# Patient Record
Sex: Male | Born: 1960
Health system: Southern US, Community
[De-identification: ages and names within clinical notes are randomized; demographics above are authoritative.]

## PROBLEM LIST (undated history)

## (undated) DIAGNOSIS — M1990 Unspecified osteoarthritis, unspecified site: Secondary | ICD-10-CM

## (undated) DIAGNOSIS — K219 Gastro-esophageal reflux disease without esophagitis: Secondary | ICD-10-CM

## (undated) DIAGNOSIS — E119 Type 2 diabetes mellitus without complications: Secondary | ICD-10-CM

## (undated) DIAGNOSIS — R7303 Prediabetes: Secondary | ICD-10-CM

## (undated) DIAGNOSIS — Z8701 Personal history of pneumonia (recurrent): Secondary | ICD-10-CM

## (undated) DIAGNOSIS — I10 Essential (primary) hypertension: Secondary | ICD-10-CM

## (undated) DIAGNOSIS — J189 Pneumonia, unspecified organism: Secondary | ICD-10-CM

## (undated) DIAGNOSIS — B1920 Unspecified viral hepatitis C without hepatic coma: Secondary | ICD-10-CM

## (undated) DIAGNOSIS — E039 Hypothyroidism, unspecified: Secondary | ICD-10-CM

## (undated) DIAGNOSIS — K449 Diaphragmatic hernia without obstruction or gangrene: Secondary | ICD-10-CM

## (undated) DIAGNOSIS — F1121 Opioid dependence, in remission: Secondary | ICD-10-CM

## (undated) DIAGNOSIS — I499 Cardiac arrhythmia, unspecified: Secondary | ICD-10-CM

## (undated) DIAGNOSIS — R011 Cardiac murmur, unspecified: Secondary | ICD-10-CM

## (undated) DIAGNOSIS — K7460 Unspecified cirrhosis of liver: Secondary | ICD-10-CM

## (undated) DIAGNOSIS — G629 Polyneuropathy, unspecified: Secondary | ICD-10-CM

## (undated) DIAGNOSIS — R0609 Other forms of dyspnea: Secondary | ICD-10-CM

## (undated) DIAGNOSIS — F1911 Other psychoactive substance abuse, in remission: Secondary | ICD-10-CM

## (undated) DIAGNOSIS — K7682 Hepatic encephalopathy: Secondary | ICD-10-CM

## (undated) DIAGNOSIS — F329 Major depressive disorder, single episode, unspecified: Secondary | ICD-10-CM

## (undated) DIAGNOSIS — K279 Peptic ulcer, site unspecified, unspecified as acute or chronic, without hemorrhage or perforation: Secondary | ICD-10-CM

## (undated) DIAGNOSIS — G709 Myoneural disorder, unspecified: Secondary | ICD-10-CM

## (undated) DIAGNOSIS — G894 Chronic pain syndrome: Secondary | ICD-10-CM

## (undated) DIAGNOSIS — F32A Depression, unspecified: Secondary | ICD-10-CM

## (undated) DIAGNOSIS — G4730 Sleep apnea, unspecified: Secondary | ICD-10-CM

## (undated) DIAGNOSIS — N281 Cyst of kidney, acquired: Secondary | ICD-10-CM

## (undated) DIAGNOSIS — Z9889 Other specified postprocedural states: Secondary | ICD-10-CM

## (undated) DIAGNOSIS — R161 Splenomegaly, not elsewhere classified: Secondary | ICD-10-CM

## (undated) DIAGNOSIS — R0600 Dyspnea, unspecified: Secondary | ICD-10-CM

## (undated) DIAGNOSIS — F4310 Post-traumatic stress disorder, unspecified: Secondary | ICD-10-CM

## (undated) DIAGNOSIS — Z789 Other specified health status: Secondary | ICD-10-CM

## (undated) DIAGNOSIS — F419 Anxiety disorder, unspecified: Secondary | ICD-10-CM

## (undated) DIAGNOSIS — K769 Liver disease, unspecified: Secondary | ICD-10-CM

## (undated) HISTORY — DX: Liver disease, unspecified: K76.9

## (undated) HISTORY — DX: Unspecified viral hepatitis C without hepatic coma: B19.20

## (undated) HISTORY — PX: OTHER SURGICAL HISTORY: SHX169

## (undated) HISTORY — DX: Personal history of pneumonia (recurrent): Z87.01

## (undated) HISTORY — DX: Other specified postprocedural states: Z98.890

## (undated) HISTORY — DX: Type 2 diabetes mellitus without complications: E11.9

## (undated) HISTORY — DX: Cardiac murmur, unspecified: R01.1

---

## 2002-03-10 ENCOUNTER — Emergency Department (HOSPITAL_COMMUNITY): Admission: EM | Admit: 2002-03-10 | Discharge: 2002-03-10 | Payer: Self-pay | Admitting: *Deleted

## 2002-03-18 ENCOUNTER — Ambulatory Visit (HOSPITAL_COMMUNITY): Admission: RE | Admit: 2002-03-18 | Discharge: 2002-03-18 | Payer: Self-pay | Admitting: Internal Medicine

## 2002-03-18 ENCOUNTER — Encounter: Payer: Self-pay | Admitting: Internal Medicine

## 2004-01-07 ENCOUNTER — Emergency Department (HOSPITAL_COMMUNITY): Admission: EM | Admit: 2004-01-07 | Discharge: 2004-01-07 | Payer: Self-pay | Admitting: Internal Medicine

## 2005-04-01 DIAGNOSIS — K219 Gastro-esophageal reflux disease without esophagitis: Secondary | ICD-10-CM | POA: Insufficient documentation

## 2005-04-04 ENCOUNTER — Ambulatory Visit: Payer: Self-pay | Admitting: Unknown Physician Specialty

## 2006-01-12 ENCOUNTER — Emergency Department: Payer: Self-pay | Admitting: General Practice

## 2006-02-21 ENCOUNTER — Ambulatory Visit: Payer: Self-pay | Admitting: Family Medicine

## 2006-05-16 ENCOUNTER — Other Ambulatory Visit: Payer: Self-pay

## 2006-05-16 ENCOUNTER — Inpatient Hospital Stay: Payer: Self-pay | Admitting: Internal Medicine

## 2006-05-29 ENCOUNTER — Ambulatory Visit: Payer: Self-pay | Admitting: Family Medicine

## 2006-06-02 ENCOUNTER — Ambulatory Visit: Payer: Self-pay | Admitting: Gastroenterology

## 2006-06-27 ENCOUNTER — Ambulatory Visit: Payer: Self-pay | Admitting: Cardiology

## 2007-06-10 DIAGNOSIS — G478 Other sleep disorders: Secondary | ICD-10-CM | POA: Insufficient documentation

## 2007-06-12 ENCOUNTER — Ambulatory Visit: Payer: Self-pay | Admitting: Family Medicine

## 2007-10-09 ENCOUNTER — Ambulatory Visit: Payer: Self-pay | Admitting: Family Medicine

## 2007-11-01 ENCOUNTER — Other Ambulatory Visit: Payer: Self-pay

## 2007-11-01 ENCOUNTER — Inpatient Hospital Stay: Payer: Self-pay | Admitting: Internal Medicine

## 2007-11-11 DIAGNOSIS — Z8701 Personal history of pneumonia (recurrent): Secondary | ICD-10-CM

## 2007-11-11 HISTORY — DX: Personal history of pneumonia (recurrent): Z87.01

## 2008-01-18 ENCOUNTER — Emergency Department: Payer: Self-pay | Admitting: Emergency Medicine

## 2008-01-22 ENCOUNTER — Ambulatory Visit: Payer: Self-pay | Admitting: Family Medicine

## 2008-02-24 ENCOUNTER — Ambulatory Visit: Payer: Self-pay

## 2008-07-10 ENCOUNTER — Other Ambulatory Visit: Payer: Self-pay

## 2008-07-10 ENCOUNTER — Observation Stay: Payer: Self-pay | Admitting: Internal Medicine

## 2008-08-11 ENCOUNTER — Encounter: Admission: RE | Admit: 2008-08-11 | Discharge: 2008-08-11 | Payer: Self-pay | Admitting: Neurology

## 2008-08-28 ENCOUNTER — Emergency Department: Payer: Self-pay | Admitting: Emergency Medicine

## 2008-08-28 ENCOUNTER — Other Ambulatory Visit: Payer: Self-pay

## 2008-12-25 ENCOUNTER — Emergency Department: Payer: Self-pay | Admitting: Emergency Medicine

## 2009-01-01 ENCOUNTER — Emergency Department: Payer: Self-pay | Admitting: Emergency Medicine

## 2009-02-16 ENCOUNTER — Ambulatory Visit: Payer: Self-pay | Admitting: Family Medicine

## 2009-02-22 ENCOUNTER — Ambulatory Visit: Payer: Self-pay | Admitting: Unknown Physician Specialty

## 2009-02-22 ENCOUNTER — Ambulatory Visit: Payer: Self-pay

## 2009-03-21 DIAGNOSIS — R197 Diarrhea, unspecified: Secondary | ICD-10-CM | POA: Insufficient documentation

## 2009-05-30 ENCOUNTER — Emergency Department: Payer: Self-pay | Admitting: Emergency Medicine

## 2010-06-14 ENCOUNTER — Ambulatory Visit: Payer: Self-pay | Admitting: Family Medicine

## 2010-07-12 ENCOUNTER — Emergency Department: Payer: Self-pay | Admitting: Emergency Medicine

## 2011-05-01 ENCOUNTER — Ambulatory Visit: Payer: Self-pay | Admitting: Family Medicine

## 2011-06-27 ENCOUNTER — Emergency Department: Payer: Self-pay

## 2011-06-29 ENCOUNTER — Emergency Department: Payer: Self-pay

## 2011-11-07 ENCOUNTER — Ambulatory Visit: Payer: Self-pay | Admitting: Family Medicine

## 2011-12-04 ENCOUNTER — Emergency Department: Payer: Self-pay | Admitting: Gastroenterology

## 2011-12-04 DIAGNOSIS — T17208A Unspecified foreign body in pharynx causing other injury, initial encounter: Secondary | ICD-10-CM | POA: Diagnosis not present

## 2011-12-04 DIAGNOSIS — R112 Nausea with vomiting, unspecified: Secondary | ICD-10-CM | POA: Diagnosis not present

## 2011-12-04 DIAGNOSIS — T18108A Unspecified foreign body in esophagus causing other injury, initial encounter: Secondary | ICD-10-CM | POA: Diagnosis not present

## 2011-12-04 DIAGNOSIS — R6889 Other general symptoms and signs: Secondary | ICD-10-CM | POA: Diagnosis not present

## 2011-12-04 LAB — COMPREHENSIVE METABOLIC PANEL
Albumin: 4.1 g/dL (ref 3.4–5.0)
Alkaline Phosphatase: 127 U/L (ref 50–136)
Anion Gap: 11 (ref 7–16)
BUN: 12 mg/dL (ref 7–18)
Bilirubin,Total: 0.6 mg/dL (ref 0.2–1.0)
Calcium, Total: 9 mg/dL (ref 8.5–10.1)
Chloride: 101 mmol/L (ref 98–107)
Co2: 26 mmol/L (ref 21–32)
Creatinine: 0.94 mg/dL (ref 0.60–1.30)
EGFR (African American): >60
EGFR (Non-African Amer.): >60
Glucose: 154 mg/dL — ABNORMAL HIGH (ref 65–99)
Osmolality: 279 (ref 275–301)
Potassium: 4.1 mmol/L (ref 3.5–5.1)
SGOT(AST): 107 U/L — ABNORMAL HIGH (ref 15–37)
SGPT (ALT): 115 U/L — ABNORMAL HIGH
Sodium: 138 mmol/L (ref 136–145)
Total Protein: 7.8 g/dL (ref 6.4–8.2)

## 2011-12-04 LAB — CBC
HCT: 42.3 % (ref 40.0–52.0)
HGB: 14.5 g/dL (ref 13.0–18.0)
MCH: 31.8 pg (ref 26.0–34.0)
MCHC: 34.4 g/dL (ref 32.0–36.0)
MCV: 92 fL (ref 80–100)
Platelet: 157 10*3/uL (ref 150–440)
RBC: 4.58 10*6/uL (ref 4.40–5.90)
RDW: 12.7 % (ref 11.5–14.5)
WBC: 6.1 10*3/uL (ref 3.8–10.6)

## 2011-12-06 DIAGNOSIS — E876 Hypokalemia: Secondary | ICD-10-CM | POA: Diagnosis not present

## 2011-12-06 DIAGNOSIS — R1310 Dysphagia, unspecified: Secondary | ICD-10-CM | POA: Diagnosis not present

## 2011-12-06 DIAGNOSIS — Z23 Encounter for immunization: Secondary | ICD-10-CM | POA: Diagnosis not present

## 2011-12-09 DIAGNOSIS — E876 Hypokalemia: Secondary | ICD-10-CM | POA: Diagnosis not present

## 2012-01-13 DIAGNOSIS — R1013 Epigastric pain: Secondary | ICD-10-CM | POA: Diagnosis not present

## 2012-01-13 DIAGNOSIS — R1011 Right upper quadrant pain: Secondary | ICD-10-CM | POA: Diagnosis not present

## 2012-01-13 DIAGNOSIS — R1310 Dysphagia, unspecified: Secondary | ICD-10-CM | POA: Diagnosis not present

## 2012-01-13 DIAGNOSIS — R1012 Left upper quadrant pain: Secondary | ICD-10-CM | POA: Diagnosis not present

## 2012-01-14 DIAGNOSIS — R1011 Right upper quadrant pain: Secondary | ICD-10-CM | POA: Diagnosis not present

## 2012-01-14 DIAGNOSIS — R634 Abnormal weight loss: Secondary | ICD-10-CM | POA: Diagnosis not present

## 2012-01-14 DIAGNOSIS — B182 Chronic viral hepatitis C: Secondary | ICD-10-CM | POA: Diagnosis not present

## 2012-01-14 DIAGNOSIS — R1310 Dysphagia, unspecified: Secondary | ICD-10-CM | POA: Diagnosis not present

## 2012-01-16 ENCOUNTER — Ambulatory Visit: Payer: Self-pay | Admitting: Gastroenterology

## 2012-01-16 DIAGNOSIS — R1011 Right upper quadrant pain: Secondary | ICD-10-CM | POA: Diagnosis not present

## 2012-01-16 DIAGNOSIS — R1310 Dysphagia, unspecified: Secondary | ICD-10-CM | POA: Diagnosis not present

## 2012-01-16 DIAGNOSIS — R1012 Left upper quadrant pain: Secondary | ICD-10-CM | POA: Diagnosis not present

## 2012-01-16 DIAGNOSIS — R109 Unspecified abdominal pain: Secondary | ICD-10-CM | POA: Diagnosis not present

## 2012-01-16 DIAGNOSIS — R1013 Epigastric pain: Secondary | ICD-10-CM | POA: Diagnosis not present

## 2012-01-16 DIAGNOSIS — R634 Abnormal weight loss: Secondary | ICD-10-CM | POA: Diagnosis not present

## 2012-01-16 DIAGNOSIS — B182 Chronic viral hepatitis C: Secondary | ICD-10-CM | POA: Diagnosis not present

## 2012-02-13 ENCOUNTER — Ambulatory Visit: Payer: Self-pay | Admitting: Gastroenterology

## 2012-02-13 DIAGNOSIS — Z8619 Personal history of other infectious and parasitic diseases: Secondary | ICD-10-CM | POA: Diagnosis not present

## 2012-02-13 DIAGNOSIS — G9009 Other idiopathic peripheral autonomic neuropathy: Secondary | ICD-10-CM | POA: Diagnosis not present

## 2012-02-13 DIAGNOSIS — R1310 Dysphagia, unspecified: Secondary | ICD-10-CM | POA: Diagnosis not present

## 2012-02-13 DIAGNOSIS — Z801 Family history of malignant neoplasm of trachea, bronchus and lung: Secondary | ICD-10-CM | POA: Diagnosis not present

## 2012-02-13 DIAGNOSIS — Z79899 Other long term (current) drug therapy: Secondary | ICD-10-CM | POA: Diagnosis not present

## 2012-02-13 DIAGNOSIS — R079 Chest pain, unspecified: Secondary | ICD-10-CM | POA: Diagnosis not present

## 2012-02-13 DIAGNOSIS — R7309 Other abnormal glucose: Secondary | ICD-10-CM | POA: Diagnosis not present

## 2012-02-13 DIAGNOSIS — R1011 Right upper quadrant pain: Secondary | ICD-10-CM | POA: Diagnosis not present

## 2012-02-13 DIAGNOSIS — R1084 Generalized abdominal pain: Secondary | ICD-10-CM | POA: Diagnosis not present

## 2012-02-13 DIAGNOSIS — I10 Essential (primary) hypertension: Secondary | ICD-10-CM | POA: Diagnosis not present

## 2012-02-13 DIAGNOSIS — R1012 Left upper quadrant pain: Secondary | ICD-10-CM | POA: Diagnosis not present

## 2012-02-13 DIAGNOSIS — K219 Gastro-esophageal reflux disease without esophagitis: Secondary | ICD-10-CM | POA: Diagnosis not present

## 2012-02-13 DIAGNOSIS — K222 Esophageal obstruction: Secondary | ICD-10-CM | POA: Diagnosis not present

## 2012-02-18 ENCOUNTER — Ambulatory Visit: Payer: Self-pay | Admitting: Gastroenterology

## 2012-02-18 DIAGNOSIS — E119 Type 2 diabetes mellitus without complications: Secondary | ICD-10-CM | POA: Diagnosis not present

## 2012-02-18 DIAGNOSIS — R1310 Dysphagia, unspecified: Secondary | ICD-10-CM | POA: Diagnosis not present

## 2012-02-18 DIAGNOSIS — M5000 Cervical disc disorder with myelopathy, unspecified cervical region: Secondary | ICD-10-CM | POA: Diagnosis not present

## 2012-02-18 DIAGNOSIS — J309 Allergic rhinitis, unspecified: Secondary | ICD-10-CM | POA: Diagnosis not present

## 2012-02-18 DIAGNOSIS — B182 Chronic viral hepatitis C: Secondary | ICD-10-CM | POA: Diagnosis not present

## 2012-02-18 DIAGNOSIS — Z801 Family history of malignant neoplasm of trachea, bronchus and lung: Secondary | ICD-10-CM | POA: Diagnosis not present

## 2012-02-18 DIAGNOSIS — G9009 Other idiopathic peripheral autonomic neuropathy: Secondary | ICD-10-CM | POA: Diagnosis not present

## 2012-02-18 DIAGNOSIS — B1920 Unspecified viral hepatitis C without hepatic coma: Secondary | ICD-10-CM | POA: Diagnosis not present

## 2012-02-18 DIAGNOSIS — M5106 Intervertebral disc disorders with myelopathy, lumbar region: Secondary | ICD-10-CM | POA: Diagnosis not present

## 2012-02-18 DIAGNOSIS — K222 Esophageal obstruction: Secondary | ICD-10-CM | POA: Diagnosis not present

## 2012-02-18 DIAGNOSIS — R634 Abnormal weight loss: Secondary | ICD-10-CM | POA: Diagnosis not present

## 2012-02-18 DIAGNOSIS — G4700 Insomnia, unspecified: Secondary | ICD-10-CM | POA: Diagnosis not present

## 2012-02-18 DIAGNOSIS — G894 Chronic pain syndrome: Secondary | ICD-10-CM | POA: Diagnosis not present

## 2012-02-18 DIAGNOSIS — I10 Essential (primary) hypertension: Secondary | ICD-10-CM | POA: Diagnosis not present

## 2012-02-18 LAB — PROTIME-INR
INR: 1
Prothrombin Time: 13.4 secs (ref 11.5–14.7)

## 2012-02-18 LAB — PLATELET COUNT: Platelet: 152 10*3/uL (ref 150–440)

## 2012-02-18 LAB — APTT: Activated PTT: 29.6 secs (ref 23.6–35.9)

## 2012-02-25 LAB — PATHOLOGY REPORT

## 2012-03-03 DIAGNOSIS — F319 Bipolar disorder, unspecified: Secondary | ICD-10-CM | POA: Diagnosis not present

## 2012-03-10 DIAGNOSIS — M549 Dorsalgia, unspecified: Secondary | ICD-10-CM | POA: Diagnosis not present

## 2012-03-10 DIAGNOSIS — Z23 Encounter for immunization: Secondary | ICD-10-CM | POA: Diagnosis not present

## 2012-03-10 DIAGNOSIS — K5289 Other specified noninfective gastroenteritis and colitis: Secondary | ICD-10-CM | POA: Diagnosis not present

## 2012-05-22 ENCOUNTER — Ambulatory Visit: Payer: Self-pay | Admitting: Family Medicine

## 2012-05-22 DIAGNOSIS — Z23 Encounter for immunization: Secondary | ICD-10-CM | POA: Diagnosis not present

## 2012-05-22 DIAGNOSIS — L821 Other seborrheic keratosis: Secondary | ICD-10-CM | POA: Diagnosis not present

## 2012-05-22 DIAGNOSIS — R634 Abnormal weight loss: Secondary | ICD-10-CM | POA: Diagnosis not present

## 2012-05-22 LAB — COMPREHENSIVE METABOLIC PANEL
Albumin: 4.1 g/dL (ref 3.4–5.0)
Alkaline Phosphatase: 134 U/L (ref 50–136)
Anion Gap: 9 (ref 7–16)
BUN: 11 mg/dL (ref 7–18)
Calcium, Total: 9.1 mg/dL (ref 8.5–10.1)
Chloride: 104 mmol/L (ref 98–107)
Co2: 24 mmol/L (ref 21–32)
Creatinine: 1.05 mg/dL (ref 0.60–1.30)
Potassium: 3.8 mmol/L (ref 3.5–5.1)
SGOT(AST): 62 U/L — ABNORMAL HIGH (ref 15–37)
SGPT (ALT): 63 U/L
Sodium: 137 mmol/L (ref 136–145)

## 2012-05-22 LAB — CBC WITH DIFFERENTIAL/PLATELET
Eosinophil %: 1.8 %
HCT: 42 % (ref 40.0–52.0)
HGB: 14.6 g/dL (ref 13.0–18.0)
Lymphocyte %: 36.7 %
Monocyte #: 0.4 x10 3/mm (ref 0.2–1.0)
Monocyte %: 6 %
Neutrophil %: 54.6 %
Platelet: 159 10*3/uL (ref 150–440)
RBC: 4.72 10*6/uL (ref 4.40–5.90)
RDW: 13 % (ref 11.5–14.5)

## 2012-05-22 LAB — TSH: Thyroid Stimulating Horm: 2.12 u[IU]/mL

## 2012-06-11 ENCOUNTER — Ambulatory Visit: Payer: Self-pay | Admitting: Family Medicine

## 2012-06-11 DIAGNOSIS — M25569 Pain in unspecified knee: Secondary | ICD-10-CM | POA: Diagnosis not present

## 2012-06-11 DIAGNOSIS — G894 Chronic pain syndrome: Secondary | ICD-10-CM | POA: Diagnosis not present

## 2012-06-11 DIAGNOSIS — K4090 Unilateral inguinal hernia, without obstruction or gangrene, not specified as recurrent: Secondary | ICD-10-CM | POA: Diagnosis not present

## 2012-06-11 DIAGNOSIS — Z23 Encounter for immunization: Secondary | ICD-10-CM | POA: Diagnosis not present

## 2012-07-09 DIAGNOSIS — M2240 Chondromalacia patellae, unspecified knee: Secondary | ICD-10-CM | POA: Diagnosis not present

## 2012-07-22 DIAGNOSIS — K4090 Unilateral inguinal hernia, without obstruction or gangrene, not specified as recurrent: Secondary | ICD-10-CM | POA: Diagnosis not present

## 2012-07-30 ENCOUNTER — Ambulatory Visit: Payer: Self-pay | Admitting: General Surgery

## 2012-07-30 DIAGNOSIS — Z01812 Encounter for preprocedural laboratory examination: Secondary | ICD-10-CM | POA: Diagnosis not present

## 2012-07-30 DIAGNOSIS — K4090 Unilateral inguinal hernia, without obstruction or gangrene, not specified as recurrent: Secondary | ICD-10-CM | POA: Diagnosis not present

## 2012-07-30 LAB — COMPREHENSIVE METABOLIC PANEL
Albumin: 3.5 g/dL (ref 3.4–5.0)
Alkaline Phosphatase: 150 U/L — ABNORMAL HIGH (ref 50–136)
Anion Gap: 4 — ABNORMAL LOW (ref 7–16)
BUN: 8 mg/dL (ref 7–18)
Bilirubin,Total: 0.4 mg/dL (ref 0.2–1.0)
Calcium, Total: 8.8 mg/dL (ref 8.5–10.1)
Chloride: 103 mmol/L (ref 98–107)
Co2: 30 mmol/L (ref 21–32)
Creatinine: 0.8 mg/dL (ref 0.60–1.30)
EGFR (African American): >60
EGFR (Non-African Amer.): >60
Glucose: 117 mg/dL — ABNORMAL HIGH (ref 65–99)
Osmolality: 273 (ref 275–301)
Potassium: 4.1 mmol/L (ref 3.5–5.1)
SGOT(AST): 102 U/L — ABNORMAL HIGH (ref 15–37)
SGPT (ALT): 91 U/L — ABNORMAL HIGH (ref 12–78)
Sodium: 137 mmol/L (ref 136–145)
Total Protein: 7.5 g/dL (ref 6.4–8.2)

## 2012-07-30 LAB — CBC WITH DIFFERENTIAL/PLATELET
Basophil #: 0 10*3/uL (ref 0.0–0.1)
Basophil %: 1.1 %
Eosinophil #: 0.2 10*3/uL (ref 0.0–0.7)
Eosinophil %: 5.1 %
HCT: 39.3 % — ABNORMAL LOW (ref 40.0–52.0)
HGB: 13.9 g/dL (ref 13.0–18.0)
Lymphocyte #: 1.6 10*3/uL (ref 1.0–3.6)
Lymphocyte %: 39.3 %
MCH: 31.9 pg (ref 26.0–34.0)
MCHC: 35.5 g/dL (ref 32.0–36.0)
MCV: 90 fL (ref 80–100)
Monocyte #: 0.4 x10 3/mm (ref 0.2–1.0)
Monocyte %: 9.9 %
Neutrophil #: 1.8 10*3/uL (ref 1.4–6.5)
Neutrophil %: 44.6 %
Platelet: 125 10*3/uL — ABNORMAL LOW (ref 150–440)
RBC: 4.36 10*6/uL — ABNORMAL LOW (ref 4.40–5.90)
RDW: 13 % (ref 11.5–14.5)
WBC: 4.1 10*3/uL (ref 3.8–10.6)

## 2012-08-02 HISTORY — PX: HERNIA REPAIR: SHX51

## 2012-08-06 ENCOUNTER — Ambulatory Visit: Payer: Self-pay | Admitting: General Surgery

## 2012-08-06 DIAGNOSIS — G43909 Migraine, unspecified, not intractable, without status migrainosus: Secondary | ICD-10-CM | POA: Diagnosis not present

## 2012-08-06 DIAGNOSIS — R160 Hepatomegaly, not elsewhere classified: Secondary | ICD-10-CM | POA: Diagnosis not present

## 2012-08-06 DIAGNOSIS — R000 Tachycardia, unspecified: Secondary | ICD-10-CM | POA: Diagnosis not present

## 2012-08-06 DIAGNOSIS — M129 Arthropathy, unspecified: Secondary | ICD-10-CM | POA: Diagnosis not present

## 2012-08-06 DIAGNOSIS — Z79899 Other long term (current) drug therapy: Secondary | ICD-10-CM | POA: Diagnosis not present

## 2012-08-06 DIAGNOSIS — K4090 Unilateral inguinal hernia, without obstruction or gangrene, not specified as recurrent: Secondary | ICD-10-CM | POA: Diagnosis not present

## 2012-08-06 DIAGNOSIS — K219 Gastro-esophageal reflux disease without esophagitis: Secondary | ICD-10-CM | POA: Diagnosis not present

## 2012-08-06 DIAGNOSIS — E119 Type 2 diabetes mellitus without complications: Secondary | ICD-10-CM | POA: Diagnosis not present

## 2012-08-06 DIAGNOSIS — IMO0002 Reserved for concepts with insufficient information to code with codable children: Secondary | ICD-10-CM | POA: Diagnosis not present

## 2012-08-06 DIAGNOSIS — I10 Essential (primary) hypertension: Secondary | ICD-10-CM | POA: Diagnosis not present

## 2012-08-06 DIAGNOSIS — G609 Hereditary and idiopathic neuropathy, unspecified: Secondary | ICD-10-CM | POA: Diagnosis not present

## 2012-08-06 DIAGNOSIS — Z8619 Personal history of other infectious and parasitic diseases: Secondary | ICD-10-CM | POA: Diagnosis not present

## 2012-08-06 DIAGNOSIS — D176 Benign lipomatous neoplasm of spermatic cord: Secondary | ICD-10-CM | POA: Diagnosis not present

## 2012-08-12 DIAGNOSIS — Z23 Encounter for immunization: Secondary | ICD-10-CM | POA: Diagnosis not present

## 2012-08-12 DIAGNOSIS — M25569 Pain in unspecified knee: Secondary | ICD-10-CM | POA: Diagnosis not present

## 2012-08-12 DIAGNOSIS — K4090 Unilateral inguinal hernia, without obstruction or gangrene, not specified as recurrent: Secondary | ICD-10-CM | POA: Diagnosis not present

## 2012-08-12 DIAGNOSIS — G894 Chronic pain syndrome: Secondary | ICD-10-CM | POA: Diagnosis not present

## 2012-09-02 DIAGNOSIS — K4090 Unilateral inguinal hernia, without obstruction or gangrene, not specified as recurrent: Secondary | ICD-10-CM | POA: Diagnosis not present

## 2012-09-02 DIAGNOSIS — R109 Unspecified abdominal pain: Secondary | ICD-10-CM | POA: Diagnosis not present

## 2012-09-02 DIAGNOSIS — M25569 Pain in unspecified knee: Secondary | ICD-10-CM | POA: Diagnosis not present

## 2012-09-02 DIAGNOSIS — Z23 Encounter for immunization: Secondary | ICD-10-CM | POA: Diagnosis not present

## 2012-10-13 DIAGNOSIS — Z23 Encounter for immunization: Secondary | ICD-10-CM | POA: Diagnosis not present

## 2012-10-13 DIAGNOSIS — M519 Unspecified thoracic, thoracolumbar and lumbosacral intervertebral disc disorder: Secondary | ICD-10-CM | POA: Diagnosis not present

## 2012-10-13 DIAGNOSIS — R109 Unspecified abdominal pain: Secondary | ICD-10-CM | POA: Diagnosis not present

## 2012-10-13 DIAGNOSIS — M25569 Pain in unspecified knee: Secondary | ICD-10-CM | POA: Diagnosis not present

## 2013-01-26 DIAGNOSIS — K4090 Unilateral inguinal hernia, without obstruction or gangrene, not specified as recurrent: Secondary | ICD-10-CM | POA: Diagnosis not present

## 2013-01-26 DIAGNOSIS — R945 Abnormal results of liver function studies: Secondary | ICD-10-CM | POA: Diagnosis not present

## 2013-01-26 DIAGNOSIS — E119 Type 2 diabetes mellitus without complications: Secondary | ICD-10-CM | POA: Diagnosis not present

## 2013-01-26 DIAGNOSIS — R7309 Other abnormal glucose: Secondary | ICD-10-CM | POA: Diagnosis not present

## 2013-01-26 DIAGNOSIS — I10 Essential (primary) hypertension: Secondary | ICD-10-CM | POA: Diagnosis not present

## 2013-02-10 DIAGNOSIS — K4091 Unilateral inguinal hernia, without obstruction or gangrene, recurrent: Secondary | ICD-10-CM | POA: Diagnosis not present

## 2013-04-19 DIAGNOSIS — R5383 Other fatigue: Secondary | ICD-10-CM | POA: Diagnosis not present

## 2013-04-19 DIAGNOSIS — R5381 Other malaise: Secondary | ICD-10-CM | POA: Diagnosis not present

## 2013-04-19 DIAGNOSIS — R945 Abnormal results of liver function studies: Secondary | ICD-10-CM | POA: Diagnosis not present

## 2013-04-19 DIAGNOSIS — T148 Other injury of unspecified body region: Secondary | ICD-10-CM | POA: Diagnosis not present

## 2013-04-19 DIAGNOSIS — K4090 Unilateral inguinal hernia, without obstruction or gangrene, not specified as recurrent: Secondary | ICD-10-CM | POA: Diagnosis not present

## 2013-04-19 DIAGNOSIS — W57XXXA Bitten or stung by nonvenomous insect and other nonvenomous arthropods, initial encounter: Secondary | ICD-10-CM | POA: Diagnosis not present

## 2013-05-10 ENCOUNTER — Ambulatory Visit: Payer: Self-pay | Admitting: Family Medicine

## 2013-05-10 DIAGNOSIS — M959 Acquired deformity of musculoskeletal system, unspecified: Secondary | ICD-10-CM | POA: Diagnosis not present

## 2013-05-10 DIAGNOSIS — R0602 Shortness of breath: Secondary | ICD-10-CM | POA: Diagnosis not present

## 2013-05-10 DIAGNOSIS — T148 Other injury of unspecified body region: Secondary | ICD-10-CM | POA: Diagnosis not present

## 2013-05-10 DIAGNOSIS — R61 Generalized hyperhidrosis: Secondary | ICD-10-CM | POA: Diagnosis not present

## 2013-05-10 DIAGNOSIS — R069 Unspecified abnormalities of breathing: Secondary | ICD-10-CM | POA: Diagnosis not present

## 2013-08-06 ENCOUNTER — Emergency Department: Payer: Self-pay | Admitting: Emergency Medicine

## 2013-08-09 ENCOUNTER — Observation Stay: Payer: Self-pay | Admitting: Internal Medicine

## 2013-08-09 DIAGNOSIS — R634 Abnormal weight loss: Secondary | ICD-10-CM | POA: Diagnosis not present

## 2013-08-09 DIAGNOSIS — K7290 Hepatic failure, unspecified without coma: Secondary | ICD-10-CM | POA: Diagnosis not present

## 2013-08-09 DIAGNOSIS — Z79899 Other long term (current) drug therapy: Secondary | ICD-10-CM | POA: Diagnosis not present

## 2013-08-09 DIAGNOSIS — E876 Hypokalemia: Secondary | ICD-10-CM | POA: Diagnosis not present

## 2013-08-09 DIAGNOSIS — R4182 Altered mental status, unspecified: Secondary | ICD-10-CM | POA: Diagnosis not present

## 2013-08-09 DIAGNOSIS — IMO0002 Reserved for concepts with insufficient information to code with codable children: Secondary | ICD-10-CM | POA: Diagnosis not present

## 2013-08-09 DIAGNOSIS — R45851 Suicidal ideations: Secondary | ICD-10-CM | POA: Diagnosis not present

## 2013-08-09 DIAGNOSIS — R404 Transient alteration of awareness: Secondary | ICD-10-CM | POA: Diagnosis not present

## 2013-08-09 DIAGNOSIS — F29 Unspecified psychosis not due to a substance or known physiological condition: Secondary | ICD-10-CM | POA: Diagnosis not present

## 2013-08-09 DIAGNOSIS — F331 Major depressive disorder, recurrent, moderate: Secondary | ICD-10-CM | POA: Diagnosis not present

## 2013-08-09 DIAGNOSIS — E119 Type 2 diabetes mellitus without complications: Secondary | ICD-10-CM | POA: Diagnosis not present

## 2013-08-09 DIAGNOSIS — K7460 Unspecified cirrhosis of liver: Secondary | ICD-10-CM | POA: Diagnosis not present

## 2013-08-09 DIAGNOSIS — G8929 Other chronic pain: Secondary | ICD-10-CM | POA: Diagnosis not present

## 2013-08-09 DIAGNOSIS — I10 Essential (primary) hypertension: Secondary | ICD-10-CM | POA: Diagnosis not present

## 2013-08-09 DIAGNOSIS — Z9119 Patient's noncompliance with other medical treatment and regimen: Secondary | ICD-10-CM | POA: Diagnosis not present

## 2013-08-09 DIAGNOSIS — B182 Chronic viral hepatitis C: Secondary | ICD-10-CM | POA: Diagnosis not present

## 2013-08-09 LAB — URINALYSIS, COMPLETE
Bilirubin,UR: NEGATIVE
Glucose,UR: NEGATIVE mg/dL (ref 0–75)
Hyaline Cast: 5
Ketone: NEGATIVE
Leukocyte Esterase: NEGATIVE
Nitrite: NEGATIVE
Ph: 5 (ref 4.5–8.0)
Specific Gravity: 1.024 (ref 1.003–1.030)
WBC UR: 2 /HPF (ref 0–5)

## 2013-08-09 LAB — COMPREHENSIVE METABOLIC PANEL
Albumin: 3.7 g/dL (ref 3.4–5.0)
Alkaline Phosphatase: 155 U/L — ABNORMAL HIGH (ref 50–136)
BUN: 17 mg/dL (ref 7–18)
Bilirubin,Total: 0.7 mg/dL (ref 0.2–1.0)
Calcium, Total: 8.7 mg/dL (ref 8.5–10.1)
EGFR (Non-African Amer.): >60
Glucose: 117 mg/dL — ABNORMAL HIGH (ref 65–99)
Osmolality: 273 (ref 275–301)
SGOT(AST): 85 U/L — ABNORMAL HIGH (ref 15–37)
SGPT (ALT): 77 U/L (ref 12–78)
Total Protein: 8.3 g/dL — ABNORMAL HIGH (ref 6.4–8.2)

## 2013-08-09 LAB — CBC
HCT: 44.6 % (ref 40.0–52.0)
MCH: 30.9 pg (ref 26.0–34.0)
MCHC: 34.9 g/dL (ref 32.0–36.0)
Platelet: 191 10*3/uL (ref 150–440)
RBC: 5.03 10*6/uL (ref 4.40–5.90)
WBC: 8 10*3/uL (ref 3.8–10.6)

## 2013-08-09 LAB — TROPONIN I: Troponin-I: <0.02 ng/mL

## 2013-08-10 DIAGNOSIS — G8929 Other chronic pain: Secondary | ICD-10-CM | POA: Diagnosis not present

## 2013-08-10 DIAGNOSIS — E876 Hypokalemia: Secondary | ICD-10-CM | POA: Diagnosis not present

## 2013-08-10 DIAGNOSIS — F331 Major depressive disorder, recurrent, moderate: Secondary | ICD-10-CM | POA: Diagnosis not present

## 2013-08-10 DIAGNOSIS — K7290 Hepatic failure, unspecified without coma: Secondary | ICD-10-CM | POA: Diagnosis not present

## 2013-08-10 DIAGNOSIS — I10 Essential (primary) hypertension: Secondary | ICD-10-CM | POA: Diagnosis not present

## 2013-08-10 LAB — CBC WITH DIFFERENTIAL/PLATELET
HGB: 14.4 g/dL (ref 13.0–18.0)
Lymphocyte %: 50.3 %
MCH: 31 pg (ref 26.0–34.0)
MCHC: 34.7 g/dL (ref 32.0–36.0)
Monocyte #: 0.5 x10 3/mm (ref 0.2–1.0)
Monocyte %: 7.9 %
Neutrophil #: 2 10*3/uL (ref 1.4–6.5)
Neutrophil %: 33.6 %
RBC: 4.65 10*6/uL (ref 4.40–5.90)
RDW: 13.2 % (ref 11.5–14.5)
WBC: 6 10*3/uL (ref 3.8–10.6)

## 2013-08-10 LAB — BASIC METABOLIC PANEL
Anion Gap: 8 (ref 7–16)
BUN: 12 mg/dL (ref 7–18)
Calcium, Total: 8.4 mg/dL — ABNORMAL LOW (ref 8.5–10.1)
Co2: 23 mmol/L (ref 21–32)
Osmolality: 274 (ref 275–301)
Potassium: 3.8 mmol/L (ref 3.5–5.1)

## 2013-08-20 DIAGNOSIS — K7290 Hepatic failure, unspecified without coma: Secondary | ICD-10-CM | POA: Diagnosis not present

## 2013-08-20 DIAGNOSIS — R069 Unspecified abnormalities of breathing: Secondary | ICD-10-CM | POA: Diagnosis not present

## 2013-09-30 ENCOUNTER — Ambulatory Visit: Payer: Self-pay | Admitting: Family Medicine

## 2013-09-30 DIAGNOSIS — R069 Unspecified abnormalities of breathing: Secondary | ICD-10-CM | POA: Diagnosis not present

## 2013-09-30 DIAGNOSIS — R109 Unspecified abdominal pain: Secondary | ICD-10-CM | POA: Diagnosis not present

## 2013-10-19 DIAGNOSIS — R109 Unspecified abdominal pain: Secondary | ICD-10-CM | POA: Diagnosis not present

## 2013-10-19 DIAGNOSIS — R069 Unspecified abnormalities of breathing: Secondary | ICD-10-CM | POA: Diagnosis not present

## 2013-10-26 DIAGNOSIS — F319 Bipolar disorder, unspecified: Secondary | ICD-10-CM | POA: Diagnosis not present

## 2013-11-11 ENCOUNTER — Ambulatory Visit: Payer: Self-pay | Admitting: Family Medicine

## 2013-11-11 DIAGNOSIS — Q619 Cystic kidney disease, unspecified: Secondary | ICD-10-CM | POA: Diagnosis not present

## 2013-11-11 DIAGNOSIS — R109 Unspecified abdominal pain: Secondary | ICD-10-CM | POA: Diagnosis not present

## 2013-11-11 DIAGNOSIS — N281 Cyst of kidney, acquired: Secondary | ICD-10-CM | POA: Diagnosis not present

## 2013-11-11 DIAGNOSIS — R161 Splenomegaly, not elsewhere classified: Secondary | ICD-10-CM | POA: Diagnosis not present

## 2013-11-23 DIAGNOSIS — F319 Bipolar disorder, unspecified: Secondary | ICD-10-CM | POA: Diagnosis not present

## 2013-12-17 DIAGNOSIS — J019 Acute sinusitis, unspecified: Secondary | ICD-10-CM | POA: Diagnosis not present

## 2013-12-17 DIAGNOSIS — M2550 Pain in unspecified joint: Secondary | ICD-10-CM | POA: Diagnosis not present

## 2013-12-17 DIAGNOSIS — Z23 Encounter for immunization: Secondary | ICD-10-CM | POA: Diagnosis not present

## 2013-12-17 DIAGNOSIS — R069 Unspecified abnormalities of breathing: Secondary | ICD-10-CM | POA: Diagnosis not present

## 2014-01-28 ENCOUNTER — Inpatient Hospital Stay: Payer: Self-pay | Admitting: Student

## 2014-01-28 DIAGNOSIS — R609 Edema, unspecified: Secondary | ICD-10-CM | POA: Diagnosis not present

## 2014-01-28 DIAGNOSIS — R262 Difficulty in walking, not elsewhere classified: Secondary | ICD-10-CM | POA: Diagnosis not present

## 2014-01-28 DIAGNOSIS — R4182 Altered mental status, unspecified: Secondary | ICD-10-CM | POA: Diagnosis not present

## 2014-01-28 DIAGNOSIS — F329 Major depressive disorder, single episode, unspecified: Secondary | ICD-10-CM | POA: Diagnosis not present

## 2014-01-28 DIAGNOSIS — K7460 Unspecified cirrhosis of liver: Secondary | ICD-10-CM | POA: Diagnosis not present

## 2014-01-28 DIAGNOSIS — R059 Cough, unspecified: Secondary | ICD-10-CM | POA: Diagnosis not present

## 2014-01-28 DIAGNOSIS — R0602 Shortness of breath: Secondary | ICD-10-CM | POA: Diagnosis not present

## 2014-01-28 DIAGNOSIS — R188 Other ascites: Secondary | ICD-10-CM | POA: Diagnosis present

## 2014-01-28 DIAGNOSIS — R634 Abnormal weight loss: Secondary | ICD-10-CM | POA: Diagnosis present

## 2014-01-28 DIAGNOSIS — F200 Paranoid schizophrenia: Secondary | ICD-10-CM | POA: Diagnosis present

## 2014-01-28 DIAGNOSIS — R509 Fever, unspecified: Secondary | ICD-10-CM | POA: Diagnosis not present

## 2014-01-28 DIAGNOSIS — J8409 Other alveolar and parieto-alveolar conditions: Secondary | ICD-10-CM | POA: Diagnosis not present

## 2014-01-28 DIAGNOSIS — IMO0002 Reserved for concepts with insufficient information to code with codable children: Secondary | ICD-10-CM | POA: Diagnosis present

## 2014-01-28 DIAGNOSIS — I10 Essential (primary) hypertension: Secondary | ICD-10-CM | POA: Diagnosis not present

## 2014-01-28 DIAGNOSIS — J189 Pneumonia, unspecified organism: Secondary | ICD-10-CM | POA: Diagnosis not present

## 2014-01-28 DIAGNOSIS — F3289 Other specified depressive episodes: Secondary | ICD-10-CM | POA: Diagnosis not present

## 2014-01-28 DIAGNOSIS — F4310 Post-traumatic stress disorder, unspecified: Secondary | ICD-10-CM | POA: Diagnosis not present

## 2014-01-28 DIAGNOSIS — I509 Heart failure, unspecified: Secondary | ICD-10-CM | POA: Diagnosis not present

## 2014-01-28 DIAGNOSIS — B182 Chronic viral hepatitis C: Secondary | ICD-10-CM | POA: Diagnosis not present

## 2014-01-28 DIAGNOSIS — Z6829 Body mass index (BMI) 29.0-29.9, adult: Secondary | ICD-10-CM | POA: Diagnosis not present

## 2014-01-28 DIAGNOSIS — R05 Cough: Secondary | ICD-10-CM | POA: Diagnosis not present

## 2014-01-28 DIAGNOSIS — G8929 Other chronic pain: Secondary | ICD-10-CM | POA: Diagnosis present

## 2014-01-28 DIAGNOSIS — R079 Chest pain, unspecified: Secondary | ICD-10-CM | POA: Diagnosis not present

## 2014-01-28 DIAGNOSIS — R638 Other symptoms and signs concerning food and fluid intake: Secondary | ICD-10-CM | POA: Diagnosis not present

## 2014-01-28 DIAGNOSIS — E119 Type 2 diabetes mellitus without complications: Secondary | ICD-10-CM | POA: Diagnosis present

## 2014-01-28 DIAGNOSIS — F333 Major depressive disorder, recurrent, severe with psychotic symptoms: Secondary | ICD-10-CM | POA: Diagnosis not present

## 2014-01-28 LAB — HEPATIC FUNCTION PANEL A (ARMC)
AST: 58 U/L — AB (ref 15–37)
Albumin: 3.5 g/dL (ref 3.4–5.0)
Alkaline Phosphatase: 169 U/L — ABNORMAL HIGH
BILIRUBIN DIRECT: 0.4 mg/dL — AB (ref 0.00–0.20)
Bilirubin,Total: 1 mg/dL (ref 0.2–1.0)
SGPT (ALT): 69 U/L (ref 12–78)
TOTAL PROTEIN: 8.1 g/dL (ref 6.4–8.2)

## 2014-01-28 LAB — CBC
HCT: 37.6 % — AB (ref 40.0–52.0)
HGB: 13.2 g/dL (ref 13.0–18.0)
MCH: 31.5 pg (ref 26.0–34.0)
MCHC: 35.1 g/dL (ref 32.0–36.0)
MCV: 90 fL (ref 80–100)
PLATELETS: 108 10*3/uL — AB (ref 150–440)
RBC: 4.19 10*6/uL — AB (ref 4.40–5.90)
RDW: 13.6 % (ref 11.5–14.5)
WBC: 9.2 10*3/uL (ref 3.8–10.6)

## 2014-01-28 LAB — TROPONIN I

## 2014-01-28 LAB — BASIC METABOLIC PANEL
ANION GAP: 6 — AB (ref 7–16)
BUN: 15 mg/dL (ref 7–18)
CALCIUM: 8.7 mg/dL (ref 8.5–10.1)
Chloride: 102 mmol/L (ref 98–107)
Co2: 26 mmol/L (ref 21–32)
Creatinine: 0.88 mg/dL (ref 0.60–1.30)
EGFR (African American): >60
EGFR (Non-African Amer.): >60
Glucose: 159 mg/dL — ABNORMAL HIGH (ref 65–99)
Osmolality: 272 (ref 275–301)
POTASSIUM: 4 mmol/L (ref 3.5–5.1)
Sodium: 134 mmol/L — ABNORMAL LOW (ref 136–145)

## 2014-01-28 LAB — RAPID INFLUENZA A&B ANTIGENS (ARMC ONLY)

## 2014-01-28 LAB — AMMONIA: Ammonia, Plasma: 39 mcmol/L — ABNORMAL HIGH (ref 11–32)

## 2014-01-28 LAB — PRO B NATRIURETIC PEPTIDE: B-Type Natriuretic Peptide: 57 pg/mL (ref 0–125)

## 2014-01-29 LAB — CBC WITH DIFFERENTIAL/PLATELET
BASOS PCT: 0.5 %
Basophil #: 0 10*3/uL (ref 0.0–0.1)
EOS PCT: 3.9 %
Eosinophil #: 0.3 10*3/uL (ref 0.0–0.7)
HCT: 35.7 % — AB (ref 40.0–52.0)
HGB: 12.1 g/dL — AB (ref 13.0–18.0)
LYMPHS ABS: 2.4 10*3/uL (ref 1.0–3.6)
LYMPHS PCT: 36.8 %
MCH: 30.1 pg (ref 26.0–34.0)
MCHC: 33.8 g/dL (ref 32.0–36.0)
MCV: 89 fL (ref 80–100)
MONO ABS: 0.5 x10 3/mm (ref 0.2–1.0)
Monocyte %: 8 %
NEUTROS ABS: 3.4 10*3/uL (ref 1.4–6.5)
Neutrophil %: 50.8 %
Platelet: 138 10*3/uL — ABNORMAL LOW (ref 150–440)
RBC: 4 10*6/uL — ABNORMAL LOW (ref 4.40–5.90)
RDW: 13.1 % (ref 11.5–14.5)
WBC: 6.6 10*3/uL (ref 3.8–10.6)

## 2014-01-29 LAB — BASIC METABOLIC PANEL
ANION GAP: 6 — AB (ref 7–16)
BUN: 9 mg/dL (ref 7–18)
CALCIUM: 8.5 mg/dL (ref 8.5–10.1)
Chloride: 103 mmol/L (ref 98–107)
Co2: 28 mmol/L (ref 21–32)
Creatinine: 0.86 mg/dL (ref 0.60–1.30)
EGFR (African American): >60
EGFR (Non-African Amer.): >60
Glucose: 97 mg/dL (ref 65–99)
Osmolality: 272 (ref 275–301)
Potassium: 3.5 mmol/L (ref 3.5–5.1)
Sodium: 137 mmol/L (ref 136–145)

## 2014-02-02 ENCOUNTER — Inpatient Hospital Stay: Payer: Self-pay | Admitting: Internal Medicine

## 2014-02-02 DIAGNOSIS — K7290 Hepatic failure, unspecified without coma: Secondary | ICD-10-CM | POA: Diagnosis not present

## 2014-02-02 DIAGNOSIS — R404 Transient alteration of awareness: Secondary | ICD-10-CM | POA: Diagnosis not present

## 2014-02-02 DIAGNOSIS — J9819 Other pulmonary collapse: Secondary | ICD-10-CM | POA: Diagnosis not present

## 2014-02-02 DIAGNOSIS — R4789 Other speech disturbances: Secondary | ICD-10-CM | POA: Diagnosis not present

## 2014-02-02 DIAGNOSIS — R269 Unspecified abnormalities of gait and mobility: Secondary | ICD-10-CM | POA: Diagnosis not present

## 2014-02-02 DIAGNOSIS — I10 Essential (primary) hypertension: Secondary | ICD-10-CM | POA: Diagnosis not present

## 2014-02-02 DIAGNOSIS — IMO0002 Reserved for concepts with insufficient information to code with codable children: Secondary | ICD-10-CM | POA: Diagnosis not present

## 2014-02-02 DIAGNOSIS — E876 Hypokalemia: Secondary | ICD-10-CM | POA: Diagnosis not present

## 2014-02-02 DIAGNOSIS — K7682 Hepatic encephalopathy: Secondary | ICD-10-CM | POA: Diagnosis not present

## 2014-02-02 DIAGNOSIS — K7460 Unspecified cirrhosis of liver: Secondary | ICD-10-CM | POA: Diagnosis not present

## 2014-02-02 DIAGNOSIS — E860 Dehydration: Secondary | ICD-10-CM | POA: Diagnosis not present

## 2014-02-02 LAB — COMPREHENSIVE METABOLIC PANEL
ALBUMIN: 3.5 g/dL (ref 3.4–5.0)
Alkaline Phosphatase: 216 U/L — ABNORMAL HIGH
Anion Gap: 3 — ABNORMAL LOW (ref 7–16)
BUN: 12 mg/dL (ref 7–18)
Bilirubin,Total: 0.4 mg/dL (ref 0.2–1.0)
CO2: 33 mmol/L — AB (ref 21–32)
CREATININE: 0.93 mg/dL (ref 0.60–1.30)
Calcium, Total: 8.5 mg/dL (ref 8.5–10.1)
Chloride: 98 mmol/L (ref 98–107)
EGFR (African American): >60
EGFR (Non-African Amer.): >60
GLUCOSE: 106 mg/dL — AB (ref 65–99)
OSMOLALITY: 268 (ref 275–301)
Potassium: 3.4 mmol/L — ABNORMAL LOW (ref 3.5–5.1)
SGOT(AST): 88 U/L — ABNORMAL HIGH (ref 15–37)
SGPT (ALT): 74 U/L (ref 12–78)
SODIUM: 134 mmol/L — AB (ref 136–145)
Total Protein: 7.7 g/dL (ref 6.4–8.2)

## 2014-02-02 LAB — CULTURE, BLOOD (SINGLE)

## 2014-02-02 LAB — CBC WITH DIFFERENTIAL/PLATELET
Basophil #: 0.1 10*3/uL (ref 0.0–0.1)
Basophil %: 1.1 %
EOS PCT: 6.4 %
Eosinophil #: 0.3 10*3/uL (ref 0.0–0.7)
HCT: 37.9 % — ABNORMAL LOW (ref 40.0–52.0)
HGB: 12.8 g/dL — ABNORMAL LOW (ref 13.0–18.0)
LYMPHS ABS: 2 10*3/uL (ref 1.0–3.6)
Lymphocyte %: 39.2 %
MCH: 30.2 pg (ref 26.0–34.0)
MCHC: 33.7 g/dL (ref 32.0–36.0)
MCV: 90 fL (ref 80–100)
MONO ABS: 0.6 x10 3/mm (ref 0.2–1.0)
Monocyte %: 11.8 %
NEUTROS PCT: 41.5 %
Neutrophil #: 2.1 10*3/uL (ref 1.4–6.5)
PLATELETS: 158 10*3/uL (ref 150–440)
RBC: 4.23 10*6/uL — ABNORMAL LOW (ref 4.40–5.90)
RDW: 13.3 % (ref 11.5–14.5)
WBC: 5 10*3/uL (ref 3.8–10.6)

## 2014-02-02 LAB — URINALYSIS, COMPLETE
BILIRUBIN, UR: NEGATIVE
BLOOD: NEGATIVE
Bacteria: NONE SEEN
Glucose,UR: NEGATIVE mg/dL (ref 0–75)
KETONE: NEGATIVE
Leukocyte Esterase: NEGATIVE
Nitrite: NEGATIVE
PROTEIN: NEGATIVE
Ph: 5 (ref 4.5–8.0)
RBC,UR: <1 /HPF (ref 0–5)
SPECIFIC GRAVITY: 1.012 (ref 1.003–1.030)
Squamous Epithelial: NONE SEEN
WBC UR: 1 /HPF (ref 0–5)

## 2014-02-02 LAB — AMMONIA: Ammonia, Plasma: 36 mcmol/L — ABNORMAL HIGH (ref 11–32)

## 2014-02-03 DIAGNOSIS — K7290 Hepatic failure, unspecified without coma: Secondary | ICD-10-CM | POA: Diagnosis not present

## 2014-02-03 DIAGNOSIS — K7682 Hepatic encephalopathy: Secondary | ICD-10-CM | POA: Diagnosis not present

## 2014-02-03 DIAGNOSIS — K7460 Unspecified cirrhosis of liver: Secondary | ICD-10-CM | POA: Diagnosis not present

## 2014-02-03 DIAGNOSIS — E860 Dehydration: Secondary | ICD-10-CM | POA: Diagnosis not present

## 2014-02-03 DIAGNOSIS — E876 Hypokalemia: Secondary | ICD-10-CM | POA: Diagnosis not present

## 2014-02-03 LAB — BASIC METABOLIC PANEL
Anion Gap: 3 — ABNORMAL LOW (ref 7–16)
BUN: 8 mg/dL (ref 7–18)
CALCIUM: 8.6 mg/dL (ref 8.5–10.1)
CHLORIDE: 103 mmol/L (ref 98–107)
Co2: 31 mmol/L (ref 21–32)
Creatinine: 0.91 mg/dL (ref 0.60–1.30)
EGFR (African American): >60
Glucose: 109 mg/dL — ABNORMAL HIGH (ref 65–99)
Osmolality: 273 (ref 275–301)
Potassium: 3.8 mmol/L (ref 3.5–5.1)
Sodium: 137 mmol/L (ref 136–145)

## 2014-02-03 LAB — CBC WITH DIFFERENTIAL/PLATELET
Basophil #: 0 10*3/uL (ref 0.0–0.1)
Basophil %: 0.4 %
Eosinophil #: 0.4 10*3/uL (ref 0.0–0.7)
Eosinophil %: 9.4 %
HCT: 37.8 % — ABNORMAL LOW (ref 40.0–52.0)
HGB: 13.3 g/dL (ref 13.0–18.0)
Lymphocyte #: 2.2 10*3/uL (ref 1.0–3.6)
Lymphocyte %: 49.2 %
MCH: 31.6 pg (ref 26.0–34.0)
MCHC: 35.3 g/dL (ref 32.0–36.0)
MCV: 89 fL (ref 80–100)
MONOS PCT: 9.4 %
Monocyte #: 0.4 x10 3/mm (ref 0.2–1.0)
NEUTROS ABS: 1.4 10*3/uL (ref 1.4–6.5)
NEUTROS PCT: 31.6 %
PLATELETS: 138 10*3/uL — AB (ref 150–440)
RBC: 4.23 10*6/uL — AB (ref 4.40–5.90)
RDW: 13.4 % (ref 11.5–14.5)
WBC: 4.5 10*3/uL (ref 3.8–10.6)

## 2014-02-03 LAB — AMMONIA: AMMONIA, PLASMA: 34 umol/L — AB (ref 11–32)

## 2014-02-03 LAB — MAGNESIUM: Magnesium: 2.2 mg/dL

## 2014-02-08 DIAGNOSIS — M25559 Pain in unspecified hip: Secondary | ICD-10-CM | POA: Diagnosis not present

## 2014-02-08 DIAGNOSIS — M6530 Trigger finger, unspecified finger: Secondary | ICD-10-CM | POA: Diagnosis not present

## 2014-02-08 DIAGNOSIS — M545 Low back pain, unspecified: Secondary | ICD-10-CM | POA: Diagnosis not present

## 2014-02-08 DIAGNOSIS — M25549 Pain in joints of unspecified hand: Secondary | ICD-10-CM | POA: Diagnosis not present

## 2014-02-09 DIAGNOSIS — K7682 Hepatic encephalopathy: Secondary | ICD-10-CM | POA: Diagnosis not present

## 2014-02-09 DIAGNOSIS — J189 Pneumonia, unspecified organism: Secondary | ICD-10-CM | POA: Diagnosis not present

## 2014-02-09 DIAGNOSIS — K7290 Hepatic failure, unspecified without coma: Secondary | ICD-10-CM | POA: Diagnosis not present

## 2014-02-09 DIAGNOSIS — Z23 Encounter for immunization: Secondary | ICD-10-CM | POA: Diagnosis not present

## 2014-02-15 DIAGNOSIS — M519 Unspecified thoracic, thoracolumbar and lumbosacral intervertebral disc disorder: Secondary | ICD-10-CM | POA: Diagnosis not present

## 2014-02-15 DIAGNOSIS — J069 Acute upper respiratory infection, unspecified: Secondary | ICD-10-CM | POA: Diagnosis not present

## 2014-02-15 DIAGNOSIS — R609 Edema, unspecified: Secondary | ICD-10-CM | POA: Diagnosis not present

## 2014-02-21 DIAGNOSIS — K7290 Hepatic failure, unspecified without coma: Secondary | ICD-10-CM | POA: Diagnosis not present

## 2014-02-21 DIAGNOSIS — K7460 Unspecified cirrhosis of liver: Secondary | ICD-10-CM | POA: Diagnosis not present

## 2014-02-21 DIAGNOSIS — K7682 Hepatic encephalopathy: Secondary | ICD-10-CM | POA: Diagnosis not present

## 2014-03-28 DIAGNOSIS — R1310 Dysphagia, unspecified: Secondary | ICD-10-CM | POA: Diagnosis not present

## 2014-03-28 DIAGNOSIS — B182 Chronic viral hepatitis C: Secondary | ICD-10-CM | POA: Diagnosis not present

## 2014-03-28 DIAGNOSIS — M519 Unspecified thoracic, thoracolumbar and lumbosacral intervertebral disc disorder: Secondary | ICD-10-CM | POA: Diagnosis not present

## 2014-04-26 ENCOUNTER — Ambulatory Visit: Payer: Self-pay | Admitting: Family Medicine

## 2014-04-26 DIAGNOSIS — G4700 Insomnia, unspecified: Secondary | ICD-10-CM | POA: Diagnosis not present

## 2014-04-26 DIAGNOSIS — K7682 Hepatic encephalopathy: Secondary | ICD-10-CM | POA: Diagnosis not present

## 2014-04-26 DIAGNOSIS — R609 Edema, unspecified: Secondary | ICD-10-CM | POA: Diagnosis not present

## 2014-04-26 DIAGNOSIS — R0609 Other forms of dyspnea: Secondary | ICD-10-CM | POA: Diagnosis not present

## 2014-04-26 DIAGNOSIS — R0602 Shortness of breath: Secondary | ICD-10-CM | POA: Diagnosis not present

## 2014-04-26 DIAGNOSIS — K7290 Hepatic failure, unspecified without coma: Secondary | ICD-10-CM | POA: Diagnosis not present

## 2014-04-26 DIAGNOSIS — R0989 Other specified symptoms and signs involving the circulatory and respiratory systems: Secondary | ICD-10-CM | POA: Diagnosis not present

## 2014-04-26 LAB — CBC WITH DIFFERENTIAL/PLATELET
BASOS ABS: 0.1 10*3/uL (ref 0.0–0.1)
Basophil %: 1.2 %
EOS PCT: 1.8 %
Eosinophil #: 0.1 10*3/uL (ref 0.0–0.7)
HCT: 46.1 % (ref 40.0–52.0)
HGB: 15.5 g/dL (ref 13.0–18.0)
Lymphocyte #: 2.1 10*3/uL (ref 1.0–3.6)
Lymphocyte %: 36.3 %
MCH: 30.2 pg (ref 26.0–34.0)
MCHC: 33.5 g/dL (ref 32.0–36.0)
MCV: 90 fL (ref 80–100)
Monocyte #: 0.4 x10 3/mm (ref 0.2–1.0)
Monocyte %: 6.9 %
NEUTROS PCT: 53.8 %
Neutrophil #: 3.1 10*3/uL (ref 1.4–6.5)
PLATELETS: 129 10*3/uL — AB (ref 150–440)
RBC: 5.11 10*6/uL (ref 4.40–5.90)
RDW: 13.8 % (ref 11.5–14.5)
WBC: 5.7 10*3/uL (ref 3.8–10.6)

## 2014-04-26 LAB — COMPREHENSIVE METABOLIC PANEL
ALT: 133 U/L — AB (ref 12–78)
Albumin: 3.7 g/dL (ref 3.4–5.0)
Alkaline Phosphatase: 245 U/L — ABNORMAL HIGH
Anion Gap: 9 (ref 7–16)
BUN: 9 mg/dL (ref 7–18)
Bilirubin,Total: 0.8 mg/dL (ref 0.2–1.0)
CALCIUM: 9.2 mg/dL (ref 8.5–10.1)
CO2: 25 mmol/L (ref 21–32)
Chloride: 103 mmol/L (ref 98–107)
Creatinine: 0.72 mg/dL (ref 0.60–1.30)
EGFR (African American): >60
EGFR (Non-African Amer.): >60
GLUCOSE: 114 mg/dL — AB (ref 65–99)
OSMOLALITY: 273 (ref 275–301)
Potassium: 4 mmol/L (ref 3.5–5.1)
SGOT(AST): 186 U/L — ABNORMAL HIGH (ref 15–37)
Sodium: 137 mmol/L (ref 136–145)
Total Protein: 8.4 g/dL — ABNORMAL HIGH (ref 6.4–8.2)

## 2014-04-26 LAB — AMMONIA: Ammonia, Plasma: 31 mcmol/L (ref 11–32)

## 2014-05-02 DIAGNOSIS — K7682 Hepatic encephalopathy: Secondary | ICD-10-CM | POA: Insufficient documentation

## 2014-05-02 DIAGNOSIS — K7290 Hepatic failure, unspecified without coma: Secondary | ICD-10-CM | POA: Insufficient documentation

## 2014-05-02 DIAGNOSIS — F29 Unspecified psychosis not due to a substance or known physiological condition: Secondary | ICD-10-CM | POA: Diagnosis not present

## 2014-05-02 DIAGNOSIS — R1310 Dysphagia, unspecified: Secondary | ICD-10-CM | POA: Diagnosis not present

## 2014-05-06 DIAGNOSIS — R0989 Other specified symptoms and signs involving the circulatory and respiratory systems: Secondary | ICD-10-CM | POA: Diagnosis not present

## 2014-05-06 DIAGNOSIS — R0609 Other forms of dyspnea: Secondary | ICD-10-CM | POA: Diagnosis not present

## 2014-05-06 DIAGNOSIS — K7682 Hepatic encephalopathy: Secondary | ICD-10-CM | POA: Diagnosis not present

## 2014-05-06 DIAGNOSIS — R1013 Epigastric pain: Secondary | ICD-10-CM | POA: Diagnosis not present

## 2014-05-06 DIAGNOSIS — G4700 Insomnia, unspecified: Secondary | ICD-10-CM | POA: Diagnosis not present

## 2014-05-06 DIAGNOSIS — K7290 Hepatic failure, unspecified without coma: Secondary | ICD-10-CM | POA: Diagnosis not present

## 2014-05-06 DIAGNOSIS — R635 Abnormal weight gain: Secondary | ICD-10-CM | POA: Diagnosis not present

## 2014-05-12 ENCOUNTER — Ambulatory Visit: Payer: Self-pay | Admitting: Gastroenterology

## 2014-05-12 DIAGNOSIS — E669 Obesity, unspecified: Secondary | ICD-10-CM | POA: Diagnosis not present

## 2014-05-12 DIAGNOSIS — Z886 Allergy status to analgesic agent status: Secondary | ICD-10-CM | POA: Diagnosis not present

## 2014-05-12 DIAGNOSIS — Z885 Allergy status to narcotic agent status: Secondary | ICD-10-CM | POA: Diagnosis not present

## 2014-05-12 DIAGNOSIS — B1920 Unspecified viral hepatitis C without hepatic coma: Secondary | ICD-10-CM | POA: Diagnosis not present

## 2014-05-12 DIAGNOSIS — Z79899 Other long term (current) drug therapy: Secondary | ICD-10-CM | POA: Diagnosis not present

## 2014-05-12 DIAGNOSIS — K222 Esophageal obstruction: Secondary | ICD-10-CM | POA: Diagnosis not present

## 2014-05-12 DIAGNOSIS — Z6830 Body mass index (BMI) 30.0-30.9, adult: Secondary | ICD-10-CM | POA: Diagnosis not present

## 2014-05-12 DIAGNOSIS — R1310 Dysphagia, unspecified: Secondary | ICD-10-CM | POA: Diagnosis not present

## 2014-05-12 DIAGNOSIS — K7460 Unspecified cirrhosis of liver: Secondary | ICD-10-CM | POA: Diagnosis not present

## 2014-06-07 DIAGNOSIS — R7989 Other specified abnormal findings of blood chemistry: Secondary | ICD-10-CM | POA: Diagnosis not present

## 2014-06-14 DIAGNOSIS — R51 Headache: Secondary | ICD-10-CM | POA: Diagnosis not present

## 2014-06-14 DIAGNOSIS — L989 Disorder of the skin and subcutaneous tissue, unspecified: Secondary | ICD-10-CM | POA: Diagnosis not present

## 2014-06-14 DIAGNOSIS — K7682 Hepatic encephalopathy: Secondary | ICD-10-CM | POA: Diagnosis not present

## 2014-06-14 DIAGNOSIS — K7290 Hepatic failure, unspecified without coma: Secondary | ICD-10-CM | POA: Diagnosis not present

## 2014-06-14 DIAGNOSIS — G4700 Insomnia, unspecified: Secondary | ICD-10-CM | POA: Diagnosis not present

## 2014-07-08 DIAGNOSIS — R52 Pain, unspecified: Secondary | ICD-10-CM | POA: Diagnosis not present

## 2014-07-08 DIAGNOSIS — W11XXXA Fall on and from ladder, initial encounter: Secondary | ICD-10-CM | POA: Diagnosis not present

## 2014-07-09 ENCOUNTER — Inpatient Hospital Stay: Payer: Self-pay | Admitting: Internal Medicine

## 2014-07-09 DIAGNOSIS — R5383 Other fatigue: Secondary | ICD-10-CM | POA: Diagnosis not present

## 2014-07-09 DIAGNOSIS — F3289 Other specified depressive episodes: Secondary | ICD-10-CM | POA: Diagnosis present

## 2014-07-09 DIAGNOSIS — B1920 Unspecified viral hepatitis C without hepatic coma: Secondary | ICD-10-CM | POA: Diagnosis not present

## 2014-07-09 DIAGNOSIS — Z9181 History of falling: Secondary | ICD-10-CM | POA: Diagnosis not present

## 2014-07-09 DIAGNOSIS — K5289 Other specified noninfective gastroenteritis and colitis: Secondary | ICD-10-CM | POA: Diagnosis not present

## 2014-07-09 DIAGNOSIS — R51 Headache: Secondary | ICD-10-CM | POA: Diagnosis not present

## 2014-07-09 DIAGNOSIS — S0990XA Unspecified injury of head, initial encounter: Secondary | ICD-10-CM | POA: Diagnosis not present

## 2014-07-09 DIAGNOSIS — I951 Orthostatic hypotension: Secondary | ICD-10-CM | POA: Diagnosis not present

## 2014-07-09 DIAGNOSIS — Z87891 Personal history of nicotine dependence: Secondary | ICD-10-CM | POA: Diagnosis not present

## 2014-07-09 DIAGNOSIS — R197 Diarrhea, unspecified: Secondary | ICD-10-CM | POA: Diagnosis not present

## 2014-07-09 DIAGNOSIS — K7460 Unspecified cirrhosis of liver: Secondary | ICD-10-CM | POA: Diagnosis not present

## 2014-07-09 DIAGNOSIS — G894 Chronic pain syndrome: Secondary | ICD-10-CM | POA: Diagnosis not present

## 2014-07-09 DIAGNOSIS — R5381 Other malaise: Secondary | ICD-10-CM | POA: Diagnosis not present

## 2014-07-09 DIAGNOSIS — R112 Nausea with vomiting, unspecified: Secondary | ICD-10-CM | POA: Diagnosis not present

## 2014-07-09 DIAGNOSIS — F329 Major depressive disorder, single episode, unspecified: Secondary | ICD-10-CM | POA: Diagnosis present

## 2014-07-09 DIAGNOSIS — I10 Essential (primary) hypertension: Secondary | ICD-10-CM | POA: Diagnosis not present

## 2014-07-09 LAB — COMPREHENSIVE METABOLIC PANEL
AST: 102 U/L — AB (ref 15–37)
Albumin: 3.6 g/dL (ref 3.4–5.0)
Alkaline Phosphatase: 149 U/L — ABNORMAL HIGH
Anion Gap: 12 (ref 7–16)
BILIRUBIN TOTAL: 1.1 mg/dL — AB (ref 0.2–1.0)
BUN: 7 mg/dL (ref 7–18)
CO2: 24 mmol/L (ref 21–32)
Calcium, Total: 9 mg/dL (ref 8.5–10.1)
Chloride: 106 mmol/L (ref 98–107)
Creatinine: 0.73 mg/dL (ref 0.60–1.30)
EGFR (African American): >60
EGFR (Non-African Amer.): >60
Glucose: 138 mg/dL — ABNORMAL HIGH (ref 65–99)
Osmolality: 283 (ref 275–301)
POTASSIUM: 2.8 mmol/L — AB (ref 3.5–5.1)
SGPT (ALT): 90 U/L — ABNORMAL HIGH
Sodium: 142 mmol/L (ref 136–145)
Total Protein: 7.9 g/dL (ref 6.4–8.2)

## 2014-07-09 LAB — DIFFERENTIAL
Basophil #: 0 10*3/uL (ref 0.0–0.1)
Basophil %: 0.4 %
EOS PCT: 0.5 %
Eosinophil #: 0 10*3/uL (ref 0.0–0.7)
LYMPHS ABS: 1.7 10*3/uL (ref 1.0–3.6)
Lymphocyte %: 36.7 %
MONO ABS: 0.3 x10 3/mm (ref 0.2–1.0)
Monocyte %: 7.1 %
NEUTROS PCT: 55.3 %
Neutrophil #: 2.6 10*3/uL (ref 1.4–6.5)

## 2014-07-09 LAB — URINALYSIS, COMPLETE
BLOOD: NEGATIVE
Bacteria: NONE SEEN
Bilirubin,UR: NEGATIVE
Glucose,UR: NEGATIVE mg/dL (ref 0–75)
KETONE: NEGATIVE
Leukocyte Esterase: NEGATIVE
Nitrite: NEGATIVE
PH: 6 (ref 4.5–8.0)
PROTEIN: NEGATIVE
SQUAMOUS EPITHELIAL: NONE SEEN
Specific Gravity: 1.003 (ref 1.003–1.030)
WBC UR: NONE SEEN /HPF (ref 0–5)

## 2014-07-09 LAB — CBC
HCT: 40 % (ref 40.0–52.0)
HGB: 13.8 g/dL (ref 13.0–18.0)
MCH: 31.5 pg (ref 26.0–34.0)
MCHC: 34.6 g/dL (ref 32.0–36.0)
MCV: 91 fL (ref 80–100)
PLATELETS: 116 10*3/uL — AB (ref 150–440)
RBC: 4.38 10*6/uL — ABNORMAL LOW (ref 4.40–5.90)
RDW: 13.5 % (ref 11.5–14.5)
WBC: 4.7 10*3/uL (ref 3.8–10.6)

## 2014-07-09 LAB — AMMONIA
AMMONIA, PLASMA: 20 umol/L (ref 11–32)
AMMONIA, PLASMA: 29 umol/L (ref 11–32)

## 2014-07-09 LAB — DRUG SCREEN, URINE
Amphetamines, Ur Screen: NEGATIVE (ref ?–1000)
Barbiturates, Ur Screen: NEGATIVE (ref ?–200)
Benzodiazepine, Ur Scrn: NEGATIVE (ref ?–200)
Cannabinoid 50 Ng, Ur ~~LOC~~: POSITIVE (ref ?–50)
Cocaine Metabolite,Ur ~~LOC~~: NEGATIVE (ref ?–300)
MDMA (Ecstasy)Ur Screen: NEGATIVE (ref ?–500)
Methadone, Ur Screen: NEGATIVE (ref ?–300)
OPIATE, UR SCREEN: NEGATIVE (ref ?–300)
PHENCYCLIDINE (PCP) UR S: NEGATIVE (ref ?–25)
TRICYCLIC, UR SCREEN: NEGATIVE (ref ?–1000)

## 2014-07-09 LAB — TSH: THYROID STIMULATING HORM: 0.617 u[IU]/mL

## 2014-07-09 LAB — TROPONIN I

## 2014-07-09 LAB — CLOSTRIDIUM DIFFICILE(ARMC)

## 2014-07-10 LAB — BASIC METABOLIC PANEL
Anion Gap: 6 — ABNORMAL LOW (ref 7–16)
BUN: 6 mg/dL — AB (ref 7–18)
Calcium, Total: 7.5 mg/dL — ABNORMAL LOW (ref 8.5–10.1)
Chloride: 110 mmol/L — ABNORMAL HIGH (ref 98–107)
Co2: 26 mmol/L (ref 21–32)
Creatinine: 0.79 mg/dL (ref 0.60–1.30)
EGFR (African American): >60
EGFR (Non-African Amer.): >60
Glucose: 105 mg/dL — ABNORMAL HIGH (ref 65–99)
Osmolality: 281 (ref 275–301)
POTASSIUM: 3.7 mmol/L (ref 3.5–5.1)
SODIUM: 142 mmol/L (ref 136–145)

## 2014-07-11 ENCOUNTER — Other Ambulatory Visit: Payer: Self-pay | Admitting: Family Medicine

## 2014-07-11 DIAGNOSIS — R112 Nausea with vomiting, unspecified: Secondary | ICD-10-CM | POA: Diagnosis not present

## 2014-07-11 LAB — CBC WITH DIFFERENTIAL/PLATELET
Basophil #: 0.1 10*3/uL (ref 0.0–0.1)
Basophil %: 0.9 %
Eosinophil #: 0.1 10*3/uL (ref 0.0–0.7)
Eosinophil %: 0.8 %
HCT: 46.6 % (ref 40.0–52.0)
HGB: 15.7 g/dL (ref 13.0–18.0)
LYMPHS ABS: 2 10*3/uL (ref 1.0–3.6)
Lymphocyte %: 26.6 %
MCH: 30.7 pg (ref 26.0–34.0)
MCHC: 33.7 g/dL (ref 32.0–36.0)
MCV: 91 fL (ref 80–100)
Monocyte #: 0.5 x10 3/mm (ref 0.2–1.0)
Monocyte %: 7 %
NEUTROS PCT: 64.7 %
Neutrophil #: 4.8 10*3/uL (ref 1.4–6.5)
Platelet: 168 10*3/uL (ref 150–440)
RBC: 5.11 10*6/uL (ref 4.40–5.90)
RDW: 13.5 % (ref 11.5–14.5)
WBC: 7.4 10*3/uL (ref 3.8–10.6)

## 2014-07-11 LAB — COMPREHENSIVE METABOLIC PANEL
ALK PHOS: 150 U/L — AB
AST: 126 U/L — AB (ref 15–37)
Albumin: 4 g/dL (ref 3.4–5.0)
Anion Gap: 10 (ref 7–16)
BUN: 9 mg/dL (ref 7–18)
Bilirubin,Total: 1.1 mg/dL — ABNORMAL HIGH (ref 0.2–1.0)
CALCIUM: 9.4 mg/dL (ref 8.5–10.1)
CHLORIDE: 106 mmol/L (ref 98–107)
Co2: 24 mmol/L (ref 21–32)
Creatinine: 0.94 mg/dL (ref 0.60–1.30)
EGFR (Non-African Amer.): >60
Glucose: 117 mg/dL — ABNORMAL HIGH (ref 65–99)
Osmolality: 279 (ref 275–301)
Potassium: 3.8 mmol/L (ref 3.5–5.1)
SGPT (ALT): 107 U/L — ABNORMAL HIGH
Sodium: 140 mmol/L (ref 136–145)
TOTAL PROTEIN: 9 g/dL — AB (ref 6.4–8.2)

## 2014-07-13 DIAGNOSIS — K7460 Unspecified cirrhosis of liver: Secondary | ICD-10-CM | POA: Diagnosis not present

## 2014-07-13 DIAGNOSIS — B1920 Unspecified viral hepatitis C without hepatic coma: Secondary | ICD-10-CM | POA: Diagnosis not present

## 2014-07-13 DIAGNOSIS — R51 Headache: Secondary | ICD-10-CM | POA: Diagnosis not present

## 2014-07-13 DIAGNOSIS — IMO0001 Reserved for inherently not codable concepts without codable children: Secondary | ICD-10-CM | POA: Diagnosis not present

## 2014-07-13 DIAGNOSIS — I4892 Unspecified atrial flutter: Secondary | ICD-10-CM | POA: Diagnosis not present

## 2014-07-13 DIAGNOSIS — G894 Chronic pain syndrome: Secondary | ICD-10-CM | POA: Diagnosis not present

## 2014-07-13 DIAGNOSIS — G4700 Insomnia, unspecified: Secondary | ICD-10-CM | POA: Diagnosis not present

## 2014-07-13 DIAGNOSIS — K7682 Hepatic encephalopathy: Secondary | ICD-10-CM | POA: Diagnosis not present

## 2014-07-13 DIAGNOSIS — K7290 Hepatic failure, unspecified without coma: Secondary | ICD-10-CM | POA: Diagnosis not present

## 2014-07-13 DIAGNOSIS — K5289 Other specified noninfective gastroenteritis and colitis: Secondary | ICD-10-CM | POA: Diagnosis not present

## 2014-07-13 DIAGNOSIS — I10 Essential (primary) hypertension: Secondary | ICD-10-CM | POA: Diagnosis not present

## 2014-07-19 ENCOUNTER — Ambulatory Visit: Payer: Self-pay | Admitting: Family Medicine

## 2014-07-19 DIAGNOSIS — I4892 Unspecified atrial flutter: Secondary | ICD-10-CM | POA: Diagnosis not present

## 2014-07-19 DIAGNOSIS — I359 Nonrheumatic aortic valve disorder, unspecified: Secondary | ICD-10-CM | POA: Diagnosis not present

## 2014-07-20 DIAGNOSIS — K7460 Unspecified cirrhosis of liver: Secondary | ICD-10-CM | POA: Diagnosis not present

## 2014-07-20 DIAGNOSIS — B1920 Unspecified viral hepatitis C without hepatic coma: Secondary | ICD-10-CM | POA: Diagnosis not present

## 2014-07-20 DIAGNOSIS — IMO0001 Reserved for inherently not codable concepts without codable children: Secondary | ICD-10-CM | POA: Diagnosis not present

## 2014-07-20 DIAGNOSIS — G894 Chronic pain syndrome: Secondary | ICD-10-CM | POA: Diagnosis not present

## 2014-07-20 DIAGNOSIS — K5289 Other specified noninfective gastroenteritis and colitis: Secondary | ICD-10-CM | POA: Diagnosis not present

## 2014-07-20 DIAGNOSIS — I10 Essential (primary) hypertension: Secondary | ICD-10-CM | POA: Diagnosis not present

## 2014-07-22 DIAGNOSIS — G4700 Insomnia, unspecified: Secondary | ICD-10-CM | POA: Diagnosis not present

## 2014-07-22 DIAGNOSIS — I4891 Unspecified atrial fibrillation: Secondary | ICD-10-CM | POA: Diagnosis not present

## 2014-07-22 DIAGNOSIS — K7682 Hepatic encephalopathy: Secondary | ICD-10-CM | POA: Diagnosis not present

## 2014-07-22 DIAGNOSIS — F3289 Other specified depressive episodes: Secondary | ICD-10-CM | POA: Diagnosis not present

## 2014-07-22 DIAGNOSIS — F329 Major depressive disorder, single episode, unspecified: Secondary | ICD-10-CM | POA: Diagnosis not present

## 2014-07-22 DIAGNOSIS — K7290 Hepatic failure, unspecified without coma: Secondary | ICD-10-CM | POA: Diagnosis not present

## 2014-07-26 DIAGNOSIS — IMO0001 Reserved for inherently not codable concepts without codable children: Secondary | ICD-10-CM | POA: Diagnosis not present

## 2014-07-26 DIAGNOSIS — K7460 Unspecified cirrhosis of liver: Secondary | ICD-10-CM | POA: Diagnosis not present

## 2014-07-26 DIAGNOSIS — B1920 Unspecified viral hepatitis C without hepatic coma: Secondary | ICD-10-CM | POA: Diagnosis not present

## 2014-07-26 DIAGNOSIS — K5289 Other specified noninfective gastroenteritis and colitis: Secondary | ICD-10-CM | POA: Diagnosis not present

## 2014-07-27 DIAGNOSIS — B1920 Unspecified viral hepatitis C without hepatic coma: Secondary | ICD-10-CM | POA: Diagnosis not present

## 2014-07-27 DIAGNOSIS — IMO0001 Reserved for inherently not codable concepts without codable children: Secondary | ICD-10-CM | POA: Diagnosis not present

## 2014-07-27 DIAGNOSIS — G894 Chronic pain syndrome: Secondary | ICD-10-CM | POA: Diagnosis not present

## 2014-07-27 DIAGNOSIS — I10 Essential (primary) hypertension: Secondary | ICD-10-CM | POA: Diagnosis not present

## 2014-07-27 DIAGNOSIS — K7460 Unspecified cirrhosis of liver: Secondary | ICD-10-CM | POA: Diagnosis not present

## 2014-07-27 DIAGNOSIS — K5289 Other specified noninfective gastroenteritis and colitis: Secondary | ICD-10-CM | POA: Diagnosis not present

## 2014-07-28 DIAGNOSIS — K5289 Other specified noninfective gastroenteritis and colitis: Secondary | ICD-10-CM | POA: Diagnosis not present

## 2014-07-28 DIAGNOSIS — B1920 Unspecified viral hepatitis C without hepatic coma: Secondary | ICD-10-CM | POA: Diagnosis not present

## 2014-07-28 DIAGNOSIS — IMO0001 Reserved for inherently not codable concepts without codable children: Secondary | ICD-10-CM | POA: Diagnosis not present

## 2014-07-28 DIAGNOSIS — I10 Essential (primary) hypertension: Secondary | ICD-10-CM | POA: Diagnosis not present

## 2014-07-28 DIAGNOSIS — K7460 Unspecified cirrhosis of liver: Secondary | ICD-10-CM | POA: Diagnosis not present

## 2014-07-28 DIAGNOSIS — G894 Chronic pain syndrome: Secondary | ICD-10-CM | POA: Diagnosis not present

## 2014-08-10 DIAGNOSIS — K5289 Other specified noninfective gastroenteritis and colitis: Secondary | ICD-10-CM | POA: Diagnosis not present

## 2014-08-10 DIAGNOSIS — I4891 Unspecified atrial fibrillation: Secondary | ICD-10-CM | POA: Diagnosis not present

## 2014-08-10 DIAGNOSIS — G9340 Encephalopathy, unspecified: Secondary | ICD-10-CM | POA: Diagnosis not present

## 2014-08-10 DIAGNOSIS — R112 Nausea with vomiting, unspecified: Secondary | ICD-10-CM | POA: Diagnosis not present

## 2014-08-11 DIAGNOSIS — Z23 Encounter for immunization: Secondary | ICD-10-CM | POA: Diagnosis not present

## 2014-08-11 DIAGNOSIS — F329 Major depressive disorder, single episode, unspecified: Secondary | ICD-10-CM | POA: Diagnosis not present

## 2014-08-11 DIAGNOSIS — M519 Unspecified thoracic, thoracolumbar and lumbosacral intervertebral disc disorder: Secondary | ICD-10-CM | POA: Diagnosis not present

## 2014-08-11 DIAGNOSIS — R51 Headache: Secondary | ICD-10-CM | POA: Diagnosis not present

## 2014-08-11 DIAGNOSIS — F3289 Other specified depressive episodes: Secondary | ICD-10-CM | POA: Diagnosis not present

## 2014-08-11 DIAGNOSIS — G9340 Encephalopathy, unspecified: Secondary | ICD-10-CM | POA: Diagnosis not present

## 2014-08-15 ENCOUNTER — Other Ambulatory Visit: Payer: Self-pay | Admitting: Gastroenterology

## 2014-08-15 DIAGNOSIS — K7682 Hepatic encephalopathy: Secondary | ICD-10-CM | POA: Diagnosis not present

## 2014-08-15 DIAGNOSIS — K7290 Hepatic failure, unspecified without coma: Secondary | ICD-10-CM | POA: Diagnosis not present

## 2014-08-15 LAB — AMMONIA: Ammonia, Plasma: 29 mcmol/L (ref 11–32)

## 2014-08-31 DIAGNOSIS — Z23 Encounter for immunization: Secondary | ICD-10-CM | POA: Diagnosis not present

## 2014-08-31 DIAGNOSIS — R51 Headache: Secondary | ICD-10-CM | POA: Diagnosis not present

## 2014-08-31 DIAGNOSIS — F329 Major depressive disorder, single episode, unspecified: Secondary | ICD-10-CM | POA: Diagnosis not present

## 2014-08-31 DIAGNOSIS — M519 Unspecified thoracic, thoracolumbar and lumbosacral intervertebral disc disorder: Secondary | ICD-10-CM | POA: Diagnosis not present

## 2014-08-31 DIAGNOSIS — F3289 Other specified depressive episodes: Secondary | ICD-10-CM | POA: Diagnosis not present

## 2014-08-31 DIAGNOSIS — G9340 Encephalopathy, unspecified: Secondary | ICD-10-CM | POA: Diagnosis not present

## 2014-09-21 DIAGNOSIS — M5126 Other intervertebral disc displacement, lumbar region: Secondary | ICD-10-CM | POA: Diagnosis not present

## 2014-09-21 DIAGNOSIS — M5020 Other cervical disc displacement, unspecified cervical region: Secondary | ICD-10-CM | POA: Diagnosis not present

## 2014-09-21 DIAGNOSIS — I10 Essential (primary) hypertension: Secondary | ICD-10-CM | POA: Diagnosis not present

## 2014-09-21 DIAGNOSIS — R000 Tachycardia, unspecified: Secondary | ICD-10-CM | POA: Diagnosis not present

## 2014-10-10 DIAGNOSIS — M5126 Other intervertebral disc displacement, lumbar region: Secondary | ICD-10-CM | POA: Diagnosis not present

## 2014-10-10 DIAGNOSIS — G4700 Insomnia, unspecified: Secondary | ICD-10-CM | POA: Diagnosis not present

## 2014-10-10 DIAGNOSIS — R112 Nausea with vomiting, unspecified: Secondary | ICD-10-CM | POA: Diagnosis not present

## 2014-10-10 DIAGNOSIS — R000 Tachycardia, unspecified: Secondary | ICD-10-CM | POA: Diagnosis not present

## 2014-10-20 DIAGNOSIS — R1314 Dysphagia, pharyngoesophageal phase: Secondary | ICD-10-CM | POA: Diagnosis not present

## 2014-10-20 DIAGNOSIS — K625 Hemorrhage of anus and rectum: Secondary | ICD-10-CM | POA: Diagnosis not present

## 2014-10-20 DIAGNOSIS — R1013 Epigastric pain: Secondary | ICD-10-CM | POA: Diagnosis not present

## 2014-10-20 DIAGNOSIS — G252 Other specified forms of tremor: Secondary | ICD-10-CM | POA: Diagnosis not present

## 2014-11-18 ENCOUNTER — Ambulatory Visit: Payer: Self-pay | Admitting: Family Medicine

## 2014-11-18 DIAGNOSIS — R112 Nausea with vomiting, unspecified: Secondary | ICD-10-CM | POA: Diagnosis not present

## 2014-11-18 DIAGNOSIS — R55 Syncope and collapse: Secondary | ICD-10-CM | POA: Diagnosis not present

## 2014-11-18 DIAGNOSIS — M5126 Other intervertebral disc displacement, lumbar region: Secondary | ICD-10-CM | POA: Diagnosis not present

## 2014-11-18 DIAGNOSIS — G4700 Insomnia, unspecified: Secondary | ICD-10-CM | POA: Diagnosis not present

## 2014-12-08 DIAGNOSIS — R55 Syncope and collapse: Secondary | ICD-10-CM | POA: Diagnosis not present

## 2014-12-08 DIAGNOSIS — M5126 Other intervertebral disc displacement, lumbar region: Secondary | ICD-10-CM | POA: Diagnosis not present

## 2014-12-08 DIAGNOSIS — M1990 Unspecified osteoarthritis, unspecified site: Secondary | ICD-10-CM | POA: Diagnosis not present

## 2014-12-08 DIAGNOSIS — M5020 Other cervical disc displacement, unspecified cervical region: Secondary | ICD-10-CM | POA: Diagnosis not present

## 2014-12-14 ENCOUNTER — Emergency Department: Payer: Self-pay | Admitting: Emergency Medicine

## 2014-12-14 DIAGNOSIS — E119 Type 2 diabetes mellitus without complications: Secondary | ICD-10-CM | POA: Diagnosis not present

## 2014-12-14 DIAGNOSIS — K7460 Unspecified cirrhosis of liver: Secondary | ICD-10-CM | POA: Diagnosis not present

## 2014-12-14 DIAGNOSIS — R0602 Shortness of breath: Secondary | ICD-10-CM | POA: Diagnosis not present

## 2014-12-14 DIAGNOSIS — I10 Essential (primary) hypertension: Secondary | ICD-10-CM | POA: Diagnosis not present

## 2014-12-14 LAB — CBC
HCT: 41.3 % (ref 40.0–52.0)
HGB: 14.2 g/dL (ref 13.0–18.0)
MCH: 31 pg (ref 26.0–34.0)
MCHC: 34.5 g/dL (ref 32.0–36.0)
MCV: 90 fL (ref 80–100)
PLATELETS: 115 10*3/uL — AB (ref 150–440)
RBC: 4.6 10*6/uL (ref 4.40–5.90)
RDW: 13 % (ref 11.5–14.5)
WBC: 10.6 10*3/uL (ref 3.8–10.6)

## 2014-12-14 LAB — COMPREHENSIVE METABOLIC PANEL
ANION GAP: 8 (ref 7–16)
Albumin: 3.5 g/dL (ref 3.4–5.0)
Alkaline Phosphatase: 186 U/L — ABNORMAL HIGH
BUN: 16 mg/dL (ref 7–18)
Bilirubin,Total: 0.7 mg/dL (ref 0.2–1.0)
CREATININE: 1.02 mg/dL (ref 0.60–1.30)
Calcium, Total: 8.5 mg/dL (ref 8.5–10.1)
Chloride: 103 mmol/L (ref 98–107)
Co2: 25 mmol/L (ref 21–32)
EGFR (African American): >60
EGFR (Non-African Amer.): >60
GLUCOSE: 143 mg/dL — AB (ref 65–99)
Osmolality: 276 (ref 275–301)
POTASSIUM: 4 mmol/L (ref 3.5–5.1)
SGOT(AST): 96 U/L — ABNORMAL HIGH (ref 15–37)
SGPT (ALT): 97 U/L — ABNORMAL HIGH
SODIUM: 136 mmol/L (ref 136–145)
TOTAL PROTEIN: 7.7 g/dL (ref 6.4–8.2)

## 2014-12-14 LAB — AMMONIA: Ammonia, Plasma: 28 mcmol/L (ref 11–32)

## 2014-12-14 LAB — PROTIME-INR
INR: 1
Prothrombin Time: 13.5 secs (ref 11.5–14.7)

## 2014-12-14 LAB — TROPONIN I: Troponin-I: <0.02 ng/mL

## 2014-12-23 ENCOUNTER — Emergency Department: Payer: Self-pay | Admitting: Internal Medicine

## 2014-12-23 DIAGNOSIS — R112 Nausea with vomiting, unspecified: Secondary | ICD-10-CM | POA: Diagnosis not present

## 2014-12-23 DIAGNOSIS — Z79891 Long term (current) use of opiate analgesic: Secondary | ICD-10-CM | POA: Diagnosis not present

## 2014-12-23 DIAGNOSIS — Z79899 Other long term (current) drug therapy: Secondary | ICD-10-CM | POA: Diagnosis not present

## 2014-12-23 DIAGNOSIS — R531 Weakness: Secondary | ICD-10-CM | POA: Diagnosis not present

## 2014-12-23 DIAGNOSIS — R197 Diarrhea, unspecified: Secondary | ICD-10-CM | POA: Diagnosis not present

## 2014-12-23 DIAGNOSIS — G8929 Other chronic pain: Secondary | ICD-10-CM | POA: Diagnosis not present

## 2014-12-23 DIAGNOSIS — R109 Unspecified abdominal pain: Secondary | ICD-10-CM | POA: Diagnosis not present

## 2014-12-23 DIAGNOSIS — R1110 Vomiting, unspecified: Secondary | ICD-10-CM | POA: Diagnosis not present

## 2014-12-23 DIAGNOSIS — R42 Dizziness and giddiness: Secondary | ICD-10-CM | POA: Diagnosis not present

## 2014-12-23 DIAGNOSIS — I10 Essential (primary) hypertension: Secondary | ICD-10-CM | POA: Diagnosis not present

## 2014-12-23 DIAGNOSIS — F419 Anxiety disorder, unspecified: Secondary | ICD-10-CM | POA: Diagnosis not present

## 2014-12-23 DIAGNOSIS — E119 Type 2 diabetes mellitus without complications: Secondary | ICD-10-CM | POA: Diagnosis not present

## 2014-12-23 DIAGNOSIS — R1084 Generalized abdominal pain: Secondary | ICD-10-CM | POA: Diagnosis not present

## 2014-12-23 LAB — URINALYSIS, COMPLETE
Bacteria: NONE SEEN
Bilirubin,UR: NEGATIVE
Blood: NEGATIVE
Glucose,UR: NEGATIVE mg/dL (ref 0–75)
Ketone: NEGATIVE
Leukocyte Esterase: NEGATIVE
Nitrite: NEGATIVE
PH: 6 (ref 4.5–8.0)
PROTEIN: NEGATIVE
RBC,UR: 1 /HPF (ref 0–5)
SPECIFIC GRAVITY: 1.009 (ref 1.003–1.030)
Squamous Epithelial: <1
WBC UR: <1 /HPF (ref 0–5)

## 2014-12-23 LAB — COMPREHENSIVE METABOLIC PANEL
ANION GAP: 9 (ref 7–16)
Albumin: 3.6 g/dL (ref 3.4–5.0)
Alkaline Phosphatase: 141 U/L — ABNORMAL HIGH
BUN: 12 mg/dL (ref 7–18)
Bilirubin,Total: 0.6 mg/dL (ref 0.2–1.0)
CO2: 25 mmol/L (ref 21–32)
Calcium, Total: 9.1 mg/dL (ref 8.5–10.1)
Chloride: 104 mmol/L (ref 98–107)
Creatinine: 0.89 mg/dL (ref 0.60–1.30)
EGFR (African American): >60
Glucose: 160 mg/dL — ABNORMAL HIGH (ref 65–99)
OSMOLALITY: 279 (ref 275–301)
POTASSIUM: 3.7 mmol/L (ref 3.5–5.1)
SGOT(AST): 140 U/L — ABNORMAL HIGH (ref 15–37)
SGPT (ALT): 147 U/L — ABNORMAL HIGH
SODIUM: 138 mmol/L (ref 136–145)
Total Protein: 8.2 g/dL (ref 6.4–8.2)

## 2014-12-23 LAB — TROPONIN I: Troponin-I: <0.02 ng/mL

## 2014-12-23 LAB — CBC
HCT: 42.5 % (ref 40.0–52.0)
HGB: 14.5 g/dL (ref 13.0–18.0)
MCH: 30.7 pg (ref 26.0–34.0)
MCHC: 34.1 g/dL (ref 32.0–36.0)
MCV: 90 fL (ref 80–100)
PLATELETS: 123 10*3/uL — AB (ref 150–440)
RBC: 4.71 10*6/uL (ref 4.40–5.90)
RDW: 13.1 % (ref 11.5–14.5)
WBC: 4.8 10*3/uL (ref 3.8–10.6)

## 2014-12-23 LAB — LIPASE, BLOOD: LIPASE: 121 U/L (ref 73–393)

## 2014-12-23 LAB — AMMONIA: Ammonia, Plasma: 20 mcmol/L (ref 11–32)

## 2014-12-27 DIAGNOSIS — M5126 Other intervertebral disc displacement, lumbar region: Secondary | ICD-10-CM | POA: Diagnosis not present

## 2014-12-27 DIAGNOSIS — M1990 Unspecified osteoarthritis, unspecified site: Secondary | ICD-10-CM | POA: Diagnosis not present

## 2014-12-27 DIAGNOSIS — R55 Syncope and collapse: Secondary | ICD-10-CM | POA: Diagnosis not present

## 2014-12-27 DIAGNOSIS — R112 Nausea with vomiting, unspecified: Secondary | ICD-10-CM | POA: Diagnosis not present

## 2015-01-03 DIAGNOSIS — M65331 Trigger finger, right middle finger: Secondary | ICD-10-CM | POA: Diagnosis not present

## 2015-01-03 DIAGNOSIS — M222X1 Patellofemoral disorders, right knee: Secondary | ICD-10-CM | POA: Diagnosis not present

## 2015-01-19 DIAGNOSIS — R1314 Dysphagia, pharyngoesophageal phase: Secondary | ICD-10-CM | POA: Diagnosis not present

## 2015-01-19 DIAGNOSIS — K7460 Unspecified cirrhosis of liver: Secondary | ICD-10-CM | POA: Diagnosis not present

## 2015-02-02 ENCOUNTER — Ambulatory Visit: Payer: Self-pay | Admitting: Gastroenterology

## 2015-02-02 DIAGNOSIS — K2930 Chronic superficial gastritis without bleeding: Secondary | ICD-10-CM | POA: Diagnosis not present

## 2015-02-02 DIAGNOSIS — K7460 Unspecified cirrhosis of liver: Secondary | ICD-10-CM | POA: Diagnosis not present

## 2015-02-02 DIAGNOSIS — R1310 Dysphagia, unspecified: Secondary | ICD-10-CM | POA: Diagnosis not present

## 2015-02-02 DIAGNOSIS — Z888 Allergy status to other drugs, medicaments and biological substances status: Secondary | ICD-10-CM | POA: Diagnosis not present

## 2015-02-02 DIAGNOSIS — I10 Essential (primary) hypertension: Secondary | ICD-10-CM | POA: Diagnosis not present

## 2015-02-02 DIAGNOSIS — Z885 Allergy status to narcotic agent status: Secondary | ICD-10-CM | POA: Diagnosis not present

## 2015-02-02 DIAGNOSIS — K2970 Gastritis, unspecified, without bleeding: Secondary | ICD-10-CM | POA: Diagnosis not present

## 2015-02-02 DIAGNOSIS — I2510 Atherosclerotic heart disease of native coronary artery without angina pectoris: Secondary | ICD-10-CM | POA: Diagnosis not present

## 2015-02-02 DIAGNOSIS — B1920 Unspecified viral hepatitis C without hepatic coma: Secondary | ICD-10-CM | POA: Diagnosis not present

## 2015-02-05 ENCOUNTER — Emergency Department: Payer: Self-pay | Admitting: Emergency Medicine

## 2015-02-05 DIAGNOSIS — E860 Dehydration: Secondary | ICD-10-CM | POA: Diagnosis not present

## 2015-02-05 DIAGNOSIS — I10 Essential (primary) hypertension: Secondary | ICD-10-CM | POA: Diagnosis not present

## 2015-02-05 DIAGNOSIS — K7689 Other specified diseases of liver: Secondary | ICD-10-CM | POA: Diagnosis not present

## 2015-02-05 DIAGNOSIS — J90 Pleural effusion, not elsewhere classified: Secondary | ICD-10-CM | POA: Diagnosis not present

## 2015-02-05 DIAGNOSIS — K828 Other specified diseases of gallbladder: Secondary | ICD-10-CM | POA: Diagnosis not present

## 2015-02-05 DIAGNOSIS — N289 Disorder of kidney and ureter, unspecified: Secondary | ICD-10-CM | POA: Diagnosis not present

## 2015-02-05 DIAGNOSIS — E119 Type 2 diabetes mellitus without complications: Secondary | ICD-10-CM | POA: Diagnosis not present

## 2015-02-05 DIAGNOSIS — R079 Chest pain, unspecified: Secondary | ICD-10-CM | POA: Diagnosis not present

## 2015-02-06 DIAGNOSIS — I959 Hypotension, unspecified: Secondary | ICD-10-CM | POA: Diagnosis not present

## 2015-02-06 DIAGNOSIS — R55 Syncope and collapse: Secondary | ICD-10-CM | POA: Diagnosis not present

## 2015-02-06 DIAGNOSIS — R51 Headache: Secondary | ICD-10-CM | POA: Diagnosis not present

## 2015-02-06 DIAGNOSIS — M5126 Other intervertebral disc displacement, lumbar region: Secondary | ICD-10-CM | POA: Diagnosis not present

## 2015-02-24 DIAGNOSIS — R55 Syncope and collapse: Secondary | ICD-10-CM | POA: Diagnosis not present

## 2015-02-24 DIAGNOSIS — G4700 Insomnia, unspecified: Secondary | ICD-10-CM | POA: Diagnosis not present

## 2015-02-24 DIAGNOSIS — R5383 Other fatigue: Secondary | ICD-10-CM | POA: Diagnosis not present

## 2015-02-24 DIAGNOSIS — M5126 Other intervertebral disc displacement, lumbar region: Secondary | ICD-10-CM | POA: Diagnosis not present

## 2015-02-24 DIAGNOSIS — R7309 Other abnormal glucose: Secondary | ICD-10-CM | POA: Diagnosis not present

## 2015-02-24 LAB — CBC AND DIFFERENTIAL
HEMATOCRIT: 38 % — AB (ref 41–53)
Hemoglobin: 13.3 g/dL — AB (ref 13.5–17.5)
Platelets: 120 10*3/uL — AB (ref 150–399)
WBC: 4 10*3/mL

## 2015-02-24 LAB — BASIC METABOLIC PANEL
BUN: 12 mg/dL (ref 4–21)
CREATININE: 0.8 mg/dL (ref 0.6–1.3)
Glucose: 155 mg/dL
Potassium: 3.9 mmol/L (ref 3.4–5.3)
SODIUM: 137 mmol/L (ref 137–147)

## 2015-02-24 LAB — TSH: TSH: 6.41 u[IU]/mL — AB (ref 0.41–5.90)

## 2015-02-24 LAB — HEMOGLOBIN A1C: Hgb A1c MFr Bld: 6 % (ref 4.0–6.0)

## 2015-02-24 LAB — HEPATIC FUNCTION PANEL: ALT: 100 U/L — AB (ref 10–40)

## 2015-03-08 DIAGNOSIS — R1084 Generalized abdominal pain: Secondary | ICD-10-CM | POA: Diagnosis not present

## 2015-03-08 DIAGNOSIS — R197 Diarrhea, unspecified: Secondary | ICD-10-CM | POA: Diagnosis not present

## 2015-03-15 DIAGNOSIS — N529 Male erectile dysfunction, unspecified: Secondary | ICD-10-CM | POA: Diagnosis not present

## 2015-03-15 DIAGNOSIS — L0390 Cellulitis, unspecified: Secondary | ICD-10-CM | POA: Diagnosis not present

## 2015-03-15 DIAGNOSIS — M5126 Other intervertebral disc displacement, lumbar region: Secondary | ICD-10-CM | POA: Diagnosis not present

## 2015-03-15 DIAGNOSIS — W540XXA Bitten by dog, initial encounter: Secondary | ICD-10-CM | POA: Diagnosis not present

## 2015-03-18 ENCOUNTER — Inpatient Hospital Stay
Admit: 2015-03-18 | Disposition: A | Discharge status: Home / Self Care | Payer: Self-pay | Attending: Internal Medicine | Admitting: Internal Medicine

## 2015-03-18 DIAGNOSIS — K219 Gastro-esophageal reflux disease without esophagitis: Secondary | ICD-10-CM | POA: Diagnosis present

## 2015-03-18 DIAGNOSIS — G894 Chronic pain syndrome: Secondary | ICD-10-CM | POA: Diagnosis present

## 2015-03-18 DIAGNOSIS — J189 Pneumonia, unspecified organism: Secondary | ICD-10-CM | POA: Diagnosis not present

## 2015-03-18 DIAGNOSIS — G9341 Metabolic encephalopathy: Secondary | ICD-10-CM | POA: Diagnosis not present

## 2015-03-18 DIAGNOSIS — B182 Chronic viral hepatitis C: Secondary | ICD-10-CM | POA: Diagnosis not present

## 2015-03-18 DIAGNOSIS — G43909 Migraine, unspecified, not intractable, without status migrainosus: Secondary | ICD-10-CM | POA: Diagnosis present

## 2015-03-18 DIAGNOSIS — K7460 Unspecified cirrhosis of liver: Secondary | ICD-10-CM | POA: Diagnosis not present

## 2015-03-18 DIAGNOSIS — R42 Dizziness and giddiness: Secondary | ICD-10-CM | POA: Diagnosis not present

## 2015-03-18 DIAGNOSIS — R4182 Altered mental status, unspecified: Secondary | ICD-10-CM | POA: Diagnosis not present

## 2015-03-18 DIAGNOSIS — F329 Major depressive disorder, single episode, unspecified: Secondary | ICD-10-CM | POA: Diagnosis present

## 2015-03-18 DIAGNOSIS — K7290 Hepatic failure, unspecified without coma: Secondary | ICD-10-CM | POA: Diagnosis not present

## 2015-03-18 DIAGNOSIS — R400 Somnolence: Secondary | ICD-10-CM | POA: Diagnosis not present

## 2015-03-18 DIAGNOSIS — M1990 Unspecified osteoarthritis, unspecified site: Secondary | ICD-10-CM | POA: Diagnosis present

## 2015-03-18 DIAGNOSIS — R079 Chest pain, unspecified: Secondary | ICD-10-CM | POA: Diagnosis not present

## 2015-03-18 DIAGNOSIS — J209 Acute bronchitis, unspecified: Secondary | ICD-10-CM | POA: Diagnosis not present

## 2015-03-18 DIAGNOSIS — E1165 Type 2 diabetes mellitus with hyperglycemia: Secondary | ICD-10-CM | POA: Diagnosis not present

## 2015-03-18 DIAGNOSIS — Z79899 Other long term (current) drug therapy: Secondary | ICD-10-CM | POA: Diagnosis not present

## 2015-03-18 DIAGNOSIS — B1920 Unspecified viral hepatitis C without hepatic coma: Secondary | ICD-10-CM | POA: Diagnosis present

## 2015-03-18 DIAGNOSIS — E871 Hypo-osmolality and hyponatremia: Secondary | ICD-10-CM | POA: Diagnosis not present

## 2015-03-18 DIAGNOSIS — I10 Essential (primary) hypertension: Secondary | ICD-10-CM | POA: Diagnosis present

## 2015-03-18 DIAGNOSIS — Z8619 Personal history of other infectious and parasitic diseases: Secondary | ICD-10-CM | POA: Diagnosis not present

## 2015-03-18 DIAGNOSIS — R509 Fever, unspecified: Secondary | ICD-10-CM | POA: Diagnosis not present

## 2015-03-18 DIAGNOSIS — R51 Headache: Secondary | ICD-10-CM | POA: Diagnosis not present

## 2015-03-18 DIAGNOSIS — R739 Hyperglycemia, unspecified: Secondary | ICD-10-CM | POA: Diagnosis not present

## 2015-03-18 DIAGNOSIS — Z79891 Long term (current) use of opiate analgesic: Secondary | ICD-10-CM | POA: Diagnosis not present

## 2015-03-18 DIAGNOSIS — R0602 Shortness of breath: Secondary | ICD-10-CM | POA: Diagnosis not present

## 2015-03-18 LAB — BASIC METABOLIC PANEL
Anion Gap: 4 — ABNORMAL LOW (ref 7–16)
BUN: 9 mg/dL
CALCIUM: 7.8 mg/dL — AB
CHLORIDE: 102 mmol/L
CO2: 26 mmol/L
CREATININE: 0.73 mg/dL
GLUCOSE: 152 mg/dL — AB
POTASSIUM: 3.9 mmol/L
Sodium: 132 mmol/L — ABNORMAL LOW

## 2015-03-18 LAB — URINALYSIS, COMPLETE
BACTERIA: NONE SEEN
BILIRUBIN, UR: NEGATIVE
Blood: NEGATIVE
GLUCOSE, UR: NEGATIVE mg/dL (ref 0–75)
Ketone: NEGATIVE
LEUKOCYTE ESTERASE: NEGATIVE
Nitrite: NEGATIVE
Ph: 5 (ref 4.5–8.0)
Protein: NEGATIVE
Specific Gravity: 1.018 (ref 1.003–1.030)

## 2015-03-18 LAB — PRO B NATRIURETIC PEPTIDE: B-Type Natriuretic Peptide: 24 pg/mL

## 2015-03-18 LAB — PHOSPHORUS: PHOSPHORUS: 2.2 mg/dL — AB

## 2015-03-18 LAB — CBC
HCT: 38.9 % — ABNORMAL LOW (ref 40.0–52.0)
HGB: 13.1 g/dL (ref 13.0–18.0)
MCH: 30.8 pg (ref 26.0–34.0)
MCHC: 33.6 g/dL (ref 32.0–36.0)
MCV: 92 fL (ref 80–100)
Platelet: 109 10*3/uL — ABNORMAL LOW (ref 150–440)
RBC: 4.24 10*6/uL — AB (ref 4.40–5.90)
RDW: 13.4 % (ref 11.5–14.5)
WBC: 7.5 10*3/uL (ref 3.8–10.6)

## 2015-03-18 LAB — TROPONIN I: Troponin-I: <0.03 ng/mL

## 2015-03-18 LAB — HEPATIC FUNCTION PANEL A (ARMC)
Albumin: 3.5 g/dL
Alkaline Phosphatase: 175 U/L — ABNORMAL HIGH
BILIRUBIN INDIRECT: 0.6
Bilirubin, Direct: 0.4 mg/dL
Bilirubin,Total: 1 mg/dL
SGOT(AST): 84 U/L — ABNORMAL HIGH
SGPT (ALT): 71 U/L — ABNORMAL HIGH
TOTAL PROTEIN: 7.3 g/dL

## 2015-03-18 LAB — AMMONIA: AMMONIA, PLASMA: 48 umol/L — AB

## 2015-03-18 LAB — MAGNESIUM: MAGNESIUM: 1.6 mg/dL — AB

## 2015-03-19 LAB — CBC WITH DIFFERENTIAL/PLATELET
Basophil #: 0 10*3/uL (ref 0.0–0.1)
Basophil %: 0.5 %
EOS ABS: 0.1 10*3/uL (ref 0.0–0.7)
EOS PCT: 1.9 %
HCT: 36.1 % — AB (ref 40.0–52.0)
HGB: 12.1 g/dL — AB (ref 13.0–18.0)
LYMPHS ABS: 1.4 10*3/uL (ref 1.0–3.6)
Lymphocyte %: 21.8 %
MCH: 30.9 pg (ref 26.0–34.0)
MCHC: 33.6 g/dL (ref 32.0–36.0)
MCV: 92 fL (ref 80–100)
MONO ABS: 0.4 x10 3/mm (ref 0.2–1.0)
Monocyte %: 6 %
NEUTROS PCT: 69.8 %
Neutrophil #: 4.5 10*3/uL (ref 1.4–6.5)
PLATELETS: 86 10*3/uL — AB (ref 150–440)
RBC: 3.93 10*6/uL — ABNORMAL LOW (ref 4.40–5.90)
RDW: 13.3 % (ref 11.5–14.5)
WBC: 6.5 10*3/uL (ref 3.8–10.6)

## 2015-03-19 LAB — COMPREHENSIVE METABOLIC PANEL
ALK PHOS: 120 U/L
ALT: 56 U/L
AST: 67 U/L — AB
Albumin: 3 g/dL — ABNORMAL LOW
Anion Gap: 3 — ABNORMAL LOW (ref 7–16)
BUN: 7 mg/dL
Bilirubin,Total: 0.9 mg/dL
Calcium, Total: 7.8 mg/dL — ABNORMAL LOW
Chloride: 106 mmol/L
Co2: 28 mmol/L
Creatinine: 0.68 mg/dL
EGFR (African American): >60
EGFR (Non-African Amer.): >60
Glucose: 113 mg/dL — ABNORMAL HIGH
Potassium: 3.7 mmol/L
SODIUM: 137 mmol/L
Total Protein: 6.3 g/dL — ABNORMAL LOW

## 2015-03-19 LAB — MAGNESIUM: Magnesium: 2.1 mg/dL

## 2015-03-19 LAB — PROTIME-INR
INR: 1.1
PROTHROMBIN TIME: 14.4 s

## 2015-03-19 LAB — AMMONIA: Ammonia, Plasma: 54 umol/L — ABNORMAL HIGH

## 2015-03-19 LAB — HEMOGLOBIN A1C: HEMOGLOBIN A1C: 5.9 %

## 2015-03-21 DIAGNOSIS — R410 Disorientation, unspecified: Secondary | ICD-10-CM | POA: Diagnosis not present

## 2015-03-21 DIAGNOSIS — R05 Cough: Secondary | ICD-10-CM | POA: Diagnosis not present

## 2015-03-21 DIAGNOSIS — F329 Major depressive disorder, single episode, unspecified: Secondary | ICD-10-CM | POA: Diagnosis not present

## 2015-03-21 DIAGNOSIS — I10 Essential (primary) hypertension: Secondary | ICD-10-CM | POA: Diagnosis not present

## 2015-03-21 DIAGNOSIS — K7460 Unspecified cirrhosis of liver: Secondary | ICD-10-CM | POA: Diagnosis not present

## 2015-03-21 DIAGNOSIS — Z0389 Encounter for observation for other suspected diseases and conditions ruled out: Secondary | ICD-10-CM | POA: Diagnosis not present

## 2015-03-21 DIAGNOSIS — T63001S Toxic effect of unspecified snake venom, accidental (unintentional), sequela: Secondary | ICD-10-CM | POA: Diagnosis not present

## 2015-03-21 DIAGNOSIS — K219 Gastro-esophageal reflux disease without esophagitis: Secondary | ICD-10-CM | POA: Diagnosis not present

## 2015-03-21 DIAGNOSIS — K7290 Hepatic failure, unspecified without coma: Secondary | ICD-10-CM | POA: Diagnosis not present

## 2015-03-21 DIAGNOSIS — S71002S Unspecified open wound, left hip, sequela: Secondary | ICD-10-CM | POA: Diagnosis not present

## 2015-03-21 DIAGNOSIS — G629 Polyneuropathy, unspecified: Secondary | ICD-10-CM | POA: Diagnosis not present

## 2015-03-21 NOTE — Op Note (Signed)
PATIENT NAME:  TRAMAINE, SAULS MR#:  [id number] DATE OF BIRTH:  Feb 02, 1961  DATE OF PROCEDURE:  08/06/2012  PREOPERATIVE DIAGNOSIS:  Right inguinal hernia.   POSTOPERATIVE DIAGNOSIS:   Right inguinal hernia.   OPERATION: Repair of right inguinal hernia.   SURGEON: Mckinley Jewel, M.D.   ANESTHESIA: General.   COMPLICATIONS: None.  ESTIMATED BLOOD LOSS: Minimal.  DRAINS: None.   DESCRIPTION OF PROCEDURE: The patient was put to sleep in the supine position on the operating table. The right groin was prepped and draped out as a sterile field. A local anesthetic of 0.5% Marcaine with 1% Xylocaine was instilled with a total of 20 mL used to obtain adequate block and postoperative analgesia. A skin incision was made along the medial two-thirds of the inguinal canal and deepened through the layers down to the external oblique, bleeding controlled with cautery and ligatures of 3-0 Vicryl. The external oblique was opened along the line of its fibers and dissection of the inguinal canal revealed two things. He had a small lipoma of the cord with a tiny sac attached to it. This was freed, suture ligated with 2-0 Vicryl and amputated. Also noted was a moderate weakness in the posterior wall with a bulging area of direct hernia which appeared to occupy the entire portion medial to the internal ring. After the posterior wall was satisfactorily exposed and the ilioinguinal nerve was preserved, a ProGrip mesh was then brought up. This was cut to approximate length and configuration, placed around the internal ring and laid down into the posterior wall. The medial end was tacked to the pubic tubercle with a single 2-0 PDS stitch. The lateral ends were tucked underneath the external oblique. A satisfactory reinforcement of this area was obtained. The external oblique was closed with running 2-0 PDS, the subcutaneous tissue with 3-0 Vicryl and the skin with subcuticular 4-0 Vicryl reinforced with Steri-Strips and a dry  sterile dressing was placed. The patient tolerated the    procedure well. There were no immediate problems encountered. He was subsequently extubated and returned to the recovery room in stable condition.   ____________________________ S.Robinette Haines, MD sgs:ap D: 08/07/2012 09:57:09 ET T: 08/07/2012 15:31:03 ET JOB#: [id number]  cc: S.G. Jamal Collin, MD, <Dictator> [facility] Robinette Haines MD ELECTRONICALLY SIGNED 08/08/2012 9:46

## 2015-03-22 DIAGNOSIS — M1990 Unspecified osteoarthritis, unspecified site: Secondary | ICD-10-CM | POA: Insufficient documentation

## 2015-03-22 DIAGNOSIS — T63091A Toxic effect of venom of other snake, accidental (unintentional), initial encounter: Secondary | ICD-10-CM | POA: Diagnosis present

## 2015-03-22 DIAGNOSIS — G629 Polyneuropathy, unspecified: Secondary | ICD-10-CM | POA: Diagnosis present

## 2015-03-22 DIAGNOSIS — K219 Gastro-esophageal reflux disease without esophagitis: Secondary | ICD-10-CM | POA: Diagnosis not present

## 2015-03-22 DIAGNOSIS — R410 Disorientation, unspecified: Secondary | ICD-10-CM | POA: Diagnosis not present

## 2015-03-22 DIAGNOSIS — K7290 Hepatic failure, unspecified without coma: Secondary | ICD-10-CM | POA: Diagnosis not present

## 2015-03-22 DIAGNOSIS — Z885 Allergy status to narcotic agent status: Secondary | ICD-10-CM | POA: Diagnosis not present

## 2015-03-22 DIAGNOSIS — B1920 Unspecified viral hepatitis C without hepatic coma: Secondary | ICD-10-CM | POA: Diagnosis present

## 2015-03-22 DIAGNOSIS — F329 Major depressive disorder, single episode, unspecified: Secondary | ICD-10-CM | POA: Diagnosis present

## 2015-03-22 DIAGNOSIS — K7460 Unspecified cirrhosis of liver: Secondary | ICD-10-CM | POA: Diagnosis not present

## 2015-03-22 DIAGNOSIS — Z8711 Personal history of peptic ulcer disease: Secondary | ICD-10-CM | POA: Diagnosis not present

## 2015-03-22 DIAGNOSIS — I10 Essential (primary) hypertension: Secondary | ICD-10-CM | POA: Diagnosis not present

## 2015-03-22 DIAGNOSIS — Z79899 Other long term (current) drug therapy: Secondary | ICD-10-CM | POA: Diagnosis not present

## 2015-03-24 NOTE — Discharge Summary (Signed)
PATIENT NAME:  Ray Robles, POPLASKI MR#:  [id number] DATE OF BIRTH:  29-Sep-1961  DATE OF ADMISSION:  08/09/2013 DATE OF DISCHARGE:  08/10/2013  PRIMARY CARE PHYSICIAN: Dr. [name]  PSYCHIATRIST: Dr. Weber Cooks.  FINAL DIAGNOSES:  1.  Hepatic encephalopathy with history of hepatitis C cirrhosis.  2.  Hypertension.  3.  Hypokalemia.  4.  Suicidal ideation.   MEDICATIONS ON DISCHARGE: Include Xanax 1 mg twice a day as needed for anxiety, the patient's amitriptyline was changed over to Lexapro 10 mg daily as per psychiatry, furosemide 20 mg daily, metoprolol 50 mg 1/2 tablet twice a day, nitroglycerin 0.4 mg sublingually every 5 minutes as needed for chest pain, omeprazole 20 mg daily, oxycodone 15 mg 2 tablets every 8 hours as needed for pain. I did have to write the patient a small prescription of this because the police took it when he got pulled over and lactulose 30 mL 3 times a day with goal to have 3 bowel movements per day.   DIET: Low sodium diet, regular consistency.   ACTIVITY: As tolerated.   FOLLOWUP: With Dr. Weber Cooks psychiatry and Dr. [name].   The patient was admitted as an observation on 08/09/2013 and discharged 08/10/2013. He came in with confusion and forgetfulness.   HISTORY OF PRESENT ILLNESS: This is a 54 year old man with history of hep C, liver cirrhosis and hepatic encephalopathy. He was confused and forgetful and not taking his lactulose. His ammonia level was slightly high in the ER and hospitalist services were contacted for further evaluation. He was started back on his lactulose.   LABORATORY AND RADIOLOGICAL DATA DURING THE HOSPITAL COURSE: Included an EKG that showed normal sinus rhythm. Urinalysis: 30 mg/dL of protein. Troponin negative. Glucose 117, BUN 17, creatinine 0.99, sodium 135, potassium 3.3, chloride 104, CO2 26, calcium 8.7. Liver function tests: Alkaline phosphatase up at 155, ALT 77, AST elevated at 85. White blood cell count 8.0, hemoglobin and hematocrit  15.6 and 44.6, platelet count 191. Ammonia level 42. Chest x-ray negative. CT scan of the head negative. Repeat ammonia level was 45.   HOSPITAL COURSE PER PROBLEM LIST:  1.  For the patient's hepatic encephalopathy and history of hep C cirrhosis, the patient was started back on his lactulose. Ammonia level did not change, but his mental status was much improved. He was advised that he must have 3 bowel movements per day and he was given a prescription for lactulose 30 mL 3 times a day.  2.  Hypertension. He is on low-dose metoprolol.  3.  Hypokalemia. This was replaced during the hospital course.  4.  Suicidal ideation. The patient was seen in consultation by Dr. Gretel Acre, psychiatry, who changed the amitriptyline to Lexapro prior to discharge and have patient followup with Dr. Weber Cooks as outpatient.   TIME SPENT ON DISCHARGE: 40 minutes.  ____________________________ Tana Conch. Leslye Peer, MD rjw:aw D: 08/11/2013 15:13:02 ET T: 08/11/2013 15:21:12 ET JOB#: [id number]  cc: Tana Conch. Leslye Peer, MD, <Dictator> Gonzella Lex, MD Kirstie Peri. [name], MD Marisue Brooklyn MD ELECTRONICALLY SIGNED 08/13/2013 16:15

## 2015-03-24 NOTE — H&P (Signed)
PATIENT NAME:  Ray Robles, Ray Robles MR#:  [id number] DATE OF BIRTH:  14-Apr-1961  DATE OF ADMISSION:  08/09/2013  REFERRING PHYSICIAN: Dr. Corky Downs PRIMARY CARE PHYSICIAN: Dr. Lelon Huh   CHIEF COMPLAINT: Brought in by wife for confusion, forgetfulness.   HISTORY OF PRESENT ILLNESS: The patient is a 54 year old Caucasian male with a history of hepatitis C and liver cirrhosis, hepatic encephalopathy.  He was brought in by wife after the patient has been having sluggishness, acting almost drunk, being confused and forgetting things for the past couple of weeks. The patient has been refusing to come to the ER. The patient was supposed to be on lactulose, but on further discussion with him he states that he possibly ran out a couple of months ago and has not been taking it. To me, currently, he appears to be lucid and not confused but perhaps a little sluggish. His ammonia level is slightly high at 42. Hospitalist services were contacted for further evaluation and management for his confusion and altered mental status. Of note, he has got a CAT scan of the head which was negative for acute stroke.   PAST MEDICAL HISTORY:   1.  History of chronic hep C. 2.  Cirrhosis.  3.  Hepatic encephalopathy.  4.  History of esophagitis, gastritis and duodenitis in the past.  5.  History of a Schatzki ring, status post dilation.  6.  Hypertension.  7.  Diabetes.  8.  History of depression.  9.  Herniated disk.  10.  Chronic pain.   PAST SURGICAL HISTORY: He had liver biopsy several months ago with Dr. Candace Cruise but has not followed up as he stated that he did not have transportation.   ALLERGIES: IBUPROFEN, MORPHINE AND TYLENOL.   SOCIAL HISTORY: No tobacco, alcohol.  Did use IV drugs decades ago in his 79s but denies it now.   FAMILY HISTORY: Positive for mom with throat cancer, father with bladder cancer.   OUTPATIENT MEDICATIONS:  1.  He is supposed to be on lactulose 30 mL twice daily, but he has not taken it  for possibly a couple of months. 2.  Alprazolam 1 mg 2 times a day as needed for anxiety. 3.  Amitriptyline 25 mg at bedtime for sleep.  4.  Lasix p.r.n. 20 mg once a day. 5.  Hyoscyamine 0.125 mg disintegrating tablet 1 tab 4 times a day as needed for abdominal cramping. 6.  Lomotil p.r.n.   7.  Metoprolol tartrate 25 mg 2 times a day.  8.  Nitroglycerin p.r.n. sublingual.  9.  Omeprazole 20 mg daily.  10.  Oxycodone 30 mg every 4 hours as needed for pain.  11.  Promethazine 25 mg every 6 hours rectally for nausea and vomiting.   REVIEW OF SYSTEMS:  CONSTITUTIONAL: He has chronic pain and had an episode of weight loss earlier this year gained them all back.  EYES: Has chronic blurry vision.  EARS, NOSE AND THROAT: No tinnitus or hearing loss.  RESPIRATORY: No cough. Has some shortness of breath chronically. No dyspnea on exertion. No COPD.  CARDIOVASCULAR: No chest pain, no orthopnea or swelling in the legs. He has high blood pressure.  GASTROINTESTINAL: Has had constipation the week prior, and now he had some loose stools yesterday and today. Denies melena or dark stools.  GENITOURINARY: Denies dysuria or hematuria, but he has difficulty starting stream.  ENDOCRINE: Denies polyuria, nocturia.  HEMATOLOGIC/LYMPHATIC: Denies anemia or easy bruising.  SKIN: Denies rashes.  MUSCULOSKELETAL: Has chronic back  pain, neck pain and arthritis. PSYCHIATRIC: Wife mentioned that the patient had expressed harming himself, which the patient stated that he has no intention of harming himself, has anxiety and depression, has insomnia.  PHYSICAL EXAMINATION:   VITAL SIGNS: Temperature on arrival 98, pulse rate 94, respiratory rate 20, blood pressure on arrival 160/93, O2 sat 98% on room air.  GENERAL: The patient is a somewhat anxious, Caucasian, thin male lying in bed in no obvious distress.  HEENT: Normocephalic, atraumatic. Pupils are equal and reactive. Anicteric sclerae. Extraocular muscles intact.  Moist mucous membranes.  NECK: Supple. No thyroid tenderness. No cervical lymphadenopathy.  CARDIOVASCULAR: S1, S2, regular rate and rhythm. No murmurs, rubs or gallops.  LUNGS: Clear to auscultation without wheezing, rhonchi or rales.  ABDOMEN: Soft, nontender and nondistended. Hyperactive.  EXTREMITIES: No significant lower extremity edema.  NEUROLOGIC: Cranial nerves II through XII grossly intact. Strength is 5 out of 5 in all extremities. Sensation is intact to light touch.  PSYCHIATRIC: Awake, alert, oriented x 3, anxious.   LABS AND IMAGING:  CAT scan of the head as above.  BUN 17, creatinine 0.99, sodium 135, potassium 3.3, ammonia level 42, alk phos 155, AST 85, ALT 77. Troponin negative. CBC within normal limits. Urinalysis not suggestive of infection.   ASSESSMENT AND PLAN: We have a 54 year old male with cirrhosis, hepatitis C and hepatic encephalopathy, chronic pain on high-dose opiates, anxiety who has been having off and on sluggishness, confusion, forgetfulness for the past couple of weeks, and the wife brought him here for lactulose she told me. The patient appears to be awake, alert, oriented, a little sluggish but CT of the head is negative, and he is neurologically intact. I believe the patient has metabolic encephalopathy secondary to hepatic encephalopathy. Although the ammonia level is not high, he has not been taking his lactulose and since then has developed these symptoms, and ammonia level but does not correlate well with hepatic encephalopathy, nonetheless. I would start him on lactulose at this point, and I discussed with him the importance of this medication and compliance with this. He states that he previously had had cost issues, and sometimes his insurance does not cover this, but he ran out and he did not enough money a couple of months ago.  I would replace his potassium at this point as well. He is a little sluggish, but he is awake, alert, oriented x 3 to me. The  wife did mention that he has chronic pain, and he has been on oxycodone short acting. Although this could potentially cause his altered mental status, the symptoms started acutely after cessation of lactulose.  Also, the patient has been on this for years, per him and the wife.  I would hold benzo at this point. I would monitor him overnight and recheck another ammonia level. He does not have a fever, significant leukocytosis, and has a negative UA. The wife, in passing, mentioned some suicidal ideation, but the patient stated that he has no intention of harming himself or his family via harming himself. He has no plan and states that he has 4 kids and his wife, and he will never put his children through that. I would obtain a psych consult for his anxiety and depression, and hopefully we can discharge him tomorrow with a prescription for lactulose if all is well.   TOTAL TIME SPENT: 50 minutes.   ____________________________ Vivien Presto, MD sa:cb D: 08/09/2013 16:28:05 ET T: 08/09/2013 17:00:11 ET JOB#: [id number]  cc: [email or web address]  Bridgette Habermann, MD, <Dictator> Kirstie Peri. [name], MD Vivien Presto MD ELECTRONICALLY SIGNED 08/24/2013 13:53

## 2015-03-24 NOTE — Consult Note (Signed)
PATIENT NAME:  Ray Robles, Ray Robles MR#:  [id number] DATE OF BIRTH:  1961/07/25  DATE OF CONSULTATION:  08/10/2013  REFERRING PHYSICIAN:  Vivien Presto, MD CONSULTING PHYSICIAN:  Cordelia Pen. Gretel Acre, MD  REASON FOR CONSULTATION: Medication adjustment.   HISTORY OF PRESENT ILLNESS: The patient is a 54 year old married Caucasian male with history of hepatitis C and cirrhosis of the liver who was admitted after he was brought in by his wife due to feeling sluggish and acting almost drunk. The patient was confused and forgetting things for the past couple of weeks. The patient was refusing to come to the ER, but he was brought in by his wife. The patient was supposed to be on lactulose, but it was found out that he ran out of the medications a couple of months ago and has not been taking it on a regular basis. The patient was not lucid and confused and his ammonia level was slightly high at 42. Hospitalist service was contacted for further evaluation and management of his confusion and altered mental status. He was admitted for stabilization and safety. Psychiatric consult was placed as the patient's wife also mentioned in passing by that the patient might be having some suicidal thoughts.   During my interview, the patient was sitting comfortably in the chair. He reported that he came to the hospital because of his high ammonia level. He reported that he ran out of the medication due to having some financial concerns. He reported that he gets the medication through his GI doctor. He reported that he has been feeling depressed most of the time because of his diagnosis of hepatitis C and has 4 children and a wife and has been having some financial restraints. The patient reported that he is unable to afford most of the time and he is unable to work at this time. He reported that he feels depressed and was following with Dr. Weber Cooks in the past who was prescribing his medication. He reported that he really liked Dr.  Weber Cooks but was unable to see him as his insurance changed and then he was unable to afford the medication. He reported that Dr. Weber Cooks gave him some medication which caused jerking movements of his body, but he did not realize it. When he went to the pharmacy, he was told it was side effects of his current medications. The patient stopped those medications and then he was unable to come back for the follow-up appointment. He reported that he is interested in coming back and see Dr. Weber Cooks again. However, he mentioned that he is currently feeling very depressed, hopeless, helpless and wants to take the medication to help with his depressive symptoms. He reported that he is never going to kill himself because of his 4 children as well as his wife. He stated that he wants them to have the support and also if he will kill himself then his check will stop coming. He is never going to hurt himself at this time.   PAST PSYCHIATRIC HISTORY: The patient has long history of mental illness and was diagnosed with bipolar as well as schizophrenia while in the prison. He was in prison for almost 13 years for manslaughter charges. He was tried on Mellaril in the past. He has also tried Zoloft and Seroquel. He reported that he is again interested in taking medications. The patient has a history of nightmares in the past. He also has a history of anger issues in the past and was threatening as well  as hyper in his past history. However, he feels depressed most of the time now.   PAST MEDICAL HISTORY: Chronic hepatitis C, cirrhosis, hepatic encephalopathy, esophagitis, gastritis and duodenitis in the past, Schatzki's ring status post dilatation, hypertension, diabetes, herniated disk and chronic pain.   PAST SURGICAL HISTORY: Liver biopsy done several months ago by Dr. Candace Cruise, but has not followed up as he reported that he did not have transportation.   ALLERGIES: IBUPROFEN, MORPHINE AND TYLENOL.   SOCIAL HISTORY: The  patient is currently married and lives with his wife and 4 children. He has history of using IV drugs in his 79s, but is not doing anything now.   FAMILY HISTORY: Positive for throat cancer in his mother and father with bladder cancer.   OUTPATIENT MEDICATIONS: 1.  Lactulose 30 mL twice daily, but he has not taken it for the past couple of months. 2.  Alprazolam 1 mg twice daily, prescribed by his primary care physician. 3.  Lasix 20 mg once daily. 4.  Hyoscyamine 0.125 mg disintegrating tablets 4 times a day as needed for abdominal cramping.  5.  Lomotil p.r.n. 6.  Metoprolol 25 mg b.i.d. 7.  Nitroglycerin p.r.n. sublingual. 8.  Omeprazole 20 mg p.o. daily. 9.  Oxycodone 30 mg every 4 hours as needed for pain. 10.  Promethazine 25 mg as needed rectally for nausea and vomiting.  REVIEW OF SYSTEMS: CONSTITUTIONAL: The patient was having chronic pain and episode of weight loss before coming to the hospital.  EYES: Denies having any blurred vision. EAR, NOSE AND THROAT: No tinnitus or hearing loss.  RESPIRATORY: Denies any cough or shortness of breath.  CARDIOVASCULAR: No chest pain, orthopnea or swelling in the legs.  GASTROINTESTINAL: History of constipation. GENITOURINARY: Denies any hematuria or dysuria.  ENDOCRINE: No polyuria or nocturia.  HEMATOLOGIC: No anemia or easy bruising.  SKIN: No rash.  MUSCULOSKELETAL: History of chronic neck and back pain.   VITAL SIGNS: Temperature 98, pulse 97, respirations 18, blood pressure 145/80.  LABORATORY DATA: Glucose 101, BUN 12, creatinine 0.87, sodium 137, potassium 3.8, chloride 106, bicarbonate 23, anion gap 8, osmolality 274, calcium 8.4, ammonia 45, protein 8.3, bilirubin 0.7, alkaline phosphatase 155, AST 85, ALT 77. TSH 4.56. WBC 6.0, RBC 4.65, hemoglobin 14.4, hematocrit 41.6, MCV 89.   MENTAL STATUS EXAMINATION: The patient is a thinly built male who appeared his stated age. He was calm and cooperative. He became sad and tearful  during part of the interview. He currently denied having any suicidal ideations or plans. He maintained fair eye contact. His speech was low in tone and volume. His mood was depressed. Affect was congruent. Thought process was logical, goal-directed. Thought content was nondelusional. He currently demonstrated fair insight and judgment.   DIAGNOSTIC IMPRESSION: AXIS I: Major depressive disorder, recurrent, moderate.  AXIS II: None.  AXIS III: Please review the medical history.   TREATMENT PLAN: 1.  I discussed with the patient at length about the medications, treatment risks, benefits and alternatives. He reported that he is getting Xanax 1 mg p.o. b.i.d. from his primary care physician to help with his anxiety and to help with sleep. I will continue the medication as prescribed.  2.  I will also start him on Lexapro 10 mg p.o. daily to help with his depression at this time.  3.  He will follow up with Dr. Weber Cooks in the outpatient clinic, and the patient was provided information about the office phone number as well as the address. He  will call to make a follow-up appointment.  4.  I will discontinue the amitriptyline as the patient is not taking it at this time.  5.  The patient was advised to follow up as soon as possible with Dr. Weber Cooks and he demonstrated understanding.   Thank you for allowing me to participate in the care of this patient.  ____________________________ Cordelia Pen. Gretel Acre, MD usf:sb D: 08/10/2013 15:09:27 ET T: 08/10/2013 15:47:06 ET JOB#: [id number]  cc: Cordelia Pen. Gretel Acre, MD, <Dictator> Jeronimo Norma MD ELECTRONICALLY SIGNED 08/12/2013 13:22

## 2015-03-25 NOTE — Consult Note (Signed)
PATIENT NAME:  Ray Robles, Ray Robles MR#:  [id number] DATE OF BIRTH:  06-13-1961  DATE OF CONSULTATION:  01/29/2014   CONSULTING PHYSICIAN:  Ray Dawn. Josafat Enrico, Robles  REASON FOR REFERRAL: History of hepatitis C and weight gain and mental status changes.   DESCRIPTION: The patient is a 54 year old white male with a known history of chronic hepatitis C and liver cirrhosis who has been hospitalized on multiple occasion due to hepatic encephalopathy. He was brought in yesterday because of increasing mental changes along with a 40 pound weight gain after switching from Lexapro to Seroquel for nightmares. He noticed that legs are more swollen. His abdomen was getting bigger the point he could not wear his normal clothes. He also had been complaining of some shortness of breath as well. He also has had some complaints of coughing and some fevers and his wife noticed increasing altered worsening mental status change. He is supposed to take lactulose, but does not take in on a regular basis.   He has had a work-up for his hepatitis C in the past. He was seen in our office by Ray Robles. The patient never followed up. He has also had several upper endoscopies done for  food bolus and dysphagia. Last endoscopy was also in 2013. The patient admits multiple office visits afterwards. According to the wife, the patient is much more alert this morning when I saw him than compared to when he was initially admitted.   REVIEW OF SYSTEMS: There is positive fever or fatigue and weakness as well as weight gain of 40 pounds in the past one month. There are no visual or hearing changes. There is some coughing and shortness of breath. There is no chest pain or palpitations. There is no nausea, vomiting, diarrhea, or abdominal pain other than abdominal distention. The rest of the review of symptoms is negative. There is a history of hepatitis C and cirrhosis. Because of his history of depression and psychosis in the past, the  patient was not felt to be a candidate for treatment in the past. Other history includes borderline diabetes and hypertension.   PAST SURGICAL HISTORY: None except for liver biopsy.  FAMILY HISTORY:  Notable for throat cancer and bladder cancer.   HOME MEDICATIONS: Include quetiapine 100 mg 3 times a day, oxycodone every four hours as needed, Prilosec Robles a day in the morning, nitroglycerin as needed, metoprolol 50 mg 1/2 tablet twice a day, lactulose 30 mg 3 times a day, which he has not been taking regularly, Lasix 20 mg Robles a day, alprazolam 1 mg twice a day, escitalopram 10 mg Robles a day.   PHYSICAL EXAMINATION: GENERAL: The patient is in no acute distress.  VITAL SIGNS: Afebrile. Vital signs are stable.  HEAD AND NECK: Exam showed normocephalic/atraumatic.  HEENT:  Pupils are equally reactive. Throat was clear.  NECK: Supple.  CARDIOVASCULAR: Regular rhythm and rate without murmurs.  LUNGS: Showed some crackles at the bases.  ABDOMEN: Distended. I did not really appreciate any tenderness. There is active bowel sounds. There is no hepatomegaly.  EXTREMITIES: Some edema bilaterally.  NEUROLOGIC: Examination is nonfocal.  SKIN: Negative.   LABORATORY, DIAGNOSTIC, AND RADIOLOGICAL DATA: Sodium 134, potassium 4.0, BUN 15, creatinine 0.8. Ammonia level of 39. Liver enzymes showed bilirubin 1.0, alkaline phosphatase 169, AST is 58, hemoglobin 13.2. White count is 9.2. Blood cultures ordered and pending. CT of the head was negative. Chest x-ray showed possible bilateral pneumonia. Doppler ultrasound was negative for any  DVTs.   ASSESSMENT AND PLAN: This is a patient with known history of hepatitis C cirrhosis who comes in with increasing weakness and shortness of breath. He may have pneumonia which may have contributed to the worsening encephalopathy. He appears to have ascites as well. We will need to check his hepatitis C status by checking his current RNA level. We will also need to check  his genotype. The wife thought that he was genotype 2, but I do not have any records to confirm this. Therefore, we will check again. Certainly he is not a candidate for any interferon-based hepatitis C treatment. However, if he may be a candidate for Harvoni, which is not interferon-based treatment. However, Harvoni is only approved for genotype 1 and not for genotype 2. This is why we need to confirm his genotype.   I would recommend doing an ultrasound of the abdomen to evaluate the liver but also check for ascites. If there is significant ascites, therapeutic paracentesis would be useful. In the meantime, will need to increase his Lasix and Aldactone. Also need to give him lactulose on a regular basis to help him improve his mental status. We need to continue to treat his hypertension and pneumonia as well.   Thank you for the referral.  ____________________________ Ray Robles pyo:sg D: 01/30/2014 09:19:59 ET T: 01/30/2014 10:26:32 ET JOB#: [id number]  cc: Ray Robles, <Dictator> Ray Robles ELECTRONICALLY SIGNED 01/31/2014 10:50

## 2015-03-25 NOTE — Discharge Summary (Signed)
PATIENT NAME:  Ray Robles, Ray Robles MR#:  [id number] DATE OF BIRTH:  Jul 05, 1961  DATE OF ADMISSION:  02/02/2014 DATE OF DISCHARGE:  02/03/2014  PRIMARY CARE PHYSICIAN: Dr. [name].  DISCHARGE DIAGNOSES:  1.  Hepatic encephalopathy.  2.  Hyponatremia.  3.  Hypokalemia.  4.  Hypertension.   CODE STATUS: FULL CODE.   CONDITION: Stable.   HOME MEDICATIONS: Please refer to the [facility] physician discharge instruction medication reconciliation list. The patient's alprazolam 1 mg p.o. b.i.d. p.r.n. will be discontinued. We will give alprazolam 0.25 mg one tablet every 8 hours p.r.n. for anxiety.   DIET: Low-sodium diet.   ACTIVITY: As tolerated.   FOLLOW-UP CARE: Follow with PCP within 1 to 2 weeks.   REASON FOR ADMISSION: Weakness, mumbling, and confusion.   HOSPITAL COURSE: The patient is a 54 year old Caucasian male with a history of hepatitis-C-related cirrhosis, hepatic encephalopathy multiple times, esophagitis, hypertension, borderline diabetes, who was sent to ED due to weakness, mumbling, and confusion. The patient's ammonia level was 36.   He was sent to the ED by Dr. Candace Cruise.   ADMISSION LABORATORIES: Sodium 134, potassium 3.4, creatinine 0.93, BUN 12. Liver  function tests showed alkaline phosphatase 216, AST 88. Otherwise, normal. CAT scan of head did not show any acute intracranial pathology.   CHEST X-RAY: No acute disease.   Urinalysis negative.   ASSESSMENT:  1.  Possible hepatic encephalopathy. After admission, the patient has been treated with lactulose. In addition, alprazolam was decreased to 0.25 mg q.8 hours p.r.n. for anxiety. The patient's mental status has improved. The patient is alert, awake, oriented, in no acute distress.  2.  Hyponatremia, improved after normal saline IV.  3.  Hypokalemia. The patient was treated with potassium supplement.  4.  Hypertension has been controlled.   DISCHARGE CONDITION: Clinically stable. Vital signs are stable.   DISPOSITION: To  home today.   I discussed the patient's discharge plan with the patient, nurse, and case manager.   TIME SPENT: About 35 minutes.   ____________________________ Demetrios Loll, MD qc:np D: 02/03/2014 16:44:36 ET T: 02/03/2014 22:53:57 ET JOB#: [id number]  cc: Demetrios Loll, MD, <Dictator> Demetrios Loll MD ELECTRONICALLY SIGNED 02/04/2014 15:56

## 2015-03-25 NOTE — Consult Note (Signed)
Chief Complaint:  Subjective/Chief Complaint Overall better. Breathing better though still coughing. U/S abd did not show any ascites.   VITAL SIGNS/ANCILLARY NOTES: **Vital Signs.:   01-Mar-15 04:20  Vital Signs Type Routine  Temperature Temperature (F) 97.5  Celsius 36.3  Temperature Source oral  Pulse Pulse 72  Respirations Respirations 20  Systolic BP Systolic BP 073  Diastolic BP (mmHg) Diastolic BP (mmHg) 81  Mean BP 95  Pulse Ox % Pulse Ox % 97  Pulse Ox Activity Level  At rest  Oxygen Delivery Room Air/ 21 %   Brief Assessment:  GEN no acute distress   Cardiac Regular   Respiratory clear BS  distended/obese   Lab Results: Routine Micro:  27-Feb-15 16:50   Micro Text Report BLOOD CULTURE   COMMENT                   NO GROWTH IN 18-24 HOURS   ANTIBIOTIC                       Culture Comment NO GROWTH IN 18-24 HOURS  Result(s) reported on 29 Jan 2014 at 05:00PM.   Radiology Results: [place]:    01-Mar-15 08:28, US Abdomen Limited Survey  US Abdomen Limited Survey   REASON FOR EXAM:    worsening abdgirth, cirrhosis. eval  COMMENTS:   Body Site: Gallbladder, Liver, Common Bile Duct; Ascites   search - Abd.    PROCEDURE: [place]  - US ABDOMEN LIMITED SURVEY  - Jan 30 2014  8:28AM     CLINICAL DATA:  Cirrhosis, worsening abdominal girth    EXAM:  US ABDOMEN LIMITED - RIGHT UPPER QUADRANT    COMPARISON:  11/11/2013    FINDINGS:  Gallbladder:  Physiologically distended. No stones or sludge. Mild diffuse wall  thickening up to 3.2 mm. Sonographer reports no sonographicMurphy's  sign however. No pericholecystic fluid.    Common bile duct:    Diameter: 5 mm diameter, unremarkable    Liver:    Homogeneous hepatic echotexture. No discrete hepatic lesions. No  definite evidence of intrahepatic biliary ductal dilatation.    No ascites.   IMPRESSION:  1. Mild gallbladder wall thickening, nonspecific.  2. Unremarkable liver.  3. No ascites  .      Electronically Signed    By: Arne Cleveland M.D.    On: 01/30/2014 09:11         Verified By: Kandis Cocking, M.D.,   Assessment/Plan:  Assessment/Plan:  Assessment Hep C cirrhosis. No ascites on u/s. Wt gain may be due more to medication than cirrhosis worsening.   Plan Since patient seen in GI office only once in 2013 with no mention of hep C genotype being checked then, will order hep C genotype and hep C RNA level today. Continue lasix. Contine Abx for his pneumonia.   Electronic Signatures: Verdie Shire (MD)  (Signed 01-Mar-15 12:04)  Authored: Chief Complaint, VITAL SIGNS/ANCILLARY NOTES, Brief Assessment, Lab Results, Radiology Results, Assessment/Plan   Last Updated: 01-Mar-15 12:04 by Verdie Shire (MD)

## 2015-03-25 NOTE — Consult Note (Signed)
Chief Complaint:  Subjective/Chief Complaint Pt doing ok. Breathing getting better. Less swelling in abdomen and legs.   VITAL SIGNS/ANCILLARY NOTES: **Vital Signs.:   02-Mar-15 13:57  Vital Signs Type Routine  Temperature Temperature (F) 98.4  Celsius 36.8  Temperature Source oral  Pulse Pulse 89  Respirations Respirations 20  Systolic BP Systolic BP 510  Diastolic BP (mmHg) Diastolic BP (mmHg) 90  Mean BP 105  Pulse Ox % Pulse Ox % 94  Pulse Ox Activity Level  At rest  Oxygen Delivery Room Air/ 21 %   Brief Assessment:  GEN no acute distress   Cardiac Regular   Respiratory clear BS   Gastrointestinal Normal   Lab Results: LabObservation:  01-Mar-15 14:38   OBSERVATION Reason for Test  Cardiology:  01-Mar-15 14:38   Echo Doppler REASON FOR EXAM:     COMMENTS:     PROCEDURE: [place] - ECHO DOPPLER COMPLETE(TRANSTHOR)  - Jan 30 2014  2:38PM   RESULT: Echocardiogram Report  Patient Name:   Ray Robles Date of Exam: 01/30/2014 Medical Rec #:  [id number]              Custom1: Date of Birth:  10-May-1961           Height:       70.0 in Patient Age:    75 years            Weight:       207.0 lb Patient Gender: M                   BSA:          2.12 m??  Indications: CHF Sonographer:    Janalee Dane RCS Referring Phys: Vaughan Basta  Summary:  1. Left ventricular ejection fraction, by visual estimation, is 60 to  65%.  2. Normal global left ventricular systolic function.  3. Mild concentric left ventricular hypertrophy.  4. Mildly increased left ventricular septal thickness.  5. Mildly dilated left atrium.  6. Mild mitral valve regurgitation.  7. Mild aortic regurgitation.  8. Mildly increased left ventricular posterior wall thickness.  9. Mild tricuspid regurgitation. 2D AND M-MODE MEASUREMENTS (normal ranges within parentheses): Left Ventricle:          Normal IVSd (2D):      0.94 cm (0.7-1.1) LVPWd (2D):     0.94 cm (0.7-1.1) Aorta/LA:                   Normal LVIDd (2D):     4.27 cm (3.4-5.7) Aortic Root (2D): 2.80 cm (2.4-3.7) LVIDs (2D):     2.71 cm           Left Atrium (2D): 4.00 cm (1.9-4.0) LV FS (2D):     36.5 %   (>25%) LV EF (2D):     66.6 %   (>50%)                                   Right Ventricle:                                   RVd (2D):        7.82 cm LV DIASTOLIC FUNCTION: MV Peak E: 0.94 m/s E/e' Ratio: 7.60 MV Peak A: 0.92 m/s Decel Time: 211 msec E/A Ratio: 1.02 SPECTRAL DOPPLER ANALYSIS (where applicable):  Mitral Valve: MV P1/2 Time: 61.19 msec MV Area, PHT: 3.60 cm?? Tricuspid Valve and PA/RV Systolic Pressure: TR Max Velocity: 2.19 m/s RA  Pressure: 10 mmHg RVSP/PASP: 29.1 mmHg  PHYSICIAN INTERPRETATION: Left Ventricle: The left ventricular internal cavity size was normal. LV  septal wall thickness was mildly increased. LV posterior wall thickness  was mildly increased. Mild concentric left ventricular hypertrophy.  Global LV systolic function was normal. Left ventricular ejection  fraction, by visual estimation, is 60 to 65%. Right Ventricle: The right ventricular size is mildly enlarged. Global RV  systolic function is normal. Left Atrium: The left atrium is mildly dilated. Mitral Valve: The mitral valve is not well seen. Mild mitral valve  regurgitation is seen. Tricuspid Valve: The tricuspid valve is not well seen. Mild tricuspid  regurgitation is visualized. The tricuspid regurgitant velocity is 2.19  m/s, and with an assumed right atrial pressure of 10 mmHg, the estimated  right ventricular systolic pressure is normal at 29.1 mmHg. Aortic Valve: Mild aortic valve regurgitation is seen.  [place] MD Electronically signed by [id number] Bartholome Bill MD Signature Date/Time: 01/31/2014/7:39:31 AM  *** Final ***  IMPRESSION: .   Verified By: Teodoro Spray, M.D., MD   Radiology Results: [place]:    01-Mar-15 08:28, US Abdomen Limited Survey  US Abdomen Limited Survey   REASON FOR EXAM:     worsening abdgirth, cirrhosis. eval  COMMENTS:   Body Site: Gallbladder, Liver, Common Bile Duct; Ascites   search - Abd.    PROCEDURE: [place]  - US ABDOMEN LIMITED SURVEY  - Jan 30 2014  8:28AM     CLINICAL DATA:  Cirrhosis, worsening abdominal girth    EXAM:  US ABDOMEN LIMITED - RIGHT UPPER QUADRANT    COMPARISON:  11/11/2013    FINDINGS:  Gallbladder:  Physiologically distended. No stones or sludge. Mild diffuse wall  thickening up to 3.2 mm. Sonographer reports no sonographicMurphy's  sign however. No pericholecystic fluid.    Common bile duct:    Diameter: 5 mm diameter, unremarkable    Liver:    Homogeneous hepatic echotexture. No discrete hepatic lesions. No  definite evidence of intrahepatic biliary ductal dilatation.    No ascites.   IMPRESSION:  1. Mild gallbladder wall thickening, nonspecific.  2. Unremarkable liver.  3. No ascites .      Electronically Signed    By: Arne Cleveland M.D.    On: 01/30/2014 09:11         Verified By: Kandis Cocking, M.D.,  Cardiology:    01-Mar-15 14:38, Echo Doppler  Echo Doppler   REASON FOR EXAM:      COMMENTS:       PROCEDURE: [facility] - ECHO DOPPLER COMPLETE(TRANSTHOR)  - Jan 30 2014  2:38PM     RESULT: Echocardiogram Report    Patient Name:   Ray Robles Date of Exam: 01/30/2014  Medical Rec #:  [id number]              Custom1:  Date of Birth:  03/04/1961           Height:       70.0 in  Patient Age:    54 years            Weight:       207.0 lb  Patient Gender: M                   BSA:  2.12 m??    Indications: CHF  Sonographer:    Janalee Dane RCS  Referring Phys: Vaughan Basta    Summary:   1. Left ventricular ejection fraction, by visual estimation, is 60 to   65%.   2. Normal global left ventricular systolic function.   3. Mild concentric left ventricular hypertrophy.   4. Mildly increased left ventricular septal thickness.   5. Mildly dilated left atrium.   6. Mild mitral valve  regurgitation.   7. Mild aortic regurgitation.   8. Mildly increased left ventricular posterior wall thickness.   9. Mild tricuspid regurgitation.  2D AND M-MODE MEASUREMENTS (normal ranges within parentheses):  Left Ventricle:          Normal  IVSd (2D):      0.94 cm (0.7-1.1)  LVPWd (2D):     0.94 cm (0.7-1.1) Aorta/LA:                  Normal  LVIDd (2D):     4.27 cm (3.4-5.7) Aortic Root (2D): 2.80 cm (2.4-3.7)  LVIDs (2D):     2.71 cm           Left Atrium (2D): 4.00 cm (1.9-4.0)  LV FS (2D):     36.5 %   (>25%)  LV EF (2D):     66.6 %   (>50%)                                    Right Ventricle:                                    RVd (2D):        0.35 cm  LV DIASTOLIC FUNCTION:  MV Peak E: 0.94 m/s E/e' Ratio: 7.60  MV Peak A: 0.92 m/s Decel Time: 211 msec  E/A Ratio: 1.02  SPECTRAL DOPPLER ANALYSIS (where applicable):  Mitral Valve:  MV P1/2 Time: 61.19 msec  MV Area, PHT: 3.60 cm??  Tricuspid Valve and PA/RV Systolic Pressure: TR Max Velocity: 2.19 m/s RA   Pressure: 10 mmHg RVSP/PASP: 29.1 mmHg    PHYSICIAN INTERPRETATION:  Left Ventricle: The left ventricular internal cavity size was normal. LV   septal wall thickness was mildly increased. LV posterior wall thickness   was mildly increased. Mild concentric left ventricular hypertrophy.   Global LV systolic function was normal. Left ventricular ejection   fraction, by visual estimation, is 60 to 65%.  Right Ventricle: The right ventricular size is mildly enlarged. Global RV   systolic function is normal.  Left Atrium: The left atrium is mildly dilated.  Mitral Valve: The mitral valve is not well seen. Mild mitral valve   regurgitation is seen.  Tricuspid Valve: The tricuspid valve is not well seen. Mild tricuspid   regurgitation is visualized. The tricuspid regurgitant velocity is 2.19   m/s, and with an assumed right atrial pressure of 10 mmHg, the estimated   right ventricular systolic pressure is normal at 29.1  mmHg.  Aortic Valve: Mild aortic valve regurgitation is seen.    [place] MD  Electronically signed by [id number] Bartholome Bill MD  Signature Date/Time: 01/31/2014/7:39:31 AM    *** Final ***    IMPRESSION: .      Verified By: Teodoro Spray, M.D., MD   Assessment/Plan:  Assessment/Plan:  Assessment Hep C/cirrhosis.  Pneumonitis.   Plan Clinically improving. Continue lactulose. Continue diuretics. Will sign off. Pt can f/u with [place] after discharge. Thanks.   Electronic Signatures: Verdie Shire (MD)  (Signed 02-Mar-15 14:28)  Authored: Chief Complaint, VITAL SIGNS/ANCILLARY NOTES, Brief Assessment, Lab Results, Radiology Results, Assessment/Plan   Last Updated: 02-Mar-15 14:28 by Verdie Shire (MD)

## 2015-03-25 NOTE — H&P (Signed)
PATIENT NAME:  SHAYDON, LEASE MR#:  [id number] DATE OF BIRTH:  1961-03-25  DATE OF ADMISSION:  07/09/2014  PRIMARY CARE PHYSICIAN: Dr. Lelon Huh  REFERRING PHYSICIAN: Dr. Cinda Quest  CHIEF COMPLAINT: Frequent fall, nausea, vomiting, and diarrhea.  HISTORY OF PRESENT ILLNESS: The patient is a 54 year old male with a history of cirrhosis secondary to hepatitis C, hypertension, and chronic pain, who comes to the Emergency Department with frequent falls. The patient states that for the last 2 to 3 days, he has been falling down frequently. Has been having nausea, vomiting, and diarrhea, multiple episodes. Unable to keep down any food for the last 2 days. Workup in the Emergency Department, the patient was strongly positive for orthostatic hypotension. The patient is also on multiple sedative medications, oxycodone, Ativan, and quetiapine. The patient has continued to take his lactulose and Lasix. The patient states the patient usually falls down even while walking. Feels generalized weakness in the lower extremities. Denies having any fever. Denies having any abdominal pain. Denies having any sick contacts, eating any leftover food or eating somewhere outside foods. Denies any recent travel.   PAST MEDICAL HISTORY:  1.  Cirrhosis secondary to hepatitis C. 2.  Hepatitis C. 3.  Multiple admissions for hepatic encephalopathy. 4.  Hypertension. 5.  Borderline diabetes mellitus. 6.  Depression. 7.  History of herniated disk. 8.  Chronic pain.   PAST SURGICAL HISTORY: Liver biopsy.  ALLERGIES:  1.  TYLENOL. 2.  MORPHINE. 3.  IBUPROFEN.   HOME MEDICATIONS:  1.  Quetiapine 300 mg at bedtime. 2.  Oxycodone 30 mg every 4 hours as needed. 3.  Omeprazole 20 mg once a day. 4.  Nitroglycerin 0.4 mg sublingual every 5 minutes as needed. 5.  Metoprolol 25 mg 3 a day. 6.  Lactulose 30 mL 3 times a day. 7.  Lasix 20 mg once a day. 8.  Escitalopram 10 mg once a day. 9.  Alprazolam 0.25 mg every 8  hours as needed.     SOCIAL HISTORY: Former smoker. Denies drinking alcohol. Used drugs in the past. Lives with his wife and children.  FAMILY HISTORY: Mother with throat cancer and father with bladder cancer.  REVIEW OF SYSTEMS:  CONSTITUTIONAL: Experiencing generalized weakness. EYES: No change in vision. ENT: No change in hearing. RESPIRATORY: No cough, shortness of breath. CARDIOVASCULAR: No chest pain, palpitations. GASTROINTESTINAL: Has nausea, vomiting, and diarrhea. GENITOURINARY: No dysuria or hematuria. HEMATOLOGIC: No easy bruising or bleeding. SKIN: No rash or lesions. MUSCULOSKELETAL: Weakness in the lower extremities. NEUROLOGIC: No weakness or numbness or numbness in any part of the body.  PHYSICAL EXAMINATION: GENERAL: This is a well-built, well-nourished, chronically ill-looking male, lying down in bed, not in distress. VITAL SIGNS: Temperature 97.7, pulse 84, blood pressure 152/90, respiratory rate of 20, oxygen saturation 98% on room air. HEENT: Head normocephalic, atraumatic. There is no scleral icterus. Conjunctivae normal. Pupils are equal, round, and reactive to light. Mucous membranes with mild dryness. No pharyngeal erythema. NECK: Supple. No lymphadenopathy. No JVD. No carotid bruit. CHEST: No focal tenderness. LUNGS: Bilaterally clear to auscultation. HEART: S1 and S2. Regular. No murmurs are heard. ABDOMEN: Bowel sounds present. Soft, nontender, nondistended. EXTREMITIES: No pedal edema. Pulses 2+. SKIN: No rash or lesions. MUSCULOSKELETAL: No joint pains and aches. NEUROLOGIC: The patient is alert, oriented to place, person, and time. Cranial nerves II through XII are intact. Motor 5/5 in the upper and lower extremities.  LABORATORY DATA: Urine drug screen is positive for cannabinoids. CBC is completely  within normal limits. CMP: Potassium of 2.8; the rest of the values are within normal limits. Mild elevation of the liver enzymes: ALT 90, AST 102. Chest  x-ray, one view, portable: No acute cardiopulmonary disease. CT of the head without contrast: No acute intracranial abnormality. TSH is 0.617.  ASSESSMENT AND PLAN: The patient is a 54 year old male, comes to the Emergency Department with complaints of frequent falls, nausea, vomiting, and diarrhea. 1.  Nausea, vomiting, and diarrhea. It is possible it is as gastroenteritis. Keep the patient on a clear-liquid diet. Hold the lactulose, Lasix and keep the patient on IV fluids. Provide antinausea medications. Considering the patient's frequent admissions to the hospital, will also check the Clostridium difficile toxin. 2.  Orthostatic hypotension. Continue with IV fluids and follow up. 3.  Chronic pain. We will hold the oxycodone, as the patient seems to be slightly confused. Follow up.  4.  Hypertension. Will hold all blood pressure medications. 5.  Hepatic encephalopathy. Hold the Lasix and the lactulose and follow up. 6.  Keep the patient on deep venous thrombosis prophylaxis with Lovenox.  TIME SPENT: 50 minutes.     ____________________________ Monica Becton, MD pv:cg D: 07/09/2014 04:22:52 ET T: 07/09/2014 06:14:47 ET JOB#: [id number]  cc: Monica Becton, MD, <Dictator> Monica Becton MD ELECTRONICALLY SIGNED 07/10/2014 20:58

## 2015-03-25 NOTE — H&P (Signed)
PATIENT NAME:  Ray Robles, Ray Robles MR#:  [id number] DATE OF BIRTH:  [birth year]  DATE OF ADMISSION:  01/28/2014  Addendum  HOME MEDICATIONS:  1.  Quetiapine 100 mg oral 3 tablets once a day.  2.  Oxycodone 15 mg oral 2 tablets every 4 hours as needed for pain.  3.  Omeprazole 20 mg oral once a day in the morning.  4.  Nitroglycerin 0.4 mg sublingual tablet every 5 minutes as needed for chest pain, maximum of 3 doses.  5.  Metoprolol 50 mg oral tablet, take 1/2 tablet 2 times a day.  6.  Lactulose take 30 mL 3 times a day.  7.  Furosemide 20 mg oral tablet once a day.  8.  Escitalopram 10 mg oral once a day.  9.  Alprazolam 1 mg oral tablet 2 times a day.    ____________________________ Ceasar Lund Anselm Jungling, MD vgv:dmm D: 01/28/2014 19:38:00 ET T: 01/28/2014 20:13:53 ET JOB#: [id number]  cc: Ceasar Lund. Anselm Jungling, MD, <Dictator> Vaughan Basta MD ELECTRONICALLY SIGNED 02/15/2014 22:40

## 2015-03-25 NOTE — Discharge Summary (Signed)
PATIENT NAME:  Ray Robles, Ray Robles MR#:  [id number] DATE OF BIRTH:  05-02-1961  DATE OF ADMISSION:  07/09/2014 DATE OF DISCHARGE:  07/10/2014  PRESENTING COMPLAINT: Weakness and fall along with nausea, vomiting and diarrhea.   DISCHARGE DIAGNOSES: 1.  Acute gastroenteritis.  2.  Generalized weakness.   CODE STATUS: FULL.  CONDITION ON DISCHARGE: Fair.   DISCHARGE MEDICATIONS: 1.  Lasix 20 mg p.o. daily as needed for leg swelling.  2.  Metoprolol 50 mg 1/2 tablet which is 25 mg b.i.d.  3.  Nitroglycerin 0.4 mg sublingual as needed.  4.  Omeprazole 20 mg daily.  5.  Escitalopram 10 mg daily.  6.  Oxycodone 15 mg 2 tablets that is 30 mg every 4 hours as needed.  7.  Quetiapine 100 mg 3 tablets p.o. daily at bedtime.  8.  Alprazolam 0.25 one tablet every 8 hours as needed.  9.  Lactulose 30 mL 3 times a day, resume once your diarrhea resolves.  10.  Lomotil 0.025/2.5 two tablets every 8 hours as needed for diarrhea.  11.  Nystatin apply to effected area 3 times a day.   DISCHARGE INSTRUCTIONS: 1.  Physical therapy.  2.  Frontal walker.  3.  Follow up with Dr. Caryn Robles in 1 to 2 weeks.  DIAGNOSTIC DATA: Labs at discharge: Creatinine 0.79, potassium 3.7, sodium 142. C. diff negative. Plasma ammonia 29. UA negative for UTI. Urine drug screen positive for cannabinoids.   CT of the head without contrast shows no acute intracranial abnormality.   CBC within normal limits, except platelet count of 116.   VITALS AT DISCHARGE: Afebrile. Pulse 64, blood pressure 121/61, and sats are 95% on room air.   BRIEF SUMMARY OF HOSPITAL COURSE: Ms. Vue is a 54 year old Caucasian gentleman who came in with nausea, vomiting, diarrhea and frequent falls. He presented with:  1.  Acute gastroenteritis. The patient was started on IV fluids, given clear liquid diet and tolerated a regular diet at discharge. His C. diff was negative. His diarrhea was slowing down. He was given p.r.n. Lomotil. 2.   Orthostatic hypotension. Received IV fluids. Blood pressure remained stable thereafter.  3.  Chronic pain syndrome. Resumed oxycodone. The patient's mentation was normal.  4.  History of hepatic encephalopathy. The patient's ammonia was within normal limits. His mentation improved and was stable prior to discharge.   Overall hospital stay otherwise remained stable. The patient's physical examination on discharge remained stable.   TIME SPENT: 40 minutes.   ____________________________ Ray Rochester Posey Pronto, MD sap:sb D: 07/11/2014 07:01:54 ET T: 07/11/2014 07:17:57 ET JOB#: [id number]  cc: Ray Hanners A. Posey Pronto, MD, <Dictator> Ray Peri. [name], MD Ilda Basset MD ELECTRONICALLY SIGNED 07/13/2014 13:51
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
## 2015-03-25 NOTE — Discharge Summary (Signed)
PATIENT NAME:  Ray Robles, Ray Robles MR#:  [id number] DATE OF BIRTH:  [birth year]  DATE OF ADMISSION:  01/28/2014 DATE OF DISCHARGE:  01/31/2014  CONSULTANTS:  1. Lupita Dawn. Oh, MD, from GI. 2. Surya K. Franchot Mimes, MD, from psychiatry.   PRIMARY CARE PHYSICIAN: Kirstie Peri. Fisher, MD   CHIEF COMPLAINT: Cough, weight gain and altered mental status.   DISCHARGE DIAGNOSES:  1. Interstitial pneumonia/pneumonitis.  2. Chronic hepatitis C with cirrhosis.  3. Depression.  4. Suspected acute on chronic hepatic encephalopathy. 5. History of gastritis with esophagitis.  6. History of hepatic encephalopathy, multiple times.  7. Borderline diabetes.  8. Herniated disk.  9. Chronic pain.  10. Hypertension.   DISCHARGE MEDICATIONS:  1. Alprazolam 1 mg 1 tab 2 times a day as needed for anxiety.  2. Furosemide 20 mg once a day as needed for swelling.  3. Metoprolol tartrate 50 mg 1/2 tab 2 times a day.  4. Nitroglycerin p.r.n. sublingual.  5. Omeprazole 20 mg once a day.  6. Lactulose 30 mL 3 times a day, need to have 3 bowel movements per day.  7. Escitalopram 10 mg once a day.  8. Oxycodone 15 mg 2 tabs every 4 hours as needed for pain.  9. Quetiapine 100 mg 3 tabs once a day at bedtime.  10. Azithromycin 500 mg daily for 5 days. 11. Cefpodoxime 200 mg every 12 hours for 5 days.   DIET: Low sodium, low fat, low cholesterol.   ACTIVITY: As tolerated.   FOLLOWUP: Please follow with PCP and Dr. Candace Cruise within 1 to 2 weeks. Please follow with your psychiatrist within 1 to 2 weeks.   DISPOSITION: Home.   SIGNIFICANT LABORATORY DATA AND IMAGING: Echocardiogram showing normal EF of 60% to 65%. Legionella urine antigen negative. Rapid flu negative. Blood cultures: No growth to date. Initial white count of 9.2, hemoglobin 13.2. Initial troponin negative. Ammonia level was elevated at 39 on admission. Initial sodium 134. CT of head without contrast showing no acute intracranial abnormalities. X-ray of the chest, PA  and lateral, showing diffuse interstitial infiltrate, infectious or inflammatory, pulmonary edema is of lower differential consideration. Ultrasound of lower extremities bilaterally shows no evidence of DVT. Ultrasound of the abdomen, limited survey, showing mild gallbladder wall thickening, nonspecific, unremarkable liver, no ascites.   HISTORY OF PRESENT ILLNESS AND HOSPITAL COURSE: For full details of H and P, please see the dictation on February 27th by Dr. Anselm Jungling, but briefly, this is a 54 year old with chronic hepatitis C, cirrhosis of the liver, with multiple bouts of hepatic encephalopathy, esophagitis, who comes in because of the above chief complaint. Of note, there were some adjustments to his outpatient depression medications from Lexapro to Seroquel because of nightmares. The patient had some weight gain, what he described as about 40 pounds, and therefore was admitted to the hospitalist service after x-ray of the chest showed possible pneumonia. He also, per wife, was more lethargic and drowsy and had mildly elevated ammonia, and therefore likely has another bout of hepatic encephalopathy.   In regards to the shortness of breath and cough, x-ray of the chest shows more of an interstitial pneumonia/pneumonitis type of a picture, and has had no fevers or significant leukocytosis. A BNP was checked which was low, making the possibility of CHF less likely. Given his increased abdominal girth and his subjective complaints of lower extremity edema and weight gain, he was started on Lasix IV as well. I suspect that his shortness of breath is likely secondary  to interstitial pneumonia/pneumonitis, and he will be discharged on a couple of antibiotics for a total of another 5 days of therapy. We did check an echocardiogram, which did not show any evidence for CHF. Furthermore, we did an ultrasound of abdomen, which showed no significant ascites. We have stopped the Seroquel, which could have been causing  the weight gain, as I do not think it is CHF. GI has been on board. His mentation has significantly improved, and I suspect the patient had an element of acute hepatic encephalopathy, and he is better on the lactulose. He is awake, alert, oriented x3. He was seen by psychiatry for depression, and at this point, he will be discharged without Seroquel, and he will follow with Dr. Weber Cooks as an outpatient. He already has an appointment with Dr. [name] later this week.   Of note, in regards to his chronic hepatitis, we have sent hepatitis C genotype and RNA, and he will follow with Dr. Candace Cruise for followup.   PHYSICAL EXAMINATION:  VITAL SIGNS: On the day of discharge, his temperature is 98.4, pulse rate 89, respiratory rate 20, blood pressure 135/90, O2 saturation 94%.  GENERAL: The patient is a well-developed male lying in bed, in no obvious distress.  HEENT: Normocephalic, atraumatic. Pupils are equal. Moist mucous membranes.  NECK: Supple.  LUNGS: No significant wheezing, rhonchi or crackles on examination of the lungs.  HEART: Normal S1, S2. ABDOMEN: No significant tenderness on abdominal exam. There is no distention and no pitting edema.   TOTAL TIME SPENT ON THIS DISCHARGE: 38 minutes.    ____________________________ Vivien Presto, MD sa:lb D: 01/31/2014 14:07:52 ET T: 02/01/2014 08:52:45 ET JOB#: [id number]  cc: Vivien Presto, MD, <Dictator> [place] Candace Cruise, MD Kirstie Peri [name], MD Gonzella Lex, MD Vivien Presto MD ELECTRONICALLY SIGNED 02/04/2014 13:05

## 2015-03-25 NOTE — H&P (Signed)
PATIENT NAME:  Ray Robles, Ray Robles MR#:  [id number] DATE OF BIRTH:  1961-02-01  DATE OF ADMISSION:  02/02/2014  REFERRING PHYSICIAN: Dr. Robet Leu.   PRIMARY CARE PHYSICIAN: Dr. [name].   CHIEF COMPLAINT: Weakness, mumbling, confusion.   HISTORY OF PRESENT ILLNESS: The patient is a pleasant 54 year old male well known to me from a recent hospitalization, during which he got discharged by me 2 days ago. At that time, he came in for shortness of breath and interstitial pneumonia, suspected acute on chronic hepatic encephalopathy. The patient was discharged with p.o. antibiotics including azithromycin and Vantin to be taken for another 5 days, in addition to his lactulose. It is unclear if he really took his lactulose. The wife and the sone are in the room. Apparently, the patient went and filled the prescription yesterday, but they are not sure if he actually took any. He states that he took them, but is not himself and a little confused. He had gone to Dr. Myrna Blazer office, who recommended him to come to the hospital because he was unstable on his feet and confused. Here, he was noted to have an ammonia level of 36. Hospitalist services are contacted for further evaluation and management.   PAST MEDICAL HISTORY: History of hep C-related cirrhosis, history of hepatic encephalopathy multiple times, esophagitis, gastritis, hypertension, borderline diabetes, depression, history of herniated disk, chronic pain, and recent admission and got discharged a couple of days ago for interstitial pneumonia/pneumonitis.   PAST SURGICAL HISTORY: History of liver biopsy several months ago.   SOCIAL HISTORY: No tobacco. No alcohol. Did IV drug abuse 20 years ago.   FAMILY HISTORY: Positive for mom with throat cancer, father with bladder cancer.   ALLERGIES: TYLENOL, MORPHINE, AND IBUPROFEN.   HOME MEDICATIONS: Alprazolam 1 mg 1 tab 2 times a day p.r.n. but he takes it 2 times a day, azithromycin 500 mg for another 3 days,  cefpodoxime 200 mg every 12 hours for an additional 3 days, citalopram 10 mg daily, furosemide 20 mg p.r.n. daily, lactulose 30 mL 3 times a day (unclear if he is really taking it or not), metoprolol 25 mg 2 times a day, nitroglycerin sublingual p.r.n. for pain in the chest, omeprazole 20 mg once a day, oxycodone 30 mg every 4 hours as needed, quetiapine 100 mg 3 tabs once a day at bedtime.   REVIEW OF SYSTEMS:   CONSTITUTIONAL: Denies any weight loss since last discharge or weight gain. No fever, fatigue.  EYES: Denies blurry vision or double vision.  ENT: Denies tinnitus or hearing loss.  RESPIRATORY: Has a cough, occasionally productive. No shortness of breath or wheezing.  CARDIOVASCULAR: No chest pain or swelling in the legs.  GASTROINTESTINAL: Has a history of hep C, cirrhosis, and hepatic encephalopathy. Denies any abdominal pain.  GENITOURINARY: Denies dysuria.  HEMATOLOGIC AND LYMPHATIC: No anemia or easy bruising. SKIN: Denies any rashes.  MUSCULOSKELETAL: Has chronic pain.  NEUROLOGIC: Global weakness. No focal weakness.  PSYCHIATRIC: Has history of depression, anxiety, and history of suicidal ideation in the past.   PHYSICAL EXAMINATION: VITAL SIGNS: Temperature on arrival 98.5, pulse rate 109, respirations 19, blood pressure 135/81, O2 sat 96% on room air.  GENERAL: The patient is a well-developed male lying in bed.  HEENT: Normocephalic, atraumatic. Pupils are equal and reactive. Somewhat dry mucous membranes.  NECK: Supple. No thyroid tenderness. No cervical lymphadenopathy.  CARDIOVASCULAR: S1, S2, tachycardic. No significant murmurs appreciated.  LUNGS: Clear to auscultation without wheezing, rhonchi, or rales.  ABDOMEN: Soft,  nontender, nondistended. Positive bowel sounds in all quadrants.  EXTREMITIES: No pitting edema.  NEUROLOGIC: Cranial nerves II through XII appear to be grossly intact. Strength is 5 out of 5 in all extremities. Sensation is intact to light touch.   PSYCHIATRIC: Awake, alert, oriented x 2. He is lethargic. Answers most questions, but per wife is not at baseline.  SKIN: No obvious rashes.   LABORATORY AND RADIOLOGICAL DATA: Sodium 134, potassium 3.4, creatinine 0.93, BUN 12, glucose 106. Ammonia 36. LFTs show alkaline phosphatase of 216, AST 88, otherwise LFTs within normal limits. White count of 5, hemoglobin 12.8, platelets 158. Urinalysis is not suggestive of infection. CT of head without contrast showing no acute intracranial pathology. X-ray of the chest, PA and lateral, showing no acute disease.   ASSESSMENT AND PLAN: We have a 54 year old with recent admission for interstitial pneumonia/pneumonitis, acute on chronic hepatic encephalopathy, comes in with again not himself, weakness, fall, and confusion, likely hepatic encephalopathy as I do not think that he was taking his lactulose as prescribed. Although his ammonia level is not significantly elevated, ammonia at times does not track with the level of encephalopathy. The CAT scan of the head is negative for acute stroke. We would start him on lactulose 30 mg q.6 hours and some gentle fluids. He appears to be somewhat dehydrated. Do not know if he is really eating much. I do not think that this is a pneumonia, as he is recovering from that and today's x-ray of the chest is clear. I would continue the azithromycin and start the patient on cefdinir, and these are to be continued for another 3 days. I would also cut back on his alprazolam and this time, he should not be discharged on 1 mg 2 times a day p.r.n. as previous, as I do not know if he is taking more than he should. I discussed the case with the family and the wife extensively to do pill counts to make sure he is not taking excessive opiates or benzodiazepines, and they were going to go home and do that. I would also hold the quetiapine. He is not suicidal. He will be following with Dr. Weber Cooks as an outpatient about his depression. I would  continue the Lexapro. He has mild hypokalemia, which I would replete.   CODE STATUS: The patient is full code.   TOTAL TIME SPENT: 45 minutes.    ____________________________ Vivien Presto, MD sa:jcm D: 02/02/2014 20:01:07 ET T: 02/02/2014 20:28:30 ET JOB#: [id number]  cc: Vivien Presto, MD, <Dictator> [place] [name], MD Vivien Presto MD ELECTRONICALLY SIGNED 02/27/2014 14:32
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
## 2015-03-25 NOTE — Consult Note (Signed)
Pt seen and examined. Full consult to follow. Known  hx of Hep C cirrhosis. Never treated for Hep C due to his depression/psychosis in the past. Now with pneumonia with worsening hepatic encephalopathy. Pt not compliant with his lactulose recently. Also, increasing periph edema and abd girth. According to wife, patient much more alert this AM. Need abdominal U/S to assess liver but also for ascites. Continue reg lactulose. Treat his pneumonia. Assess his current hep C status. Newly approved Harvoni does not include interferon. However, only approved for genotype 1. He may be genotype 2. Will need to review his old records. Will follow. Thanks.  Electronic Signatures: Verdie Shire (MD)  (Signed on 28-Feb-15 11:46)  Authored  Last Updated: 28-Feb-15 11:46 by Verdie Shire (MD)

## 2015-03-27 LAB — SURGICAL PATHOLOGY

## 2015-03-29 ENCOUNTER — Inpatient Hospital Stay
Admit: 2015-03-29 | Discharge: 2015-04-01 | Disposition: A | Discharge status: Home / Self Care | Payer: Medicare Other | Attending: Internal Medicine | Admitting: Internal Medicine

## 2015-03-29 DIAGNOSIS — R4182 Altered mental status, unspecified: Secondary | ICD-10-CM | POA: Diagnosis not present

## 2015-03-29 DIAGNOSIS — R410 Disorientation, unspecified: Secondary | ICD-10-CM | POA: Diagnosis not present

## 2015-03-29 DIAGNOSIS — I10 Essential (primary) hypertension: Secondary | ICD-10-CM | POA: Diagnosis not present

## 2015-03-29 DIAGNOSIS — K7290 Hepatic failure, unspecified without coma: Secondary | ICD-10-CM | POA: Diagnosis not present

## 2015-03-29 DIAGNOSIS — S0990XA Unspecified injury of head, initial encounter: Secondary | ICD-10-CM | POA: Diagnosis not present

## 2015-03-29 DIAGNOSIS — K7200 Acute and subacute hepatic failure without coma: Secondary | ICD-10-CM | POA: Diagnosis not present

## 2015-03-29 DIAGNOSIS — R5383 Other fatigue: Secondary | ICD-10-CM | POA: Diagnosis not present

## 2015-03-29 DIAGNOSIS — K219 Gastro-esophageal reflux disease without esophagitis: Secondary | ICD-10-CM | POA: Diagnosis not present

## 2015-03-29 LAB — CBC
HCT: 39 % — ABNORMAL LOW (ref 40.0–52.0)
HGB: 13.4 g/dL (ref 13.0–18.0)
MCH: 31.2 pg (ref 26.0–34.0)
MCHC: 34.3 g/dL (ref 32.0–36.0)
MCV: 91 fL (ref 80–100)
PLATELETS: 185 10*3/uL (ref 150–440)
RBC: 4.29 10*6/uL — AB (ref 4.40–5.90)
RDW: 13.7 % (ref 11.5–14.5)
WBC: 9.2 10*3/uL (ref 3.8–10.6)

## 2015-03-29 LAB — BASIC METABOLIC PANEL
Anion Gap: 9 (ref 7–16)
BUN: 24 mg/dL — ABNORMAL HIGH
Calcium, Total: 8.8 mg/dL — ABNORMAL LOW
Chloride: 104 mmol/L
Co2: 23 mmol/L
Creatinine: 1.06 mg/dL
EGFR (African American): >60
EGFR (Non-African Amer.): >60
Glucose: 156 mg/dL — ABNORMAL HIGH
Potassium: 3.6 mmol/L
Sodium: 136 mmol/L

## 2015-03-29 LAB — URINALYSIS, COMPLETE
BLOOD: NEGATIVE
Bacteria: NONE SEEN
Bilirubin,UR: NEGATIVE
GLUCOSE, UR: NEGATIVE mg/dL (ref 0–75)
Ketone: NEGATIVE
Leukocyte Esterase: NEGATIVE
Nitrite: NEGATIVE
PH: 5 (ref 4.5–8.0)
SPECIFIC GRAVITY: 1.027 (ref 1.003–1.030)
Squamous Epithelial: NONE SEEN

## 2015-03-29 LAB — AMMONIA: Ammonia, Plasma: 47 umol/L — ABNORMAL HIGH

## 2015-03-30 DIAGNOSIS — G9340 Encephalopathy, unspecified: Secondary | ICD-10-CM | POA: Diagnosis not present

## 2015-03-30 DIAGNOSIS — G9341 Metabolic encephalopathy: Secondary | ICD-10-CM | POA: Diagnosis not present

## 2015-03-30 DIAGNOSIS — B1921 Unspecified viral hepatitis C with hepatic coma: Secondary | ICD-10-CM | POA: Diagnosis present

## 2015-03-30 DIAGNOSIS — R410 Disorientation, unspecified: Secondary | ICD-10-CM | POA: Diagnosis not present

## 2015-03-30 DIAGNOSIS — K7290 Hepatic failure, unspecified without coma: Secondary | ICD-10-CM | POA: Diagnosis not present

## 2015-03-30 DIAGNOSIS — R4182 Altered mental status, unspecified: Secondary | ICD-10-CM | POA: Diagnosis not present

## 2015-03-30 DIAGNOSIS — M545 Low back pain: Secondary | ICD-10-CM | POA: Diagnosis present

## 2015-03-30 DIAGNOSIS — Z9114 Patient's other noncompliance with medication regimen: Secondary | ICD-10-CM | POA: Diagnosis present

## 2015-03-30 DIAGNOSIS — M1990 Unspecified osteoarthritis, unspecified site: Secondary | ICD-10-CM | POA: Diagnosis present

## 2015-03-30 DIAGNOSIS — B1920 Unspecified viral hepatitis C without hepatic coma: Secondary | ICD-10-CM | POA: Diagnosis not present

## 2015-03-30 DIAGNOSIS — Z8249 Family history of ischemic heart disease and other diseases of the circulatory system: Secondary | ICD-10-CM | POA: Diagnosis not present

## 2015-03-30 DIAGNOSIS — Z885 Allergy status to narcotic agent status: Secondary | ICD-10-CM | POA: Diagnosis not present

## 2015-03-30 DIAGNOSIS — K219 Gastro-esophageal reflux disease without esophagitis: Secondary | ICD-10-CM | POA: Diagnosis not present

## 2015-03-30 DIAGNOSIS — Z79899 Other long term (current) drug therapy: Secondary | ICD-10-CM | POA: Diagnosis not present

## 2015-03-30 DIAGNOSIS — E119 Type 2 diabetes mellitus without complications: Secondary | ICD-10-CM | POA: Diagnosis not present

## 2015-03-30 DIAGNOSIS — E1140 Type 2 diabetes mellitus with diabetic neuropathy, unspecified: Secondary | ICD-10-CM | POA: Diagnosis not present

## 2015-03-30 DIAGNOSIS — K7469 Other cirrhosis of liver: Secondary | ICD-10-CM | POA: Diagnosis not present

## 2015-03-30 DIAGNOSIS — G8929 Other chronic pain: Secondary | ICD-10-CM | POA: Diagnosis present

## 2015-03-30 DIAGNOSIS — K7200 Acute and subacute hepatic failure without coma: Secondary | ICD-10-CM | POA: Diagnosis not present

## 2015-03-30 DIAGNOSIS — Z886 Allergy status to analgesic agent status: Secondary | ICD-10-CM | POA: Diagnosis not present

## 2015-03-30 DIAGNOSIS — Z79891 Long term (current) use of opiate analgesic: Secondary | ICD-10-CM | POA: Diagnosis not present

## 2015-03-30 DIAGNOSIS — E860 Dehydration: Secondary | ICD-10-CM | POA: Diagnosis present

## 2015-03-30 DIAGNOSIS — S0990XA Unspecified injury of head, initial encounter: Secondary | ICD-10-CM | POA: Diagnosis not present

## 2015-03-30 DIAGNOSIS — F329 Major depressive disorder, single episode, unspecified: Secondary | ICD-10-CM | POA: Diagnosis present

## 2015-03-30 DIAGNOSIS — I10 Essential (primary) hypertension: Secondary | ICD-10-CM | POA: Diagnosis not present

## 2015-03-30 DIAGNOSIS — R5383 Other fatigue: Secondary | ICD-10-CM | POA: Diagnosis not present

## 2015-03-30 LAB — HEPATIC FUNCTION PANEL A (ARMC)
ALBUMIN: 3.2 g/dL — AB
ALK PHOS: 160 U/L — AB
BILIRUBIN DIRECT: 0.2 mg/dL
BILIRUBIN TOTAL: 0.5 mg/dL
Indirect Bilirubin: 0.3
SGOT(AST): 77 U/L — ABNORMAL HIGH
SGPT (ALT): 54 U/L
Total Protein: 6.6 g/dL

## 2015-03-30 LAB — AMMONIA: Ammonia, Plasma: 40 umol/L — ABNORMAL HIGH

## 2015-03-31 LAB — AMMONIA: AMMONIA, PLASMA: 40 umol/L — AB

## 2015-03-31 LAB — WBCS, STOOL

## 2015-04-01 LAB — AMMONIA: Ammonia, Plasma: 27 umol/L

## 2015-04-01 MED ORDER — DIPHENHYDRAMINE HCL 25 MG PO CAPS: 25.0000 mg | ORAL_CAPSULE | Freq: Every day | ORAL | Status: DC

## 2015-04-01 MED ORDER — FUROSEMIDE 20 MG PO TABS: 20.0000 mg | ORAL_TABLET | Freq: Every day | ORAL | Status: DC

## 2015-04-01 MED ORDER — HYOSCYAMINE SULFATE 0.125 MG SL SUBL
0.1250 mg | SUBLINGUAL_TABLET | Freq: Two times a day (BID) | SUBLINGUAL | Status: DC | PRN
  Filled 2015-04-01: qty 1

## 2015-04-01 MED ORDER — METOPROLOL TARTRATE 25 MG PO TABS: 25.0000 mg | ORAL_TABLET | Freq: Two times a day (BID) | ORAL | Status: DC

## 2015-04-01 MED ORDER — SODIUM CHLORIDE 0.9 % IJ SOLN: 3.0000 mL | INTRAMUSCULAR | Status: DC | PRN

## 2015-04-01 MED ORDER — RIFAXIMIN 550 MG PO TABS: 550.0000 mg | ORAL_TABLET | Freq: Two times a day (BID) | ORAL | Status: DC

## 2015-04-01 MED ORDER — PANTOPRAZOLE SODIUM 40 MG PO TBEC: 40.0000 mg | DELAYED_RELEASE_TABLET | Freq: Every day | ORAL | Status: DC

## 2015-04-01 MED ORDER — OXYCODONE HCL 5 MG PO TABS: 30.0000 mg | ORAL_TABLET | ORAL | Status: DC | PRN

## 2015-04-01 MED ORDER — INSULIN ASPART 100 UNIT/ML ~~LOC~~ SOLN: 0.0000 [IU] | Freq: Three times a day (TID) | SUBCUTANEOUS | Status: DC

## 2015-04-01 MED ORDER — ONDANSETRON HCL 4 MG/2ML IJ SOLN: 4.0000 mg | INTRAMUSCULAR | Status: DC | PRN

## 2015-04-01 MED ORDER — LACTULOSE 10 GM/15ML PO SOLN: 20.0000 g | Freq: Three times a day (TID) | ORAL | Status: DC

## 2015-04-01 MED ORDER — SPIRONOLACTONE 100 MG PO TABS: 50.0000 mg | ORAL_TABLET | Freq: Every day | ORAL | Status: DC | PRN

## 2015-04-01 MED ORDER — ENOXAPARIN SODIUM 40 MG/0.4ML ~~LOC~~ SOLN: 40.0000 mg | SUBCUTANEOUS | Status: DC

## 2015-04-02 ENCOUNTER — Encounter: Payer: Self-pay | Admitting: Anesthesiology

## 2015-04-02 NOTE — H&P (Signed)
PATIENT NAME:  Ray Robles, Ray Robles MR#:  [id number] DATE OF BIRTH:  1961-06-01  DATE OF ADMISSION:  03/30/2015  REFERRING PHYSICIAN: Larae Grooms, MD  PRIMARY CARE PRACTITIONER: Kirstie Peri. [name], MD  GASTROENTEROLOGIST: Lupita Dawn. Candace Cruise, MD  ADMITTING PHYSICIAN: Juluis Mire, MD   CHIEF COMPLAINT: Increasing lethargy with confusion for the past 3-4 days.    HISTORY OF PRESENT ILLNESS: A 54 year old Caucasian male with a history of hepatitis C and hepatic encephalopathy, hypertension, diabetes mellitus type 2, gastroesophageal reflux disease, osteoarthritis, was brought to the Emergency Room by his wife with the complaints of found increasing lethargic altered sensorium ongoing for the past 3-4 days. According to the patient's wife, the patient stated he is not very compliant with his lactulose and has been having increased confusion with lethargy and has been having some falls secondary to lethargy, hence brought to the Emergency Room for further evaluation. Denies any history of fever. No nausea, no vomiting, no diarrhea. No chest pain. No shortness of breath. No abdominal pain. In the Emergency Room, the patient was evaluated by the ED physician and was found to be lethargic but otherwise arousable and workup revealed elevated serum ammonia level of 47. A CT scan of the head was done in the ED, which is negative for any acute intracranial pathology. Because of his lethargy and altered sensorium, the hospitalist service was consulted for further management. At the current time, the patient is drowsy and sleepy but arousable and answering questions appropriately.    PAST MEDICAL HISTORY:  1.  Hepatitis C.  2.  Hepatic encephalopathy.  3.  Hypertension.  4.  Diabetes mellitus, type 2.  5.  Gastroesophageal reflux disease.   6.  Osteoarthritis.   PAST SURGICAL HISTORY:  1.  Hernia repair.  2.  Esophageal dilation.   ALLERGIES: MORPHINE, IBUPROFEN, TYLENOL.   SOCIAL HISTORY: He is married,  lives with his wife at home. Negative for alcohol or substance abuse. No history of smoking.   FAMILY HISTORY: Positive for hypertension and coronary artery disease but otherwise unremarkable.   HOME MEDICATIONS:  1.  Furosemide 20 mg 1 tablet orally once a day.  2.  Hyoscyamine 0.125 mg oral tablet 1 tablet orally every 4 hours as needed for abdominal pain.  3.  Lactulose 30 mL orally 3 times a day.  4.  Metoprolol tartrate 25 mg 1 tablet orally 2 times a day.  5.  Nitrostat 0.4 mg sublingual tablet 1 tablet every 5 minutes as needed for chest pain.  6.  Omeprazole 20 mg 1 capsule orally once a day.  7.  Ondansetron 4 mg tablet 1 tablet orally every 8 hours as needed for nausea or vomiting.  8.  Oxycodone 15 mg 2 tablets orally every 4 hours as needed for pain.  9.  Spironolactone 50 mg 1 tablet orally once a day as needed for edema.  10.  Xifaxan 550 mg 1 tablet orally 2 times a day.   REVIEW OF SYSTEMS:  CONSTITUTIONAL: Negative for fever or chills. Positive for fatigue with generalized weakness and lethargy.  EYES: Negative for blurred vision, double vision. No pain. No redness. No discharge.  EARS, NOSE, AND THROAT: Negative for ear pain, tinnitus.  RESPIRATORY: Negative for cough, wheezing, dyspnea, hemoptysis, painful respiration.  CARDIOVASCULAR: Negative for chest pain, palpitations, dizziness, syncopal episodes, orthopnea, pedal edema.  GASTROINTESTINAL: Negative for nausea, vomiting, diarrhea, constipation, abdominal pain, hematemesis, melena.  GENITOURINARY: Negative for dysuria, frequency, urgency.  ENDOCRINE: Negative for polyuria, nocturia, heat  or cold intolerance.  HEMATOLOGIC AND LYMPHATIC: Negative for anemia, easy bruising or bleeding, or swollen glands. INTEGUMENTARY: Negative for acne, skin rash, or lesions.  MUSCULOSKELETAL: Positive for chronic arthritis.  NEUROLOGICAL: Negative for focal weakness or numbness.  PSYCHIATRIC: Positive for depression.   PHYSICAL  EXAMINATION:  VITAL SIGNS: Temperature 98.3 degrees Fahrenheit, pulse rate 78 per minute, respirations 14 per minute, blood pressure 138/91, oxygen saturation 96% on room air.  GENERAL: Well developed, well nourished, drowsy but easily arousable, in no acute distress, comfortably resting in the bed.  HEAD: Atraumatic, normocephalic.  EYES: Pupils equal, reactive to light and accommodation. No conjunctival pallor. No icterus. Extraocular movements intact.  NOSE: No drainage. No lesions.  EARS: No drainage. No external lesions.  ORAL CAVITY: No mucosal lesions. No exudates.  NECK: Supple. No JVD. No thyromegaly. No carotid bruit. Range of motion of neck within normal limits.  RESPIRATORY: Good respiratory effort. Not using accessory muscles of respiration. Bilateral vesicular breath sounds present. No rales or rhonchi.  CARDIOVASCULAR: S1, S2 regular. No murmurs, gallops, or clicks. Pulses equal at carotid, femoral, and pedal pulses. No peripheral edema.  GASTROINTESTINAL: Abdomen soft, nontender. No hepatosplenomegaly. No masses. No rigidity. No guarding. Bowel sounds present and equal in all 4 quadrants.  GENITOURINARY: Deferred.  MUSCULOSKELETAL: No joint tenderness or effusion. Range of motion adequate. Strength and tone equal bilaterally.  SKIN: Inspection within normal limits.  LYMPHATIC: No cervical lymphadenopathy.  VASCULAR: Good dorsalis pedis and posterior tibial pulses.  NEUROLOGICAL: Alert, awake, and oriented x 3, drowsy but arousable. Cranial nerves II-XII grossly intact. No sensory deficit. Motor strength is 5/5 in both upper and lower extremities. DTRs 2+ bilateral and symmetrical. No asterixis.  PSYCHIATRIC: Alert, awake, and oriented x 3. Judgment and insight adequate. Memory and mood within normal limits.   ANCILLARY DATA:  LABORATORY DATA: Serum glucose 156, BUN 24, creatinine 1.06, sodium 136, potassium 3.6, chloride 104, bicarbonate 23, total calcium 8.8, plasma ammonia 47.  WBC 9.2, hemoglobin 13.4, hematocrit 39.0, platelet count 185,000. Urinalysis is unremarkable.   IMAGING STUDIES:  1.  Chest x-ray: Negative for acute cardiopulmonary process.  2.  CT of the head, noncontrast study: Negative for acute intracranial pathology.   ASSESSMENT AND PLAN: A 55 year old Caucasian male with a history of hepatitis C, hepatic encephalopathy, hypertension, borderline diabetes mellitus, depression, history of medical noncompliance, presents with increasing lethargy and confusion for the past 3-4 days, found to have elevated ammonia levels and CT of the head negative for any acute intracranial pathology.  1.  Lethargy/altered sensorium secondary to hepatic encephalopathy, history of medical noncompliance. Plan: Admit to medical floor, lactulose, continue Xifaxan, follow clinically and ammonia levels. 2.  Hepatitis C, known to known to gastroenterology, Dr. Candace Cruise, stable. Check LFTs and monitor and gastroenterology consultation for further advice.  3.  Hypertension, stable on home medications. Continue same.  4.  Diabetes mellitus, type 2, borderline. Not on any medications at home. Plan: Sliding scale insulin. Monitor blood sugars.  5.  Gastroesophageal reflux disease. Stable on proton pump inhibitor. Continue same.  6.  Deep vein thrombosis prophylaxis. Subcutaneous Lovenox.  7.  Gastrointestinal prophylaxis. Proton pump inhibitor.   CODE STATUS: Full code.   TIME SPENT: 50 minutes.     ____________________________ Juluis Mire, MD enr:bm D: 03/30/2015 01:44:22 ET T: 03/30/2015 02:32:17 ET JOB#: [id number]  cc: Juluis Mire, MD, <Dictator> Kirstie Peri. [name], MD Juluis Mire MD ELECTRONICALLY SIGNED 03/30/2015 18:07

## 2015-04-02 NOTE — Consult Note (Signed)
Pt seen and examined. Full consult to follow. Pt known to me with hx of Hep C cirrhosis. Has had recurrent issues with hepatic encephalophathy. Genotype 3. Has had issues with dysphagia, requiring repeating esophageal dilations in the past. Last saw patient on 4/6 in office. Pt had problems with signif hypotension and dehydration related to use of muscle relaxant. Pt also c/o abdomimal cramping and diarrhea even when not taking lactulose. Last colon was 2007. Repeat colon was scheduled. Had a snake bite afterwards. Got sick. Admitted on 4/16 with encephalopathy. Saw Dr. Allen Norris then. Discharged but when he became confused again, went to [facility] ER and end up getting admitted for few days. During this time, patient placed on 2 different courses of Abx. Admitted again last night due to worsening confusion. Ammonia mildly elevated. Pt placed on lactulose. Was non compliant recently due to diarrhea. Pt more alert though still lethargic. For now, continue lactulose. Order stool studies, incl c.diff. Once patient more alert, refer patient to [facility] liver service though I will continue to see patient here in [place]. Will follow. Thanks.   Electronic Signatures: Verdie Shire (MD) (Signed on 28-Apr-16 13:05)  Authored   Last Updated: 28-Apr-16 13:08 by Verdie Shire (MD)

## 2015-04-02 NOTE — Consult Note (Signed)
Chief Complaint:  Subjective/Chief Complaint More alert today though patient apparently had lot of hallucination last night. Ammonia better.   VITAL SIGNS/ANCILLARY NOTES: **Vital Signs.:   29-Apr-16 12:00  Vital Signs Type Q 4hr  Temperature Temperature (F) 98.1  Celsius 36.7  Temperature Source oral  Pulse Pulse 68  Respirations Respirations 18  Systolic BP Systolic BP 117  Diastolic BP (mmHg) Diastolic BP (mmHg) 96  Mean BP 112  Pulse Ox % Pulse Ox % 95  Pulse Ox Activity Level  At rest  Oxygen Delivery Room Air/ 21 %   Brief Assessment:  GEN no acute distress   Cardiac Regular   Respiratory clear BS   Gastrointestinal Normal   Lab Results: Routine Chem:  29-Apr-16 05:26   Ammonia, Plasma  40 (9-35 NOTE: New Reference Range  02/07/15)   Assessment/Plan:  Assessment/Plan:  Assessment Liver cirrhosis. Hepatic encephalopathy.   Plan Hopefully, patient can be discharged by tomorrow. Continue xifaxan and lactulose. Wife to reschedule colonoscopy to late May instead of 5/2. Dr. Rayann Heman to check on patient tomorrow. thanks   Electronic Signatures: Verdie Shire (MD)  (Signed 29-Apr-16 12:51)  Authored: Chief Complaint, VITAL SIGNS/ANCILLARY NOTES, Brief Assessment, Lab Results, Assessment/Plan   Last Updated: 29-Apr-16 12:51 by Verdie Shire (MD)

## 2015-04-02 NOTE — Consult Note (Signed)
PATIENT NAME:  Ray Robles, Ray Robles MR#:  [id number] DATE OF BIRTH:  08-29-1961  DATE OF CONSULTATION:  03/19/2015  CONSULTING PHYSICIAN:  Lucilla Lame, MD  CONSULTING SERVICE: Gastroenterology.   REASON FOR CONSULTATION: Cirrhosis.   HISTORY OF PRESENT ILLNESS: This patient is a 54 year old gentleman with long-standing history of hepatitis C and cirrhosis. The patient denies ever being treated for his hepatitis C and has been following with Dr. Candace Cruise. The patient also denies that he has ever been sent off for liver transplant evaluation. The patient states that due to psychiatric problems, he was never candidate for interferon therapy, but has never been put on any of the interferon free based treatments over the last couple of years. He is not sure what genotype of hepatitis C he has, although the chart shows it to be genotype 3. The patient has had ascites and hepatic encephalopathy in the past. He is now admitted with altered mental status and a fever. The patient's wife is with him at bedside when I am talking to him and she states that he is back to his baseline and doing well, although she states he was a little confused yesterday. The patient is on lactulose and Xifaxan at home for his hepatic encephalopathy.   PAST MEDICAL HISTORY: Hepatitis C, history of hepatic encephalopathy, migraines, osteoarthritis, depression, GERD, type 2 diabetes, benign hypertension.   HOME MEDICATIONS: Xifaxan, trazodone, Zanaflex, spironolactone, oxycodone, Zofran, omeprazole, Nitrostat, Lopressor, Lomotil, lactulose, Lasix and Lexapro.   ALLERGIES: MORPHINE, IBUPROFEN AND TYLENOL.   SOCIAL HISTORY: Negative for alcohol, tobacco or drugs.   FAMILY HISTORY: Noncontributory.   REVIEW OF SYSTEMS: A 10 point review of systems negative except what was stated above.   PHYSICAL EXAMINATION: VITAL SIGNS: The patient is temperature 98.4, pulse 84, respirations 18, blood pressure 128/79, pulse oximetry 96%.  HEENT:  Normocephalic, atraumatic. Extraocular motor intact. Pupils equally round and reactive to light and accommodation. NECK: Without JVD, without lymphadenopathy.  LUNGS: Clear to auscultation bilaterally.  HEART: Regular rate and rhythm without murmurs, rubs or gallops.  ABDOMEN: Soft, nondistended, nontender, without hepatosplenomegaly.  EXTREMITIES: Without cyanosis, clubbing or edema.  SKIN: Without any rashes or lesions.  NEUROLOGICAL: Grossly intact cranial nerves II through XII.  PSYCHIATRIC: The patient is alert and oriented to person, place and time.   ANCILLARY SERVICES: The patient's LFTs show an increased AST of 67 with normal alkaline phosphatase of 120 and ALT of 56 this morning. They were all slightly elevated on admission. The patient's other labs showed him to have a platelet count of 86,000 and an INR of 1.1.   ASSESSMENT AND PLAN: This patient is a 54 year old gentleman who comes in with altered mental status and a history of cirrhosis with possible pneumonia and chest pain. The patient's main reason for coming in was shortness of breath and chest pain, which he states has resolved. The patient has no sign of any flapping tremor or any signs of hepatic encephalopathy at the present time. The patient is already on the lactulose and Xifaxan. I would continue treating this patient symptomatically. Now that the patient has decompensated cirrhosis, treating his hepatitis C may not be warranted because of changing his MELD score, but he should definitely be considered for liver transplant evaluation at [facility] or [place]. I have explained this to the patient and they will bring it up with Dr. Candace Cruise when they see him next. Nothing further for gastroenterology to do on this admission since the patient is back at his baseline.  Thank you very much for involving me in the care of this patient. If you have any questions, please do not hesitate to call.    ____________________________ Lucilla Lame,  MD dw:TT D: 03/19/2015 11:48:22 ET T: 03/19/2015 12:48:21 ET JOB#: [id number]  cc: Lucilla Lame, MD, <Dictator> Lucilla Lame MD ELECTRONICALLY SIGNED 03/19/2015 19:01

## 2015-04-02 NOTE — Consult Note (Signed)
PATIENT NAME:  Ray Robles, Ray Robles MR#:  [id number] DATE OF BIRTH:  [birth year]  DATE OF CONSULTATION:  03/30/2015  REFERRING PHYSICIAN:   CONSULTING PHYSICIAN:  Lupita Dawn. Candace Cruise, MD  REASON FOR REFERRAL: Hepatic encephalopathy.   HISTORY OF PRESENT ILLNESS: The patient is a 54 year old white male with a known history of hepatitis C and cirrhosis. His genotype is 3. He has had recurrent issues and admissions from hepatic encephalopathy. He also has had issues with dysphagia requiring repeated esophageal dilations in the past. I last saw the patient on April 6 in the office. At that time, he was having some issues with abdominal cramping and diarrhea, even when he is not taking the lactulose. His last colonoscopy was in 2007. Therefore, repeat colonoscopy was scheduled. He also had a bout of significant hypotension and dehydration requiring ER visit prior to my office visit. Apparently it was related to a muscle relaxant that his primary doctor placed him on. He has since been placed off that particular muscle relaxant.  According to the wife, the patient has had many medical issues since the office visit. He had a snakebite afterwards. He got sick and got confused. He got admitted on April 16 with hepatic encephalopathy. Dr. Allen Norris saw him then. After he got better he was discharged. Unfortunately, he got confused again, ended up going to the [facility] emergency room a few days later after discharge and ended up being admitted again for several more days. During this time, because of possible infection related to the snake bite, the patient was placed on 2 different courses of antibiotics. The last antibiotic was possibly Zithromax. Then over the last couple of days, he started getting more confused again and lethargic. He got admitted again. Ammonia was mildly elevated. The patient was placed on regular lactulose. During the whole episodes of getting sick, he was rather noncompliant with the lactulose because of the  diarrhea issues. The patient is more alert today, but still lethargic.   PAST MEDICAL HISTORY: Includes hepatitis C with genotype 3. Other history includes hepatic encephalopathy, diabetes, hypertension, reflux, arthritis. He has had hernia repair and esophageal dilations.   ALLERGIES: MORPHINE, IBUPROFEN AND TYLENOL.   SOCIAL HISTORY: He denies tobacco or alcohol use.   FAMILY HISTORY: Notable for heart disease and coronary artery disease.   MEDICATIONS AT HOME: Include Lasix once a day, hyoscyamine as needed, lactulose 3 times a day 30 mL but he was rather noncompliant with this, metoprolol 25 mg b.i.d., omeprazole 20 mg once a day, Zofran as needed, oxycodone every 4 hours as needed for pain, Aldactone 50 mg once a day and Xifaxan 550 b.i.d.   REVIEW OF SYSTEMS: Please refer to the initial H and P. There are really no changes.  PHYSICAL EXAMINATION: VITAL SIGNS: The patient is afebrile. His vital signs are stable.  GENERAL: He is more alert, according to the wife, than last night, but he still seems somewhat lethargic. He has gained some weight which I suspect may be due to fluid buildup.   HEAD AND NECK: Within normal limits.  CARDIAC: Regular rhythm and rate.  LUNGS: Clear bilaterally.  ABDOMEN: Normoactive bowel sounds, soft. It is not distended.  EXTREMITIES: No clubbing, cyanosis, or edema. There is a bruise where the snake bit was, but it is much improved compared to the pictures that were taken initially.   DIAGNOSTIC DATA: Ammonia level is 47. Even when he is encephalopathic, ammonia level could be normal for this patient. Sodium 136, potassium 3.6,  chloride 104, hemoglobin 13.4, hematocrit 39, white count 9.2.   Chest x-ray was negative.   CT of the head was negative.  ASSESSMENT AND RECOMMENDATIONS: This is a patient with recurrent encephalopathy. Some of it is due to noncompliance with lactulose. The snake bit and subsequent infection as well as use of antibiotics may have  contributed to the recent bouts of encephalopathy. I agree with continuing Xifaxan and lactulose and some fluid. The patient is genotype 3 which means he was not considered suitable for Harvoni yet. I may have him go to [facility] liver service for possible evaluation of hepatitis C as well as liver transplant in the future. I will continue to see the patient while he is here and after discharge.   Thank you for the referral.   ____________________________ Lupita Dawn. Candace Cruise, MD pyo:sb D: 03/30/2015 13:54:31 ET T: 03/30/2015 14:15:22 ET JOB#: [id number]  cc: Lupita Dawn. Candace Cruise, MD, <Dictator> Lupita Dawn Elenore Wanninger MD ELECTRONICALLY SIGNED 03/31/2015 8:30

## 2015-04-02 NOTE — Anesthesia Preprocedure Evaluation (Deleted)
Anesthesia Evaluation    Reviewed: Allergy & Precautions, H&P , Patient's Chart, lab work & pertinent test results  Airway        Dental   Pulmonary          Cardiovascular     Neuro/Psych    GI/Hepatic Bowel prep,  Endo/Other    Renal/GU      Musculoskeletal   Abdominal   Peds  Hematology   Anesthesia Other Findings   Reproductive/Obstetrics                             Anesthesia Physical Anesthesia Plan  ASA:   Anesthesia Plan: General   Post-op Pain Management:    Induction:   Airway Management Planned:   Additional Equipment:   Intra-op Plan:   Post-operative Plan:   Informed Consent: I have reviewed the patients History and Physical, chart, labs and discussed the procedure including the risks, benefits and alternatives for the proposed anesthesia with the patient or authorized representative who has indicated his/her understanding and acceptance.     Plan Discussed with: CRNA and Surgeon  Anesthesia Plan Comments:         Anesthesia Quick Evaluation

## 2015-04-02 NOTE — H&P (Signed)
PATIENT NAME:  Ray Robles, BROUILLET MR#:  [id number] DATE OF BIRTH:  12-Oct-1961  DATE OF ADMISSION:  03/18/2015  REFERRING PHYSICIAN: Elta Guadeloupe R. Jacqualine Code, MD  FAMILY PHYSICIAN: Kirstie Peri. [name], MD   REASON FOR ADMISSION: Altered mental status with a fever.   HISTORY OF PRESENT ILLNESS: The patient is a 54 year old male with a history of hepatitis C and chronic cirrhosis. Has a history of hepatic encephalopathy in the past. Presents to the Emergency Room with a 3 to 4 day history of fevers, chills, shortness of breath, cough and lethargy. In the Emergency Room, the patient had a low-grade fever. Chest x-ray read by the radiologist showed no obvious pneumonia, although his examination was suggestive of respiratory infection. His serum ammonia was mildly elevated. He is now admitted for further evaluation.   PAST MEDICAL HISTORY:  1. Hepatitis C.  2. History of hepatic encephalopathy.  3. Migraine headaches.  4. Osteoarthritis.  5. Depression.  6. GE reflux disease.  7. Type 2 diabetes.  8. Benign hypertension.   MEDICATIONS:  1. Xifaxan 550 mg p.o. daily.  2. Trazodone 50 mg p.o. at bedtime.  3. Zanaflex 4 mg p.o. every 6 hours p.r.n.  4. Spironolactone 50 mg p.o. daily.  5. Oxycodone 15 mg p.o. every 4 hours p.r.n. pain.  6. Zofran 4 mg p.o. b.i.d. as needed.  7. Omeprazole 20 mg p.o. b.i.d.  8. Nitrostat 0.4 mg sublingually p.r.n. chest pain.  9. Lopressor 25 mg p.o. b.i.d.  10. Lomotil 2 tablets every 6 hours as needed for diarrhea.  11. Lactulose 30 MLS p.o. t.i.d. as needed.  12. Lasix 20 mg p.o. daily.  13. Lexapro 10 mg p.o. daily.   ALLERGIES: MORPHINE, IBUPROFEN AND TYLENOL.   SOCIAL HISTORY: Negative for alcohol or tobacco abuse.   FAMILY HISTORY: Positive for hypertension and coronary artery disease, but otherwise unremarkable.   REVIEW OF SYSTEMS:  CONSTITUTIONAL: The patient has had fever, but no change in weight.  EYES: No blurred or double vision. No glaucoma.  EARS,  NOSE AND THROAT: No tinnitus or hearing loss. No nasal discharge or bleeding. No difficulty swallowing.  RESPIRATORY: The patient has had cough, but no wheezing or hemoptysis. No painful respiration.  CARDIOVASCULAR: No chest pain or orthopnea. No palpitations or syncope.  GASTROINTESTINAL: The patient had diarrhea, but no nausea or vomiting. No abdominal pain.  GENITOURINARY: No dysuria or hematuria. No incontinence.  ENDOCRINE: No polyuria or polydipsia. No heat or cold intolerance.  HEMATOLOGICAL: The patient denies anemia, easy bruising, or bleeding.  LYMPHATIC: No swollen glands.  MUSCULOSKELETAL: The patient does have some pain in his back, but denies pain in his neck, shoulders, knees, or hips. No gout.  NEUROLOGIC: No numbness. No recent migraines. Denies stroke or seizures.  PSYCHOLOGICAL: The patient denies anxiety, insomnia or depression.   PHYSICAL EXAMINATION:  GENERAL: The patient is chronically ill-appearing, but in no acute distress.  VITAL SIGNS: Currently remarkable for a blood pressure of 155/82, heart rate of 95, respiratory rate of 22, temperature of 99.1, saturation 93% on room air.  HEENT: Normocephalic, atraumatic. Pupils equal, round and reactive to light and accommodation. Extraocular movements are intact. Sclerae are anicteric. Conjunctivae are clear.   OROPHARYNX: Clear.  NECK: Supple without JVD. No adenopathy or thyromegaly is noted.  LUNGS: Reveal basilar rhonchi without wheezes or rales. No dullness. Respiratory effort is normal.  CARDIAC: Regular rate and rhythm. Normal S1, S2. No significant rubs or gallops. PMI is nondisplaced. Chest wall is nontender.  ABDOMEN: Soft, mildly distended but nontender. Normoactive bowel sounds. No organomegaly or masses were appreciated. No hernias or bruits were noted.  EXTREMITIES: Without clubbing, cyanosis, or edema. Pulses were 2+ bilaterally.  SKIN: Warm and dry without rash or lesions.  NEUROLOGIC: Cranial nerves II  through XII grossly intact. Deep tendon reflexes were symmetric. Motor and sensory exam is nonfocal.  PSYCHIATRIC: Revealed a patient who was alert and oriented to person, place, and time. He was cooperative and used good judgment.   LABORATORY DATA: EKG revealed sinus rhythm with no acute ischemic changes. Chest x-ray was read as no evidence of pneumonia or pulmonary edema. His white count was 7.5 with a hemoglobin of 13.1. Magnesium was 1.6 with an ammonia level of 48. Glucose 152 with a BUN of 9, creatinine of 0.73 with a sodium of 132 and a GFR of greater than 60. His troponin was less than 0.03.   ASSESSMENT:  1. Febrile illness with possible early pneumonia.  2. Altered mental status.  3. Hepatic encephalopathy.  4. Cirrhosis.  5. Hypomagnesemia.  6. Hyponatremia.  7. Hyperglycemia.  8. Benign hypertension.   PLAN: The patient will be admitted to the floor as a full code with IV fluids and empiric IV antibiotics. We will resume lactulose and double his Xifaxan. Will consult gastroenterology. We will supplement his magnesium. Repeat labs in the morning. No indication for abdominal ultrasound at this time. He we will obtain a head CT given the patient's mental status changes. Further treatment and evaluation will depend upon the patient's progress.   TOTAL TIME SPENT ON THIS PATIENT: 50 minutes.    ____________________________ Leonie Douglas Doy Hutching, MD jds:AT D: 03/18/2015 19:19:32 ET T: 03/18/2015 19:54:33 ET JOB#: [id number]  cc: Leonie Douglas. Doy Hutching, MD, <Dictator> Kirstie Peri. [name], MD Idelle Crouch MD ELECTRONICALLY SIGNED 03/19/2015 13:21

## 2015-04-02 NOTE — Consult Note (Signed)
Brief Consult Note: Diagnosis: Long standing liver disease with HCV and cirrhosis. Patient had chest apin yesterday and his wife says he was confused.   Patient was seen by consultant.   Consult note dictated.   Comments: Consulted for cirrhosis. Patient know to have cirrhosis. Confusion resolved.  Patient should be set up as an out patient for OLT at [facility] or [place]. Nothing to add this admission.  Electronic Signatures: Lucilla Lame (MD)  (Signed 17-Apr-16 09:53)  Authored: Brief Consult Note   Last Updated: 17-Apr-16 09:53 by Lucilla Lame (MD)

## 2015-04-02 NOTE — Discharge Summary (Signed)
PATIENT NAME:  Ray Robles, Ray Robles MR#:  [id number] DATE OF BIRTH:  [birth year]  DATE OF ADMISSION:  03/18/2015 DATE OF DISCHARGE:  03/19/2015  ADMITTING DIAGNOSIS:  Encephalopathy.   DISCHARGE DIAGNOSES:  1. Acute bronchitis.  2. Liver cirrhosis due to hepatitis C.  3. Hepatic and metabolic encephalopathy due to above.  4. History of migraine headaches.  5. Osteoarthritis.  6. Chronic pain syndrome.  7. Depression.  8. Gastroesophageal reflux disease.  9. Diabetes mellitus, type 2.  10.  History of hypertension.   DISCHARGE CONDITION: Stable.   DISCHARGE MEDICATIONS:  The patient is to continue his outpatient medications which are:  1. Furosemide 20 mg p.o. daily.  2. Escitalopram 10 mg p.o. daily.  3. Trazodone 50 mg 1 tablet at bedtime daily. 4. Melatonin 5 mg p.o. at bedtime.  5. Nystatin topical cream topically 3 times daily.  6. Tizanidine 4 mg every 6 hours as needed.  7. Nitrostat 0.4 mg sublingually every 5 minutes as needed.  8. Hyoscyamine 125 mcg every 4 hours as needed. 9. Spironolactone 50 mg p.o. daily.  10.  Lomotil 2.5 mg/0.025 mg 2 tablets every 6 hours as needed.  11.  Metoprolol tartrate 25 mg twice daily.  12.  Omeprazole 10 mg 1-2 tablets daily.  13.  Oxycodone 15 mg every 4 hours as needed.  14.  Zofran 4 mg twice daily as needed.  15.  Lactulose 10 mg in 15 mL oral syrup, 30 mL 4 times daily.  16.  Rifaximin 550 mg twice daily.  This is a new dose from 1 time daily.  17.  Combivent  Respimat 1 inhalation 4 times daily as needed.  18.  Zithromax 500 mg once daily for 4 more days.   DIET:  Two grams salt, low fat, low cholesterol, regular consistency.   ACTIVITY LIMITATIONS:  As tolerated.    FOLLOWUP APPOINTMENT:  With Dr. [name] in 2 days after discharge.   CONSULTANTS:  Care management, social work, Dr. Allen Norris.   RADIOLOGIC STUDIES:  Chest x-ray PA and lateral 03/18/2015 revealed no acute cardiopulmonary disease.  Repeat chest x-ray PA and lateral  03/19/2015 revealed persistent bronchitic changes and bibasilar atelectasis.  CT of head without contrast 03/18/2015 revealed no acute intracranial process.    HOSPITAL COURSE:  The patient is a 54 year old Caucasian male with history of hepatitis C and liver cirrhosis who presented to the hospital with complaints of altered mental status as well as fevers.  Please refer to Dr. Doy Hutching admission note on 03/18/2015.  On arrival to the hospital, the patient was noted to have elevated ammonia level and apparently was having cough and lethargy and some shortness of breath.   His vital signs in the Emergency Room:  Temperature was 99.1, pulse was 95, respirations were 22, blood pressure 165/82, saturation was 93% on room air.  Physical exam revealed rhonchi without wheezes or rales, diminished breath sounds, no dullness to percussion.  The patient had normal mental status examination, however.  On arrival to the hospital, the patient's laboratories showed glucose level elevation to 152, sodium 132, otherwise BMP was unremarkable.  Phosphorus level was low at 2.2 and magnesium level was low at 1.6.  Ammonia level was elevated at 48.  Liver enzymes showed alkaline phosphatase 175, AST was 84, and ALT was 71.  Cardiac enzymes:  Troponin was less than 0.03.  White blood cell count was normal at 7.5, hemoglobin was 13.1, and platelet count was 109,000.  Coagulation panel was unremarkable.  The patient's urinalysis was unremarkable.  The patient was admitted to the hospital for further evaluation.  He was felt to have bronchitis.  He was initiated on Rocephin and Zithromax.  With this, his condition improved, his mental status also improved, and he was ready to go home the next day.  He was recommended to continue Zithromax for 4 more days to complete the course and follow up with his primary care physician for further recommendations.  In regard to altered mental status, it was felt to be hepatic as well as metabolic  encephalopathy due to acute bronchitis.  The patient was advised to continue lactulose but not as needed but as scheduled doses and his rifaximin was advanced to 550 mg twice daily dose from once daily dose.  In regard to shortness of breath, it was felt to be due to fevers as well as acute bronchitis.  The patient was initiated on Combivent Respimat.  For his chronic medical problems, patient is to continue his outpatient medications; no changes were made.  The patient is being discharged in stable condition with the above-mentioned medications and followup.  On the day of discharge, temperature was 98.3, pulse was 80, respirations were 17, blood pressure 129/79, and saturation was 96% on room air at rest.  His oxygenation was also checked on exertion and he was felt to be doing really well.  Of note, his sodium level improved with IV fluid administration and on the day of discharge 03/19/2015, was normal at 137.  His magnesium level was supplemented and was normal at 2.1.  His liver enzymes revealed AST elevation to 67; however, the patient's ALT normalized to 56 on the day of discharge and alkaline phosphatase was also normal at 120.  The patient is being discharged in stable condition as above, and he is to follow up with his primary care physician for recommendations.   TIME SPENT:  Forty minutes.    ____________________________ Theodoro Grist, MD rv:kc D: 03/19/2015 14:31:34 ET T: 03/19/2015 15:45:28 ET JOB#: [id number]  cc: Theodoro Grist, MD, <Dictator> Unknown cc  Shantinique Picazo MD ELECTRONICALLY SIGNED 03/21/2015 19:34

## 2015-04-03 ENCOUNTER — Encounter: Admission: RE | Payer: Self-pay | Source: Ambulatory Visit

## 2015-04-03 ENCOUNTER — Ambulatory Visit: Admission: RE | Admit: 2015-04-03 | Payer: Medicare Other | Source: Ambulatory Visit | Admitting: Gastroenterology

## 2015-04-03 SURGERY — COLONOSCOPY
Anesthesia: General

## 2015-04-04 DIAGNOSIS — G4700 Insomnia, unspecified: Secondary | ICD-10-CM | POA: Diagnosis not present

## 2015-04-04 DIAGNOSIS — B1920 Unspecified viral hepatitis C without hepatic coma: Secondary | ICD-10-CM | POA: Diagnosis not present

## 2015-04-04 DIAGNOSIS — M5126 Other intervertebral disc displacement, lumbar region: Secondary | ICD-10-CM | POA: Diagnosis not present

## 2015-04-04 DIAGNOSIS — N529 Male erectile dysfunction, unspecified: Secondary | ICD-10-CM | POA: Diagnosis not present

## 2015-04-10 NOTE — Discharge Summary (Signed)
Dates of Admission and Diagnosis:  Date of Admission 30-Mar-2015   Date of Discharge 01-Apr-2015   Final Diagnosis hepatic encephalopathy livercitthosis due to Hepc chronic  low back pain   Discharge Diagnosis 1 hepc   2 DMII    Chief Complaint/History of Present Illness 54 yr old male pt with HEpc,cirrhosis,admitted for  lethargy,found to have hepatic encephalopathy,amonia 47.pt is non compliant with lactulose and xifaxan at home. he is admitted to [facility],started back on lactulose,xifaxan.his work up for sepsis was ngeative.seen by GI Dr.Oh,who recommended continuing lactulose,xifaxan,,.symptoms improved.his CT head was unremarkable.his septic  work up is negative.   Allergies:  Morphine: N/V/Diarrhea  Ibuprofen: Other  Tylenol: Other  Zolpidem: Other  Routine Chem:  27-Apr-16 15:34   Ammonia, Plasma  47 (9-35 NOTE: New Reference Range  02/07/15)  Glucose, Serum  156 (65-99 NOTE: New Reference Range  02/07/15)  BUN  24 (6-20 NOTE: New Reference Range  02/07/15)  Creatinine (comp) 1.06 (0.61-1.24 NOTE: New Reference Range  02/07/15)  Sodium, Serum 136 (135-145 NOTE: New Reference Range  02/07/15)  Chloride, Serum 104 (101-111 NOTE: New Reference Range  02/07/15)  CO2, Serum 23 (22-32 NOTE: New Reference Range  02/07/15)  Calcium (Total), Serum  8.8 (8.9-10.3 NOTE: New Reference Range  02/07/15)  Anion Gap 9  eGFR (African American) >60  eGFR (Non-African American) >60 (eGFR values <56mL/min/1.73 m2 may be an indication of chronic kidney disease (CKD). Calculated eGFR is useful in patients with stable renal function. The eGFR calculation will not be reliable in acutely ill patients when serum creatinine is changing rapidly. It is not useful in patients on dialysis. The eGFR calculation may not be applicable to patients at the low and high extremes of body sizes, pregnant women, and vegetarians.)  30-Apr-16 04:51   Ammonia, Plasma 27 (9-35 NOTE: New  Reference Range  02/07/15)  Routine UA:  27-Apr-16 15:35   Color (UA) Amber  Clarity (UA) Clear  Glucose (UA) Negative  Bilirubin (UA) Negative  Ketones (UA) Negative  Specific Gravity (UA) 1.027  Blood (UA) Negative  pH (UA) 5.0  Protein (UA) 30 mg/dL  Nitrite (UA) Negative  Leukocyte Esterase (UA) Negative (Result(s) reported on 29 Mar 2015 at 06:46PM.)  RBC (UA) 0-5  WBC (UA) 0-5  Bacteria (UA) NONE SEEN  Epithelial Cells (UA) NONE SEEN  Mucous (UA) PRESENT (Result(s) reported on 29 Mar 2015 at 06:46PM.)  Routine Hem:  27-Apr-16 15:34   WBC (CBC) 9.2  RBC (CBC)  4.29  Hemoglobin (CBC) 13.4  Hematocrit (CBC)  39.0  Platelet Count (CBC) 185 (Result(s) reported on 29 Mar 2015 at 04:05PM.)  MCV 91  MCH 31.2  MCHC 34.3  RDW 13.7   PERTINENT RADIOLOGY STUDIES: XRay:    16-Apr-16 17:14, Chest PA and Lateral  Chest PA and Lateral   REASON FOR EXAM:    shortness of breath, chest pain  COMMENTS:       PROCEDURE: DXR - DXR CHEST PA (OR AP) AND LATERAL  - Mar 18 2015  5:14PM     CLINICAL DATA:  Patient with shortness of breath and chest pain.    EXAM:  CHEST  2 VIEW    COMPARISON:Chest radiograph 02/05/2015    FINDINGS:  Stable cardiac mediastinal contours. No consolidative pulmonary  opacities. No pleural effusion or pneumothorax. Regional skeleton  demonstrates mid thoracic spine degenerative changes.     IMPRESSION:  No acute cardiopulmonary process.      Electronically Signed    By: Dian Situ  Earlene Plater M.D.    On: 03/18/2015 17:21         Verified By: Antonieta Loveless, M.D.,    17-Apr-16 07:47, Chest PA and Lateral  Chest PA and Lateral   REASON FOR EXAM:    pneumonia  COMMENTS:       PROCEDURE: DXR - DXR CHEST PA (OR AP) AND LATERAL  - Mar 19 2015  7:47AM     CLINICAL DATA:  Pneumonia.  Shortness of breath.    EXAM:  CHEST  2 VIEW    COMPARISON:  03/18/2015    FINDINGS:  The cardiac silhouette, mediastinal and hilar contours are within  normal limits  and stable. There are persistent bronchitic changes  and bibasilar atelectasis but no definite infiltrates or effusions.  The bony thorax is intact.     IMPRESSION:  Persistent bronchitic changes and bibasilar atelectasis.      Electronically Signed    By: Rudie Meyer M.D.    On: 03/19/2015 07:57         Verified By: Cristi Loron, M.D.,  [place]:    06-Mar-16 18:35, US Abdomen Limited Survey  US Abdomen Limited Survey   REASON FOR EXAM:    RUQ pain, increased lft's  COMMENTS:   May transport without cardiac monitor    PROCEDURE: [place]  - US ABDOMEN LIMITED SURVEY  - Feb 05 2015  6:35PM     CLINICAL DATA:  Initial evaluation right upper quadrant pain,  increased liver function tests.    EXAM:  US ABDOMEN LIMITED - RIGHT UPPER QUADRANT    COMPARISON:  01/30/2014 ultrasound    FINDINGS:  Gallbladder:  No stones.  Wall thickness is 4.5 mm.  No Murphy's sign.    Common bile duct:    Diameter: 5.7 mm    Liver:    Mildly lobulated border in the right lobe of. No focal  abnormalities.     IMPRESSION:  1. Mildly macro lobulated border of the liver on the right possibly  normal variant. Cirrhosis not excluded. This is noted to be very  similar to the prior study.  2. Gallbladder wall thickening nonspecific finding. This can be due  to hepatic parenchymal disease and does not necessarily signifying  cholecystitis, with no Murphy sign or stones identified.  Furthermore, the appearance is not significantly different from the  prior examination.      Electronically Signed    By: Esperanza Heir M.D.    On: 02/05/2015 18:41         Verified By: Otilio Carpen, M.D.,  LabUnknown:  PACS Image    Hospital Course:  Hospital Course 54 yr old male pt with HEpc,cirrhosis,admitted for  lethargy,found to have hepatic encephalopathy,amonia 47.pt is non compliant with lactulose and xifaxan at home. he is admitted to [facility],started back on lactulose,xifaxan.his work up  for sepsis was ngeative.seen by GI Dr.Oh,who recommended continuing lactulose,xifaxan,,.symptoms improved.his CT head was unremarkable.his septic  work up is negative. discharged home in stable condition.   Condition on Discharge Fair   Code Status:  Code Status Full Code   DISCHARGE INSTRUCTIONS HOME MEDS:  Medication Reconciliation: Patient's Home Medications at Discharge:     Medication Instructions  nystatin topical 100000 units/g topical cream  Apply topically to affected area 3 times a day, As Needed for rash.   nitrostat 0.4 mg sublingual tablet  1 tab(s) sublingual every 5 minutes, As Needed - for Chest Pain   hyoscyamine 0.125 mg oral tablet  1 tab(s) orally every 4 hours, As Needed for abdominal pain.    spironolactone 50 mg oral tablet  1 tab(s) orally once a day, As Needed for edema.    metoprolol tartrate 25 mg oral tablet  1 tab(s) orally 2 times a day   furosemide 20 mg oral tablet  1 tab(s) orally once a day (in the morning)   omeprazole 20 mg oral delayed release capsule  2 cap(s) orally once a day (in the morning)   ondansetron 4 mg oral tablet  1 tab(s) orally every 8 hours, As Needed - for Nausea, Vomiting   lactulose 10 g/15 ml oral syrup  30 milliliter(s) orally 3 times a day   azithromycin 500 mg oral tablet  1 tab(s) orally once a day   xifaxan 550 mg oral tablet  1 tab(s) orally 2 times a day   oxycodone 15 mg oral tablet  2 tab(s) orally every 4 hours, As Needed - for Pain   combivent respimat cfc free 100 mcg-20 mcg/inh inhalation aerosol  1 puff(s) inhaled 4 times a day, As Needed - for Shortness of Breath     Physician's Instructions:  Home Health? No   Home Oxygen? No   Diet Low Sodium   Diet Consistency Regular Consistency   Activity Limitations As tolerated   Referrals None   Time frame for Follow Up Appointment 1-2 weeks   Other Comments physcical exam;at the d/c time;cvcs1,s2 regular lungs;clear to auscultatiion.abdomen;soft,NT,ND.BS  present   TIME SPENT:  Total Time: Greater than 30 minutes   Electronic Signatures: Epifanio Lesches (MD)  (Signed 07-May-16 18:17)  Authored: ADMISSION DATE AND DIAGNOSIS, CHIEF COMPLAINT/HPI, Allergies, PERTINENT LABS, [place], DISCHARGE INSTRUCTIONS HOME MEDS, PATIENT INSTRUCTIONS, TIME SPENT   Last Updated: 07-May-16 18:17 by Epifanio Lesches (MD)

## 2015-04-24 DIAGNOSIS — K7210 Chronic hepatic failure without coma: Secondary | ICD-10-CM | POA: Diagnosis not present

## 2015-04-24 DIAGNOSIS — L309 Dermatitis, unspecified: Secondary | ICD-10-CM | POA: Diagnosis not present

## 2015-04-24 DIAGNOSIS — G8929 Other chronic pain: Secondary | ICD-10-CM | POA: Diagnosis not present

## 2015-04-24 DIAGNOSIS — M5020 Other cervical disc displacement, unspecified cervical region: Secondary | ICD-10-CM | POA: Diagnosis not present

## 2015-04-27 DIAGNOSIS — K7290 Hepatic failure, unspecified without coma: Secondary | ICD-10-CM | POA: Diagnosis not present

## 2015-04-27 DIAGNOSIS — K529 Noninfective gastroenteritis and colitis, unspecified: Secondary | ICD-10-CM | POA: Diagnosis not present

## 2015-05-09 ENCOUNTER — Encounter: Payer: Self-pay | Admitting: *Deleted

## 2015-05-12 ENCOUNTER — Encounter
Admission: RE | Disposition: A | Discharge status: Home / Self Care | Payer: Self-pay | Source: Ambulatory Visit | Attending: Gastroenterology

## 2015-05-12 ENCOUNTER — Ambulatory Visit (INDEPENDENT_AMBULATORY_CARE_PROVIDER_SITE_OTHER): Payer: Medicare Other | Admitting: Family Medicine

## 2015-05-12 ENCOUNTER — Encounter: Payer: Self-pay | Admitting: *Deleted

## 2015-05-12 ENCOUNTER — Ambulatory Visit
Admission: RE | Admit: 2015-05-12 | Discharge: 2015-05-12 | Disposition: A | Discharge status: Home / Self Care | Payer: Medicare Other | Source: Ambulatory Visit | Attending: Family Medicine | Admitting: Family Medicine

## 2015-05-12 ENCOUNTER — Ambulatory Visit: Payer: Self-pay | Admitting: Family Medicine

## 2015-05-12 ENCOUNTER — Encounter: Payer: Self-pay | Admitting: Family Medicine

## 2015-05-12 ENCOUNTER — Ambulatory Visit
Admission: RE | Admit: 2015-05-12 | Discharge: 2015-05-12 | Disposition: A | Discharge status: Home / Self Care | Payer: Medicare Other | Source: Ambulatory Visit | Attending: Gastroenterology | Admitting: Gastroenterology

## 2015-05-12 ENCOUNTER — Ambulatory Visit: Payer: Medicare Other | Admitting: Anesthesiology

## 2015-05-12 VITALS — BP 140/80 | HR 112 | Temp 98.0°F | Resp 16 | Ht 70.5 in | Wt 207.0 lb

## 2015-05-12 DIAGNOSIS — E876 Hypokalemia: Secondary | ICD-10-CM | POA: Insufficient documentation

## 2015-05-12 DIAGNOSIS — Z1389 Encounter for screening for other disorder: Secondary | ICD-10-CM | POA: Diagnosis not present

## 2015-05-12 DIAGNOSIS — M2550 Pain in unspecified joint: Secondary | ICD-10-CM | POA: Insufficient documentation

## 2015-05-12 DIAGNOSIS — I10 Essential (primary) hypertension: Secondary | ICD-10-CM | POA: Diagnosis not present

## 2015-05-12 DIAGNOSIS — K7290 Hepatic failure, unspecified without coma: Secondary | ICD-10-CM | POA: Insufficient documentation

## 2015-05-12 DIAGNOSIS — R202 Paresthesia of skin: Principal | ICD-10-CM

## 2015-05-12 DIAGNOSIS — R825 Elevated urine levels of drugs, medicaments and biological substances: Secondary | ICD-10-CM | POA: Insufficient documentation

## 2015-05-12 DIAGNOSIS — G8929 Other chronic pain: Secondary | ICD-10-CM | POA: Insufficient documentation

## 2015-05-12 DIAGNOSIS — L0390 Cellulitis, unspecified: Secondary | ICD-10-CM | POA: Insufficient documentation

## 2015-05-12 DIAGNOSIS — R519 Headache, unspecified: Secondary | ICD-10-CM | POA: Insufficient documentation

## 2015-05-12 DIAGNOSIS — K529 Noninfective gastroenteritis and colitis, unspecified: Secondary | ICD-10-CM | POA: Insufficient documentation

## 2015-05-12 DIAGNOSIS — Z79899 Other long term (current) drug therapy: Secondary | ICD-10-CM | POA: Diagnosis not present

## 2015-05-12 DIAGNOSIS — F329 Major depressive disorder, single episode, unspecified: Secondary | ICD-10-CM | POA: Diagnosis not present

## 2015-05-12 DIAGNOSIS — R000 Tachycardia, unspecified: Secondary | ICD-10-CM | POA: Diagnosis not present

## 2015-05-12 DIAGNOSIS — K759 Inflammatory liver disease, unspecified: Secondary | ICD-10-CM | POA: Diagnosis not present

## 2015-05-12 DIAGNOSIS — E559 Vitamin D deficiency, unspecified: Secondary | ICD-10-CM

## 2015-05-12 DIAGNOSIS — R1013 Epigastric pain: Secondary | ICD-10-CM | POA: Insufficient documentation

## 2015-05-12 DIAGNOSIS — K7682 Hepatic encephalopathy: Secondary | ICD-10-CM

## 2015-05-12 DIAGNOSIS — N289 Disorder of kidney and ureter, unspecified: Secondary | ICD-10-CM | POA: Insufficient documentation

## 2015-05-12 DIAGNOSIS — R1030 Lower abdominal pain, unspecified: Secondary | ICD-10-CM | POA: Diagnosis not present

## 2015-05-12 DIAGNOSIS — G4730 Sleep apnea, unspecified: Secondary | ICD-10-CM | POA: Diagnosis not present

## 2015-05-12 DIAGNOSIS — M1710 Unilateral primary osteoarthritis, unspecified knee: Secondary | ICD-10-CM | POA: Insufficient documentation

## 2015-05-12 DIAGNOSIS — K4090 Unilateral inguinal hernia, without obstruction or gangrene, not specified as recurrent: Secondary | ICD-10-CM | POA: Insufficient documentation

## 2015-05-12 DIAGNOSIS — E538 Deficiency of other specified B group vitamins: Secondary | ICD-10-CM

## 2015-05-12 DIAGNOSIS — M519 Unspecified thoracic, thoracolumbar and lumbosacral intervertebral disc disorder: Secondary | ICD-10-CM | POA: Insufficient documentation

## 2015-05-12 DIAGNOSIS — F32A Depression, unspecified: Secondary | ICD-10-CM | POA: Insufficient documentation

## 2015-05-12 DIAGNOSIS — R197 Diarrhea, unspecified: Secondary | ICD-10-CM | POA: Diagnosis not present

## 2015-05-12 DIAGNOSIS — L821 Other seborrheic keratosis: Secondary | ICD-10-CM | POA: Insufficient documentation

## 2015-05-12 DIAGNOSIS — G9009 Other idiopathic peripheral autonomic neuropathy: Secondary | ICD-10-CM

## 2015-05-12 DIAGNOSIS — F419 Anxiety disorder, unspecified: Secondary | ICD-10-CM | POA: Diagnosis not present

## 2015-05-12 DIAGNOSIS — R51 Headache: Secondary | ICD-10-CM

## 2015-05-12 DIAGNOSIS — M5136 Other intervertebral disc degeneration, lumbar region: Secondary | ICD-10-CM | POA: Diagnosis not present

## 2015-05-12 DIAGNOSIS — B1920 Unspecified viral hepatitis C without hepatic coma: Secondary | ICD-10-CM | POA: Insufficient documentation

## 2015-05-12 DIAGNOSIS — M5020 Other cervical disc displacement, unspecified cervical region: Secondary | ICD-10-CM | POA: Diagnosis not present

## 2015-05-12 DIAGNOSIS — Z79891 Long term (current) use of opiate analgesic: Secondary | ICD-10-CM | POA: Diagnosis not present

## 2015-05-12 DIAGNOSIS — R200 Anesthesia of skin: Secondary | ICD-10-CM

## 2015-05-12 DIAGNOSIS — R1310 Dysphagia, unspecified: Secondary | ICD-10-CM | POA: Insufficient documentation

## 2015-05-12 DIAGNOSIS — R7989 Other specified abnormal findings of blood chemistry: Secondary | ICD-10-CM | POA: Insufficient documentation

## 2015-05-12 DIAGNOSIS — R945 Abnormal results of liver function studies: Secondary | ICD-10-CM | POA: Insufficient documentation

## 2015-05-12 DIAGNOSIS — N529 Male erectile dysfunction, unspecified: Secondary | ICD-10-CM | POA: Insufficient documentation

## 2015-05-12 HISTORY — PX: COLONOSCOPY WITH PROPOFOL: SHX5780

## 2015-05-12 HISTORY — DX: Sleep apnea, unspecified: G47.30

## 2015-05-12 HISTORY — DX: Essential (primary) hypertension: I10

## 2015-05-12 HISTORY — DX: Major depressive disorder, single episode, unspecified: F32.9

## 2015-05-12 HISTORY — DX: Depression, unspecified: F32.A

## 2015-05-12 HISTORY — DX: Hepatic encephalopathy: K76.82

## 2015-05-12 HISTORY — DX: Myoneural disorder, unspecified: G70.9

## 2015-05-12 HISTORY — DX: Unspecified osteoarthritis, unspecified site: M19.90

## 2015-05-12 HISTORY — DX: Anxiety disorder, unspecified: F41.9

## 2015-05-12 SURGERY — COLONOSCOPY WITH PROPOFOL
Anesthesia: General

## 2015-05-12 MED ORDER — LIDOCAINE HCL (CARDIAC) 20 MG/ML IV SOLN
INTRAVENOUS | Status: DC | PRN
  Administered 2015-05-12: 50 mg via INTRAVENOUS

## 2015-05-12 MED ORDER — PROPOFOL INFUSION 10 MG/ML OPTIME
INTRAVENOUS | Status: DC | PRN
  Administered 2015-05-12: 75 ug/kg/min via INTRAVENOUS

## 2015-05-12 MED ORDER — SODIUM CHLORIDE 0.9 % IV SOLN
INTRAVENOUS | Status: DC
  Administered 2015-05-12: 1000 mL via INTRAVENOUS
  Administered 2015-05-12: 08:00:00 via INTRAVENOUS

## 2015-05-12 MED ORDER — PROPOFOL 10 MG/ML IV BOLUS
INTRAVENOUS | Status: DC | PRN
  Administered 2015-05-12: 70 mg via INTRAVENOUS

## 2015-05-12 MED ORDER — OXYCODONE HCL 15 MG PO TABS: 30.0000 mg | ORAL_TABLET | ORAL | Status: DC | PRN

## 2015-05-12 MED ORDER — FENTANYL CITRATE (PF) 100 MCG/2ML IJ SOLN
INTRAMUSCULAR | Status: DC | PRN
  Administered 2015-05-12: 50 ug via INTRAVENOUS

## 2015-05-12 MED ORDER — PREDNISONE 10 MG PO TABS: ORAL_TABLET | ORAL | Status: AC

## 2015-05-12 MED ORDER — SODIUM CHLORIDE 0.9 % IV SOLN: INTRAVENOUS | Status: DC

## 2015-05-12 NOTE — H&P (Signed)
  Date of Initial H&P:04/27/2015  History reviewed, patient examined, no change in status, stable for surgery.

## 2015-05-12 NOTE — Transfer of Care (Signed)
Immediate Anesthesia Transfer of Care Note  Patient: Ray Robles  Procedure(s) Performed: Procedure(s): COLONOSCOPY WITH PROPOFOL (N/A)  Patient Location: PACU and Endoscopy Unit  Anesthesia Type:General  Level of Consciousness: awake, alert  and oriented  Airway & Oxygen Therapy: Patient Spontanous Breathing and Patient connected to nasal cannula oxygen  Post-op Assessment: Report given to RN and Post -op Vital signs reviewed and stable  Post vital signs: Reviewed and stable  Last Vitals:  Filed Vitals:   05/12/15 0822  BP:   Pulse: 68  Temp: 36.7 C  Resp: 12    Complications: No apparent anesthesia complications

## 2015-05-12 NOTE — Anesthesia Postprocedure Evaluation (Signed)
  Anesthesia Post-op Note  Patient: Ray Robles  Procedure(s) Performed: Procedure(s): COLONOSCOPY WITH PROPOFOL (N/A)  Anesthesia type:General  Patient location: PACU  Post pain: Pain level controlled  Post assessment: Post-op Vital signs reviewed, Patient's Cardiovascular Status Stable, Respiratory Function Stable, Patent Airway and No signs of Nausea or vomiting  Post vital signs: Reviewed and stable  Last Vitals:  Filed Vitals:   05/12/15 0850  BP: 155/92  Pulse: 66  Temp:   Resp: 12    Level of consciousness: awake, alert  and patient cooperative  Complications: No apparent anesthesia complications

## 2015-05-12 NOTE — Op Note (Signed)
[facility] Gastroenterology Patient Name: Ray Robles Procedure Date: 05/12/2015 8:04 AM MRN: [id number] Account #: [id number] Date of Birth: [birth year] Admit Type: Outpatient Age: 54 Room: [facility] ENDO ROOM 4 Gender: Male Note Status: Finalized Procedure:         Colonoscopy Indications:       Lower abdominal pain, cirrhosis Providers:         Lupita Dawn. Candace Cruise, MD Referring MD:      Kirstie Peri. [name], MD (Referring MD) Medicines:         Monitored Anesthesia Care Complications:     No immediate complications. Procedure:         Pre-Anesthesia Assessment:                    - Prior to the procedure, a History and Physical was                     performed, and patient medications, allergies and                     sensitivities were reviewed. The patient's tolerance of                     previous anesthesia was reviewed.                    - The risks and benefits of the procedure and the sedation                     options and risks were discussed with the patient. All                     questions were answered and informed consent was obtained.                    - After reviewing the risks and benefits, the patient was                     deemed in satisfactory condition to undergo the procedure.                    After obtaining informed consent, the colonoscope was                     passed under direct vision. Throughout the procedure, the                     patient's blood pressure, pulse, and oxygen saturations                     were monitored continuously. The Colonoscope was                     introduced through the anus and advanced to the the cecum,                     identified by appendiceal orifice and ileocecal valve. The                     colonoscopy was performed without difficulty. The patient                     tolerated the procedure well. The quality of the bowel  preparation was fair. Findings:      The colon  (entire examined portion) appeared normal. Impression:        - The entire examined colon is normal.                    - No specimens collected. Recommendation:    - Discharge patient to home.                    - Repeat colonoscopy in 10 years for surveillance.                    - The findings and recommendations were discussed with the                     patient. Procedure Code(s): --- Professional ---                    [phone], Colonoscopy, flexible; diagnostic, including                     collection of specimen(s) by brushing or washing, when                     performed (separate procedure) Diagnosis Code(s): --- Professional ---                    R10.30, Lower abdominal pain, unspecified CPT copyright 2014 American Medical Association. All rights reserved. The codes documented in this report are preliminary and upon coder review may  be revised to meet current compliance requirements. Hulen Luster, MD 05/12/2015 8:20:41 AM This report has been signed electronically. Number of Addenda: 0 Note Initiated On: 05/12/2015 8:04 AM Scope Withdrawal Time: 0 hours 5 minutes 41 seconds  Total Procedure Duration: 0 hours 8 minutes 26 seconds       [facility]

## 2015-05-12 NOTE — Progress Notes (Signed)
Patient: Ray Robles Male    DOB: 11/15/1961   54 y.o.   MRN: [id number] Visit Date: 05/12/2015  Today's Provider: Lelon Huh, MD   Chief Complaint  Patient presents with  . Extremity Weakness   Subjective:    Extremity Weakness  The pain is present in the left upper leg, left lower leg, right lower leg and right upper leg. This is a new problem. The current episode started 1 to 4 weeks ago. There has been no history of extremity trauma. The problem occurs constantly. The problem has been gradually worsening. The quality of the pain is described as burning (Feet burning). The pain is at a severity of 5/10. The pain is moderate. Associated symptoms include a limited range of motion, numbness and tingling. Pertinent negatives include no fever, joint locking, joint swelling or stiffness. Associated symptoms comments: Feet. The symptoms are aggravated by standing. He has tried nothing for the symptoms. The treatment provided no relief. Family history includes rheumatoid arthritis. Family history does not include gout. There is no history of diabetes.   Patient is having burning sensation in both feet x3 days.   Previous Medications   CYCLOBENZAPRINE (FLEXERIL) 5 MG TABLET    Take 1-2 tablets by mouth every 8 (eight) hours as needed. For headache   DIPHENOXYLATE-ATROPINE (LOMOTIL) 2.5-0.025 MG PER TABLET    Take 2 tablets by mouth every 6 (six) hours as needed. For diarrhea   ESCITALOPRAM (LEXAPRO) 10 MG TABLET    Take 10 mg by mouth daily.   FUROSEMIDE (LASIX) 20 MG TABLET    Take 20 mg by mouth daily.   HYOSCYAMINE (ANASPAZ) 0.125 MG TBDP DISINTERGRATING TABLET    Place 0.125 mg under the tongue every 4 (four) hours as needed for bladder spasms or cramping.   LACTULOSE (CHRONULAC) 10 GM/15ML SOLUTION    Take 30 mLs by mouth 3 (three) times daily.   METOPROLOL TARTRATE (LOPRESSOR) 25 MG TABLET    Take 25 mg by mouth 2 (two) times daily.   NITROGLYCERIN (NITROSTAT) 0.4 MG SL TABLET     Place 0.4 mg under the tongue every 5 (five) minutes as needed for chest pain.   NYSTATIN CREAM (MYCOSTATIN)    Apply 1 application topically 3 (three) times daily as needed.   OMEPRAZOLE (PRILOSEC) 20 MG CAPSULE    Take 20 mg by mouth daily.   ONDANSETRON (ZOFRAN-ODT) 4 MG DISINTEGRATING TABLET    Take 4 mg by mouth every 8 (eight) hours as needed for nausea or vomiting.   RIFAXIMIN (XIFAXAN) 550 MG TABS TABLET    Take 550 mg by mouth 2 (two) times daily.   SILDENAFIL (REVATIO) 20 MG TABLET    Take 2-3 tablets by mouth as needed. No more than 3 in a day   SILDENAFIL (VIAGRA) 50 MG TABLET    Take 1 tablet by mouth daily as needed.   SPIRONOLACTONE (ALDACTONE) 50 MG TABLET    Take 50 mg by mouth daily.   TRAZODONE (DESYREL) 50 MG TABLET    Take 50 mg by mouth at bedtime.   Patient Active Problem List   Diagnosis Date Noted  . Abdominal pain, epigastric 05/12/2015  . Abnormal LFTs 05/12/2015  . Ache in joint 05/12/2015  . Arthritis of knee 05/12/2015  . Cellulitis 05/12/2015  . Chronic pain 05/12/2015  . Clinical depression 05/12/2015  . Dysphagia 05/12/2015  . ED (erectile dysfunction) of organic origin 05/12/2015  . Headache 05/12/2015  . Hypokalemia 05/12/2015  .  Lumbar disc disease 05/12/2015  . Positive urine drug screen 05/12/2015  . Renal function impairment 05/12/2015  . Hernia, inguinal, right 05/12/2015  . Basal cell papilloma 05/12/2015  . Tachycardia 05/12/2015  . Benign hypertension 04/01/2015  . Arthritis 03/22/2015  . BP (high blood pressure) 03/22/2015  . Encephalopathy, hepatic 05/02/2014  . Migraine with aura 02/09/2010  . D (diarrhea) 03/21/2009  . Prediabetes 08/03/2008  . H/O bacterial pneumonia 11/11/2007  . Herniated cervical disc 07/07/2007  . Lumbar disc herniation 07/07/2007  . Sleep arousal disorder 06/10/2007  . Insomnia 06/10/2007  . Ascites 05/06/2007  . Esophageal reflux 04/01/2005  . Chronic pain associated with significant psychosocial  dysfunction 12/02/1998  . Depression, major, recurrent, moderate 12/02/1998  . Neuropathy, peripheral, autonomic, idiopathic 12/02/1998  . Hepatitis C 12/02/1988    Review of Systems  Constitutional: Negative for fever.  Respiratory: Negative for chest tightness and shortness of breath.   Cardiovascular: Positive for leg swelling. Negative for chest pain and palpitations.  Musculoskeletal: Positive for extremity weakness. Negative for stiffness.  Neurological: Positive for tingling, weakness and numbness. Negative for dizziness and headaches.   Patient had colonoscopy 05/12/2015, Dr. Candace Cruise at [facility].  History  Substance Use Topics  . Smoking status: Never Smoker   . Smokeless tobacco: Not on file  . Alcohol Use: No   Objective:   BP 140/80 mmHg  Pulse 112  Temp(Src) 98 F (36.7 C) (Oral)  Resp 16  Ht 5' 10.5" (1.791 m)  Wt 207 lb (93.895 kg)  BMI 29.27 kg/m2  SpO2 97%  Physical Exam  General Appearance:    Alert, cooperative, no distress  Eyes:    PERRL, conjunctiva/corneas clear, EOM's intact       Lungs:     Clear to auscultation bilaterally, respirations unlabored  Heart:    Regular rate and rhythm  Neurologic:   Awake, alert, oriented x 3. +5 muscle strength all extremities, but slightly diminished in LEs. Diminished somatosensation of both feet  Vascular +3 pedal pulses. No edema.         Assessment & Plan:     1. Numbness and tingling of both legs  - Vit D  25 hydroxy (rtn osteoporosis monitoring) - B12 - DG Lumbar Spine Complete; Future - Comp Met (CMET)  2. Herniated cervical disc  - predniSONE (DELTASONE) 10 MG tablet; 6 tablets for 1 day, then 5 for 1 day, then 4 for 1 day, then 3 for 1 day, then 2 for 1 day then 1 for 1 day.  Dispense: 21 tablet; Refill: 0 - oxyCODONE (ROXICODONE) 15 MG immediate release tablet; Take 2 tablets (30 mg total) by mouth every 4 (four) hours as needed. pain  Dispense: 300 tablet; Refill: 0  3. Neuropathy, peripheral,  autonomic, idiopathic  - Vit D  25 hydroxy (rtn osteoporosis monitoring) - B12  4. B12 deficiency-suspected  - B12  5. Vitamin D deficiency- suspected  - Vit D  25 hydroxy (rtn osteoporosis monitoring)  6. Hepatitis   7. Hepatic encephalopathy   8. Essential hypertension   9. Sleep apnea   10. Depression   11. Anxiety    Follow up: No Follow-up on file.

## 2015-05-12 NOTE — Anesthesia Preprocedure Evaluation (Signed)
Anesthesia Evaluation  Patient identified by MRN, date of birth, ID band Patient awake    Reviewed: Allergy & Precautions, NPO status , Patient's Chart, lab work & pertinent test results, reviewed documented beta blocker date and time   Airway Mallampati: II       Dental  (+) Upper Dentures, Lower Dentures   Pulmonary sleep apnea ,  breath sounds clear to auscultation  Pulmonary exam normal       Cardiovascular hypertension, Rhythm:Regular Rate:Tachycardia  Patient will take his metoprolol when he gets home His BP is running on the low side, so we will hold the BB now until we get more fluid in.   Neuro/Psych Anxiety Depression Hx of hepatic encephalopathy  Neuromuscular disease    GI/Hepatic negative GI ROS, (+) Hepatitis -, Unspecified  Endo/Other  negative endocrine ROS  Renal/GU negative Renal ROS  negative genitourinary   Musculoskeletal  (+) Arthritis -, Osteoarthritis,    Abdominal Normal abdominal exam  (+)   Peds negative pediatric ROS (+)  Hematology negative hematology ROS (+)   Anesthesia Other Findings   Reproductive/Obstetrics                             Anesthesia Physical Anesthesia Plan  ASA: III  Anesthesia Plan: General   Post-op Pain Management:    Induction: Intravenous  Airway Management Planned: Nasal Cannula  Additional Equipment:   Intra-op Plan:   Post-operative Plan:   Informed Consent: I have reviewed the patients History and Physical, chart, labs and discussed the procedure including the risks, benefits and alternatives for the proposed anesthesia with the patient or authorized representative who has indicated his/her understanding and acceptance.   Dental advisory given  Plan Discussed with: CRNA and Surgeon  Anesthesia Plan Comments:         Anesthesia Quick Evaluation

## 2015-05-17 ENCOUNTER — Encounter: Payer: Self-pay | Admitting: Gastroenterology

## 2015-05-26 DIAGNOSIS — B1920 Unspecified viral hepatitis C without hepatic coma: Secondary | ICD-10-CM | POA: Diagnosis not present

## 2015-05-29 NOTE — Patient Outreach (Signed)
[facility]) Care Management  05/29/2015  Ray Robles Jun 20, 1961 [id number]   Referral from [place] [facility], assigned Maury Dus, RN to outreach.  [name]. [place], [place] Management [place] Assistant Phone: [phone] Fax: [phone]

## 2015-05-31 ENCOUNTER — Ambulatory Visit (INDEPENDENT_AMBULATORY_CARE_PROVIDER_SITE_OTHER): Payer: Medicare Other | Admitting: Family Medicine

## 2015-05-31 ENCOUNTER — Encounter: Payer: Self-pay | Admitting: Family Medicine

## 2015-05-31 VITALS — BP 120/64 | HR 86 | Temp 98.3°F | Resp 16 | Wt 209.0 lb

## 2015-05-31 DIAGNOSIS — F331 Major depressive disorder, recurrent, moderate: Secondary | ICD-10-CM

## 2015-05-31 DIAGNOSIS — M5020 Other cervical disc displacement, unspecified cervical region: Secondary | ICD-10-CM

## 2015-05-31 DIAGNOSIS — M5126 Other intervertebral disc displacement, lumbar region: Secondary | ICD-10-CM | POA: Diagnosis not present

## 2015-05-31 DIAGNOSIS — K219 Gastro-esophageal reflux disease without esophagitis: Secondary | ICD-10-CM

## 2015-05-31 MED ORDER — OMEPRAZOLE 40 MG PO CPDR: 40.0000 mg | DELAYED_RELEASE_CAPSULE | Freq: Every day | ORAL | Status: DC

## 2015-05-31 MED ORDER — CYCLOBENZAPRINE HCL 5 MG PO TABS: 5.0000 mg | ORAL_TABLET | Freq: Three times a day (TID) | ORAL | Status: DC | PRN

## 2015-05-31 MED ORDER — OXYCODONE HCL 15 MG PO TABS: 30.0000 mg | ORAL_TABLET | ORAL | Status: DC | PRN

## 2015-05-31 MED ORDER — ESCITALOPRAM OXALATE 10 MG PO TABS: 10.0000 mg | ORAL_TABLET | Freq: Every day | ORAL | Status: DC

## 2015-05-31 NOTE — Progress Notes (Signed)
Patient: Ray Robles Male    DOB: [birth year]   54 y.o.   MRN: [id number] Visit Date: 05/31/2015  Today's Provider: Lelon Huh, MD   Chief Complaint  Patient presents with  . Back Pain   Subjective:    HPI  Chronic back pain Patient with long history of daily back pain secondary to DDD and disk herniations. He states he is tolerating his current medication regiment well without adverse effects. He continues to have pain on a daily basis, but is tolerable and able to complete ADLs with current medications.   Depression Continues on escitalopram which he feels is helping quite a bit, and trazodone at night, which he feels is working well to help sleep and with mood. Is tolerating medications without adverse effect.   GERD He states that omeprazole is working well with no adverse effects. He requests refill today.   Headaches:  He states that he takes flexeril 1-2 times most days for headaches which seem to be triggered from spasms in his neck. He is tolerating this well and wishes to get it refilled.   Previous Medications   CYCLOBENZAPRINE (FLEXERIL) 5 MG TABLET    Take 1-2 tablets by mouth every 8 (eight) hours as needed. For headache   DIPHENOXYLATE-ATROPINE (LOMOTIL) 2.5-0.025 MG PER TABLET    Take 2 tablets by mouth every 6 (six) hours as needed. For diarrhea   ESCITALOPRAM (LEXAPRO) 10 MG TABLET    Take 10 mg by mouth daily.   FUROSEMIDE (LASIX) 20 MG TABLET    Take 20 mg by mouth daily.   HYOSCYAMINE (ANASPAZ) 0.125 MG TBDP DISINTERGRATING TABLET    Place 0.125 mg under the tongue every 4 (four) hours as needed for bladder spasms or cramping.   LACTULOSE (CHRONULAC) 10 GM/15ML SOLUTION    Take 30 mLs by mouth 3 (three) times daily.   MELATONIN (MELATONIN MAXIMUM STRENGTH) 5 MG TABS    Take by mouth.   METOPROLOL TARTRATE (LOPRESSOR) 25 MG TABLET    Take 25 mg by mouth 2 (two) times daily.   NITROGLYCERIN (NITROSTAT) 0.4 MG SL TABLET    Place 0.4 mg under the  tongue every 5 (five) minutes as needed for chest pain.   NYSTATIN CREAM (MYCOSTATIN)    Apply 1 application topically 3 (three) times daily as needed.   OMEPRAZOLE (PRILOSEC) 20 MG CAPSULE    Take 20 mg by mouth daily.   ONDANSETRON (ZOFRAN-ODT) 4 MG DISINTEGRATING TABLET    Take 4 mg by mouth every 8 (eight) hours as needed for nausea or vomiting.   OXYCODONE (ROXICODONE) 15 MG IMMEDIATE RELEASE TABLET    Take 2 tablets (30 mg total) by mouth every 4 (four) hours as needed. pain   RIFAXIMIN (XIFAXAN) 550 MG TABS TABLET    Take 550 mg by mouth 2 (two) times daily.   SILDENAFIL (REVATIO) 20 MG TABLET    Take 2-3 tablets by mouth as needed. No more than 3 in a day   SILDENAFIL (VIAGRA) 50 MG TABLET    Take 1 tablet by mouth daily as needed.   SPIRONOLACTONE (ALDACTONE) 50 MG TABLET    Take 50 mg by mouth daily.   TRAZODONE (DESYREL) 50 MG TABLET    Take 50 mg by mouth at bedtime.    Review of Systems  Constitutional: Negative.   HENT: Negative.   Eyes: Negative.   Respiratory: Negative.   Cardiovascular: Negative.   Gastrointestinal: Negative.   Endocrine:  Negative.   Genitourinary: Negative.   Musculoskeletal: Positive for back pain.  Skin: Negative.   Allergic/Immunologic: Negative.   Neurological: Negative.   Hematological: Negative.   Psychiatric/Behavioral: Negative.     History  Substance Use Topics  . Smoking status: Never Smoker   . Smokeless tobacco: Not on file  . Alcohol Use: No   Objective:   BP 120/64 mmHg  Pulse 86  Temp(Src) 98.3 F (36.8 C) (Oral)  Resp 16  Wt 209 lb (94.802 kg)  Physical Exam General appearance: alert, well developed, well nourished, cooperative and in no distress Head: Normocephalic, without obvious abnormality, atraumatic Lungs: Respirations even and unlabored Extremities: No gross deformities Skin: Skin color, texture, turgor normal. No rashes seen  Psych: Appropriate mood and affect. Neurologic: Mental status: Alert, oriented to  person, place, and time, thought content appropriate.      Assessment & Plan:     1. Herniated cervical disc  - oxyCODONE (ROXICODONE) 15 MG immediate release tablet; Take 2 tablets (30 mg total) by mouth every 3 (three) hours as needed. pain  Dispense: 300 tablet; Refill: 0  2. Gastroesophageal reflux disease without esophagitis  - omeprazole (PRILOSEC) 40 MG capsule; Take 1 capsule (40 mg total) by mouth daily.  Dispense: 30 capsule; Refill: 5  3. Lumbar disc herniation  - cyclobenzaprine (FLEXERIL) 5 MG tablet; Take 1-2 tablets (5-10 mg total) by mouth every 8 (eight) hours as needed. For headache  Dispense: 60 tablet; Refill: 3  4. Depression, major, recurrent, moderate  - Melatonin (MELATONIN MAXIMUM STRENGTH) 5 MG TABS; Take by mouth. - escitalopram (LEXAPRO) 10 MG tablet; Take 1 tablet (10 mg total) by mouth daily.  Dispense: 30 tablet; Refill: 0   Follow up: No Follow-up on file.      Lelon Huh, MD  [facility]

## 2015-06-19 ENCOUNTER — Telehealth: Payer: Self-pay | Admitting: Family Medicine

## 2015-06-19 NOTE — Telephone Encounter (Signed)
Patient needs a refill on oxyCODONE (ROXICODONE) 15 MG immediate release tablet.  He uses [facility].  CB# [phone]

## 2015-06-20 ENCOUNTER — Other Ambulatory Visit: Payer: Self-pay | Admitting: Family Medicine

## 2015-06-20 DIAGNOSIS — M5020 Other cervical disc displacement, unspecified cervical region: Secondary | ICD-10-CM

## 2015-06-20 MED ORDER — OXYCODONE HCL 15 MG PO TABS: 30.0000 mg | ORAL_TABLET | ORAL | Status: DC | PRN

## 2015-07-03 ENCOUNTER — Other Ambulatory Visit: Payer: Self-pay

## 2015-07-03 NOTE — Patient Outreach (Signed)
[facility]) Care Management  07/03/2015  DAVAN NAWABI 1961-01-25 [id number]    RN CM attempted to reach patient to discuss [facility] services.  Patient was unavailable at the time and unable to leave a call back number.  RN CM will try back at a later date.   Maury Dus, RN, Ishmael Holter, [place] Telephonic Care Coordinator [phone]

## 2015-07-10 ENCOUNTER — Telehealth: Payer: Self-pay | Admitting: Family Medicine

## 2015-07-10 ENCOUNTER — Other Ambulatory Visit: Payer: Self-pay

## 2015-07-10 DIAGNOSIS — M5020 Other cervical disc displacement, unspecified cervical region: Secondary | ICD-10-CM

## 2015-07-10 NOTE — Telephone Encounter (Signed)
oxyCODONE (ROXICODONE) 15 MG immediate release tablet  Pt needs refill  Thanks teri

## 2015-07-10 NOTE — Telephone Encounter (Signed)
Pt called to see if RX was ready. Thanks TNP  °

## 2015-07-10 NOTE — Patient Outreach (Signed)
[facility]) Care Management  07/10/2015  Ray Robles 09/26/1961 [id number]   Telephone call to patient to explain and offer [place] Management services.  Patient refused [place] Management services at this time. Plan:  RN Health coach will forward patient to [name] to close patient due to refusal of services. RN Health Coach will notify patient primary MD of refusal of services.  RN Health Coach will send patient [place] Management contact information as requested.  [name], RN, MSN [place] [phone]

## 2015-07-11 MED ORDER — OXYCODONE HCL 15 MG PO TABS: 30.0000 mg | ORAL_TABLET | ORAL | Status: DC | PRN

## 2015-07-18 NOTE — Patient Outreach (Signed)
[facility]) Care Management  07/10/2015  CURBY CARSWELL 26-Feb-1961 [id number]   Notification from Jon Billings, RN to close case due to patient refused [place] Management services.  Thanks, [name]. [place], [place] Assistant Phone: [phone] Fax: [phone]

## 2015-07-31 ENCOUNTER — Encounter: Payer: Self-pay | Admitting: Family Medicine

## 2015-07-31 ENCOUNTER — Ambulatory Visit (INDEPENDENT_AMBULATORY_CARE_PROVIDER_SITE_OTHER): Payer: Medicare Other | Admitting: Family Medicine

## 2015-07-31 VITALS — BP 136/78 | HR 80 | Temp 98.0°F | Resp 16 | Ht 70.5 in | Wt 200.4 lb

## 2015-07-31 DIAGNOSIS — G9009 Other idiopathic peripheral autonomic neuropathy: Secondary | ICD-10-CM

## 2015-07-31 DIAGNOSIS — M7061 Trochanteric bursitis, right hip: Secondary | ICD-10-CM | POA: Diagnosis not present

## 2015-07-31 DIAGNOSIS — M5020 Other cervical disc displacement, unspecified cervical region: Secondary | ICD-10-CM

## 2015-07-31 MED ORDER — OXYCODONE HCL 15 MG PO TABS
30.0000 mg | ORAL_TABLET | ORAL | Status: DC | PRN
Start: 2015-07-31 — End: 2015-08-21

## 2015-07-31 MED ORDER — METHYLPREDNISOLONE ACETATE 80 MG/ML IJ SUSP
80.0000 mg | Freq: Once | INTRAMUSCULAR | Status: AC
  Administered 2015-07-31: 80 mg

## 2015-07-31 NOTE — Progress Notes (Signed)
Patient: Ray Robles Male    DOB: February 25, 1961   54 y.o.   MRN: [id number] Visit Date: 07/31/2015  Today's Provider: Lelon Huh, MD   Chief Complaint  Patient presents with  . Hip Pain    Bilateral   Subjective:    Hip Pain  The incident occurred 3 to 5 days ago (last friday, it started hurting worse ). The pain is present in the left hip, left leg, left foot, left toes, right foot and right toes (Dr Hollace Hayward on [place] seen pt. feeling burning pain and numbness on bilateral feet and toes.). The quality of the pain is described as burning and aching. The pain is at a severity of 6/10 (hip hurt more in the mornings, the toes and feet hurt all the time.). The pain has been worsening since onset. Associated symptoms include a loss of motion, numbness and tingling. Treatments tried: pain medication. The treatment provided no relief (not on the hips.).   He has had hip pain for the last year and was seen by Dr. Cristi Loron in 2015. He was diagnosed with DDD and trochanteric bursitis. He did take prednisone in the past, but has never had steroid injection for this. States pain in hips are keeping him up at night and pain is getting worse.   Pain: He reports oxycontin continues to proved significant pain relief for his neck and back pain due to disc disease. It does not seem to be losing its effectiveness. He is due for refill.   Allergies  Allergen Reactions  . Ambien  [Zolpidem Tartrate]     Violent sleep  . Ibuprofen Other (See Comments)    Can't take because of liver  . Morphine Nausea Only, Nausea And Vomiting and Other (See Comments)    Other reaction(s): Vomiting N&V, Diarrhea  . Morphine And Related Nausea And Vomiting    N&V, Diarrhea  . Tylenol [Acetaminophen] Other (See Comments)    Can't take because of liver  . Xanax [Alprazolam] Other (See Comments)    Brings the ammonia levels up  . Zolpidem Other (See Comments)    Violent Sleep   Previous Medications     CYCLOBENZAPRINE (FLEXERIL) 5 MG TABLET    Take 1-2 tablets (5-10 mg total) by mouth every 8 (eight) hours as needed. For headache   DIPHENOXYLATE-ATROPINE (LOMOTIL) 2.5-0.025 MG PER TABLET    Take 2 tablets by mouth every 6 (six) hours as needed. For diarrhea   ESCITALOPRAM (LEXAPRO) 10 MG TABLET    Take 1 tablet (10 mg total) by mouth daily.   FUROSEMIDE (LASIX) 20 MG TABLET    Take 20 mg by mouth daily.   HYOSCYAMINE (ANASPAZ) 0.125 MG TBDP DISINTERGRATING TABLET    Place 0.125 mg under the tongue every 4 (four) hours as needed for bladder spasms or cramping.   LACTULOSE (CHRONULAC) 10 GM/15ML SOLUTION    Take 30 mLs by mouth 3 (three) times daily.   MELATONIN (MELATONIN MAXIMUM STRENGTH) 5 MG TABS    Take by mouth.   METOPROLOL TARTRATE (LOPRESSOR) 25 MG TABLET    Take 25 mg by mouth 2 (two) times daily.   NITROGLYCERIN (NITROSTAT) 0.4 MG SL TABLET    Place 0.4 mg under the tongue every 5 (five) minutes as needed for chest pain.   NYSTATIN CREAM (MYCOSTATIN)    Apply 1 application topically 3 (three) times daily as needed.   OMEPRAZOLE (PRILOSEC) 40 MG CAPSULE    Take 1  capsule (40 mg total) by mouth daily.   ONDANSETRON (ZOFRAN-ODT) 4 MG DISINTEGRATING TABLET    Take 4 mg by mouth every 8 (eight) hours as needed for nausea or vomiting.   OXYCODONE (ROXICODONE) 15 MG IMMEDIATE RELEASE TABLET    Take 2 tablets (30 mg total) by mouth every 3 (three) hours as needed. pain   RIFAXIMIN (XIFAXAN) 550 MG TABS TABLET    Take 550 mg by mouth 2 (two) times daily.   SILDENAFIL (REVATIO) 20 MG TABLET    Take 2-3 tablets by mouth as needed. No more than 3 in a day   SILDENAFIL (VIAGRA) 50 MG TABLET    Take 1 tablet by mouth daily as needed.   SPIRONOLACTONE (ALDACTONE) 50 MG TABLET    Take 50 mg by mouth daily.   TRAZODONE (DESYREL) 50 MG TABLET    Take 50 mg by mouth at bedtime.    Review of Systems  Cardiovascular: Positive for leg swelling.       Hands and feet.  Neurological: Positive for  tingling and numbness.    Social History  Substance Use Topics  . Smoking status: Never Smoker   . Smokeless tobacco: Not on file  . Alcohol Use: No   Objective:   BP 136/78 mmHg  Pulse 80  Temp(Src) 98 F (36.7 C) (Oral)  Resp 16  Ht 5' 10.5" (1.791 m)  Wt 200 lb 6.4 oz (90.901 kg)  BMI 28.34 kg/m2  SpO2 98%  Physical Exam   General Appearance:    Alert, cooperative, no distress  Eyes:    PERRL, conjunctiva/corneas clear, EOM's intact       Lungs:     Clear to auscultation bilaterally, respirations unlabored  Heart:    Regular rate and rhythm  Neurologic:   Awake, alert, oriented x 3. No apparent focal neurological           defect.   MS:    Very tender over right greater trochanter. Mild tenderness of left greater trochanter. No swelling and no erythema   Diffuse tenderness of lumbar and cervical spine. Normal muscle strength.        Assessment & Plan:     1. Herniated cervical disc Stable on current dose of oxycodone - oxyCODONE (ROXICODONE) 15 MG immediate release tablet; Take 2 tablets (30 mg total) by mouth every 3 (three) hours as needed. pain  Dispense: 300 tablet; Refill: 0  2. Neuropathy, peripheral, autonomic, idiopathic Consider TCA. Not a good candidate for gabapentin or Lyrica due to depression.  3. Trochanteric bursitis of right hip  - methylPREDNISolone acetate (DEPO-MEDROL) injection 80 mg; 1 mL (80 mg total) by Other route once.       Lelon Huh, MD  [facility]

## 2015-08-12 IMAGING — CT CT HEAD WITHOUT CONTRAST
1 series · 16 of 30 positions shown, 20 images · non-contrast
Comparison: none

REASON FOR EXAM: confusion
COMMENTS:

[Series 2: soft tissue · axial · 0.44mm/px · z∈[+510,+645]mm · 16 of 31 slices shown, 20 images]
[im 2/31  brain]
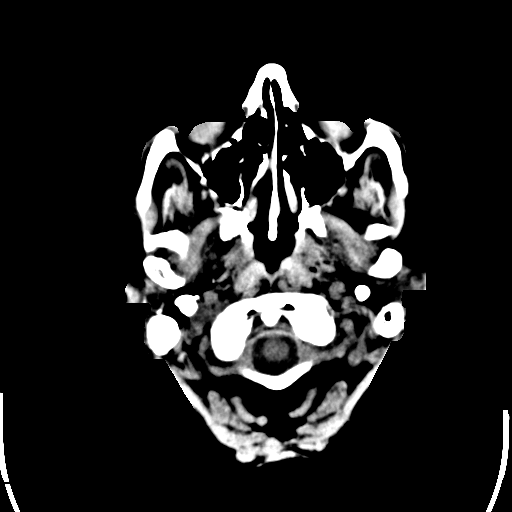
[im 2/31  bone]
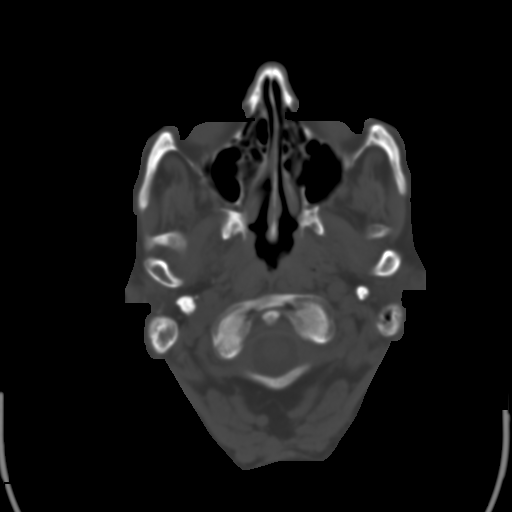
[im 4/31  brain]
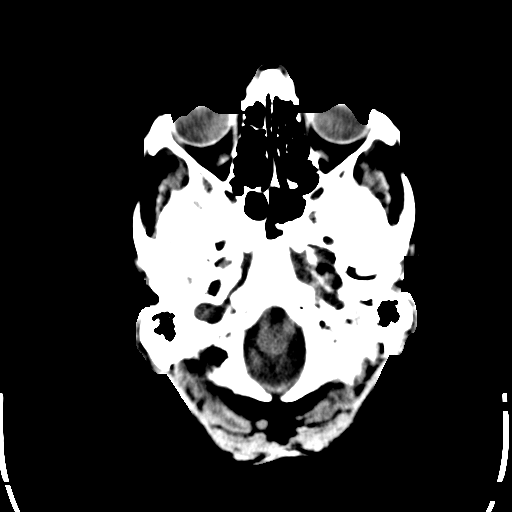
[im 6/31  brain]
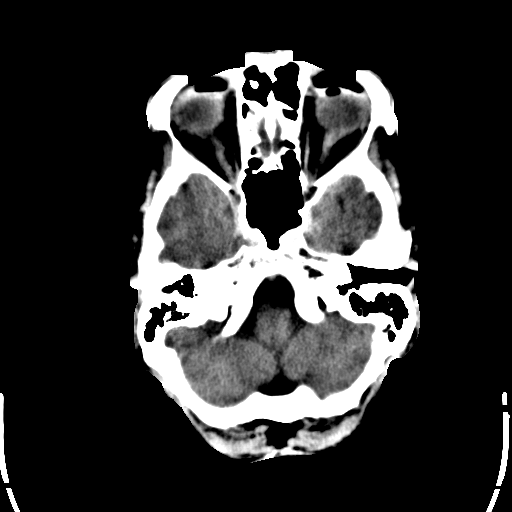
[im 8/31  brain]
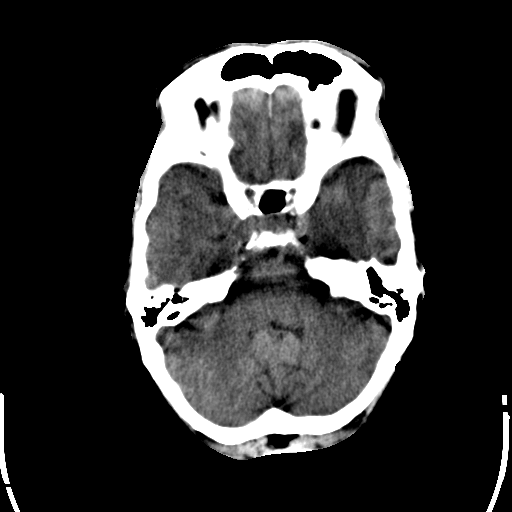
[im 9/31  brain]
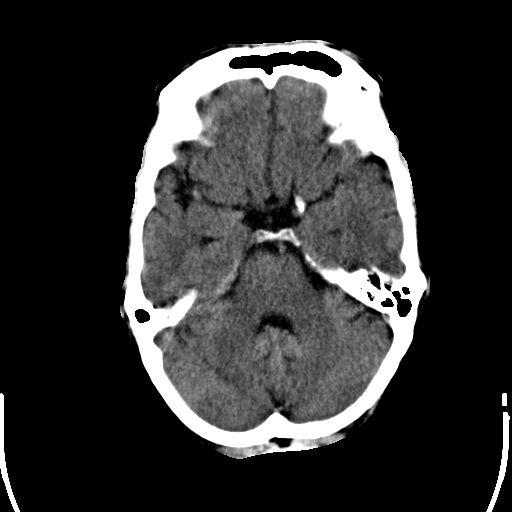
[im 9/31  bone]
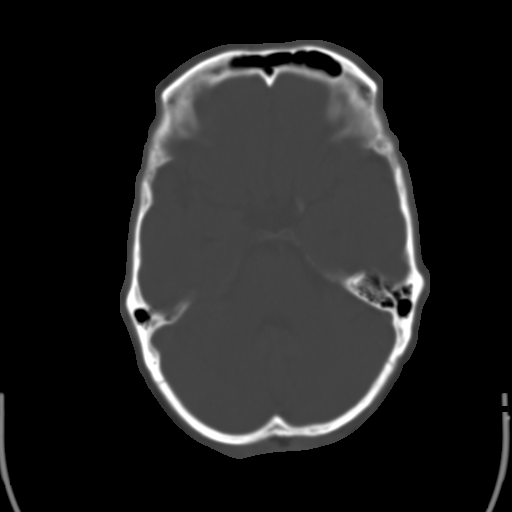
[im 11/31  brain]
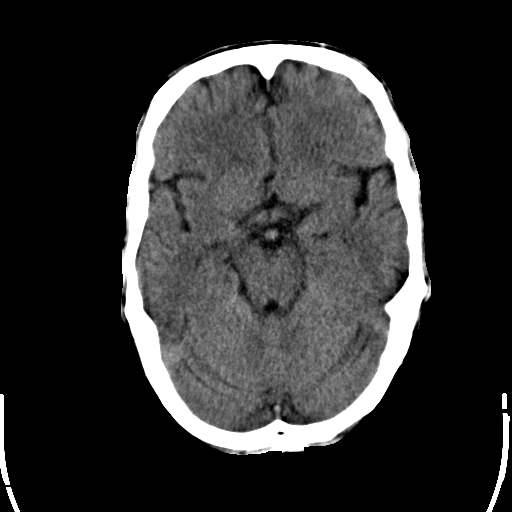
[im 13/31  brain]
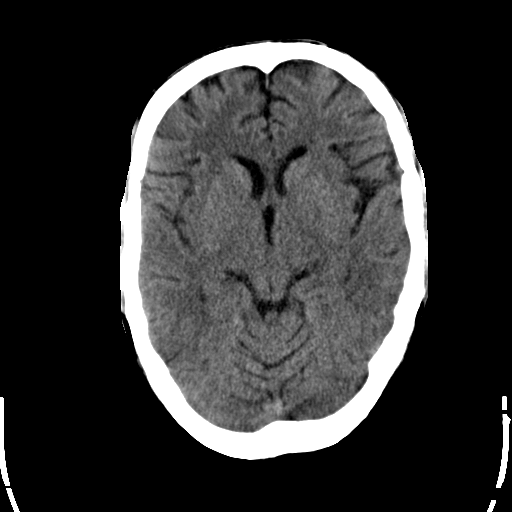
[im 15/31  brain]
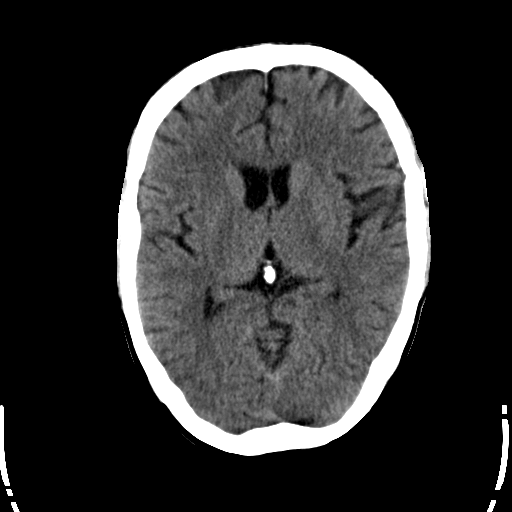
[im 16/31  brain]
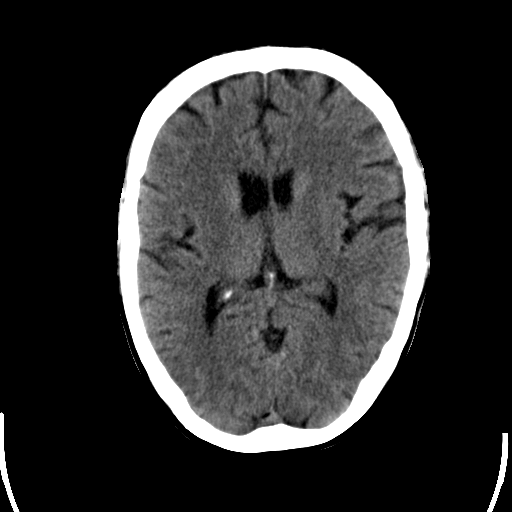
[im 16/31  bone]
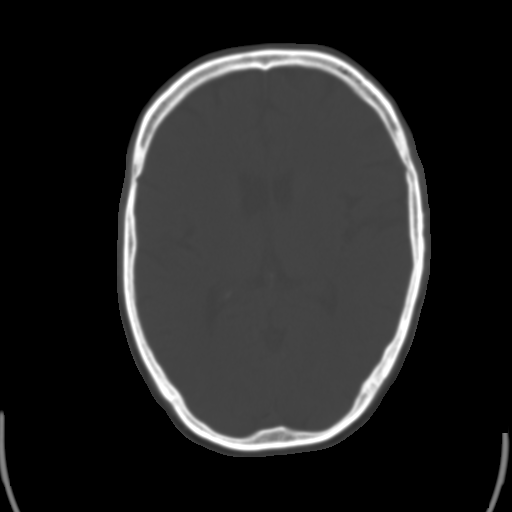
[im 18/31  brain]
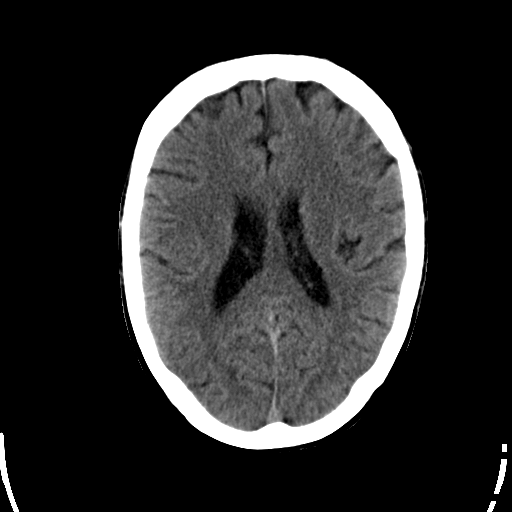
[im 20/31  brain]
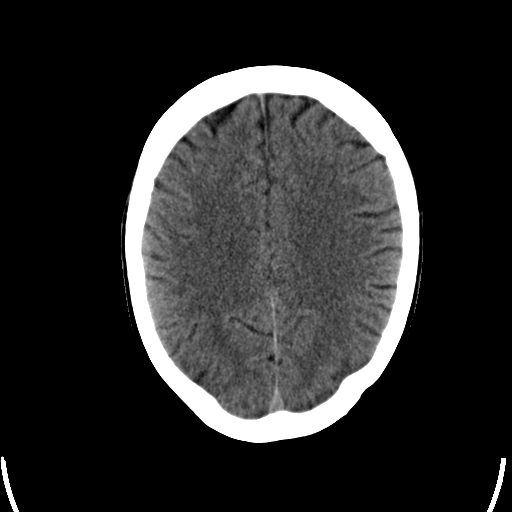
[im 22/31  brain]
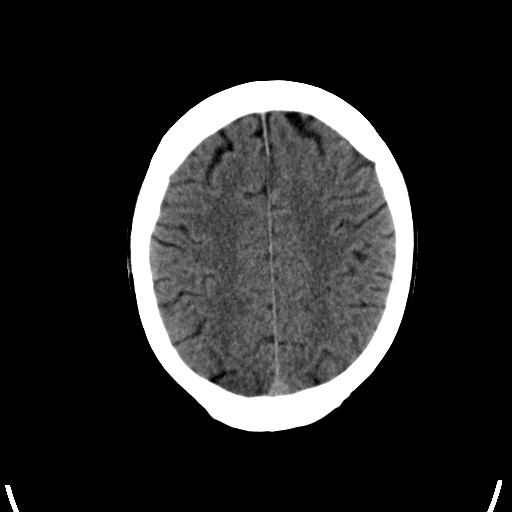
[im 23/31  brain]
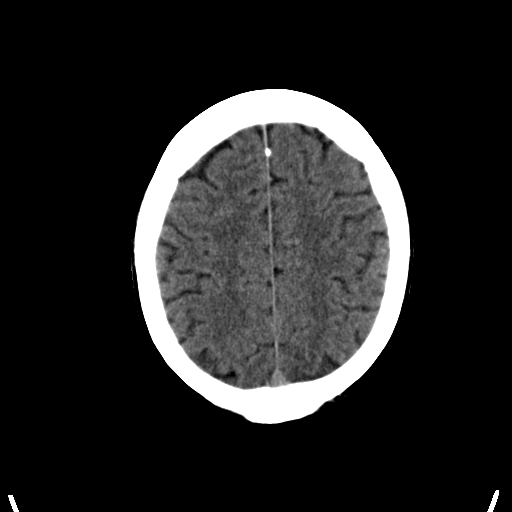
[im 23/31  bone]
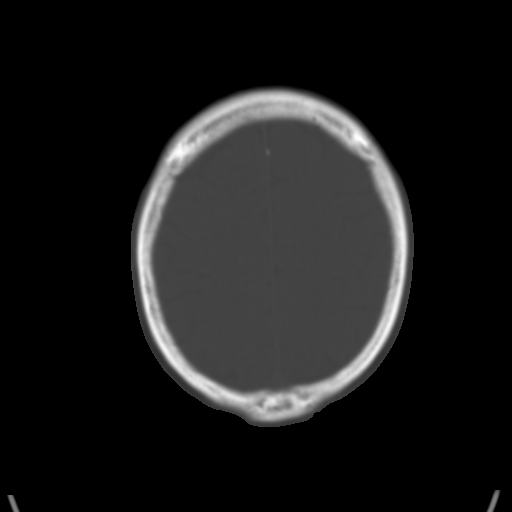
[im 25/31  brain]
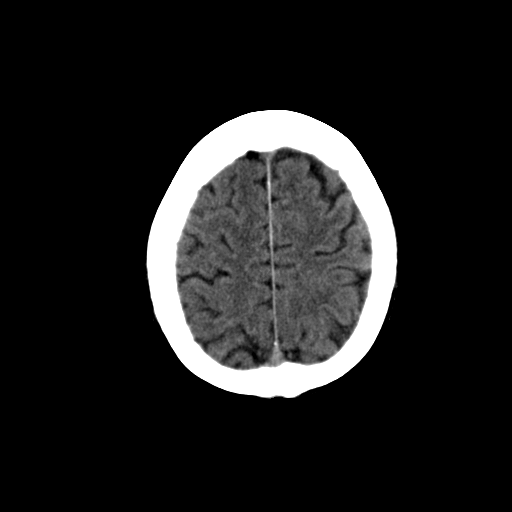
[im 27/31  brain]
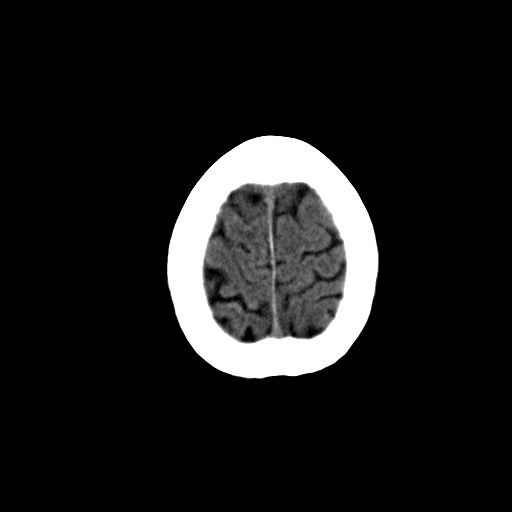
[im 29/31  brain]
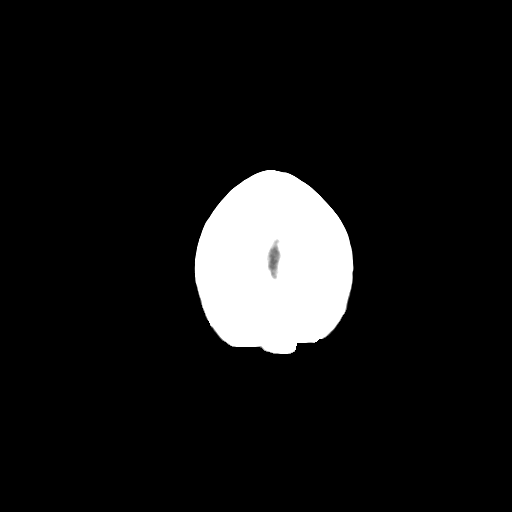

[16 of 30 positions shown; findings below may reference images not displayed]

PROCEDURE:     CT  - CT HEAD WITHOUT CONTRAST  - August 09, 2013  [DATE]

RESULT:     Axial noncontrast CT scanning was performed through the brain
with reconstructions at 5 mm intervals and slice thicknesses.

The ventricles are normal in size and position. There is no intracranial
hemorrhage nor intracranial mass effect. There is no evidence of an evolving
ischemic infarction. The cerebellum and brainstem are normal in density.

At bone window settings there is no evidence of an acute skull fracture. The
observed portions of the paranasal sinuses are clear.
IMPRESSION: There is no acute intracranial abnormality.

[REDACTED]

## 2015-08-12 IMAGING — CR DG CHEST 2V
1 series · 3 of 3 positions shown · non-contrast
Comparison: none

REASON FOR EXAM: ams
COMMENTS:

[Series 1: pa · 0.17mm/px · 3 of 3 slices shown]
[im 1/3]
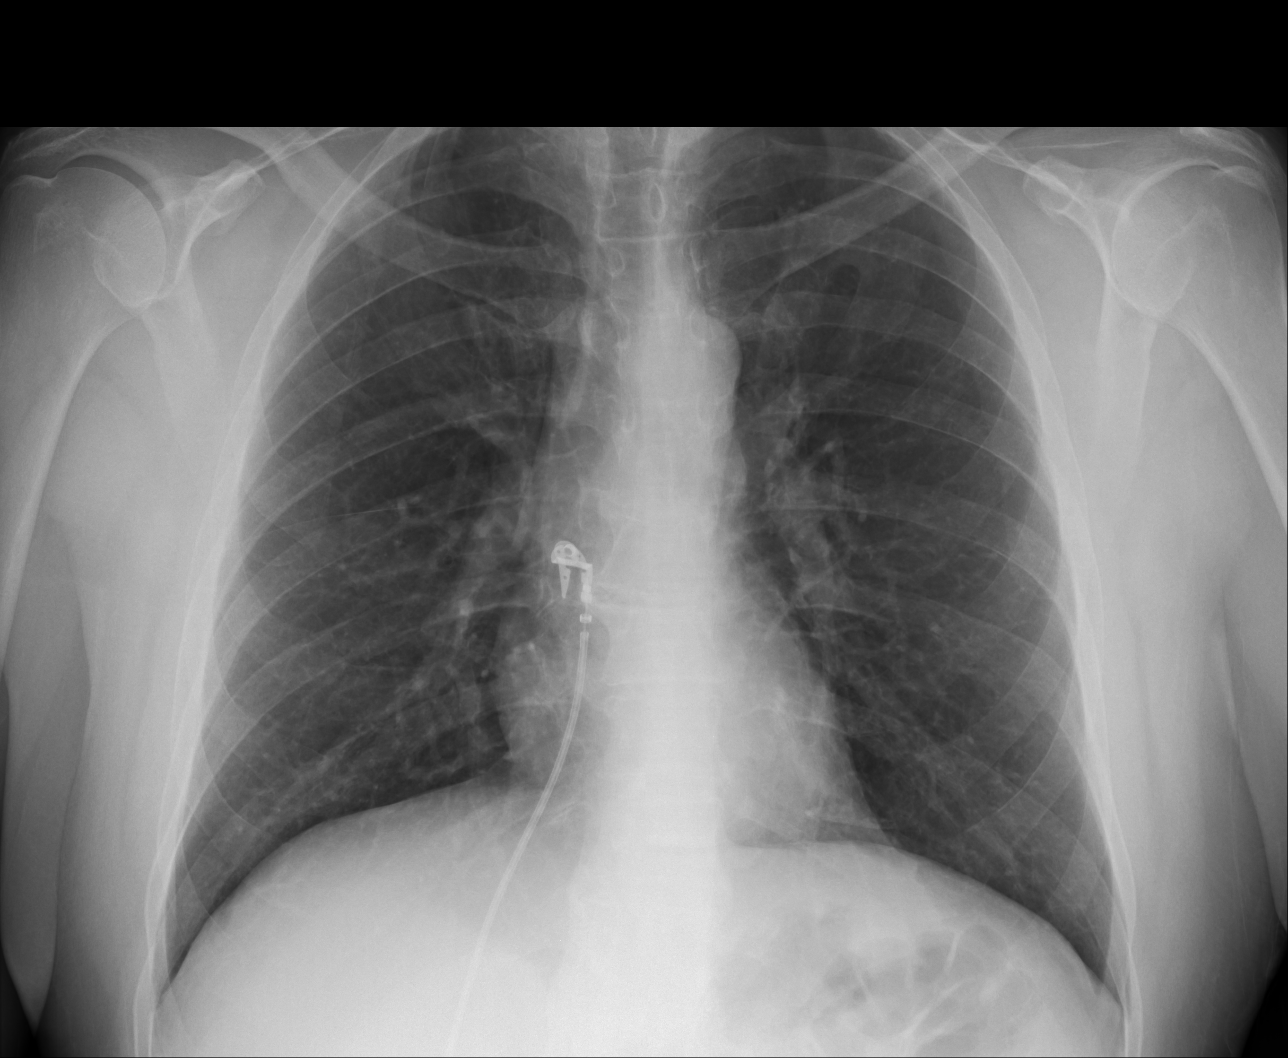
[im 2/3]
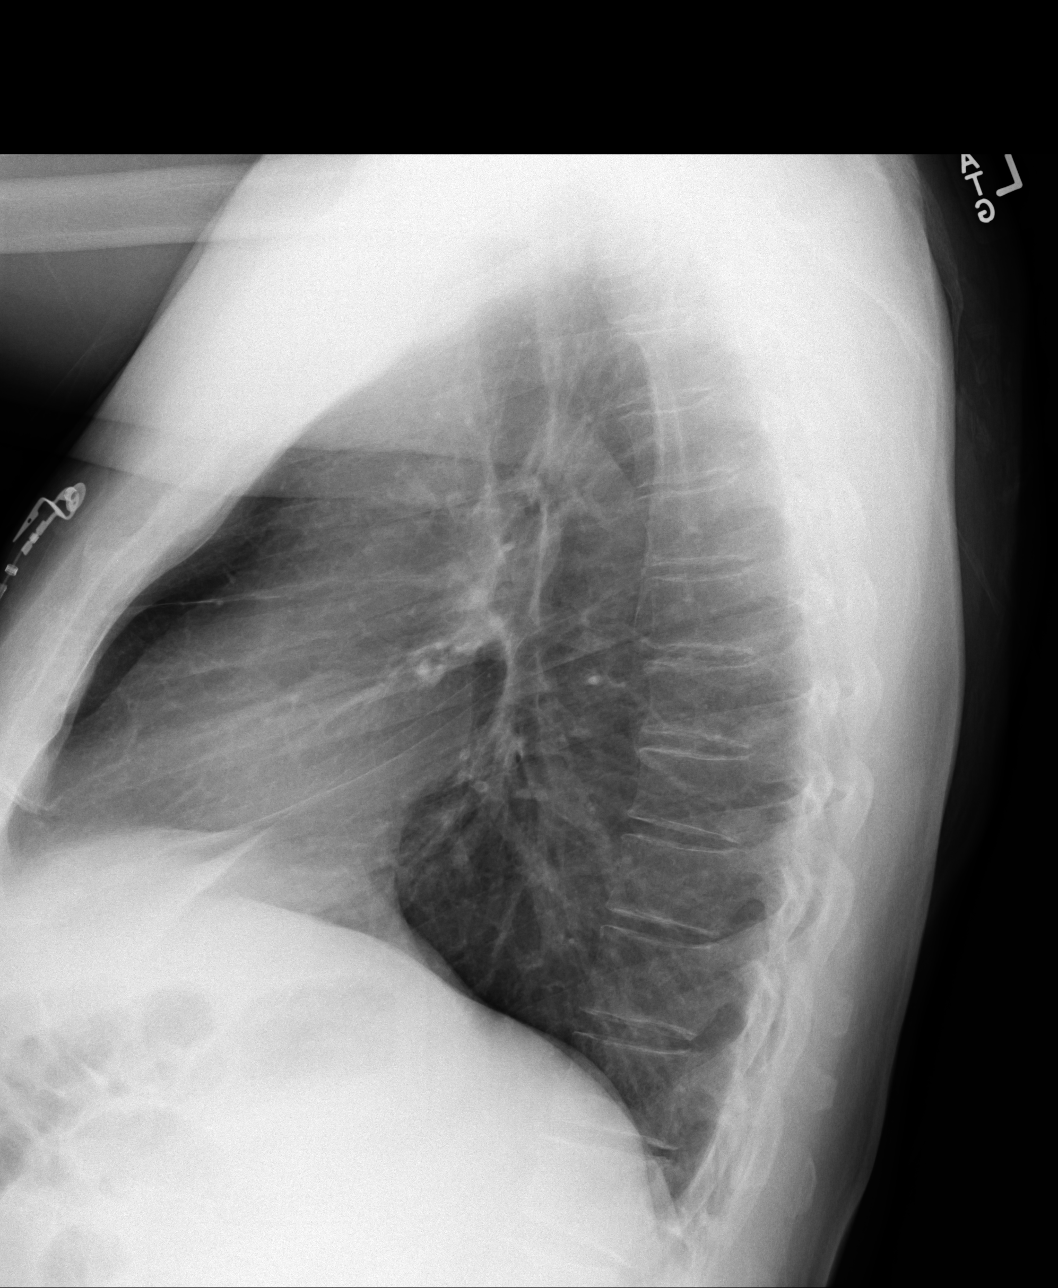
[im 3/3]
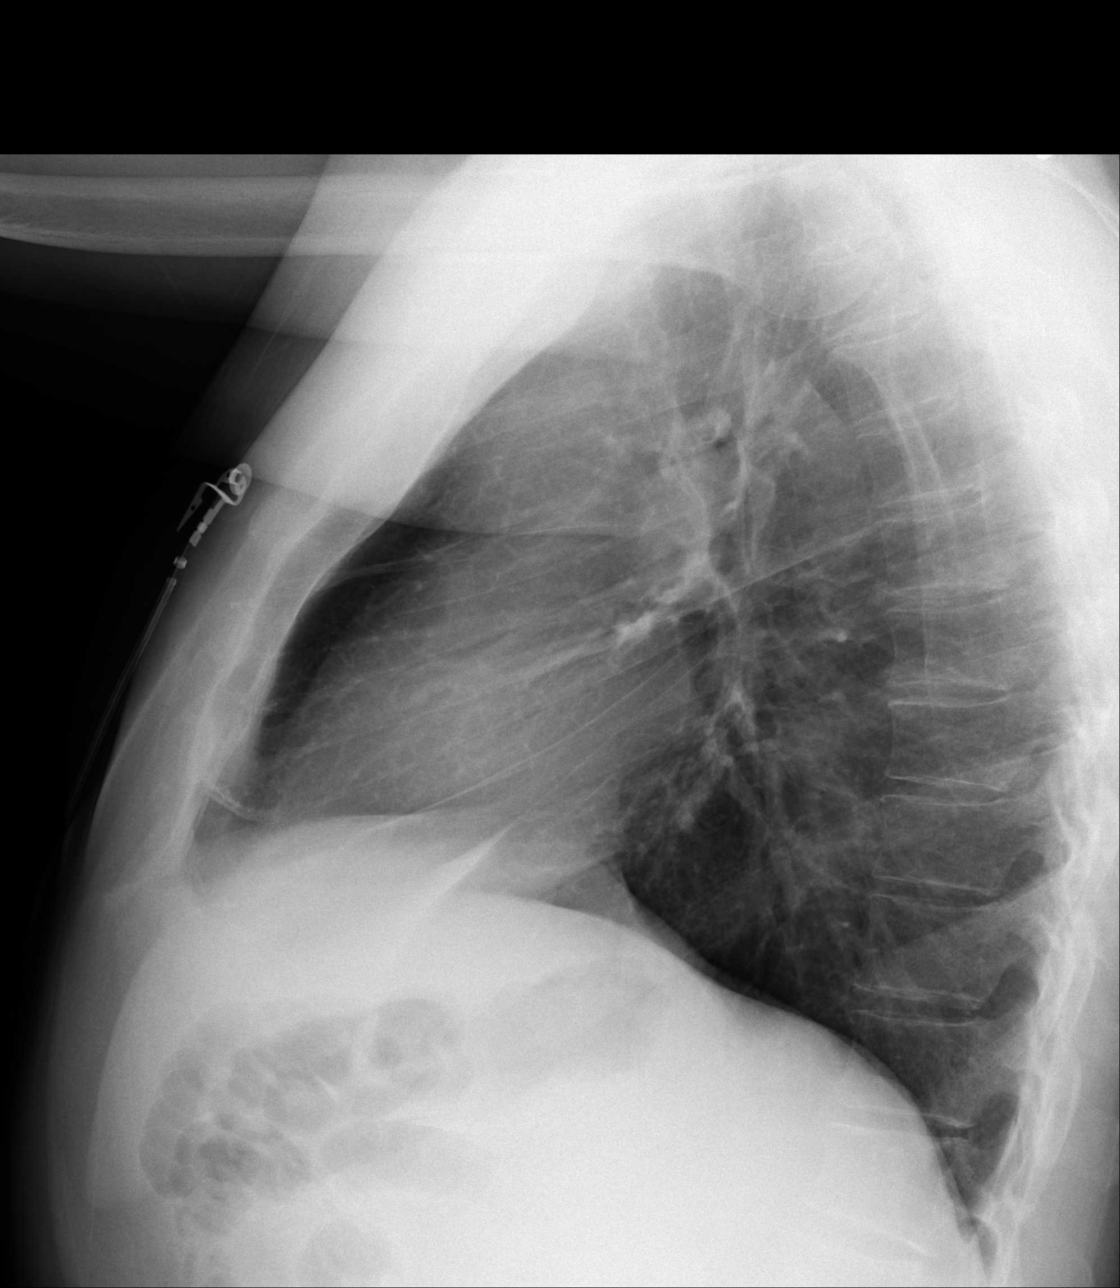

[3 of 3 positions shown; findings below may reference images not displayed]

PROCEDURE:     DXR - DXR CHEST PA (OR AP) AND LATERAL  - August 09, 2013  [DATE]

RESULT:     Comparison is made to a study May 10, 2013.

The lungs are well-expanded. There is no focal infiltrate. The cardiac
silhouette is normal in size. The mediastinum is normal in width. There is
no pleural effusion.
IMPRESSION: There is no evidence of acute cardiopulmonary abnormality.
I cannot exclude acute bronchitis in the appropriate clinical setting.

[REDACTED]

## 2015-08-21 ENCOUNTER — Encounter: Payer: Self-pay | Admitting: Family Medicine

## 2015-08-21 ENCOUNTER — Ambulatory Visit (INDEPENDENT_AMBULATORY_CARE_PROVIDER_SITE_OTHER): Payer: Medicare Other | Admitting: Family Medicine

## 2015-08-21 VITALS — BP 116/70 | HR 90 | Temp 98.9°F | Resp 16 | Ht 70.5 in | Wt 199.0 lb

## 2015-08-21 DIAGNOSIS — M7062 Trochanteric bursitis, left hip: Secondary | ICD-10-CM

## 2015-08-21 DIAGNOSIS — M5020 Other cervical disc displacement, unspecified cervical region: Secondary | ICD-10-CM | POA: Diagnosis not present

## 2015-08-21 DIAGNOSIS — M5126 Other intervertebral disc displacement, lumbar region: Secondary | ICD-10-CM

## 2015-08-21 MED ORDER — OXYCODONE HCL 15 MG PO TABS: 30.0000 mg | ORAL_TABLET | ORAL | Status: DC | PRN

## 2015-08-21 MED ORDER — METHYLPREDNISOLONE ACETATE 80 MG/ML IJ SUSP
80.0000 mg | Freq: Once | INTRAMUSCULAR | Status: AC
  Administered 2015-08-21: 80 mg via INTRAMUSCULAR

## 2015-08-21 NOTE — Progress Notes (Signed)
Patient: Ray Robles Male    DOB: 1961-03-05   54 y.o.   MRN: [id number] Visit Date: 08/21/2015  Today's Provider: Lelon Huh, MD   Chief Complaint  Patient presents with  . Follow-up   Subjective:    HPI  Hip pain Complains of persistent pain left hip for the last few months. Was seen for similar pain in his right hip on 07/31/2015  He was treated with inection of depo-medrol right greater trochanter. He states pain gradually resolved over the course of the next week and has not returned. He requests same injection for his left hip  Chronic pain He states oxycodone continues to provide adequate pain relief and is need of printed presciption.    Allergies  Allergen Reactions  . Ambien  [Zolpidem Tartrate]     Violent sleep  . Ibuprofen Other (See Comments)    Can't take because of liver  . Morphine Nausea Only, Nausea And Vomiting and Other (See Comments)    Other reaction(s): Vomiting N&V, Diarrhea  . Morphine And Related Nausea And Vomiting    N&V, Diarrhea  . Tylenol [Acetaminophen] Other (See Comments)    Can't take because of liver  . Xanax [Alprazolam] Other (See Comments)    Brings the ammonia levels up  . Zolpidem Other (See Comments)    Violent Sleep   Previous Medications   CYCLOBENZAPRINE (FLEXERIL) 5 MG TABLET    Take 1-2 tablets (5-10 mg total) by mouth every 8 (eight) hours as needed. For headache   DIPHENOXYLATE-ATROPINE (LOMOTIL) 2.5-0.025 MG PER TABLET    Take 2 tablets by mouth every 6 (six) hours as needed. For diarrhea   ESCITALOPRAM (LEXAPRO) 10 MG TABLET    Take 1 tablet (10 mg total) by mouth daily.   FUROSEMIDE (LASIX) 20 MG TABLET    Take 20 mg by mouth daily.   HYOSCYAMINE (ANASPAZ) 0.125 MG TBDP DISINTERGRATING TABLET    Place 0.125 mg under the tongue every 4 (four) hours as needed for bladder spasms or cramping.   LACTULOSE (CHRONULAC) 10 GM/15ML SOLUTION    Take 30 mLs by mouth 3 (three) times daily.   MELATONIN (MELATONIN  MAXIMUM STRENGTH) 5 MG TABS    Take by mouth.   METOPROLOL TARTRATE (LOPRESSOR) 25 MG TABLET    Take 25 mg by mouth 2 (two) times daily.   NITROGLYCERIN (NITROSTAT) 0.4 MG SL TABLET    Place 0.4 mg under the tongue every 5 (five) minutes as needed for chest pain.   NYSTATIN CREAM (MYCOSTATIN)    Apply 1 application topically 3 (three) times daily as needed.   OMEPRAZOLE (PRILOSEC) 40 MG CAPSULE    Take 1 capsule (40 mg total) by mouth daily.   ONDANSETRON (ZOFRAN-ODT) 4 MG DISINTEGRATING TABLET    Take 4 mg by mouth every 8 (eight) hours as needed for nausea or vomiting.   OXYCODONE (ROXICODONE) 15 MG IMMEDIATE RELEASE TABLET    Take 2 tablets (30 mg total) by mouth every 3 (three) hours as needed. pain   RIFAXIMIN (XIFAXAN) 550 MG TABS TABLET    Take 550 mg by mouth 2 (two) times daily.   SILDENAFIL (REVATIO) 20 MG TABLET    Take 2-3 tablets by mouth as needed. No more than 3 in a day   SILDENAFIL (VIAGRA) 50 MG TABLET    Take 1 tablet by mouth daily as needed.   SPIRONOLACTONE (ALDACTONE) 50 MG TABLET    Take 50 mg by  mouth daily.   TRAZODONE (DESYREL) 50 MG TABLET    Take 50 mg by mouth at bedtime.    Review of Systems  Cardiovascular: Negative.   Neurological: Positive for headaches. Negative for dizziness and light-headedness.    Social History  Substance Use Topics  . Smoking status: Never Smoker   . Smokeless tobacco: Not on file  . Alcohol Use: No   Objective:   BP 116/70 mmHg  Pulse 90  Temp(Src) 98.9 F (37.2 C) (Oral)  Resp 16  Ht 5' 10.5" (1.791 m)  Wt 199 lb (90.266 kg)  BMI 28.14 kg/m2  SpO2 98%  Physical Exam   General Appearance:    Alert, cooperative, no distress  Eyes:    PERRL, conjunctiva/corneas clear, EOM's intact       Lungs:     Clear to auscultation bilaterally, respirations unlabored  Heart:    Regular rate and rhythm  Neurologic:   Awake, alert, oriented x 3. No apparent focal neurological           defect.   MS:    Very tender over left greater  trochanter. Mild tenderness of left greater trochanter. No swelling and no erythema   Diffuse tenderness of lumbar and cervical spine. Normal muscle strength.       Assessment & Plan:     1. Herniated cervical disc Stable on current dose of oxycodone - oxyCODONE (ROXICODONE) 15 MG immediate release tablet; Take 2 tablets (30 mg total) by mouth every 3 (three) hours as needed. pain  Dispense: 300 tablet; Refill: 0  2. Trochanteric bursitis of left hip Prepped skin over left greater trochanter with isopropyl alcohol. Injected 34ml methylprenisolone over left greater trochanter. Covered injection site with adhesive bandage. Patient tolerated procedure well.  - methylPREDNISolone acetate (DEPO-MEDROL) injection 80 mg; 1 mL (80 mg total) by Other route once.      Lelon Huh, MD  [facility]

## 2015-08-23 ENCOUNTER — Other Ambulatory Visit: Payer: Self-pay | Admitting: *Deleted

## 2015-08-23 DIAGNOSIS — F331 Major depressive disorder, recurrent, moderate: Secondary | ICD-10-CM

## 2015-08-23 MED ORDER — ESCITALOPRAM OXALATE 10 MG PO TABS: 10.0000 mg | ORAL_TABLET | Freq: Every day | ORAL | Status: DC

## 2015-08-28 DIAGNOSIS — G8929 Other chronic pain: Secondary | ICD-10-CM | POA: Diagnosis not present

## 2015-08-28 DIAGNOSIS — B1920 Unspecified viral hepatitis C without hepatic coma: Secondary | ICD-10-CM | POA: Diagnosis not present

## 2015-08-28 DIAGNOSIS — B182 Chronic viral hepatitis C: Secondary | ICD-10-CM | POA: Diagnosis not present

## 2015-08-28 DIAGNOSIS — K7469 Other cirrhosis of liver: Secondary | ICD-10-CM | POA: Diagnosis not present

## 2015-08-28 LAB — HM HIV SCREENING LAB: HM HIV Screening: NEGATIVE

## 2015-08-30 ENCOUNTER — Encounter: Payer: Self-pay | Admitting: Family Medicine

## 2015-08-30 ENCOUNTER — Ambulatory Visit (INDEPENDENT_AMBULATORY_CARE_PROVIDER_SITE_OTHER): Payer: Medicare Other | Admitting: Family Medicine

## 2015-08-30 VITALS — BP 124/72 | HR 67 | Temp 98.0°F | Resp 16 | Wt 201.0 lb

## 2015-08-30 DIAGNOSIS — F331 Major depressive disorder, recurrent, moderate: Secondary | ICD-10-CM

## 2015-08-30 DIAGNOSIS — K7460 Unspecified cirrhosis of liver: Secondary | ICD-10-CM | POA: Diagnosis not present

## 2015-08-30 DIAGNOSIS — G8929 Other chronic pain: Secondary | ICD-10-CM

## 2015-08-30 DIAGNOSIS — G9009 Other idiopathic peripheral autonomic neuropathy: Secondary | ICD-10-CM

## 2015-08-30 DIAGNOSIS — B182 Chronic viral hepatitis C: Secondary | ICD-10-CM | POA: Diagnosis not present

## 2015-08-30 DIAGNOSIS — Z23 Encounter for immunization: Secondary | ICD-10-CM | POA: Diagnosis not present

## 2015-08-30 NOTE — Progress Notes (Signed)
Patient: Ray Robles Male    DOB: 1961/06/07   54 y.o.   MRN: [id number] Visit Date: 08/30/2015  Today's Provider: Lelon Huh, MD   Chief Complaint  Patient presents with  . Hypertension    follow up  . Pain    follow up  . Depression    follow up   Subjective:    HPI  Hypertension, follow-up:  BP Readings from Last 3 Encounters:  08/30/15 124/72  08/21/15 116/70  07/31/15 136/78    He was last seen for hypertension 3 months ago.  BP at that visit was  140/80. Management since that visit includes  none. He reports fair compliance with treatment. Patient states he has missed a few doses of medication.  He is not having side effects. He is not exercising. He is not adherent to low salt diet.   Outside blood pressures are  170's/ 105. He is experiencing none.  Patient denies chest pain, chest pressure/discomfort, claudication, dyspnea, exertional chest pressure/discomfort, fatigue, irregular heart beat, lower extremity edema, near-syncope and orthopnea.   Cardiovascular risk factors include hypertension and male gender.  Use of agents associated with hypertension: none.     Weight trend: decreasing steadily Wt Readings from Last 3 Encounters:  08/30/15 201 lb (91.173 kg)  08/21/15 199 lb (90.266 kg)  07/31/15 200 lb 6.4 oz (90.901 kg)    Current diet: on average, 1 meals per day  ------------------------------------------------------------------------  Hepatitis C Was seen at Liver clinic at [facility] 2 days ago and told that liver disease has advance to stage IV. He is schedule for MRI to rule out [place]. If negative he will likely start treatment for Hepatitis C and possibly put on transplant list. He has been quite stressed since being told liver disease was more advanced than he realized.    Depression Follow up: Last office visit was 3 months ago and no changes were made. Patient reports good compliance with treat ment, good tolerance and fair symptom  control. Patient states today he feels better that he has been feeling in several months. Patient has felt more depressed. He requests referral to psychiatry   Chronic pain: Last office visit was 1 month ago and no changes were made. Patient reports good compliance with treatment and states his pain is controlled while taking medication.     Allergies  Allergen Reactions  . Ambien  [Zolpidem Tartrate]     Violent sleep  . Ibuprofen Other (See Comments)    Can't take because of liver  . Morphine Nausea Only, Nausea And Vomiting and Other (See Comments)    Other reaction(s): Vomiting N&V, Diarrhea  . Morphine And Related Nausea And Vomiting    N&V, Diarrhea  . Tylenol [Acetaminophen] Other (See Comments)    Can't take because of liver  . Xanax [Alprazolam] Other (See Comments)    Brings the ammonia levels up  . Zolpidem Other (See Comments)    Violent Sleep   Previous Medications   CYCLOBENZAPRINE (FLEXERIL) 5 MG TABLET    Take 1-2 tablets (5-10 mg total) by mouth every 8 (eight) hours as needed. For headache   DIPHENOXYLATE-ATROPINE (LOMOTIL) 2.5-0.025 MG PER TABLET    Take 2 tablets by mouth every 6 (six) hours as needed. For diarrhea   ESCITALOPRAM (LEXAPRO) 10 MG TABLET    Take 1 tablet (10 mg total) by mouth daily.   FUROSEMIDE (LASIX) 20 MG TABLET    Take 20 mg by mouth daily.  HYOSCYAMINE (ANASPAZ) 0.125 MG TBDP DISINTERGRATING TABLET    Place 0.125 mg under the tongue every 4 (four) hours as needed for bladder spasms or cramping.   LACTULOSE (CHRONULAC) 10 GM/15ML SOLUTION    Take 30 mLs by mouth 3 (three) times daily.   MELATONIN (MELATONIN MAXIMUM STRENGTH) 5 MG TABS    Take by mouth.   METOPROLOL TARTRATE (LOPRESSOR) 25 MG TABLET    Take 25 mg by mouth 2 (two) times daily.   NITROGLYCERIN (NITROSTAT) 0.4 MG SL TABLET    Place 0.4 mg under the tongue every 5 (five) minutes as needed for chest pain.   NYSTATIN CREAM (MYCOSTATIN)    Apply 1 application topically 3 (three)  times daily as needed.   OMEPRAZOLE (PRILOSEC) 40 MG CAPSULE    Take 1 capsule (40 mg total) by mouth daily.   ONDANSETRON (ZOFRAN-ODT) 4 MG DISINTEGRATING TABLET    Take 4 mg by mouth every 8 (eight) hours as needed for nausea or vomiting.   OXYCODONE (ROXICODONE) 15 MG IMMEDIATE RELEASE TABLET    Take 2 tablets (30 mg total) by mouth every 3 (three) hours as needed. pain   RIFAXIMIN (XIFAXAN) 550 MG TABS TABLET    Take 550 mg by mouth 2 (two) times daily.   SILDENAFIL (REVATIO) 20 MG TABLET    Take 2-3 tablets by mouth as needed. No more than 3 in a day   SILDENAFIL (VIAGRA) 50 MG TABLET    Take 1 tablet by mouth daily as needed.   SPIRONOLACTONE (ALDACTONE) 50 MG TABLET    Take 50 mg by mouth daily.   TRAZODONE (DESYREL) 50 MG TABLET    Take 50 mg by mouth at bedtime.    Review of Systems  Constitutional: Positive for appetite change (decreased appetite). Negative for fever and chills.  Respiratory: Negative for chest tightness, shortness of breath and wheezing.   Cardiovascular: Negative for chest pain and palpitations.  Gastrointestinal: Negative for nausea, vomiting and abdominal pain.  Psychiatric/Behavioral: Positive for confusion, dysphoric mood, decreased concentration and agitation. The patient is nervous/anxious.     Social History  Substance Use Topics  . Smoking status: Never Smoker   . Smokeless tobacco: Not on file  . Alcohol Use: No   Objective:   BP 124/72 mmHg  Pulse 67  Temp(Src) 98 F (36.7 C) (Oral)  Resp 16  Wt 201 lb (91.173 kg)  SpO2 97%  Physical Exam   General Appearance:    Alert, cooperative, no distress  Eyes:    PERRL, conjunctiva/corneas clear, EOM's intact       Lungs:     Clear to auscultation bilaterally, respirations unlabored  Heart:    Regular rate and rhythm  Neurologic:   Awake, alert, oriented x 3. No apparent focal neurological           defect.           Assessment & Plan:      1. Chronic hepatitis C without hepatic  coma Awaiting results MRI ordered at [facility] to see if candidate for antiviral therapy  2. Chronic pain Stable on current medication regiment  3. Depression, major, recurrent, moderate Not controlled.  - Ambulatory referral to Psychiatry  4. Neuropathy, peripheral, autonomic, idiopathic Recently prescribed gabapentin at [facility] which he has not yet filled. Advised to call me if this is not effective or if any adverse effects.   6. Cirrhosis Continue Follow up [facility]  7. Need for influenza vaccination  - Flu  Vaccine QUAD 36+ mos PF IM (Fluarix & Fluzone Quad PF)       Lelon Huh, MD  [facility]

## 2015-08-31 DIAGNOSIS — B182 Chronic viral hepatitis C: Secondary | ICD-10-CM | POA: Diagnosis not present

## 2015-09-01 DIAGNOSIS — K7460 Unspecified cirrhosis of liver: Secondary | ICD-10-CM | POA: Insufficient documentation

## 2015-09-07 ENCOUNTER — Telehealth: Payer: Self-pay | Admitting: *Deleted

## 2015-09-07 DIAGNOSIS — K219 Gastro-esophageal reflux disease without esophagitis: Secondary | ICD-10-CM

## 2015-09-07 NOTE — Telephone Encounter (Signed)
Received faxed list of approved meds. Placed on providers desk.

## 2015-09-07 NOTE — Telephone Encounter (Addendum)
[facility] called concerning pt's rx for omeprazole 40 mg. Pharmacy stated that the patient's insurance denied the omeprazole 40 mg. However they will approve omeprazole 20 mg twice daily. Also, pharmacy is faxing a list of other medications that they will approve of as substitution for omeprazole 40 mg.

## 2015-09-08 MED ORDER — OMEPRAZOLE 20 MG PO CPDR: 40.0000 mg | DELAYED_RELEASE_CAPSULE | Freq: Every day | ORAL | Status: DC

## 2015-09-08 NOTE — Telephone Encounter (Signed)
Ok. Have new rx to [facility] to take 20mg  two tablet daily

## 2015-09-08 NOTE — Telephone Encounter (Signed)
Left message to call back.  Thanks, 

## 2015-09-11 ENCOUNTER — Ambulatory Visit (INDEPENDENT_AMBULATORY_CARE_PROVIDER_SITE_OTHER): Payer: Medicare Other | Admitting: Family Medicine

## 2015-09-11 ENCOUNTER — Encounter: Payer: Self-pay | Admitting: Family Medicine

## 2015-09-11 VITALS — BP 126/70 | HR 76 | Temp 98.2°F | Resp 16 | Wt 203.0 lb

## 2015-09-11 DIAGNOSIS — F419 Anxiety disorder, unspecified: Secondary | ICD-10-CM

## 2015-09-11 DIAGNOSIS — M5126 Other intervertebral disc displacement, lumbar region: Secondary | ICD-10-CM | POA: Diagnosis not present

## 2015-09-11 DIAGNOSIS — F331 Major depressive disorder, recurrent, moderate: Secondary | ICD-10-CM | POA: Diagnosis not present

## 2015-09-11 DIAGNOSIS — M5020 Other cervical disc displacement, unspecified cervical region: Secondary | ICD-10-CM | POA: Diagnosis not present

## 2015-09-11 MED ORDER — ESCITALOPRAM OXALATE 20 MG PO TABS: 20.0000 mg | ORAL_TABLET | Freq: Every day | ORAL | Status: DC

## 2015-09-11 MED ORDER — OXYCODONE HCL 15 MG PO TABS
30.0000 mg | ORAL_TABLET | ORAL | Status: DC | PRN
Start: 2015-09-11 — End: 2015-10-02

## 2015-09-11 NOTE — Progress Notes (Signed)
Patient: Ray Robles Male    DOB: 14-Aug-1961   54 y.o.   MRN: [id number] Visit Date: 09/11/2015  Today's Provider: Lelon Huh, MD   Chief Complaint  Patient presents with  . Shaking    intermittently x several years   Subjective:    HPI  Patient comes in today complaining of body shaking and jerking episodes. Patient states it has occurred intermittently for several years and is getting worse. It has been much more frequent over the last month and he feels that stress and anxiety may be contributing to it. He is undergoing evaluation at [facility] and was told liver is disease is more advanced than he thought, and is waiting to be screening for [facility] with MRI.      Allergies  Allergen Reactions  . Ambien  [Zolpidem Tartrate]     Violent sleep  . Ibuprofen Other (See Comments)    Can't take because of liver  . Morphine Nausea Only, Nausea And Vomiting and Other (See Comments)    Other reaction(s): Vomiting N&V, Diarrhea  . Morphine And Related Nausea And Vomiting    N&V, Diarrhea  . Tylenol [Acetaminophen] Other (See Comments)    Can't take because of liver  . Xanax [Alprazolam] Other (See Comments)    Brings the ammonia levels up  . Zolpidem Other (See Comments)    Violent Sleep   Previous Medications   CYCLOBENZAPRINE (FLEXERIL) 5 MG TABLET    Take 1-2 tablets (5-10 mg total) by mouth every 8 (eight) hours as needed. For headache   DIPHENOXYLATE-ATROPINE (LOMOTIL) 2.5-0.025 MG PER TABLET    Take 2 tablets by mouth every 6 (six) hours as needed. For diarrhea   ESCITALOPRAM (LEXAPRO) 10 MG TABLET    Take 1 tablet (10 mg total) by mouth daily.   FUROSEMIDE (LASIX) 20 MG TABLET    Take 20 mg by mouth daily.   HYOSCYAMINE (ANASPAZ) 0.125 MG TBDP DISINTERGRATING TABLET    Place 0.125 mg under the tongue every 4 (four) hours as needed for bladder spasms or cramping.   LACTULOSE (CHRONULAC) 10 GM/15ML SOLUTION    Take 30 mLs by mouth 3 (three) times daily.   MELATONIN  (MELATONIN MAXIMUM STRENGTH) 5 MG TABS    Take by mouth.   METOPROLOL TARTRATE (LOPRESSOR) 25 MG TABLET    Take 25 mg by mouth 2 (two) times daily.   NITROGLYCERIN (NITROSTAT) 0.4 MG SL TABLET    Place 0.4 mg under the tongue every 5 (five) minutes as needed for chest pain.   NYSTATIN CREAM (MYCOSTATIN)    Apply 1 application topically 3 (three) times daily as needed.   OMEPRAZOLE (PRILOSEC) 20 MG CAPSULE    Take 2 capsules (40 mg total) by mouth daily.   ONDANSETRON (ZOFRAN-ODT) 4 MG DISINTEGRATING TABLET    Take 4 mg by mouth every 8 (eight) hours as needed for nausea or vomiting.   OXYCODONE (ROXICODONE) 15 MG IMMEDIATE RELEASE TABLET    Take 2 tablets (30 mg total) by mouth every 3 (three) hours as needed. pain   RIFAXIMIN (XIFAXAN) 550 MG TABS TABLET    Take 550 mg by mouth 2 (two) times daily.   SILDENAFIL (REVATIO) 20 MG TABLET    Take 2-3 tablets by mouth as needed. No more than 3 in a day   SILDENAFIL (VIAGRA) 50 MG TABLET    Take 1 tablet by mouth daily as needed.   SPIRONOLACTONE (ALDACTONE) 50 MG TABLET  Take 50 mg by mouth daily.   TRAZODONE (DESYREL) 50 MG TABLET    Take 50 mg by mouth at bedtime.    Review of Systems  Constitutional: Negative for fever, chills and appetite change.  Respiratory: Negative for chest tightness, shortness of breath and wheezing.   Cardiovascular: Negative for chest pain and palpitations.  Gastrointestinal: Negative for nausea, vomiting and abdominal pain.  Neurological: Positive for tremors and weakness. Negative for dizziness, seizures, syncope, facial asymmetry, speech difficulty, light-headedness, numbness and headaches.  Psychiatric/Behavioral: Positive for dysphoric mood and decreased concentration.    Social History  Substance Use Topics  . Smoking status: Never Smoker   . Smokeless tobacco: Not on file  . Alcohol Use: No   Objective:   BP 126/70 mmHg  Pulse 76  Temp(Src) 98.2 F (36.8 C) (Oral)  Resp 16  Wt 203 lb (92.08 kg)   SpO2 97%  Physical Exam  General Appearance:    Alert, cooperative, no distress, obese  Eyes:    PERRL, conjunctiva/corneas clear, EOM's intact       Lungs:     Clear to auscultation bilaterally, respirations unlabored  Heart:    Regular rate and rhythm  Neurologic:   Awake, alert, oriented x 3. No apparent focal neurological           defect. No focal defects. No current tremor.        Depression screen PHQ 2/9 09/11/2015  Decreased Interest 0  Down, Depressed, Hopeless 3  PHQ - 2 Score 3  Altered sleeping 3  Tired, decreased energy 3  Change in appetite 3  Feeling bad or failure about yourself  1  Trouble concentrating 3  Moving slowly or fidgety/restless 0  Suicidal thoughts 0  PHQ-9 Score 16  Difficult doing work/chores Not difficult at all       Assessment & Plan:     1. Depression, major, recurrent, moderate ([place]) Worsening due to concerns about progressive liver disease.  - escitalopram (LEXAPRO) 20 MG tablet; Take 1 tablet (20 mg total) by mouth daily.  Dispense: 30 tablet; Refill: 6  2. Anxiety Worsening, currently off of benzos due to liver disease. Expect improvement with increase dose of SSRI above. Jerking episodes are likely psychogenic secondary to anxiety.   3. Lumbar disc herniation   4. Herniated cervical disc Pain relatively well controlled on current medications.  - oxyCODONE (ROXICODONE) 15 MG immediate release tablet; Take 2 tablets (30 mg total) by mouth every 3 (three) hours as needed. pain  Dispense: 300 tablet; Refill: 0  Return in about 4 weeks (around 10/09/2015).      Lelon Huh, MD  [facility]

## 2015-09-12 NOTE — Telephone Encounter (Signed)
Advised pharmacy that patient is to take 20mg  two tablets daily.

## 2015-09-29 ENCOUNTER — Other Ambulatory Visit: Payer: Self-pay | Admitting: Family Medicine

## 2015-09-29 DIAGNOSIS — M5020 Other cervical disc displacement, unspecified cervical region: Secondary | ICD-10-CM

## 2015-09-29 NOTE — Telephone Encounter (Signed)
Pt needs refill oxyCODONE (ROXICODONE) 15 MG immediate release tablet 09/11/15 -  Thanks teri

## 2015-10-02 MED ORDER — OXYCODONE HCL 15 MG PO TABS: 30.0000 mg | ORAL_TABLET | ORAL | Status: DC | PRN

## 2015-10-02 NOTE — Telephone Encounter (Signed)
Pt called to see if RX was ready. Pt stated he is out and would like to get it today if possible. Thanks TNP

## 2015-10-12 DIAGNOSIS — B182 Chronic viral hepatitis C: Secondary | ICD-10-CM | POA: Diagnosis not present

## 2015-10-20 ENCOUNTER — Other Ambulatory Visit: Payer: Self-pay | Admitting: Family Medicine

## 2015-10-20 DIAGNOSIS — M5020 Other cervical disc displacement, unspecified cervical region: Secondary | ICD-10-CM

## 2015-10-20 NOTE — Telephone Encounter (Signed)
needs refill oxyCODONE (ROXICODONE) 15 MG immediate release tablet 10/02/15 -- Birdie Sons, MD Take 2 tablets (30 mg total) by mouth every 3 (three) hours as needed.   ThanksTeri

## 2015-10-20 NOTE — Telephone Encounter (Signed)
Last filled on 10/02/15 #300 and 0 refills. Patient was last seen on 09/11/15.

## 2015-10-23 MED ORDER — OXYCODONE HCL 15 MG PO TABS: 30.0000 mg | ORAL_TABLET | ORAL | Status: DC | PRN

## 2015-11-06 DIAGNOSIS — K7460 Unspecified cirrhosis of liver: Secondary | ICD-10-CM | POA: Diagnosis not present

## 2015-11-06 DIAGNOSIS — G43A1 Cyclical vomiting, intractable: Secondary | ICD-10-CM | POA: Diagnosis not present

## 2015-11-10 ENCOUNTER — Encounter: Payer: Self-pay | Admitting: Family Medicine

## 2015-11-10 ENCOUNTER — Ambulatory Visit (INDEPENDENT_AMBULATORY_CARE_PROVIDER_SITE_OTHER): Payer: Medicare Other | Admitting: Family Medicine

## 2015-11-10 VITALS — BP 122/72 | HR 100 | Temp 98.8°F | Resp 16 | Ht 70.5 in | Wt 198.0 lb

## 2015-11-10 DIAGNOSIS — M7062 Trochanteric bursitis, left hip: Secondary | ICD-10-CM | POA: Diagnosis not present

## 2015-11-10 DIAGNOSIS — M5020 Other cervical disc displacement, unspecified cervical region: Secondary | ICD-10-CM

## 2015-11-10 MED ORDER — METHYLPREDNISOLONE ACETATE 80 MG/ML IJ SUSP: 80.0000 mg | Freq: Once | INTRAMUSCULAR | Status: DC

## 2015-11-10 MED ORDER — OXYCODONE HCL 15 MG PO TABS: 30.0000 mg | ORAL_TABLET | ORAL | Status: DC | PRN

## 2015-11-10 NOTE — Progress Notes (Signed)
Patient: Ray Robles Male    DOB: 04-21-1961   54 y.o.   MRN: [id number] Visit Date: 11/10/2015  Today's Provider: Lelon Huh, MD   Chief Complaint  Patient presents with  . Hip Pain  . Anxiety  . Nausea   Subjective:    HPI  Patient was seen for Trochanteric bursitis of left hip on 08/21/2015; was given depo-medrol injection in left hip which he states worked very well.        Patient: Ray Robles Male    DOB: 03-07-1961   54 y.o.   MRN: [id number] Visit Date: 11/23/2015  Today's Provider: Lelon Huh, MD   Chief Complaint  Patient presents with  . Hip Pain  . Anxiety  . Nausea   Subjective:    HPI  Hip pain Complains of persistent pain left hip for the last few months. Was seen for similar pain in his right hip on 07/31/2015  He was treated with inection of depo-medrol right greater trochanter. He states pain gradually resolved over the course of the next week and has not returned. He requests same injection for his left hip  Chronic pain He states oxycodone continues to provide adequate pain relief and is need of printed presciption.    Allergies  Allergen Reactions  . Ambien  [Zolpidem Tartrate]     Violent sleep  . Ibuprofen Other (See Comments)    Can't take because of liver  . Morphine Nausea Only, Nausea And Vomiting and Other (See Comments)    Other reaction(s): Vomiting N&V, Diarrhea  . Morphine And Related Nausea And Vomiting    N&V, Diarrhea  . Tylenol [Acetaminophen] Other (See Comments)    Can't take because of liver  . Xanax [Alprazolam] Other (See Comments)    Brings the ammonia levels up  . Zolpidem Other (See Comments)    Violent Sleep   Previous Medications   CYCLOBENZAPRINE (FLEXERIL) 5 MG TABLET    Take 1-2 tablets (5-10 mg total) by mouth every 8 (eight) hours as needed. For headache   DIPHENOXYLATE-ATROPINE (LOMOTIL) 2.5-0.025 MG PER TABLET    Take 2 tablets by mouth every 6 (six) hours as needed. For diarrhea   ESCITALOPRAM (LEXAPRO) 20 MG TABLET    Take 1 tablet (20 mg total) by mouth daily.   FUROSEMIDE (LASIX) 20 MG TABLET    Take 20 mg by mouth daily.   HYOSCYAMINE (ANASPAZ) 0.125 MG TBDP DISINTERGRATING TABLET    Place 0.125 mg under the tongue every 4 (four) hours as needed for bladder spasms or cramping.   LACTULOSE (CHRONULAC) 10 GM/15ML SOLUTION    Take 30 mLs by mouth 3 (three) times daily.   MELATONIN (MELATONIN MAXIMUM STRENGTH) 5 MG TABS    Take by mouth.   METOPROLOL TARTRATE (LOPRESSOR) 25 MG TABLET    Take 25 mg by mouth 2 (two) times daily.   NITROGLYCERIN (NITROSTAT) 0.4 MG SL TABLET    Place 0.4 mg under the tongue every 5 (five) minutes as needed for chest pain.   NYSTATIN CREAM (MYCOSTATIN)    Apply 1 application topically 3 (three) times daily as needed.   OMEPRAZOLE (PRILOSEC) 20 MG CAPSULE    Take 2 capsules (40 mg total) by mouth daily.   ONDANSETRON (ZOFRAN-ODT) 4 MG DISINTEGRATING TABLET    Take 4 mg by mouth every 8 (eight) hours as needed for nausea or vomiting.   RIFAXIMIN (XIFAXAN) 550 MG TABS TABLET    Take 550  mg by mouth 2 (two) times daily.   SILDENAFIL (REVATIO) 20 MG TABLET    Take 2-3 tablets by mouth as needed. No more than 3 in a day   SILDENAFIL (VIAGRA) 50 MG TABLET    Take 1 tablet by mouth daily as needed.   SPIRONOLACTONE (ALDACTONE) 50 MG TABLET    Take 50 mg by mouth daily.   TRAZODONE (DESYREL) 50 MG TABLET    Take 50 mg by mouth at bedtime.    Review of Systems  Cardiovascular: Negative.   Neurological: Positive for headaches. Negative for dizziness and light-headedness.    Social History  Substance Use Topics  . Smoking status: Never Smoker   . Smokeless tobacco: Not on file  . Alcohol Use: No   Objective:   BP 122/72 mmHg  Pulse 100  Temp(Src) 98.8 F (37.1 C) (Oral)  Resp 16  Ht 5' 10.5" (1.791 m)  Wt 198 lb (89.812 kg)  BMI 28.00 kg/m2  SpO2 98%  Physical Exam   General Appearance:    Alert, cooperative, no distress  Eyes:     PERRL, conjunctiva/corneas clear, EOM's intact       Lungs:     Clear to auscultation bilaterally, respirations unlabored  Heart:    Regular rate and rhythm  Neurologic:   Awake, alert, oriented x 3. No apparent focal neurological           defect.   MS:    Very tender over left greater trochanter. Mild tenderness of left greater trochanter. No swelling and no erythema   Diffuse tenderness of lumbar and cervical spine. Normal muscle strength.       Assessment & Plan:     1. Herniated cervical disc Stable on current dose of oxycodone - oxyCODONE (ROXICODONE) 15 MG immediate release tablet; Take 2 tablets (30 mg total) by mouth every 3 (three) hours as needed. pain  Dispense: 300 tablet; Refill: 0  2. Trochanteric bursitis of left hip Prepped skin over left greater trochanter with isopropyl alcohol. Injected 28ml methylprenisolone over left greater trochanter. Covered injection site with adhesive bandage. Patient tolerated procedure well.  - methylPREDNISolone acetate (DEPO-MEDROL) injection 80 mg; 1 mL (80 mg total) by Other route once.      Lelon Huh, MD  [facility]. He has same pain now in right hip and would like injections.  Has been having nausea, headaches and nervousness. Believes this is due to new medications-epclusa and rifaximin. Medications were prescribed by Dr Drue Novel at [facility].     Allergies  Allergen Reactions  . Ambien  [Zolpidem Tartrate]     Violent sleep  . Ibuprofen Other (See Comments)    Can't take because of liver  . Morphine Nausea Only, Nausea And Vomiting and Other (See Comments)    Other reaction(s): Vomiting N&V, Diarrhea  . Morphine And Related Nausea And Vomiting    N&V, Diarrhea  . Tylenol [Acetaminophen] Other (See Comments)    Can't take because of liver  . Xanax [Alprazolam] Other (See Comments)    Brings the ammonia levels up  . Zolpidem Other (See Comments)    Violent Sleep   Previous  Medications   CYCLOBENZAPRINE (FLEXERIL) 5 MG TABLET    Take 1-2 tablets (5-10 mg total) by mouth every 8 (eight) hours as needed. For headache   DIPHENOXYLATE-ATROPINE (LOMOTIL) 2.5-0.025 MG PER TABLET    Take 2 tablets by mouth every 6 (six) hours as needed. For diarrhea  ESCITALOPRAM (LEXAPRO) 20 MG TABLET    Take 1 tablet (20 mg total) by mouth daily.   FUROSEMIDE (LASIX) 20 MG TABLET    Take 20 mg by mouth daily.   HYOSCYAMINE (ANASPAZ) 0.125 MG TBDP DISINTERGRATING TABLET    Place 0.125 mg under the tongue every 4 (four) hours as needed for bladder spasms or cramping.   LACTULOSE (CHRONULAC) 10 GM/15ML SOLUTION    Take 30 mLs by mouth 3 (three) times daily.   MELATONIN (MELATONIN MAXIMUM STRENGTH) 5 MG TABS    Take by mouth.   METOPROLOL TARTRATE (LOPRESSOR) 25 MG TABLET    Take 25 mg by mouth 2 (two) times daily.   NITROGLYCERIN (NITROSTAT) 0.4 MG SL TABLET    Place 0.4 mg under the tongue every 5 (five) minutes as needed for chest pain.   NYSTATIN CREAM (MYCOSTATIN)    Apply 1 application topically 3 (three) times daily as needed.   OMEPRAZOLE (PRILOSEC) 20 MG CAPSULE    Take 2 capsules (40 mg total) by mouth daily.   ONDANSETRON (ZOFRAN-ODT) 4 MG DISINTEGRATING TABLET    Take 4 mg by mouth every 8 (eight) hours as needed for nausea or vomiting.   OXYCODONE (ROXICODONE) 15 MG IMMEDIATE RELEASE TABLET    Take 2 tablets (30 mg total) by mouth every 3 (three) hours as needed. pain   RIFAXIMIN (XIFAXAN) 550 MG TABS TABLET    Take 550 mg by mouth 2 (two) times daily.   SILDENAFIL (REVATIO) 20 MG TABLET    Take 2-3 tablets by mouth as needed. No more than 3 in a day   SILDENAFIL (VIAGRA) 50 MG TABLET    Take 1 tablet by mouth daily as needed.   SPIRONOLACTONE (ALDACTONE) 50 MG TABLET    Take 50 mg by mouth daily.   TRAZODONE (DESYREL) 50 MG TABLET    Take 50 mg by mouth at bedtime.    Review of Systems  Constitutional: Negative for fever, chills and appetite change.  Respiratory: Negative  for chest tightness, shortness of breath and wheezing.   Cardiovascular: Negative for chest pain and palpitations.  Gastrointestinal: Positive for nausea. Negative for vomiting and abdominal pain.  Neurological: Positive for headaches.  Psychiatric/Behavioral: Positive for agitation. The patient is nervous/anxious.     Social History  Substance Use Topics  . Smoking status: Never Smoker   . Smokeless tobacco: Not on file  . Alcohol Use: No   Objective:   BP 122/72 mmHg  Pulse 100  Temp(Src) 98.8 F (37.1 C) (Oral)  Resp 16  Ht 5' 10.5" (1.791 m)  Wt 198 lb (89.812 kg)  BMI 28.00 kg/m2  SpO2 98%  Physical Exam  General appearance: alert, well developed, well nourished, cooperative and in no distress Head: Normocephalic, without obvious abnormality, atraumatic Lungs: Respirations even and unlabored Moderate point tenderness over right greater trochanter Neurologic: Mental status: Alert, oriented to person, place, and time, thought content appropriate.     Assessment & Plan:     1. Trochanteric bursitis of right hip Prepped skin over right greater trochanter with isopropyl alcohol. Injected 12ml methylprenisolone over left greater trochanter. Covered injection site with adhesive bandage. Patient tolerated procedure well.  - methylPREDNISolone acetate (DEPO-MEDROL) injection 80 mg; 1 mL (80 mg total) by Other route once.  2. Herniated cervical disc Stable on current medication regiment.  - oxyCODONE (ROXICODONE) 15 MG immediate release tablet; Take 2 tablets (30 mg total) by mouth every 3 (three) hours as needed. pain  Dispense:  300 tablet; Refill: 0  He is likely having some mild adverse reactions to his antiviral and is to discuss this with his gastroenterologist at upcoming appointment.       Lelon Huh, MD  [facility]

## 2015-11-24 ENCOUNTER — Other Ambulatory Visit: Payer: Self-pay | Admitting: *Deleted

## 2015-11-24 DIAGNOSIS — M5020 Other cervical disc displacement, unspecified cervical region: Secondary | ICD-10-CM

## 2015-11-29 ENCOUNTER — Telehealth: Payer: Self-pay | Admitting: Family Medicine

## 2015-11-29 NOTE — Telephone Encounter (Signed)
Pt contacted office for refill request on the following medications:  oxyCODONE (ROXICODONE) 15 MG immediate release tablet.  [id number]

## 2015-11-30 MED ORDER — OXYCODONE HCL 15 MG PO TABS: 30.0000 mg | ORAL_TABLET | ORAL | Status: DC | PRN

## 2015-11-30 NOTE — Addendum Note (Signed)
Addended by: Julieta Bellini on: 11/30/2015 02:48 PM   Modules accepted: Orders

## 2015-12-07 ENCOUNTER — Ambulatory Visit: Payer: Medicare Other | Admitting: Psychiatry

## 2015-12-19 ENCOUNTER — Other Ambulatory Visit: Payer: Self-pay | Admitting: Family Medicine

## 2015-12-19 DIAGNOSIS — M5020 Other cervical disc displacement, unspecified cervical region: Secondary | ICD-10-CM

## 2015-12-19 MED ORDER — OXYCODONE HCL 15 MG PO TABS
30.0000 mg | ORAL_TABLET | ORAL | Status: DC | PRN
Start: 2015-12-19 — End: 2016-01-09

## 2015-12-19 NOTE — Telephone Encounter (Signed)
Please review. Thanks!  

## 2015-12-19 NOTE — Telephone Encounter (Signed)
Pt contacted office for refill request on the following medications: oxyCODONE (ROXICODONE) 15 MG immediate release tablet. Pt stated he will be near the office tomorrow and would like to pick it up then. Last written on 11/30/15. Thanks TNP

## 2015-12-20 DIAGNOSIS — K7460 Unspecified cirrhosis of liver: Secondary | ICD-10-CM | POA: Diagnosis not present

## 2015-12-20 DIAGNOSIS — K766 Portal hypertension: Secondary | ICD-10-CM | POA: Diagnosis not present

## 2015-12-20 DIAGNOSIS — K7469 Other cirrhosis of liver: Secondary | ICD-10-CM | POA: Diagnosis not present

## 2015-12-20 DIAGNOSIS — R161 Splenomegaly, not elsewhere classified: Secondary | ICD-10-CM | POA: Diagnosis not present

## 2015-12-25 DIAGNOSIS — Z79899 Other long term (current) drug therapy: Secondary | ICD-10-CM | POA: Diagnosis not present

## 2015-12-25 DIAGNOSIS — K7460 Unspecified cirrhosis of liver: Secondary | ICD-10-CM | POA: Diagnosis not present

## 2015-12-25 DIAGNOSIS — B182 Chronic viral hepatitis C: Secondary | ICD-10-CM | POA: Diagnosis not present

## 2016-01-09 ENCOUNTER — Encounter: Payer: Self-pay | Admitting: Family Medicine

## 2016-01-09 ENCOUNTER — Ambulatory Visit (INDEPENDENT_AMBULATORY_CARE_PROVIDER_SITE_OTHER): Payer: Medicare Other | Admitting: Family Medicine

## 2016-01-09 VITALS — BP 142/82 | HR 86 | Temp 97.8°F | Resp 16 | Wt 183.0 lb

## 2016-01-09 DIAGNOSIS — M5126 Other intervertebral disc displacement, lumbar region: Secondary | ICD-10-CM

## 2016-01-09 DIAGNOSIS — M5020 Other cervical disc displacement, unspecified cervical region: Secondary | ICD-10-CM | POA: Diagnosis not present

## 2016-01-09 DIAGNOSIS — F331 Major depressive disorder, recurrent, moderate: Secondary | ICD-10-CM | POA: Diagnosis not present

## 2016-01-09 DIAGNOSIS — K7460 Unspecified cirrhosis of liver: Secondary | ICD-10-CM

## 2016-01-09 DIAGNOSIS — B182 Chronic viral hepatitis C: Secondary | ICD-10-CM | POA: Diagnosis not present

## 2016-01-09 MED ORDER — BUPROPION HCL ER (XL) 150 MG PO TB24: 150.0000 mg | ORAL_TABLET | Freq: Every day | ORAL | Status: DC

## 2016-01-09 MED ORDER — OXYCODONE HCL 15 MG PO TABS: 30.0000 mg | ORAL_TABLET | ORAL | Status: DC | PRN

## 2016-01-09 NOTE — Progress Notes (Signed)
Patient: Ray Robles Male    DOB: Mar 10, 1961   55 y.o.   MRN: [id number] Visit Date: 01/09/2016  Today's Provider: Lelon Huh, MD   Chief Complaint  Patient presents with  . Cirrhosis    follow up   Subjective:    HPI  Follow up Cirrhosis of Liver: Patient comes in today requesting follow up consultation after having MRI of abdomen. He completed 12 week course Epcuse+Ribavirin at [facility]. He had MRI done 12/20/2015 and states he has not yet been advised or results. Discussed findings on MRI report which were sequela of cirrhosis. He had follow up at liver Clinic 1/23 with negative hep c titers, and is to follow up in 12 more weeks.   Depression He feels like lexapro is not adequately controlling depression. He is tolerating without adverse effects. Denies SI. Still has trouble sleeping.     Allergies  Allergen Reactions  . Ambien  [Zolpidem Tartrate]     Violent sleep  . Ibuprofen Other (See Comments)    Can't take because of liver  . Morphine Nausea Only, Nausea And Vomiting and Other (See Comments)    Other reaction(s): Vomiting N&V, Diarrhea  . Morphine And Related Nausea And Vomiting    N&V, Diarrhea  . Tylenol [Acetaminophen] Other (See Comments)    Can't take because of liver  . Xanax [Alprazolam] Other (See Comments)    Brings the ammonia levels up  . Zolpidem Other (See Comments)    Violent Sleep   Previous Medications   CYCLOBENZAPRINE (FLEXERIL) 5 MG TABLET    Take 1-2 tablets (5-10 mg total) by mouth every 8 (eight) hours as needed. For headache   DIPHENOXYLATE-ATROPINE (LOMOTIL) 2.5-0.025 MG PER TABLET    Take 2 tablets by mouth every 6 (six) hours as needed. For diarrhea   ESCITALOPRAM (LEXAPRO) 20 MG TABLET    Take 1 tablet (20 mg total) by mouth daily.   FUROSEMIDE (LASIX) 20 MG TABLET    Take 20 mg by mouth daily.   HYOSCYAMINE (ANASPAZ) 0.125 MG TBDP DISINTERGRATING TABLET    Place 0.125 mg under the tongue every 4 (four) hours as needed for  bladder spasms or cramping.   LACTULOSE (CHRONULAC) 10 GM/15ML SOLUTION    Take 30 mLs by mouth 3 (three) times daily.   MELATONIN (MELATONIN MAXIMUM STRENGTH) 5 MG TABS    Take by mouth.   METOPROLOL TARTRATE (LOPRESSOR) 25 MG TABLET    Take 25 mg by mouth 2 (two) times daily.   NITROGLYCERIN (NITROSTAT) 0.4 MG SL TABLET    Place 0.4 mg under the tongue every 5 (five) minutes as needed for chest pain.   NYSTATIN CREAM (MYCOSTATIN)    Apply 1 application topically 3 (three) times daily as needed.   OMEPRAZOLE (PRILOSEC) 20 MG CAPSULE    Take 2 capsules (40 mg total) by mouth daily.   ONDANSETRON (ZOFRAN-ODT) 4 MG DISINTEGRATING TABLET    Take 4 mg by mouth every 8 (eight) hours as needed for nausea or vomiting.   OXYCODONE (ROXICODONE) 15 MG IMMEDIATE RELEASE TABLET    Take 2 tablets (30 mg total) by mouth every 3 (three) hours as needed. pain   SILDENAFIL (REVATIO) 20 MG TABLET    Take 2-3 tablets by mouth as needed. No more than 3 in a day   SILDENAFIL (VIAGRA) 50 MG TABLET    Take 1 tablet by mouth daily as needed.   SPIRONOLACTONE (ALDACTONE) 50 MG TABLET  Take 50 mg by mouth daily.   TRAZODONE (DESYREL) 50 MG TABLET    Take 50 mg by mouth at bedtime.    Review of Systems  Constitutional: Positive for diaphoresis and unexpected weight change (abnormal weight loss). Negative for fever, chills and appetite change.  Respiratory: Negative for chest tightness, shortness of breath and wheezing.   Cardiovascular: Negative for chest pain and palpitations.  Gastrointestinal: Positive for nausea and abdominal pain. Negative for vomiting.  Neurological: Positive for dizziness and light-headedness.    Social History  Substance Use Topics  . Smoking status: Never Smoker   . Smokeless tobacco: Not on file  . Alcohol Use: No   Objective:   BP 142/82 mmHg  Pulse 86  Temp(Src) 97.8 F (36.6 C) (Oral)  Resp 16  Wt 183 lb (83.008 kg)  SpO2 98%  Physical Exam  General appearance: alert,  well developed, well nourished, cooperative and in no distress Head: Normocephalic, without obvious abnormality, atraumatic Lungs: Respirations even and unlabored Extremities: No gross deformities Skin: Skin color, texture, turgor normal. No rashes seen  Psych: Appropriate mood and affect. Neurologic: Mental status: Alert, oriented to person, place, and time, thought content appropriate.     Assessment & Plan:     1. Cirrhosis, non-alcoholic ([place]) Discussed results of recent MRI and is to follow up with Liver clinic at [facility] in [place]  2. Herniated cervical disc Pain adequately controlled on current dose of oxycodone which was refilled today.  - oxyCODONE (ROXICODONE) 15 MG immediate release tablet; Take 2 tablets (30 mg total) by mouth every 3 (three) hours as needed. pain  Dispense: 300 tablet; Refill: 0  3. Lumbar disc herniation   4. Depression, major, recurrent, moderate (H[facility]) Tolerating Lexapro, but not adequately controlled.Will add bupropion - buPROPion (WELLBUTRIN XL) 150 MG 24 hr tablet; Take 1 tablet (150 mg total) by mouth daily.  Dispense: 30 tablet; Refill: 2       Lelon Huh, MD  [facility]

## 2016-01-18 ENCOUNTER — Encounter: Payer: Self-pay | Admitting: Psychiatry

## 2016-01-18 ENCOUNTER — Ambulatory Visit (INDEPENDENT_AMBULATORY_CARE_PROVIDER_SITE_OTHER): Payer: Medicare Other | Admitting: Psychiatry

## 2016-01-18 VITALS — BP 142/82 | HR 74 | Temp 97.3°F | Ht 70.5 in | Wt 185.6 lb

## 2016-01-18 DIAGNOSIS — F331 Major depressive disorder, recurrent, moderate: Secondary | ICD-10-CM | POA: Diagnosis not present

## 2016-01-18 MED ORDER — LITHIUM CARBONATE ER 300 MG PO TBCR: 300.0000 mg | EXTENDED_RELEASE_TABLET | Freq: Every day | ORAL | Status: DC

## 2016-01-18 MED ORDER — PRAZOSIN HCL 1 MG PO CAPS: 1.0000 mg | ORAL_CAPSULE | Freq: Two times a day (BID) | ORAL | Status: DC
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
## 2016-01-19 ENCOUNTER — Telehealth: Payer: Self-pay

## 2016-01-19 NOTE — Telephone Encounter (Signed)
maggie from [facility] called because you gave patient a rx for lithium and it interacts with 3 of his medications that he is on.  they want to make sure that you want to still give to patient.

## 2016-01-22 NOTE — Telephone Encounter (Signed)
broke from [facility] called states that they needs to speak to doctor in regards to a drug interaction on the lithium.

## 2016-01-22 NOTE — Telephone Encounter (Signed)
Spoke with Brook at [facility]. She was discussing interactions with Lexapro, Zofran and Oxy . I acknowledged then and advised her to continue with Lithium prescription.

## 2016-01-29 ENCOUNTER — Ambulatory Visit (INDEPENDENT_AMBULATORY_CARE_PROVIDER_SITE_OTHER): Payer: Medicare Other | Admitting: Family Medicine

## 2016-01-29 ENCOUNTER — Encounter: Payer: Self-pay | Admitting: Family Medicine

## 2016-01-29 VITALS — BP 110/60 | HR 60 | Temp 97.9°F | Resp 18 | Wt 188.0 lb

## 2016-01-29 DIAGNOSIS — L570 Actinic keratosis: Secondary | ICD-10-CM | POA: Diagnosis not present

## 2016-01-29 DIAGNOSIS — S0101XA Laceration without foreign body of scalp, initial encounter: Secondary | ICD-10-CM | POA: Diagnosis not present

## 2016-01-29 DIAGNOSIS — R55 Syncope and collapse: Secondary | ICD-10-CM

## 2016-01-29 DIAGNOSIS — M5020 Other cervical disc displacement, unspecified cervical region: Secondary | ICD-10-CM

## 2016-01-29 MED ORDER — OXYCODONE HCL 15 MG PO TABS: 30.0000 mg | ORAL_TABLET | ORAL | Status: DC | PRN

## 2016-01-29 NOTE — Progress Notes (Signed)
Patient: Ray Robles Male    DOB: [birth year]   55 y.o.   MRN: [id number] Visit Date: 01/29/2016  Today's Provider: Lelon Huh, MD   Chief Complaint  Patient presents with  . Fall   Subjective:    Fall Incident onset: 3 days ago. The fall occurred while standing (patient states he lost conciousness hitting his head on a rock). Impact surface: on rocks. The point of impact was the head and face. The pain is present in the head and neck. Associated symptoms include headaches, a loss of consciousness and nausea. Pertinent negatives include no abdominal pain, bowel incontinence, fever, hearing loss, hematuria, numbness, tingling, visual change or vomiting. Treatments tried: stopping new medication recently prescribed. The treatment provided significant relief.   He reports that his psychiatrist changed his trazodone to prazocin about 10 days ago for insomnia due to severe reaction to trazodone.  He was taking it at night and in the morning for about 4 days and had been feeling dizzy and weak in his legs, until he passed out and fell 2 days ago. He was also started on lithium for PTSD, but stopped both medication after he passed out. He has not felt dizzy or weak in legs since stopping medication. He has follow up with psychiatrist in 2 week.[name] He does have a few small, shallow scalp laceration, but no other new neurological symptoms since falling.     Allergies  Allergen Reactions  . Ambien  [Zolpidem Tartrate]     Violent sleep  . Ibuprofen Other (See Comments)    Can't take because of liver  . Morphine Nausea Only, Nausea And Vomiting and Other (See Comments)    Other reaction(s): Vomiting N&V, Diarrhea  . Morphine And Related Nausea And Vomiting    N&V, Diarrhea  . Tylenol [Acetaminophen] Other (See Comments)    Can't take because of liver  . Xanax [Alprazolam] Other (See Comments)    Brings the ammonia levels up  . Zolpidem Other (See Comments)    Violent Sleep    Previous Medications   CYCLOBENZAPRINE (FLEXERIL) 5 MG TABLET    Take 1-2 tablets (5-10 mg total) by mouth every 8 (eight) hours as needed. For headache   DIPHENOXYLATE-ATROPINE (LOMOTIL) 2.5-0.025 MG PER TABLET    Take 2 tablets by mouth every 6 (six) hours as needed. For diarrhea   ESCITALOPRAM (LEXAPRO) 20 MG TABLET    Take 1 tablet (20 mg total) by mouth daily.   FUROSEMIDE (LASIX) 20 MG TABLET    Take 20 mg by mouth daily.   GABAPENTIN (NEURONTIN) 300 MG CAPSULE    Take 300 mg by mouth.   HYOSCYAMINE (ANASPAZ) 0.125 MG TBDP DISINTERGRATING TABLET    Place 0.125 mg under the tongue every 4 (four) hours as needed for bladder spasms or cramping.   LACTULOSE (CHRONULAC) 10 GM/15ML SOLUTION    Take 30 mLs by mouth 3 (three) times daily.   LITHIUM CARBONATE (LITHOBID) 300 MG CR TABLET    Take 1 tablet (300 mg total) by mouth at bedtime.   MELATONIN (MELATONIN MAXIMUM STRENGTH) 5 MG TABS    Take by mouth.   METOPROLOL TARTRATE (LOPRESSOR) 25 MG TABLET    Take 25 mg by mouth 2 (two) times daily.   NITROGLYCERIN (NITROSTAT) 0.4 MG SL TABLET    Place 0.4 mg under the tongue every 5 (five) minutes as needed for chest pain.   NYSTATIN CREAM (MYCOSTATIN)    Apply 1 application  topically 3 (three) times daily as needed.   OMEPRAZOLE (PRILOSEC) 20 MG CAPSULE    Take 2 capsules (40 mg total) by mouth daily.   ONDANSETRON (ZOFRAN-ODT) 4 MG DISINTEGRATING TABLET    Take 4 mg by mouth every 8 (eight) hours as needed for nausea or vomiting.   OXYCODONE (ROXICODONE) 15 MG IMMEDIATE RELEASE TABLET    Take 2 tablets (30 mg total) by mouth every 3 (three) hours as needed. pain   POLYETHYLENE GLYCOL-ELECTROLYTES (NULYTELY/GOLYTELY) 420 G SOLUTION       PRAZOSIN (MINIPRESS) 1 MG CAPSULE    Take 1 capsule (1 mg total) by mouth 2 (two) times daily.   SILDENAFIL (REVATIO) 20 MG TABLET    Take 2-3 tablets by mouth as needed. No more than 3 in a day   SPIRONOLACTONE (ALDACTONE) 50 MG TABLET    Take 50 mg by mouth  daily.    Review of Systems  Constitutional: Negative for fever, chills and appetite change.  Respiratory: Negative for chest tightness, shortness of breath and wheezing.   Cardiovascular: Negative for chest pain and palpitations.  Gastrointestinal: Positive for nausea. Negative for vomiting, abdominal pain and bowel incontinence.  Genitourinary: Negative for hematuria.  Neurological: Positive for dizziness, loss of consciousness, light-headedness and headaches. Negative for tingling and numbness.    Social History  Substance Use Topics  . Smoking status: Never Smoker   . Smokeless tobacco: Never Used  . Alcohol Use: No   Objective:   BP 110/60 mmHg  Pulse 60  Temp(Src) 97.9 F (36.6 C) (Oral)  Resp 18  Wt 188 lb (85.276 kg)  Physical Exam  General appearance: alert, well developed, well nourished, cooperative and in no distress Head: Normocephalic, without obvious abnormality, atraumatic Lungs: Respirations even and unlabored Extremities: No gross deformities Skin: Three 37mm wide healing lacerating across front and top of scalp. Several scaly lesions c/w A.K. On scalp. Psych: Appropriate mood and affect. Neurologic: Mental status: Alert, oriented to person, place, and time, thought content appropriate.     Assessment & Plan:     1. Syncope, non cardiac Advised not to take the prazosin during the day as it was primarily intended to be used for insomnia.   2. Scalp laceration, initial encounter Healing well with no sign of infection  3. Herniated cervical disc Refill pain medications - oxyCODONE (ROXICODONE) 15 MG immediate release tablet; Take 2 tablets (30 mg total) by mouth every 3 (three) hours as needed. pain  Dispense: 300 tablet; Refill: 0  4. Actinic keratosis (scalp)  - Ambulatory referral to Dermatology        Lelon Huh, MD  [facility]

## 2016-02-07 DIAGNOSIS — L570 Actinic keratosis: Secondary | ICD-10-CM | POA: Diagnosis not present

## 2016-02-07 DIAGNOSIS — D485 Neoplasm of uncertain behavior of skin: Secondary | ICD-10-CM | POA: Diagnosis not present

## 2016-02-07 DIAGNOSIS — L578 Other skin changes due to chronic exposure to nonionizing radiation: Secondary | ICD-10-CM | POA: Diagnosis not present

## 2016-02-13 ENCOUNTER — Encounter: Payer: Self-pay | Admitting: Psychiatry

## 2016-02-13 ENCOUNTER — Ambulatory Visit: Payer: Medicare Other | Admitting: Psychiatry

## 2016-02-13 ENCOUNTER — Ambulatory Visit (INDEPENDENT_AMBULATORY_CARE_PROVIDER_SITE_OTHER): Payer: Medicare Other | Admitting: Psychiatry

## 2016-02-13 VITALS — BP 124/82 | HR 89 | Temp 97.9°F | Ht 70.5 in | Wt 179.8 lb

## 2016-02-13 DIAGNOSIS — F331 Major depressive disorder, recurrent, moderate: Secondary | ICD-10-CM

## 2016-02-13 DIAGNOSIS — F5101 Primary insomnia: Secondary | ICD-10-CM

## 2016-02-13 MED ORDER — ESCITALOPRAM OXALATE 20 MG PO TABS: 20.0000 mg | ORAL_TABLET | Freq: Every day | ORAL | Status: DC

## 2016-02-13 MED ORDER — LITHIUM CARBONATE ER 450 MG PO TBCR: 450.0000 mg | EXTENDED_RELEASE_TABLET | Freq: Every day | ORAL | Status: DC

## 2016-02-13 MED ORDER — QUETIAPINE FUMARATE 50 MG PO TABS: 50.0000 mg | ORAL_TABLET | Freq: Every day | ORAL | Status: DC

## 2016-02-13 MED ORDER — GABAPENTIN 300 MG PO CAPS: 300.0000 mg | ORAL_CAPSULE | Freq: Every day | ORAL | Status: DC

## 2016-02-13 MED ORDER — PRAZOSIN HCL 1 MG PO CAPS: 1.0000 mg | ORAL_CAPSULE | Freq: Every day | ORAL | Status: DC
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
## 2016-02-16 ENCOUNTER — Encounter: Payer: Self-pay | Admitting: Family Medicine

## 2016-02-16 ENCOUNTER — Ambulatory Visit (INDEPENDENT_AMBULATORY_CARE_PROVIDER_SITE_OTHER): Payer: Medicare Other | Admitting: Family Medicine

## 2016-02-16 VITALS — BP 120/80 | HR 115 | Temp 98.5°F | Resp 16 | Ht 70.5 in | Wt 179.0 lb

## 2016-02-16 DIAGNOSIS — M7062 Trochanteric bursitis, left hip: Secondary | ICD-10-CM | POA: Diagnosis not present

## 2016-02-16 DIAGNOSIS — M5020 Other cervical disc displacement, unspecified cervical region: Secondary | ICD-10-CM

## 2016-02-16 MED ORDER — OXYCODONE HCL 15 MG PO TABS: 30.0000 mg | ORAL_TABLET | ORAL | Status: DC | PRN

## 2016-02-16 MED ORDER — METHYLPREDNISOLONE ACETATE 80 MG/ML IJ SUSP: 80.0000 mg | Freq: Once | INTRAMUSCULAR | Status: DC

## 2016-02-16 MED ORDER — METHYLPREDNISOLONE ACETATE 80 MG/ML IJ SUSP
80.0000 mg | Freq: Once | INTRAMUSCULAR | Status: AC
  Administered 2016-02-16: 80 mg via INTRAMUSCULAR

## 2016-02-16 NOTE — Progress Notes (Signed)
Patient: Ray Robles Male    DOB: [birth year]   55 y.o.   MRN: [id number] Visit Date: 02/16/2016  Today's Provider: Lelon Huh, MD   Chief Complaint  Patient presents with  . Follow-up  . Hip Pain   Subjective:    HPI  Follow-up for left hip pain 11/10/2015; Complains of persistent pain left and right hip for the last few months. He was treated with injection of depo-medrol in left hip 11/10/2015. He states it was very effective requests same injection for his right hip today.   Patient also has had nausea and vomiting for past day and half, which he has intermittently. He is drinking fluids and keeping down fairly well  He is also here to follow up on medication for chronic back pain which he states are working well without adverse effect.    Allergies  Allergen Reactions  . Ambien  [Zolpidem Tartrate]     Violent sleep  . Ibuprofen Other (See Comments)    Can't take because of liver  . Morphine Nausea Only, Nausea And Vomiting and Other (See Comments)    Other reaction(s): Vomiting N&V, Diarrhea  . Morphine And Related Nausea And Vomiting    N&V, Diarrhea  . Tylenol [Acetaminophen] Other (See Comments)    Can't take because of liver  . Xanax [Alprazolam] Other (See Comments)    Brings the ammonia levels up  . Zolpidem Other (See Comments)    Violent Sleep   Previous Medications   DIPHENOXYLATE-ATROPINE (LOMOTIL) 2.5-0.025 MG PER TABLET    Take 2 tablets by mouth every 6 (six) hours as needed. For diarrhea   ESCITALOPRAM (LEXAPRO) 20 MG TABLET    Take 1 tablet (20 mg total) by mouth daily.   FUROSEMIDE (LASIX) 20 MG TABLET    Take 20 mg by mouth daily.   GABAPENTIN (NEURONTIN) 300 MG CAPSULE    Take 1 capsule (300 mg total) by mouth at bedtime.   HYOSCYAMINE (ANASPAZ) 0.125 MG TBDP DISINTERGRATING TABLET    Place 0.125 mg under the tongue every 4 (four) hours as needed for bladder spasms or cramping.   LACTULOSE (CHRONULAC) 10 GM/15ML SOLUTION    Take 30 mLs  by mouth 3 (three) times daily.   LITHIUM CARBONATE (ESKALITH) 450 MG CR TABLET    Take 1 tablet (450 mg total) by mouth at bedtime.   METOPROLOL TARTRATE (LOPRESSOR) 25 MG TABLET    Take 25 mg by mouth 2 (two) times daily.   NITROGLYCERIN (NITROSTAT) 0.4 MG SL TABLET    Place 0.4 mg under the tongue every 5 (five) minutes as needed for chest pain.   NYSTATIN CREAM (MYCOSTATIN)    Apply 1 application topically 3 (three) times daily as needed.   OMEPRAZOLE (PRILOSEC) 20 MG CAPSULE    Take 2 capsules (40 mg total) by mouth daily.   ONDANSETRON (ZOFRAN-ODT) 4 MG DISINTEGRATING TABLET    Take 4 mg by mouth every 8 (eight) hours as needed for nausea or vomiting.   OXYCODONE (ROXICODONE) 15 MG IMMEDIATE RELEASE TABLET    Take 2 tablets (30 mg total) by mouth every 3 (three) hours as needed. pain   POLYETHYLENE GLYCOL-ELECTROLYTES (NULYTELY/GOLYTELY) 420 G SOLUTION       PRAZOSIN (MINIPRESS) 1 MG CAPSULE    Take 1 capsule (1 mg total) by mouth at bedtime.   QUETIAPINE (SEROQUEL) 50 MG TABLET    Take 1 tablet (50 mg total) by mouth at bedtime.  SILDENAFIL (REVATIO) 20 MG TABLET    Take 2-3 tablets by mouth as needed. No more than 3 in a day   SPIRONOLACTONE (ALDACTONE) 50 MG TABLET    Take 50 mg by mouth daily.    Review of Systems  Constitutional: Negative for fever, chills and appetite change.  Respiratory: Negative for chest tightness, shortness of breath and wheezing.   Cardiovascular: Negative for chest pain and palpitations.  Gastrointestinal: Positive for nausea and vomiting. Negative for abdominal pain.  follow-up for left hip pain  Social History  Substance Use Topics  . Smoking status: Never Smoker   . Smokeless tobacco: Never Used  . Alcohol Use: No   Objective:   BP 120/80 mmHg  Pulse 115  Temp(Src) 98.5 F (36.9 C) (Oral)  Resp 16  Ht 5' 10.5" (1.791 m)  Wt 179 lb (81.194 kg)  BMI 25.31 kg/m2  SpO2 99%  Physical Exam  General appearance: alert, well developed, well  nourished, cooperative and in no distress Head: Normocephalic, without obvious abnormality, atraumatic Lungs: Respirations even and unlabored Extremities: Moderate tenderness over left greater trochanter Abd: No CVA or abdominal tenderness.      Assessment & Plan:     1. Trochanteric bursitis of left hip  - methylPREDNISolone acetate (DEPO-MEDROL) injection 80 mg; Inject 1 mL (80 mg total) into the bursae of left greater trochanter. Patient tolerated well without adverse effects.   2. Herniated cervical disc Chronic pain relative well controlled.  - oxyCODONE (ROXICODONE) 15 MG immediate release tablet; Take 2 tablets (30 mg total) by mouth every 3 (three) hours as needed. pain  Dispense: 300 tablet; Refill: 0        Lelon Huh, MD  [facility]

## 2016-03-04 DIAGNOSIS — E119 Type 2 diabetes mellitus without complications: Secondary | ICD-10-CM | POA: Diagnosis not present

## 2016-03-04 DIAGNOSIS — D3131 Benign neoplasm of right choroid: Secondary | ICD-10-CM | POA: Diagnosis not present

## 2016-03-04 DIAGNOSIS — H2513 Age-related nuclear cataract, bilateral: Secondary | ICD-10-CM | POA: Diagnosis not present

## 2016-03-05 ENCOUNTER — Other Ambulatory Visit: Payer: Self-pay | Admitting: Family Medicine

## 2016-03-06 ENCOUNTER — Telehealth: Payer: Self-pay | Admitting: Family Medicine

## 2016-03-06 DIAGNOSIS — M5020 Other cervical disc displacement, unspecified cervical region: Secondary | ICD-10-CM

## 2016-03-06 MED ORDER — OXYCODONE HCL 15 MG PO TABS: 30.0000 mg | ORAL_TABLET | ORAL | Status: DC | PRN

## 2016-03-06 NOTE — Telephone Encounter (Signed)
Pt contacted office for refill request on the following medications:  oxyCODONE (ROXICODONE) 15 MG immediate release tablet.  [id number]

## 2016-03-06 NOTE — Telephone Encounter (Signed)
Last OV: 02/16/2016  Last Refill: 02/16/2016

## 2016-03-12 ENCOUNTER — Encounter: Payer: Self-pay | Admitting: Psychiatry

## 2016-03-12 ENCOUNTER — Ambulatory Visit (INDEPENDENT_AMBULATORY_CARE_PROVIDER_SITE_OTHER): Payer: Medicare Other | Admitting: Psychiatry

## 2016-03-12 VITALS — BP 122/84 | HR 84 | Temp 97.2°F | Ht 70.5 in | Wt 191.2 lb

## 2016-03-12 DIAGNOSIS — F5101 Primary insomnia: Secondary | ICD-10-CM

## 2016-03-12 DIAGNOSIS — F331 Major depressive disorder, recurrent, moderate: Secondary | ICD-10-CM

## 2016-03-12 MED ORDER — LITHIUM CARBONATE ER 450 MG PO TBCR
450.0000 mg | EXTENDED_RELEASE_TABLET | Freq: Every day | ORAL | Status: DC
Start: 2016-03-12 — End: 2016-04-10

## 2016-03-12 MED ORDER — QUETIAPINE FUMARATE 100 MG PO TABS: 100.0000 mg | ORAL_TABLET | Freq: Every day | ORAL | Status: DC

## 2016-03-12 MED ORDER — ESCITALOPRAM OXALATE 20 MG PO TABS: 20.0000 mg | ORAL_TABLET | Freq: Every day | ORAL | Status: DC

## 2016-03-12 MED ORDER — GABAPENTIN 300 MG PO CAPS: 300.0000 mg | ORAL_CAPSULE | Freq: Every day | ORAL | Status: DC

## 2016-03-12 MED ORDER — PRAZOSIN HCL 2 MG PO CAPS: 2.0000 mg | ORAL_CAPSULE | Freq: Every day | ORAL | Status: DC
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
## 2016-03-27 ENCOUNTER — Encounter: Payer: Self-pay | Admitting: *Deleted

## 2016-03-27 ENCOUNTER — Ambulatory Visit (INDEPENDENT_AMBULATORY_CARE_PROVIDER_SITE_OTHER): Payer: Medicare Other | Admitting: Family Medicine

## 2016-03-27 ENCOUNTER — Encounter: Payer: Self-pay | Admitting: Family Medicine

## 2016-03-27 VITALS — BP 120/74 | HR 89 | Temp 98.8°F | Resp 16 | Ht 70.5 in | Wt 192.0 lb

## 2016-03-27 DIAGNOSIS — I10 Essential (primary) hypertension: Secondary | ICD-10-CM | POA: Diagnosis not present

## 2016-03-27 DIAGNOSIS — K4090 Unilateral inguinal hernia, without obstruction or gangrene, not specified as recurrent: Secondary | ICD-10-CM | POA: Diagnosis not present

## 2016-03-27 DIAGNOSIS — M5020 Other cervical disc displacement, unspecified cervical region: Secondary | ICD-10-CM | POA: Diagnosis not present

## 2016-03-27 DIAGNOSIS — G8929 Other chronic pain: Secondary | ICD-10-CM | POA: Diagnosis not present

## 2016-03-27 MED ORDER — SPIRONOLACTONE 50 MG PO TABS: 50.0000 mg | ORAL_TABLET | Freq: Every day | ORAL | Status: DC

## 2016-03-27 MED ORDER — METOPROLOL TARTRATE 25 MG PO TABS: 25.0000 mg | ORAL_TABLET | Freq: Two times a day (BID) | ORAL | Status: DC

## 2016-03-27 MED ORDER — OXYCODONE HCL 15 MG PO TABS: 30.0000 mg | ORAL_TABLET | ORAL | Status: DC | PRN

## 2016-03-27 NOTE — Progress Notes (Signed)
Patient: Ray Robles Male    DOB: [birth year]   55 y.o.   MRN: [id number] Visit Date: 03/27/2016  Today's Provider: Lelon Huh, MD   Chief Complaint  Patient presents with  . Referral   Subjective:    HPI  Has had right inguinal hernia repaired in 2013. He states several months later and started to bulge again and has been growing every since. He states inguinal area will to size of golf several days at a time and has become increasing painful when it does. He is concerned about strangulation and wishes to get back in with surgeon for repair.   He is also here to follow up with hypertension and pain from herniated lumbar disks. He states current medications are working well and needs refills.    Lab Results  Component Value Date   CREATININE 1.06 03/29/2015   BUN 24* 03/29/2015   NA 136 03/29/2015   K 3.6 03/29/2015   CL 104 03/29/2015   CO2 23 03/29/2015    Allergies  Allergen Reactions  . Ambien  [Zolpidem Tartrate]     Violent sleep  . Ibuprofen Other (See Comments)    Can't take because of liver  . Morphine Nausea Only, Nausea And Vomiting and Other (See Comments)    Other reaction(s): Vomiting N&V, Diarrhea  . Morphine And Related Nausea And Vomiting    N&V, Diarrhea  . Tylenol [Acetaminophen] Other (See Comments)    Can't take because of liver  . Xanax [Alprazolam] Other (See Comments)    Brings the ammonia levels up  . Zolpidem Other (See Comments)    Violent Sleep   Previous Medications   DIPHENOXYLATE-ATROPINE (LOMOTIL) 2.5-0.025 MG PER TABLET    Take 2 tablets by mouth every 6 (six) hours as needed. For diarrhea   ESCITALOPRAM (LEXAPRO) 20 MG TABLET    Take 1 tablet (20 mg total) by mouth daily.   FUROSEMIDE (LASIX) 20 MG TABLET    Take 20 mg by mouth daily.   GABAPENTIN (NEURONTIN) 300 MG CAPSULE    Take 1 capsule (300 mg total) by mouth at bedtime.   HYOSCYAMINE (ANASPAZ) 0.125 MG TBDP DISINTERGRATING TABLET    Place 0.125 mg under the  tongue every 4 (four) hours as needed for bladder spasms or cramping.   LACTULOSE (CHRONULAC) 10 GM/15ML SOLUTION    Take 30 mLs by mouth 3 (three) times daily.   LITHIUM CARBONATE (ESKALITH) 450 MG CR TABLET    Take 1 tablet (450 mg total) by mouth at bedtime.   METOPROLOL TARTRATE (LOPRESSOR) 25 MG TABLET    Take 25 mg by mouth 2 (two) times daily.   NITROGLYCERIN (NITROSTAT) 0.4 MG SL TABLET    Place 0.4 mg under the tongue every 5 (five) minutes as needed for chest pain.   NYSTATIN CREAM (MYCOSTATIN)    Apply 1 application topically 3 (three) times daily as needed.   OMEPRAZOLE (PRILOSEC) 20 MG CAPSULE    Take 2 capsules (40 mg total) by mouth daily.   ONDANSETRON (ZOFRAN-ODT) 4 MG DISINTEGRATING TABLET    Take 4 mg by mouth every 8 (eight) hours as needed for nausea or vomiting.   OXYCODONE (ROXICODONE) 15 MG IMMEDIATE RELEASE TABLET    Take 2 tablets (30 mg total) by mouth every 3 (three) hours as needed. pain   POLYETHYLENE GLYCOL-ELECTROLYTES (NULYTELY/GOLYTELY) 420 G SOLUTION       PRAZOSIN (MINIPRESS) 2 MG CAPSULE    Take 1 capsule (  2 mg total) by mouth at bedtime.   QUETIAPINE (SEROQUEL) 100 MG TABLET    Take 1 tablet (100 mg total) by mouth at bedtime.   SILDENAFIL (REVATIO) 20 MG TABLET    Take 2-3 tablets by mouth as needed. No more than 3 in a day   SPIRONOLACTONE (ALDACTONE) 50 MG TABLET    Take 50 mg by mouth daily.    Review of Systems  Constitutional: Negative for fever, chills and appetite change.  Respiratory: Negative for chest tightness, shortness of breath and wheezing.   Cardiovascular: Negative for chest pain and palpitations.  Gastrointestinal: Negative for nausea, vomiting and abdominal pain.    Social History  Substance Use Topics  . Smoking status: Never Smoker   . Smokeless tobacco: Never Used  . Alcohol Use: No   Objective:   BP 120/74 mmHg  Pulse 89  Temp(Src) 98.8 F (37.1 C) (Oral)  Resp 16  Ht 5' 10.5" (1.791 m)  Wt 192 lb (87.091 kg)  BMI  27.15 kg/m2  SpO2 97%  Physical Exam  General appearance: alert, well developed, well nourished, cooperative and in no distress Head: Normocephalic, without obvious abnormality, atraumatic Groin: Right inguinal bulging, reducible, consistent with hernia. . CV: RRR without murmurs.     Assessment & Plan:      1. Hernia, inguinal, right  - Ambulatory referral to General Surgery  2. Chronic pain Well controlled.  Continue current medications.    3. Herniated cervical disc  - oxyCODONE (ROXICODONE) 15 MG immediate release tablet; Take 2 tablets (30 mg total) by mouth every 3 (three) hours as needed. pain  Dispense: 300 tablet; Refill: 0  4. Essential hypertension Well controlled.  Continue current medications.   - metoprolol tartrate (LOPRESSOR) 25 MG tablet; Take 1 tablet (25 mg total) by mouth 2 (two) times daily.  Dispense: 30 tablet; Refill: 12 - spironolactone (ALDACTONE) 50 MG tablet; Take 1 tablet (50 mg total) by mouth daily.  Dispense: 30 tablet; Refill: 6        Lelon Huh, MD  [facility]

## 2016-04-03 ENCOUNTER — Ambulatory Visit: Payer: Self-pay | Admitting: General Surgery

## 2016-04-09 ENCOUNTER — Ambulatory Visit (INDEPENDENT_AMBULATORY_CARE_PROVIDER_SITE_OTHER): Payer: Medicare Other | Admitting: General Surgery

## 2016-04-09 ENCOUNTER — Encounter: Payer: Self-pay | Admitting: General Surgery

## 2016-04-09 VITALS — BP 172/84 | HR 92 | Resp 16 | Ht 70.0 in | Wt 195.0 lb

## 2016-04-09 DIAGNOSIS — K4090 Unilateral inguinal hernia, without obstruction or gangrene, not specified as recurrent: Secondary | ICD-10-CM | POA: Diagnosis not present

## 2016-04-09 NOTE — Progress Notes (Signed)
Patient ID: Ray Robles, male   DOB: November 30, 1961, 55 y.o.   MRN: [id number]  Chief Complaint  Patient presents with  . Other    right inguinal hernia    HPI Ray Robles is a 55 y.o. male here today for a evaluation of  a right inguinal hernia.  He has as had right inguinal hernia repaired in 2013.[name] After that surgery about 4 mos later he was noted to have a recurrence and was thought to be a femoral hernia. He had not followed up after that. I have reviewed the history of present illness with the patient. HPI  Past Medical History  Diagnosis Date  . Hx of endoscopy     05/12/2014- [facility]. Dr Candace Cruise. Benign appearing stricture. Dilated the examination was otherwise normal.02/13/2012- Beniign appearing intrinsic mild stenosis at GE junction, successfully dialated  . H/O bacterial pneumonia 11/11/2007    2008 and 2015   . Heart murmur   . Liver disease   . Hepatitis C     Past Surgical History  Procedure Laterality Date  . Hernia repair  08/2012    Dr. Jamal Collin; [facility]  . Colonoscopy with propofol N/A 05/12/2015    Procedure: COLONOSCOPY WITH PROPOFOL;  Surgeon: Hulen Luster, MD;  Location: [facility] ENDOSCOPY;  Service: Gastroenterology;  Laterality: N/A;    Family History  Problem Relation Age of Onset  . Cancer Mother   . Cancer Father   . Alcohol abuse Brother   . Alcohol abuse Brother   . Drug abuse Brother     Social History Social History  Substance Use Topics  . Smoking status: Never Smoker   . Smokeless tobacco: Never Used  . Alcohol Use: No    Allergies  Allergen Reactions  . Ambien  [Zolpidem Tartrate]     Violent sleep  . Ibuprofen Other (See Comments)    Can't take because of liver  . Morphine Nausea Only, Nausea And Vomiting and Other (See Comments)    Other reaction(s): Vomiting N&V, Diarrhea  . Morphine And Related Nausea And Vomiting    N&V, Diarrhea  . Tylenol [Acetaminophen] Other (See Comments)    Can't take because of liver  . Xanax [Alprazolam] Other  (See Comments)    Brings the ammonia levels up  . Zolpidem Other (See Comments)    Violent Sleep    Current Outpatient Prescriptions  Medication Sig Dispense Refill  . diphenoxylate-atropine (LOMOTIL) 2.5-0.025 MG per tablet Take 2 tablets by mouth every 6 (six) hours as needed. For diarrhea    . furosemide (LASIX) 20 MG tablet Take 20 mg by mouth daily.    . hyoscyamine (ANASPAZ) 0.125 MG TBDP disintergrating tablet Place 0.125 mg under the tongue every 4 (four) hours as needed for bladder spasms or cramping.    . lactulose (CHRONULAC) 10 GM/15ML solution Take 30 mLs by mouth 3 (three) times daily.  0  . metoprolol tartrate (LOPRESSOR) 25 MG tablet Take 1 tablet (25 mg total) by mouth 2 (two) times daily. 30 tablet 12  . nitroGLYCERIN (NITROSTAT) 0.4 MG SL tablet Place 0.4 mg under the tongue every 5 (five) minutes as needed for chest pain.    [name] nystatin cream (MYCOSTATIN) Apply 1 application topically 3 (three) times daily as needed.    [name] omeprazole (PRILOSEC) 20 MG capsule Take 2 capsules (40 mg total) by mouth daily. 60 capsule 12  . ondansetron (ZOFRAN-ODT) 4 MG disintegrating tablet Take 4 mg by mouth every 8 (eight) hours as needed  for nausea or vomiting.    [name] oxyCODONE (ROXICODONE) 15 MG immediate release tablet Take 2 tablets (30 mg total) by mouth every 3 (three) hours as needed. pain 300 tablet 0  . polyethylene glycol-electrolytes (NULYTELY/GOLYTELY) 420 g solution     . prazosin (MINIPRESS) 2 MG capsule Take 1 capsule (2 mg total) by mouth at bedtime. 30 capsule 1  . sildenafil (REVATIO) 20 MG tablet Take 2-3 tablets by mouth as needed. No more than 3 in a day    . spironolactone (ALDACTONE) 50 MG tablet Take 1 tablet (50 mg total) by mouth daily. 30 tablet 6  . escitalopram (LEXAPRO) 20 MG tablet Take 1 tablet (20 mg total) by mouth daily. 30 tablet 1  . gabapentin (NEURONTIN) 300 MG capsule Take 1 capsule (300 mg total) by mouth at bedtime. 30 capsule 1  . lithium carbonate  (ESKALITH) 450 MG CR tablet Take 1 tablet (450 mg total) by mouth at bedtime. 30 tablet 1  . QUEtiapine (SEROQUEL) 200 MG tablet Take 1 tablet (200 mg total) by mouth at bedtime. 30 tablet 1   No current facility-administered medications for this visit.    Review of Systems Review of Systems  Constitutional: Negative.   Respiratory: Negative.   Cardiovascular: Negative.     Blood pressure 172/84, pulse 92, resp. rate 16, height 5\' 10"  (1.778 m), weight 195 lb (88.451 kg).  Physical Exam Physical Exam  Constitutional: He is oriented to person, place, and time. He appears well-developed and well-nourished.  Eyes: Conjunctivae are normal. No scleral icterus.  Neck: Neck supple.  Cardiovascular: Normal rate, regular rhythm and normal heart sounds.   Pulmonary/Chest: Effort normal and breath sounds normal.  Abdominal: Soft. Normal appearance and bowel sounds are normal. There is no hepatomegaly. There is no tenderness. A hernia is present. Hernia confirmed positive in the right inguinal area and confirmed positive in the left inguinal area.    Lymphadenopathy:    He has no cervical adenopathy.  Neurological: He is alert and oriented to person, place, and time.  Skin: Skin is warm and dry.    Data Reviewed Notes reviewed from [place] left inguinal hernia and right inguinal/femoral hernia    Plan    Hernia precautions and incarceration were discussed with the patient. If they develop symptoms of an incarcerated hernia, they were encouraged to seek prompt medical attention.  I have recommended repair of the hernia using mesh on an outpatient basis in the near future. The risk of infection was reviewed. The role of prosthetic mesh to minimize the risk of recurrence was reviewed. Best that the procedure be done with laparoscopic approach. Pt was advised fully and he is agreeable.    PCP:  Birdie Sons This information has been scribed by Gaspar Cola  CMA.    SANKAR,SEEPLAPUTHUR G 04/11/2016, 8:49 AM

## 2016-04-09 NOTE — Patient Instructions (Signed)

## 2016-04-10 ENCOUNTER — Ambulatory Visit (INDEPENDENT_AMBULATORY_CARE_PROVIDER_SITE_OTHER): Payer: Medicare Other | Admitting: Psychiatry

## 2016-04-10 ENCOUNTER — Encounter: Payer: Self-pay | Admitting: Psychiatry

## 2016-04-10 VITALS — BP 138/82 | HR 90 | Temp 97.4°F | Ht 70.0 in | Wt 196.6 lb

## 2016-04-10 DIAGNOSIS — F331 Major depressive disorder, recurrent, moderate: Secondary | ICD-10-CM

## 2016-04-10 MED ORDER — QUETIAPINE FUMARATE 200 MG PO TABS: 200.0000 mg | ORAL_TABLET | Freq: Every day | ORAL | Status: DC

## 2016-04-10 MED ORDER — ESCITALOPRAM OXALATE 20 MG PO TABS: 20.0000 mg | ORAL_TABLET | Freq: Every day | ORAL | Status: DC

## 2016-04-10 MED ORDER — LITHIUM CARBONATE ER 450 MG PO TBCR: 450.0000 mg | EXTENDED_RELEASE_TABLET | Freq: Every day | ORAL | Status: DC

## 2016-04-10 MED ORDER — GABAPENTIN 300 MG PO CAPS: 300.0000 mg | ORAL_CAPSULE | Freq: Every day | ORAL | Status: DC
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
## 2016-04-11 ENCOUNTER — Encounter: Payer: Self-pay | Admitting: General Surgery

## 2016-04-15 ENCOUNTER — Other Ambulatory Visit: Payer: Self-pay | Admitting: Family Medicine

## 2016-04-15 DIAGNOSIS — M5020 Other cervical disc displacement, unspecified cervical region: Secondary | ICD-10-CM

## 2016-04-15 MED ORDER — OXYCODONE HCL 15 MG PO TABS: 30.0000 mg | ORAL_TABLET | ORAL | Status: DC | PRN

## 2016-04-15 NOTE — Telephone Encounter (Signed)
Pt contacted office for refill request on the following medications: oxyCODONE (ROXICODONE) 15 MG immediate release tablet. Pt would like to pick this up today if possible b/c he is leaving for the beach today.  Last written: 03/27/16 Last OV: 03/27/16 Please advise. Thanks TNP

## 2016-04-25 ENCOUNTER — Telehealth: Payer: Self-pay | Admitting: Family Medicine

## 2016-04-25 NOTE — Telephone Encounter (Signed)
error 

## 2016-05-06 ENCOUNTER — Ambulatory Visit (INDEPENDENT_AMBULATORY_CARE_PROVIDER_SITE_OTHER): Payer: Medicare Other | Admitting: Family Medicine

## 2016-05-06 ENCOUNTER — Encounter: Payer: Self-pay | Admitting: Family Medicine

## 2016-05-06 VITALS — BP 152/85 | HR 72 | Temp 97.5°F | Resp 16 | Wt 208.0 lb

## 2016-05-06 DIAGNOSIS — M25559 Pain in unspecified hip: Secondary | ICD-10-CM

## 2016-05-06 DIAGNOSIS — M5020 Other cervical disc displacement, unspecified cervical region: Secondary | ICD-10-CM | POA: Diagnosis not present

## 2016-05-06 DIAGNOSIS — M5126 Other intervertebral disc displacement, lumbar region: Secondary | ICD-10-CM

## 2016-05-06 DIAGNOSIS — G8929 Other chronic pain: Secondary | ICD-10-CM

## 2016-05-06 MED ORDER — OXYCODONE HCL 15 MG PO TABS: 30.0000 mg | ORAL_TABLET | ORAL | Status: DC | PRN

## 2016-05-06 NOTE — Progress Notes (Signed)
Patient: Ray Robles Male    DOB: 1961/01/25   55 y.o.   MRN: [id number] Visit Date: 05/06/2016  Today's Provider: Lelon Huh, MD   Chief Complaint  Patient presents with  . Hip Pain   Subjective:    Hip Pain  Incident onset: several months ago. Patient was seen on 02/16/2016 for left hip Trochanteric bursitis. Patient was given 80mg  Depo-Medrol injection into bursae of left greater trochanter. The pain is present in the right hip and left hip. Quality: throbbing. The pain is severe. The pain has been worsening since onset. Exacerbated by: laying down. Treatments tried: prescription pain medication. The treatment provided mild relief.  Patient reports that pain radiates down his legs. He states that steroid injection do relieve pain temporarily, but the last injection only helped for a few days.  Patient would like to be referred to Dr. Jefm Bryant. Is keeping him all night.      Allergies  Allergen Reactions  . Ambien  [Zolpidem Tartrate]     Violent sleep  . Ibuprofen Other (See Comments)    Can't take because of liver  . Morphine Nausea Only, Nausea And Vomiting and Other (See Comments)    Other reaction(s): Vomiting N&V, Diarrhea  . Morphine And Related Nausea And Vomiting    N&V, Diarrhea  . Tylenol [Acetaminophen] Other (See Comments)    Can't take because of liver  . Xanax [Alprazolam] Other (See Comments)    Brings the ammonia levels up  . Zolpidem Other (See Comments)    Violent Sleep   Previous Medications   DIPHENOXYLATE-ATROPINE (LOMOTIL) 2.5-0.025 MG PER TABLET    Take 2 tablets by mouth every 6 (six) hours as needed. For diarrhea   ESCITALOPRAM (LEXAPRO) 20 MG TABLET    Take 1 tablet (20 mg total) by mouth daily.   FUROSEMIDE (LASIX) 20 MG TABLET    Take 20 mg by mouth daily.   GABAPENTIN (NEURONTIN) 300 MG CAPSULE    Take 1 capsule (300 mg total) by mouth at bedtime.   HYOSCYAMINE (ANASPAZ) 0.125 MG TBDP DISINTERGRATING TABLET    Place 0.125 mg under  the tongue every 4 (four) hours as needed for bladder spasms or cramping.   LACTULOSE (CHRONULAC) 10 GM/15ML SOLUTION    Take 30 mLs by mouth 3 (three) times daily.   LITHIUM CARBONATE (ESKALITH) 450 MG CR TABLET    Take 1 tablet (450 mg total) by mouth at bedtime.   METOPROLOL TARTRATE (LOPRESSOR) 25 MG TABLET    Take 1 tablet (25 mg total) by mouth 2 (two) times daily.   NITROGLYCERIN (NITROSTAT) 0.4 MG SL TABLET    Place 0.4 mg under the tongue every 5 (five) minutes as needed for chest pain.   NYSTATIN CREAM (MYCOSTATIN)    Apply 1 application topically 3 (three) times daily as needed.   OMEPRAZOLE (PRILOSEC) 20 MG CAPSULE    Take 2 capsules (40 mg total) by mouth daily.   ONDANSETRON (ZOFRAN-ODT) 4 MG DISINTEGRATING TABLET    Take 4 mg by mouth every 8 (eight) hours as needed for nausea or vomiting.   OXYCODONE (ROXICODONE) 15 MG IMMEDIATE RELEASE TABLET    Take 2 tablets (30 mg total) by mouth every 3 (three) hours as needed. pain   POLYETHYLENE GLYCOL-ELECTROLYTES (NULYTELY/GOLYTELY) 420 G SOLUTION       PRAZOSIN (MINIPRESS) 2 MG CAPSULE    Take 1 capsule (2 mg total) by mouth at bedtime.   QUETIAPINE (SEROQUEL) 200  MG TABLET    Take 1 tablet (200 mg total) by mouth at bedtime.   SILDENAFIL (REVATIO) 20 MG TABLET    Take 2-3 tablets by mouth as needed. No more than 3 in a day   SPIRONOLACTONE (ALDACTONE) 50 MG TABLET    Take 1 tablet (50 mg total) by mouth daily.    Review of Systems  Constitutional: Negative for fever, chills and appetite change.  Respiratory: Negative for chest tightness, shortness of breath and wheezing.   Cardiovascular: Negative for chest pain and palpitations.  Gastrointestinal: Negative for nausea, vomiting and abdominal pain.  Musculoskeletal: Positive for back pain and arthralgias.  Neurological: Positive for tremors.   Patient Active Problem List   Diagnosis Date Noted  . Actinic keratosis 01/29/2016  . Cirrhosis, non-alcoholic ([place]) 0000000  .  Abnormal LFTs 05/12/2015  . Ache in joint 05/12/2015  . Arthritis of knee 05/12/2015  . Chronic pain 05/12/2015  . Dysphagia 05/12/2015  . ED (erectile dysfunction) of organic origin 05/12/2015  . Hypokalemia 05/12/2015  . Positive urine drug screen 05/12/2015  . Renal function impairment 05/12/2015  . Hernia, inguinal, right 05/12/2015  . Seborrheic keratosis 05/12/2015  . Hepatic encephalopathy ([place])   . Hypertension   . Sleep apnea   . Anxiety   . Benign hypertension 04/01/2015  . Arthritis 03/22/2015  . BP (high blood pressure) 03/22/2015  . Migraine with aura 02/09/2010  . Prediabetes 08/03/2008  . Herniated cervical disc 07/07/2007  . Lumbar disc herniation 07/07/2007  . Insomnia 06/10/2007  . Ascites 05/06/2007  . Esophageal reflux 04/01/2005  . Depression, major, recurrent, moderate ([place]) 12/02/1998  . Neuropathy, peripheral, autonomic, idiopathic 12/02/1998  . Hepatitis C 12/02/1988   Past Medical History  Diagnosis Date  . Hx of endoscopy     05/12/2014- [facility]. Dr Candace Cruise. Benign appearing stricture. Dilated the examination was otherwise normal.02/13/2012- Beniign appearing intrinsic mild stenosis at GE junction, successfully dialated  . H/O bacterial pneumonia 11/11/2007    2008 and 2015   . Heart murmur   . Liver disease   . Hepatitis C      Social History  Substance Use Topics  . Smoking status: Never Smoker   . Smokeless tobacco: Never Used  . Alcohol Use: No   Objective:   BP 152/85 mmHg  Pulse 72  Temp(Src) 97.5 F (36.4 C) (Oral)  Resp 16  Wt 208 lb (94.348 kg)  SpO2 98%  Physical Exam  General appearance: alert, well developed, well nourished, cooperative and in no distress Head: Normocephalic, without obvious abnormality, atraumatic Lungs: Respirations even and unlabored Extremities: Moderate tenderness over left greater trochanter    Assessment & Plan:     1. Hip pain, unspecified laterality Has had several steroid injections without  relief. Has previously been followed by Dr. Jefm Bryant and desires re-evaluation - Ambulatory referral to Rheumatology  2. Herniated cervical disc  - Pain Mgt Scrn ([facility]), Ur - oxyCODONE (ROXICODONE) 15 MG immediate release tablet; Take 2 tablets (30 mg total) by mouth every 3 (three) hours as needed. pain  Dispense: 300 tablet; Refill: 0  3. Lumbar disc herniation Patient has been maintained for extended period of time on current pain medication regiment. He states he still has pain every day, but medications allow him to be functional. His wife Tye [place] is here and states she keeps track of his pain medications. She states that she counts out and gives him 1 day's worth of oxycodone at the beginning of each day, and  that he carries it with him throughout the day taking it on the prescribed scheduled.    4. Chronic pain  Reviewed patient's history of drug abuse. He initially stated he used to do all kinds of illegal drugs many years. He went on to state that he mainly did cocaine and marijuana, and used an illicit IV drug one time in the past (not heroin per his report). He admits to using marijuana on rare occasions which have been detected in previous drug screens, but he denies taken any illegal drugs besides marijuana in many years.        Lelon Huh, MD  [facility]

## 2016-05-08 ENCOUNTER — Other Ambulatory Visit: Payer: Self-pay | Admitting: Psychiatry

## 2016-05-08 ENCOUNTER — Ambulatory Visit: Payer: Medicare Other | Admitting: Psychiatry

## 2016-05-09 LAB — PAIN MGT SCRN (14 DRUGS), UR
AMPHETAMINE SCRN UR: NEGATIVE ng/mL
Barbiturate Screen, Ur: NEGATIVE ng/mL
Benzodiazepine Screen, Urine: NEGATIVE ng/mL
Buprenorphine, Urine: POSITIVE ng/mL
CANNABINOIDS UR QL SCN: POSITIVE ng/mL
COCAINE(METAB.) SCREEN, URINE: NEGATIVE ng/mL
CREATININE(CRT), U: 132.2 mg/dL (ref 20.0–300.0)
FENTANYL, URINE: NEGATIVE pg/mL
MEPERIDINE SCREEN, URINE: NEGATIVE ng/mL
METHADONE SCREEN, URINE: NEGATIVE ng/mL
OPIATE SCRN UR: POSITIVE ng/mL
OXYCODONE+OXYMORPHONE UR QL SCN: POSITIVE ng/mL
PCP SCRN UR: NEGATIVE ng/mL
Ph of Urine: 5.6 (ref 4.5–8.9)
Propoxyphene, Screen: NEGATIVE ng/mL
Tramadol Ur Ql Scn: NEGATIVE ng/mL

## 2016-05-16 ENCOUNTER — Encounter: Payer: Self-pay | Admitting: General Surgery

## 2016-05-16 ENCOUNTER — Encounter: Payer: Self-pay | Admitting: Family Medicine

## 2016-05-20 ENCOUNTER — Encounter: Payer: Self-pay | Admitting: Family Medicine

## 2016-05-20 DIAGNOSIS — G8929 Other chronic pain: Secondary | ICD-10-CM | POA: Diagnosis not present

## 2016-05-20 DIAGNOSIS — M25552 Pain in left hip: Secondary | ICD-10-CM | POA: Diagnosis not present

## 2016-05-20 DIAGNOSIS — M25551 Pain in right hip: Secondary | ICD-10-CM | POA: Diagnosis not present

## 2016-05-20 DIAGNOSIS — M7062 Trochanteric bursitis, left hip: Secondary | ICD-10-CM | POA: Diagnosis not present

## 2016-05-20 DIAGNOSIS — K7469 Other cirrhosis of liver: Secondary | ICD-10-CM | POA: Diagnosis not present

## 2016-05-22 ENCOUNTER — Other Ambulatory Visit: Payer: Self-pay | Admitting: Family Medicine

## 2016-05-22 DIAGNOSIS — M5020 Other cervical disc displacement, unspecified cervical region: Secondary | ICD-10-CM

## 2016-05-22 NOTE — Progress Notes (Signed)
Weaning prescription for oxycodone

## 2016-05-24 ENCOUNTER — Telehealth: Payer: Self-pay | Admitting: Family Medicine

## 2016-05-24 MED ORDER — OXYCODONE HCL 15 MG PO TABS: ORAL_TABLET | ORAL | Status: AC

## 2016-05-24 NOTE — Telephone Encounter (Signed)
Attempted to call patient x 3 since 05-16-2016 to advise him results of UDS that was positive for multiple controlled drugs that are not prescribed for him. Letter sent by certified mail on 0000000 notifying patient he is being discharged from practice due to having positive urine drug screens on multiple occasions. There has yet to be a response from patient. Have printed prescription for tapering dose of oxycodone for patient to take until he gets into drug abuse program and finds a new medical provider.

## 2016-05-27 ENCOUNTER — Other Ambulatory Visit: Payer: Self-pay | Admitting: Family Medicine

## 2016-05-27 NOTE — Telephone Encounter (Signed)
Pt requesting refill of oxyCODONE (ROXICODONE) 15 MG immediate release tablet

## 2016-05-27 NOTE — Telephone Encounter (Signed)
Rx ready for pt pick-up.

## 2016-05-29 ENCOUNTER — Telehealth: Payer: Self-pay | Admitting: Family Medicine

## 2016-05-29 NOTE — Telephone Encounter (Signed)
Pt's wife states her husband needs a prior auth for a medication her husband gets every month.  She is unsure of the name of the medication and said [facility] has faxed over the form for prior auth and we need to send it in.  She stated they had to pay out of pocket again this month.

## 2016-05-29 NOTE — Telephone Encounter (Signed)
Tried calling patient and his wife to get more information and the name of the medication. Left message to call back.

## 2016-05-30 ENCOUNTER — Telehealth: Payer: Self-pay | Admitting: Family Medicine

## 2016-05-30 NOTE — Telephone Encounter (Signed)
Wife Ray Robles calling stating she is returning a call from [place] about her husband prior authorization on his medication. Please return her call. Thanks CC

## 2016-05-30 NOTE — Telephone Encounter (Signed)
I spoke with Burman Nieves from [place]. She states that the Oxycodone 15mg  requires Prior Auth. It was covered on May 15th, but on June 5th coverage stopped. Burman Nieves states that the Prior Josem Kaufmann is required because the insurance only allows a max of 6 pills per day. The way the prescription is written averages out to the patient taking 7.5 pills a day. Maggie states we can change the directions to where the patient takes no more than 6 a day, and it would be covered. She also states that the patient had to pay out of pocket $50 for the prescription that was filled on 05/27/2016. Maggie states the patient will be reimbursed $50, if we change the directions. Or do you want to proceed with doing a Prior Auth for Oxycodone 15 as it was last written? The number to call for Prior Auth Medicare Part D patients is  [phone].

## 2016-05-30 NOTE — Telephone Encounter (Signed)
Spoke with Pharmacist Maggie at [facility]. See Separate message.

## 2016-05-31 ENCOUNTER — Other Ambulatory Visit: Payer: Self-pay | Admitting: Psychiatry

## 2016-05-31 ENCOUNTER — Telehealth: Payer: Self-pay | Admitting: Family Medicine

## 2016-05-31 NOTE — Telephone Encounter (Signed)
Information faxed to Dr Sharlet Salina.His office will contact pt

## 2016-06-05 ENCOUNTER — Other Ambulatory Visit: Payer: Self-pay | Admitting: Psychiatry

## 2016-06-07 DIAGNOSIS — K4090 Unilateral inguinal hernia, without obstruction or gangrene, not specified as recurrent: Secondary | ICD-10-CM | POA: Diagnosis not present

## 2016-06-07 DIAGNOSIS — K766 Portal hypertension: Secondary | ICD-10-CM | POA: Diagnosis not present

## 2016-06-07 DIAGNOSIS — B182 Chronic viral hepatitis C: Secondary | ICD-10-CM | POA: Diagnosis not present

## 2016-06-07 DIAGNOSIS — K449 Diaphragmatic hernia without obstruction or gangrene: Secondary | ICD-10-CM | POA: Diagnosis not present

## 2016-06-07 DIAGNOSIS — K7460 Unspecified cirrhosis of liver: Secondary | ICD-10-CM | POA: Diagnosis not present

## 2016-06-07 DIAGNOSIS — R932 Abnormal findings on diagnostic imaging of liver and biliary tract: Secondary | ICD-10-CM | POA: Diagnosis not present

## 2016-06-11 ENCOUNTER — Ambulatory Visit (INDEPENDENT_AMBULATORY_CARE_PROVIDER_SITE_OTHER): Payer: Medicare Other | Admitting: Psychiatry

## 2016-06-11 DIAGNOSIS — F5101 Primary insomnia: Secondary | ICD-10-CM

## 2016-06-11 DIAGNOSIS — F331 Major depressive disorder, recurrent, moderate: Secondary | ICD-10-CM | POA: Diagnosis not present

## 2016-06-11 MED ORDER — ESCITALOPRAM OXALATE 20 MG PO TABS: 20.0000 mg | ORAL_TABLET | Freq: Every day | ORAL | Status: DC

## 2016-06-11 MED ORDER — GABAPENTIN 300 MG PO CAPS: 300.0000 mg | ORAL_CAPSULE | Freq: Every day | ORAL | Status: DC

## 2016-06-11 MED ORDER — PRAZOSIN HCL 2 MG PO CAPS
2.0000 mg | ORAL_CAPSULE | Freq: Every day | ORAL | Status: DC
Start: 2016-06-11 — End: 2016-07-16

## 2016-06-11 MED ORDER — QUETIAPINE FUMARATE 200 MG PO TABS: 200.0000 mg | ORAL_TABLET | Freq: Every day | ORAL | Status: DC

## 2016-06-11 MED ORDER — LITHIUM CARBONATE ER 450 MG PO TBCR: 450.0000 mg | EXTENDED_RELEASE_TABLET | Freq: Every day | ORAL | Status: DC
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
## 2016-06-12 ENCOUNTER — Ambulatory Visit: Payer: Self-pay | Admitting: Family Medicine

## 2016-06-12 ENCOUNTER — Telehealth: Payer: Self-pay | Admitting: Family Medicine

## 2016-06-12 DIAGNOSIS — M5126 Other intervertebral disc displacement, lumbar region: Secondary | ICD-10-CM

## 2016-06-12 NOTE — Telephone Encounter (Signed)
Tiffany with [facility] called and left a voice mail message for a return call on this patient.  She did not leave a detailed message.  CB#[phone]/MW

## 2016-06-13 ENCOUNTER — Telehealth: Payer: Self-pay | Admitting: Family Medicine

## 2016-06-13 NOTE — Telephone Encounter (Signed)
FYI--Dr Chasnis declined referral

## 2016-06-13 NOTE — Telephone Encounter (Signed)
Also, can you please forward to Judson Roch. I don't know how to enter referral to Physiatry.

## 2016-06-13 NOTE — Telephone Encounter (Signed)
OK to refer to Dr. Sharlet Salina. Last oxycodone rx was written on 05-24-2016 and we only wrote enough for him to wean off it.

## 2016-06-13 NOTE — Telephone Encounter (Signed)
I spoke with Tiffany from [facility] who states patient called their office wanting to get joint injections by Dr. Sharlet Salina. Dr. Sharlet Salina was not going to do the injections because patient was on Oxycodone. Patient told Tiffany that he is no longer prescribed Oxycodone and will not be prescribed this in the future. Dr. Sharlet Salina is willing to do the injections since he no longer takes Oxycodone. Tiffiany says Dr. Sharlet Salina has been checking the controlled substance database to make sure patient has not been getting Oxycodone filled. Tiffany states patient needs a referral from our office to be seen by  Dr. Sharlet Salina [facility]) for [facility] Joint injections. Patient has Medicaid and the referral needs to come from his PCP office. Tiffany is aware that patient is being discharged from this practice.

## 2016-06-13 NOTE — Telephone Encounter (Signed)
Order placed for referral. Please schedule.referral

## 2016-06-19 ENCOUNTER — Ambulatory Visit: Payer: Self-pay | Admitting: Family Medicine

## 2016-06-20 ENCOUNTER — Telehealth: Payer: Self-pay | Admitting: Family Medicine

## 2016-06-20 ENCOUNTER — Encounter: Payer: Self-pay | Admitting: Family Medicine

## 2016-06-20 ENCOUNTER — Ambulatory Visit (INDEPENDENT_AMBULATORY_CARE_PROVIDER_SITE_OTHER): Payer: Medicare Other | Admitting: Family Medicine

## 2016-06-20 VITALS — BP 140/88 | HR 103 | Temp 98.1°F | Resp 16 | Wt 209.0 lb

## 2016-06-20 DIAGNOSIS — I10 Essential (primary) hypertension: Secondary | ICD-10-CM | POA: Diagnosis not present

## 2016-06-20 DIAGNOSIS — M5020 Other cervical disc displacement, unspecified cervical region: Secondary | ICD-10-CM

## 2016-06-20 DIAGNOSIS — G8929 Other chronic pain: Secondary | ICD-10-CM | POA: Diagnosis not present

## 2016-06-20 DIAGNOSIS — M5126 Other intervertebral disc displacement, lumbar region: Secondary | ICD-10-CM

## 2016-06-20 DIAGNOSIS — F11288 Opioid dependence with other opioid-induced disorder: Secondary | ICD-10-CM

## 2016-06-20 MED ORDER — OXYCODONE HCL 10 MG PO TABS: 10.0000 mg | ORAL_TABLET | Freq: Four times a day (QID) | ORAL | Status: DC | PRN

## 2016-06-20 MED ORDER — METOPROLOL TARTRATE 25 MG PO TABS: 25.0000 mg | ORAL_TABLET | Freq: Two times a day (BID) | ORAL | Status: DC
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
## 2016-06-20 NOTE — Telephone Encounter (Signed)
Maggie with [facility] is called to verify if the Rx for metoprolol tartrate (LOPRESSOR) 25 MG tablet is correct.  CB#[phone]/MW

## 2016-07-08 DIAGNOSIS — K7031 Alcoholic cirrhosis of liver with ascites: Secondary | ICD-10-CM | POA: Diagnosis not present

## 2016-07-08 DIAGNOSIS — B182 Chronic viral hepatitis C: Secondary | ICD-10-CM | POA: Diagnosis not present

## 2016-07-08 DIAGNOSIS — K744 Secondary biliary cirrhosis: Secondary | ICD-10-CM | POA: Diagnosis not present

## 2016-07-10 ENCOUNTER — Ambulatory Visit: Payer: Self-pay | Admitting: Psychiatry

## 2016-07-10 ENCOUNTER — Telehealth: Payer: Self-pay | Admitting: Family Medicine

## 2016-07-10 NOTE — Telephone Encounter (Signed)
FYI--Pt refused referral to [place]

## 2016-07-16 ENCOUNTER — Encounter: Payer: Self-pay | Admitting: Psychiatry

## 2016-07-16 ENCOUNTER — Ambulatory Visit (INDEPENDENT_AMBULATORY_CARE_PROVIDER_SITE_OTHER): Payer: Medicare Other | Admitting: Psychiatry

## 2016-07-16 VITALS — BP 159/88 | HR 88 | Temp 97.6°F | Ht 70.0 in | Wt 218.8 lb

## 2016-07-16 DIAGNOSIS — F331 Major depressive disorder, recurrent, moderate: Secondary | ICD-10-CM

## 2016-07-16 DIAGNOSIS — F5101 Primary insomnia: Secondary | ICD-10-CM | POA: Diagnosis not present

## 2016-07-16 MED ORDER — ESCITALOPRAM OXALATE 20 MG PO TABS: 20.0000 mg | ORAL_TABLET | Freq: Every day | ORAL | 3 refills | Status: DC

## 2016-07-16 MED ORDER — QUETIAPINE FUMARATE 200 MG PO TABS: 200.0000 mg | ORAL_TABLET | Freq: Every day | ORAL | 3 refills | Status: DC

## 2016-07-16 MED ORDER — LITHIUM CARBONATE ER 450 MG PO TBCR: 450.0000 mg | EXTENDED_RELEASE_TABLET | Freq: Every day | ORAL | 3 refills | Status: DC

## 2016-07-16 MED ORDER — PRAZOSIN HCL 5 MG PO CAPS: 5.0000 mg | ORAL_CAPSULE | Freq: Every day | ORAL | 3 refills | Status: DC

## 2016-07-16 MED ORDER — GABAPENTIN 300 MG PO CAPS: 300.0000 mg | ORAL_CAPSULE | Freq: Every day | ORAL | 3 refills | Status: DC
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
## 2016-07-17 ENCOUNTER — Ambulatory Visit: Payer: Self-pay | Admitting: Family Medicine

## 2016-07-18 ENCOUNTER — Encounter: Payer: Self-pay | Admitting: Family Medicine

## 2016-07-18 ENCOUNTER — Ambulatory Visit (INDEPENDENT_AMBULATORY_CARE_PROVIDER_SITE_OTHER): Payer: Medicare Other | Admitting: Family Medicine

## 2016-07-18 VITALS — BP 142/90 | HR 82 | Temp 98.0°F | Resp 16 | Wt 216.0 lb

## 2016-07-18 DIAGNOSIS — M5431 Sciatica, right side: Secondary | ICD-10-CM | POA: Diagnosis not present

## 2016-07-18 DIAGNOSIS — G8929 Other chronic pain: Secondary | ICD-10-CM | POA: Diagnosis not present

## 2016-07-18 DIAGNOSIS — M5020 Other cervical disc displacement, unspecified cervical region: Secondary | ICD-10-CM | POA: Diagnosis not present

## 2016-07-18 DIAGNOSIS — M5126 Other intervertebral disc displacement, lumbar region: Secondary | ICD-10-CM

## 2016-07-18 MED ORDER — OXYCODONE HCL 10 MG PO TABS: 10.0000 mg | ORAL_TABLET | Freq: Four times a day (QID) | ORAL | 0 refills | Status: DC | PRN

## 2016-07-18 MED ORDER — PREDNISONE 10 MG PO TABS: ORAL_TABLET | ORAL | 0 refills | Status: AC

## 2016-07-18 NOTE — Patient Instructions (Signed)

## 2016-07-18 NOTE — Progress Notes (Signed)
Patient: Ray Robles Male    DOB: Sep 12, 1961   55 y.o.   MRN: [id number] Visit Date: 07/18/2016  Today's Provider: Lelon Huh, MD   Chief Complaint  Patient presents with  . Pain    follow up   Subjective:    HPI Follow up Chronic pain: Patient was last seen 06/20/2016 for chonic back pain secondary to lumbar and cervical disk disease. Changes made during that visit includes reducing dose of Oxycodone to 80mg /day. He is here today with his wife for follow up . At last visit we initiated referral to pain clinic in [place], but he and his wife report that they do not have reliable transportation to travel that far.  He has been having pain in his right lower back radiating down back of right leg for several weeks, but has become much worse the last few weeks. Pain gets much worse when walking and states he thinks he might need a wheelchair. He can't tell if the leg is weak or if strength is just limited due to pain.   We also initiated referral to Dr. Sharlet Salina at last visit to consider steroid injections, but referral was refused by Dr. Sharlet Salina.     Allergies  Allergen Reactions  . Ambien  [Zolpidem Tartrate]     Violent sleep  . Ibuprofen Other (See Comments)    Can't take because of liver  . Morphine Nausea Only, Nausea And Vomiting and Other (See Comments)    Other reaction(s): Vomiting N&V, Diarrhea  . Morphine And Related Nausea And Vomiting    N&V, Diarrhea  . Tylenol [Acetaminophen] Other (See Comments)    Can't take because of liver  . Xanax [Alprazolam] Other (See Comments)    Brings the ammonia levels up  . Zolpidem Other (See Comments)    Violent Sleep   No outpatient prescriptions have been marked as taking for the 07/18/16 encounter (Office Visit) with Birdie Sons, MD.    Review of Systems  Constitutional: Positive for fatigue. Negative for appetite change, chills and fever.  Respiratory: Positive for shortness of breath. Negative for chest  tightness and wheezing.   Cardiovascular: Negative for chest pain and palpitations.  Gastrointestinal: Positive for abdominal distention. Negative for abdominal pain, nausea and vomiting.  Musculoskeletal: Positive for back pain, gait problem and myalgias.  Neurological: Positive for numbness.    Social History  Substance Use Topics  . Smoking status: Never Smoker  . Smokeless tobacco: Never Used  . Alcohol use No   Objective:   BP (!) 142/90 (BP Location: Left Arm, Patient Position: Sitting, Cuff Size: Large)   Pulse 82   Temp 98 F (36.7 C) (Oral)   Resp 16   Wt 216 lb (98 kg)   BMI 30.99 kg/m   Physical Exam   General Appearance:    Alert, cooperative, no distress  Eyes:    PERRL, conjunctiva/corneas clear, EOM's intact       Lungs:     Clear to auscultation bilaterally, respirations unlabored  Heart:    Regular rate and rhythm  Neurologic:   Awake, alert, oriented x 3. No apparent focal neurological           defect.   MS:   Moderate tenderness to right of lumbar spine. Tender over cervical spine. Slight tenderness directly over lumbar spine. MS +5/5. Positive straight leg test. No s/s deficits. DTR LE +2 bilaterally and symmetric.        Assessment &  Plan:     1. Chronic pain Has significantly reduced opioid use. I think he would benefit from therapeutic injections and will hopefully enable him to further reduce opoiids, but Dr. Sharlet Salina refused to see patient and patient does not have reliable transportation to get to pain clinic at [facility]. Will initiate referral to [facility] pain clinic.   - Pain [facility]gt [facility] ([facility]), Ur - Oxycodone HCl 10 MG TABS; Take 1-2 tablets (10-20 mg total) by mouth every 6 (six) hours as needed (no more than eight in one day).  Dispense: 240 tablet; Refill: 0  2. Sciatica, right side New. Start 12 day prednisone. MRI is not quickly improving.  - Oxycodone HCl 10 MG TABS; Take 1-2 tablets (10-20 mg total) by mouth every 6 (six) hours as needed  (no more than eight in one day).  Dispense: 240 tablet; Refill: 0  3. Herniated cervical disc  - Oxycodone HCl 10 MG TABS; Take 1-2 tablets (10-20 mg total) by mouth every 6 (six) hours as needed (no more than eight in one day).  Dispense: 240 tablet; Refill: 0  4. Lumbar disc herniation  - Oxycodone HCl 10 MG TABS; Take 1-2 tablets (10-20 mg total) by mouth every 6 (six) hours as needed (no more than eight in one day).  Dispense: 240 tablet; Refill: 0  Follow up in 1 month.       Lelon Huh, MD  [facility]

## 2016-07-19 LAB — PAIN MGT SCRN (14 DRUGS), UR
Amphetamine Screen, Ur: NEGATIVE ng/mL
BENZODIAZEPINE SCREEN, URINE: NEGATIVE ng/mL
BUPRENORPHINE, URINE: POSITIVE ng/mL
Barbiturate Screen, Ur: NEGATIVE ng/mL
COCAINE(METAB.) SCREEN, URINE: NEGATIVE ng/mL
CREATININE(CRT), U: 82.9 mg/dL (ref 20.0–300.0)
Cannabinoids Ur Ql Scn: POSITIVE ng/mL
FENTANYL, URINE: NEGATIVE pg/mL
MEPERIDINE SCREEN, URINE: NEGATIVE ng/mL
Methadone Scn, Ur: NEGATIVE ng/mL
OPIATE SCRN UR: POSITIVE ng/mL
OXYCODONE+OXYMORPHONE UR QL SCN: POSITIVE ng/mL
PCP Scrn, Ur: NEGATIVE ng/mL
PROPOXYPHENE SCREEN: NEGATIVE ng/mL
Ph of Urine: 6.8 (ref 4.5–8.9)
Tramadol Ur Ql Scn: NEGATIVE ng/mL

## 2016-07-25 ENCOUNTER — Encounter: Payer: Self-pay | Admitting: Family Medicine

## 2016-08-13 ENCOUNTER — Other Ambulatory Visit: Payer: Self-pay | Admitting: Family Medicine

## 2016-08-13 DIAGNOSIS — M5126 Other intervertebral disc displacement, lumbar region: Secondary | ICD-10-CM

## 2016-08-13 DIAGNOSIS — M5020 Other cervical disc displacement, unspecified cervical region: Secondary | ICD-10-CM

## 2016-08-13 DIAGNOSIS — G8929 Other chronic pain: Secondary | ICD-10-CM

## 2016-08-13 NOTE — Telephone Encounter (Signed)
Pt contacted office for refill request on the following medications:  Oxycodone HCl 10 MG TABS.  [id number]

## 2016-08-15 MED ORDER — OXYCODONE HCL 10 MG PO TABS: 10.0000 mg | ORAL_TABLET | Freq: Four times a day (QID) | ORAL | 0 refills | Status: DC | PRN

## 2016-08-15 NOTE — Telephone Encounter (Signed)
Pt called to see if RX was ready for pick up. Please advise. Thanks TNP °

## 2016-08-15 NOTE — Telephone Encounter (Signed)
Prescription has been printed and is ready for pick up. Per Dr. [name], the medication has been reduced due to patient failing his urine drug screen. Patient advised and verbally voiced understanding.

## 2016-08-16 ENCOUNTER — Encounter: Payer: Self-pay | Admitting: Emergency Medicine

## 2016-08-16 ENCOUNTER — Emergency Department
Admission: EM | Admit: 2016-08-16 | Discharge: 2016-08-16 | Disposition: A | Discharge status: Home / Self Care | Payer: Medicare Other | Attending: Student in an Organized Health Care Education/Training Program | Admitting: Student in an Organized Health Care Education/Training Program

## 2016-08-16 ENCOUNTER — Ambulatory Visit: Payer: Self-pay | Admitting: Family Medicine

## 2016-08-16 DIAGNOSIS — M5440 Lumbago with sciatica, unspecified side: Secondary | ICD-10-CM | POA: Diagnosis not present

## 2016-08-16 DIAGNOSIS — M5430 Sciatica, unspecified side: Secondary | ICD-10-CM | POA: Diagnosis not present

## 2016-08-16 DIAGNOSIS — Z79899 Other long term (current) drug therapy: Secondary | ICD-10-CM | POA: Diagnosis not present

## 2016-08-16 DIAGNOSIS — I10 Essential (primary) hypertension: Secondary | ICD-10-CM | POA: Diagnosis not present

## 2016-08-16 DIAGNOSIS — M549 Dorsalgia, unspecified: Secondary | ICD-10-CM | POA: Diagnosis present

## 2016-08-16 LAB — COMPREHENSIVE METABOLIC PANEL
ALBUMIN: 4.5 g/dL (ref 3.5–5.0)
ALK PHOS: 93 U/L (ref 38–126)
ALT: 21 U/L (ref 17–63)
ANION GAP: 9 (ref 5–15)
AST: 25 U/L (ref 15–41)
BILIRUBIN TOTAL: 0.7 mg/dL (ref 0.3–1.2)
BUN: 22 mg/dL — AB (ref 6–20)
CALCIUM: 9.1 mg/dL (ref 8.9–10.3)
CO2: 26 mmol/L (ref 22–32)
Chloride: 100 mmol/L — ABNORMAL LOW (ref 101–111)
Creatinine, Ser: 1.56 mg/dL — ABNORMAL HIGH (ref 0.61–1.24)
GFR calc Af Amer: 56 mL/min — ABNORMAL LOW (ref >60)
GFR, EST NON AFRICAN AMERICAN: 48 mL/min — AB (ref >60)
GLUCOSE: 144 mg/dL — AB (ref 65–99)
Potassium: 4.5 mmol/L (ref 3.5–5.1)
Sodium: 135 mmol/L (ref 135–145)
TOTAL PROTEIN: 8.3 g/dL — AB (ref 6.5–8.1)

## 2016-08-16 LAB — CBC WITH DIFFERENTIAL/PLATELET
BASOS PCT: 1 %
Basophils Absolute: 0.1 10*3/uL (ref 0–0.1)
EOS PCT: 3 %
Eosinophils Absolute: 0.3 10*3/uL (ref 0–0.7)
HCT: 38.5 % — ABNORMAL LOW (ref 40.0–52.0)
Hemoglobin: 13.5 g/dL (ref 13.0–18.0)
Lymphocytes Relative: 29 %
Lymphs Abs: 2.8 10*3/uL (ref 1.0–3.6)
MCH: 30.5 pg (ref 26.0–34.0)
MCHC: 35.1 g/dL (ref 32.0–36.0)
MCV: 86.9 fL (ref 80.0–100.0)
MONO ABS: 0.6 10*3/uL (ref 0.2–1.0)
Monocytes Relative: 7 %
Neutro Abs: 5.7 10*3/uL (ref 1.4–6.5)
Neutrophils Relative %: 60 %
PLATELETS: 159 10*3/uL (ref 150–440)
RBC: 4.43 MIL/uL (ref 4.40–5.90)
RDW: 13.8 % (ref 11.5–14.5)
WBC: 9.5 10*3/uL (ref 3.8–10.6)

## 2016-08-16 LAB — AMMONIA: AMMONIA: 32 umol/L (ref 9–35)

## 2016-08-16 MED ORDER — HYDROMORPHONE HCL 1 MG/ML IJ SOLN
1.0000 mg | Freq: Once | INTRAMUSCULAR | Status: AC
  Administered 2016-08-16: 1 mg via INTRAVENOUS
  Filled 2016-08-16: qty 1

## 2016-08-16 MED ORDER — ZALEPLON 10 MG PO CAPS: 10.0000 mg | ORAL_CAPSULE | Freq: Every evening | ORAL | 0 refills | Status: DC | PRN

## 2016-08-16 MED ORDER — DIAZEPAM 5 MG/ML IJ SOLN
5.0000 mg | Freq: Once | INTRAMUSCULAR | Status: AC
  Administered 2016-08-16: 5 mg via INTRAVENOUS
  Filled 2016-08-16: qty 2

## 2016-08-16 NOTE — ED Triage Notes (Signed)
Pt with sciatica pain for a couple of mths. Has been seen for same before.

## 2016-08-16 NOTE — ED Provider Notes (Signed)
[facility] Emergency Department Provider Note  ____________________________________________  Time seen: Approximately 11:03 AM  I have reviewed the triage vital signs and the nursing notes.   HISTORY  Chief Complaint Sciatica    HPI Ray Robles is a 55 y.o. male who presents for a long-standing history of sciatica. Currently sees his PCP is maintaining his medications but is having breakthrough day and desires something up with a breakthrough and help him sleep.   Past Medical History:  Diagnosis Date  . H/O bacterial pneumonia 11/11/2007   2008 and 2015   . Heart murmur   . Hepatitis C   . Hx of endoscopy    05/12/2014- [facility]. Dr Candace Cruise. Benign appearing stricture. Dilated the examination was otherwise normal.02/13/2012- Beniign appearing intrinsic mild stenosis at GE junction, successfully dialated  . Liver disease     Patient Active Problem List   Diagnosis Date Noted  . Sciatica, right side 07/18/2016  . Actinic keratosis 01/29/2016  . Cirrhosis, non-alcoholic ([place]) 0000000  . Abnormal LFTs 05/12/2015  . Ache in joint 05/12/2015  . Arthritis of knee 05/12/2015  . Chronic pain 05/12/2015  . Dysphagia 05/12/2015  . ED (erectile dysfunction) of organic origin 05/12/2015  . Hypokalemia 05/12/2015  . Positive urine drug screen 05/12/2015  . Renal function impairment 05/12/2015  . Hernia, inguinal, right 05/12/2015  . Seborrheic keratosis 05/12/2015  . Hepatic encephalopathy ([place])   . Hypertension   . Sleep apnea   . Anxiety   . Benign hypertension 04/01/2015  . Arthritis 03/22/2015  . BP (high blood pressure) 03/22/2015  . Migraine with aura 02/09/2010  . Prediabetes 08/03/2008  . Herniated cervical disc 07/07/2007  . Lumbar disc herniation 07/07/2007  . Insomnia 06/10/2007  . Ascites 05/06/2007  . Esophageal reflux 04/01/2005  . Depression, major, recurrent, moderate ([place]) 12/02/1998  . Neuropathy, peripheral, autonomic,  idiopathic 12/02/1998  . Hepatitis C 12/02/1988    Past Surgical History:  Procedure Laterality Date  . COLONOSCOPY WITH PROPOFOL N/A 05/12/2015   Procedure: COLONOSCOPY WITH PROPOFOL;  Surgeon: Hulen Luster, MD;  Location: [facility] ENDOSCOPY;  Service: Gastroenterology;  Laterality: N/A;  . HERNIA REPAIR  08/2012   Dr. Jamal Collin; [facility]    Prior to Admission medications   Medication Sig Start Date End Date Taking? Authorizing Provider  diphenoxylate-atropine (LOMOTIL) 2.5-0.025 MG per tablet Take 2 tablets by mouth every 6 (six) hours as needed. For diarrhea 02/24/15   Historical Provider, MD  escitalopram (LEXAPRO) 20 MG tablet Take 1 tablet (20 mg total) by mouth daily. 07/16/16   Rainey Pines, MD  furosemide (LASIX) 20 MG tablet Take 20 mg by mouth daily.    Historical Provider, MD  gabapentin (NEURONTIN) 300 MG capsule Take 1 capsule (300 mg total) by mouth at bedtime. 07/16/16 07/16/17  Rainey Pines, MD  hyoscyamine (ANASPAZ) 0.125 MG TBDP disintergrating tablet Place 0.125 mg under the tongue every 4 (four) hours as needed for bladder spasms or cramping.    Historical Provider, MD  lactulose (CHRONULAC) 10 GM/15ML solution Take 30 mLs by mouth 3 (three) times daily. 03/19/15   Historical Provider, MD  lithium carbonate (ESKALITH) 450 MG CR tablet Take 1 tablet (450 mg total) by mouth at bedtime. 07/16/16   Rainey Pines, MD  metoprolol tartrate (LOPRESSOR) 25 MG tablet Take 1 tablet (25 mg total) by mouth 2 (two) times daily. 06/20/16   Birdie Sons, MD  nitroGLYCERIN (NITROSTAT) 0.4 MG SL tablet Place 0.4 mg under the tongue every 5 (  five) minutes as needed for chest pain.    Historical Provider, MD  nystatin cream (MYCOSTATIN) Apply 1 application topically 3 (three) times daily as needed.    Historical Provider, MD  omeprazole (PRILOSEC) 20 MG capsule Take 2 capsules (40 mg total) by mouth daily. 09/08/15   Birdie Sons, MD  ondansetron (ZOFRAN-ODT) 4 MG disintegrating tablet Take 4 mg by mouth every  8 (eight) hours as needed for nausea or vomiting.    Historical Provider, MD  Oxycodone HCl 10 MG TABS Take 1 tablet (10 mg total) by mouth every 6 (six) hours as needed. No more than 4 tablets in one day 08/15/16   Birdie Sons, MD  prazosin (MINIPRESS) 5 MG capsule Take 1 capsule (5 mg total) by mouth at bedtime. 07/16/16   Rainey Pines, MD  QUEtiapine (SEROQUEL) 200 MG tablet Take 1 tablet (200 mg total) by mouth at bedtime. 07/16/16   Rainey Pines, MD  sildenafil (REVATIO) 20 MG tablet Take 2-3 tablets by mouth as needed. No more than 3 in a day 04/04/15   Historical Provider, MD  spironolactone (ALDACTONE) 50 MG tablet Take 1 tablet (50 mg total) by mouth daily. 03/27/16   Birdie Sons, MD  zaleplon (SONATA) 10 MG capsule Take 1 capsule (10 mg total) by mouth at bedtime as needed for sleep. 08/16/16   Arlyss Repress, PA-C    Allergies Ambien  [zolpidem tartrate]; Ibuprofen; Morphine; Morphine and related; Tylenol [acetaminophen]; Xanax [alprazolam]; and Zolpidem  Family History  Problem Relation Age of Onset  . Cancer Mother   . Cancer Father   . Alcohol abuse Brother   . Alcohol abuse Brother   . Drug abuse Brother     Social History Social History  Substance Use Topics  . Smoking status: Never Smoker  . Smokeless tobacco: Never Used  . Alcohol use No    Review of Systems Constitutional: No fever/chills Musculoskeletal: Positive for severe back pain. Skin: Negative for rash. Neurological: Negative for headaches, focal weakness or numbness.  10-point ROS otherwise negative.  ____________________________________________   PHYSICAL EXAM:  VITAL SIGNS: ED Triage Vitals  Enc Vitals Group     BP 08/16/16 1018 (!) 122/102     Pulse Rate 08/16/16 1018 73     Resp 08/16/16 1018 17     Temp 08/16/16 1018 98.1 F (36.7 C)     Temp Source 08/16/16 1018 Oral     SpO2 08/16/16 1018 99 %     Weight 08/16/16 1019 212 lb (96.2 kg)     Height 08/16/16 1019 5\' 10"  (1.778 m)      Head Circumference --      Peak Flow --      Pain Score 08/16/16 1021 10     Pain Loc --      Pain Edu? --      Excl. in [place]? --     Constitutional: Alert and oriented. Well appearing and in Moderate distress. Patient unable to sit comfortably for any long period time. Cardiovascular: Normal rate, regular rhythm. Grossly normal heart sounds.  Good peripheral circulation. Respiratory: Normal respiratory effort.  No retractions. Lungs CTAB. Gastrointestinal: Soft and nontender. No distention.  No CVA tenderness. Musculoskeletal: Lumbar spinal tenderness straight leg raise positive bilaterally. Distally neurovascularly intact. Neurologic:  Normal speech and language. No gross focal neurologic deficits are appreciated. No gait instability. Skin:  Skin is warm, dry and intact. No rash noted. Psychiatric: Mood and affect are normal. Speech and  behavior are normal.  ____________________________________________   LABS (all labs ordered are listed, but only abnormal results are displayed)  Labs Reviewed  COMPREHENSIVE METABOLIC PANEL - Abnormal; Notable for the following:       Result Value   Chloride 100 (*)    Glucose, Bld 144 (*)    BUN 22 (*)    Creatinine, Ser 1.56 (*)    Total Protein 8.3 (*)    GFR calc non Af Amer 48 (*)    GFR calc Af Amer 56 (*)    All other components within normal limits  CBC WITH DIFFERENTIAL/PLATELET - Abnormal; Notable for the following:    HCT 38.5 (*)    All other components within normal limits  AMMONIA   ____________________________________________  EKG   ____________________________________________  RADIOLOGY   ____________________________________________   PROCEDURES  Procedure(s) performed: None  Critical Care performed: No  ____________________________________________   INITIAL IMPRESSION / ASSESSMENT AND PLAN / ED COURSE  Pertinent labs & imaging results that were available during my care of the patient were reviewed by me  and considered in my medical decision making (see chart for details). Review of the [place] CSRS was performed in accordance of the [place] prior to dispensing any controlled drugs.  Acute exacerbation of sciatica. No elevation of ammonia levels secondary to hepatic encephalopathy history. Patient follow-up with his PCP or return to ER with any worsening symptomology.  Clinical Course    ____________________________________________   FINAL CLINICAL IMPRESSION(S) / ED DIAGNOSES  Final diagnoses:  Sciatica, unspecified laterality     This chart was dictated using voice recognition software/Dragon. Despite best efforts to proofread, errors can occur which can change the meaning. Any change was purely unintentional.    Arlyss Repress, PA-C 08/16/16 [place], MD 08/16/16 1447

## 2016-08-19 ENCOUNTER — Encounter: Payer: Self-pay | Admitting: Family Medicine

## 2016-08-19 ENCOUNTER — Ambulatory Visit (INDEPENDENT_AMBULATORY_CARE_PROVIDER_SITE_OTHER): Payer: Medicare Other | Admitting: Family Medicine

## 2016-08-19 VITALS — BP 150/80 | HR 70 | Temp 98.6°F | Resp 16 | Ht 70.0 in | Wt 218.0 lb

## 2016-08-19 DIAGNOSIS — E119 Type 2 diabetes mellitus without complications: Secondary | ICD-10-CM | POA: Diagnosis not present

## 2016-08-19 DIAGNOSIS — Z23 Encounter for immunization: Secondary | ICD-10-CM | POA: Diagnosis not present

## 2016-08-19 DIAGNOSIS — Z0189 Encounter for other specified special examinations: Secondary | ICD-10-CM | POA: Diagnosis not present

## 2016-08-19 DIAGNOSIS — M5410 Radiculopathy, site unspecified: Secondary | ICD-10-CM

## 2016-08-19 DIAGNOSIS — Z0283 Encounter for blood-alcohol and blood-drug test: Secondary | ICD-10-CM

## 2016-08-19 MED ORDER — PREDNISONE 20 MG PO TABS: ORAL_TABLET | ORAL | 0 refills | Status: DC

## 2016-08-19 NOTE — Progress Notes (Signed)
Patient: Ray Robles Male    DOB: Jul 12, 1961   55 y.o.   MRN: [id number] Visit Date: 08/19/2016  Today's Provider: Lelon Huh, MD   Chief Complaint  Patient presents with  . Follow-up   Subjective:    HPI    Chronic pain: From 07/18/2016- UDS was again positive for buprenorphine. Patient previously admitted to taking one of his sister in laws Suboxone, but denies taking any prior to UDS in August. He does admit to occasional cannabinoid use to help with pain control and nausea.    Of note is that since July, we have reduced prescription oxycodone from 450x15mg  oxycodone per month to 240 x 10mg  a month, to 120 x 10mg  on most recent prescription written 08/15/16  Sciatica, right side:  From 07/18/2016-New. Start 12 day prednisone. He states pain improved at high dose prednisone, but would flare up with movement. Since finishing prednisone, pain is worse now than ever. Pain is constant radiating down back and side of right leg. He states like he feels like it may be time for surgery.    Follow up ER visit  Patient was seen in ER for sciatica  on 08/16/2016. He was treated for back pian . Treatment for this included patient was prescribed Zoleplon to help sleep and advised to follow-up with pcp. He was also given prescription for dilauded which he states was helpful.  He reports good compliance with treatment. He reports this condition is Unchanged.  ----------------------------------------------------------------  He is here with his wife today who reports he has been pretty much home bound for the last month due to pain. She denies he gets any visitors who could supply him with illicit drugs and they both deny any known consumption of Suboxone.   He has been scheduled to see Dr. Chinita Greenland at pain clinic in [place] next week.    Allergies  Allergen Reactions  . Ambien  [Zolpidem Tartrate]     Violent sleep  . Ibuprofen Other (See Comments)    Can't take because  of liver  . Morphine Nausea Only, Nausea And Vomiting and Other (See Comments)    Other reaction(s): Vomiting N&V, Diarrhea  . Morphine And Related Nausea And Vomiting    N&V, Diarrhea  . Tylenol [Acetaminophen] Other (See Comments)    Can't take because of liver  . Xanax [Alprazolam] Other (See Comments)    Brings the ammonia levels up  . Zolpidem Other (See Comments)    Violent Sleep     Current Outpatient Prescriptions:  .  diphenoxylate-atropine (LOMOTIL) 2.5-0.025 MG per tablet, Take 2 tablets by mouth every 6 (six) hours as needed. For diarrhea, Disp: , Rfl:  .  escitalopram (LEXAPRO) 20 MG tablet, Take 1 tablet (20 mg total) by mouth daily., Disp: 30 tablet, Rfl: 3 .  furosemide (LASIX) 20 MG tablet, Take 20 mg by mouth daily., Disp: , Rfl:  .  gabapentin (NEURONTIN) 300 MG capsule, Take 1 capsule (300 mg total) by mouth at bedtime., Disp: 30 capsule, Rfl: 3 .  hyoscyamine (ANASPAZ) 0.125 MG TBDP disintergrating tablet, Place 0.125 mg under the tongue every 4 (four) hours as needed for bladder spasms or cramping., Disp: , Rfl:  .  lactulose (CHRONULAC) 10 GM/15ML solution, Take 30 mLs by mouth 3 (three) times daily., Disp: , Rfl: 0 .  lithium carbonate (ESKALITH) 450 MG CR tablet, Take 1 tablet (450 mg total) by mouth at bedtime., Disp: 30 tablet, Rfl: 3 .  metoprolol  tartrate (LOPRESSOR) 25 MG tablet, Take 1 tablet (25 mg total) by mouth 2 (two) times daily., Disp: 30 tablet, Rfl: 12 .  nitroGLYCERIN (NITROSTAT) 0.4 MG SL tablet, Place 0.4 mg under the tongue every 5 (five) minutes as needed for chest pain., Disp: , Rfl:  .  nystatin cream (MYCOSTATIN), Apply 1 application topically 3 (three) times daily as needed., Disp: , Rfl:  .  omeprazole (PRILOSEC) 20 MG capsule, Take 2 capsules (40 mg total) by mouth daily., Disp: 60 capsule, Rfl: 12 .  ondansetron (ZOFRAN-ODT) 4 MG disintegrating tablet, Take 4 mg by mouth every 8 (eight) hours as needed for nausea or vomiting., Disp: ,  Rfl:  .  Oxycodone HCl 10 MG TABS, Take 1 tablet (10 mg total) by mouth every 6 (six) hours as needed. No more than 4 tablets in one day, Disp: 120 tablet, Rfl: 0 .  prazosin (MINIPRESS) 5 MG capsule, Take 1 capsule (5 mg total) by mouth at bedtime., Disp: 30 capsule, Rfl: 3 .  QUEtiapine (SEROQUEL) 200 MG tablet, Take 1 tablet (200 mg total) by mouth at bedtime., Disp: 30 tablet, Rfl: 3 .  sildenafil (REVATIO) 20 MG tablet, Take 2-3 tablets by mouth as needed. No more than 3 in a day, Disp: , Rfl:  .  spironolactone (ALDACTONE) 50 MG tablet, Take 1 tablet (50 mg total) by mouth daily., Disp: 30 tablet, Rfl: 6 .  zaleplon (SONATA) 10 MG capsule, Take 1 capsule (10 mg total) by mouth at bedtime as needed for sleep., Disp: 10 capsule, Rfl: 0  Review of Systems  Constitutional: Negative for appetite change, chills and fever.  Respiratory: Negative for chest tightness, shortness of breath and wheezing.   Cardiovascular: Negative for chest pain and palpitations.  Gastrointestinal: Negative for abdominal pain, nausea and vomiting.  Musculoskeletal: Positive for back pain and gait problem.    Social History  Substance Use Topics  . Smoking status: Never Smoker  . Smokeless tobacco: Never Used  . Alcohol use No   Objective:   BP (!) 150/80 (BP Location: Left Arm, Patient Position: Sitting, Cuff Size: Large)   Pulse 70   Temp 98.6 F (37 C) (Oral)   Resp 16   Ht 5\' 10"  (1.778 m)   Wt 218 lb (98.9 kg)   SpO2 99%   BMI 31.28 kg/m   Physical Exam   General Appearance:    Alert, cooperative, no distress  Eyes:    PERRL, conjunctiva/corneas clear, EOM's intact       Lungs:     Clear to auscultation bilaterally, respirations unlabored  Heart:    Regular rate and rhythm  Neurologic:   Awake, alert, oriented x 3. No apparent focal neurological           defect.          Assessment & Plan:     1. Encounter for drug screening He emphatically denies recent use of buprenorphine or any  other prescriptions he is not prescribed. He does admit to cannabinoid use to aid in pain control. He is scheduled for initial consultation at pain clinic next week to consider non-pharmacological pain treatemnts, but we will also obtain updated MRI to make sure he doesn't need surgical consultation.   - Pain Mgt Scrn (14 Drugs), Ur  2. Back pain with right-sided radiculopathy  - MR Lumbar Spine Wo Contrast; Future - Flu Vaccine QUAD 36+ mos IM  3. Need for influenza vaccination        Elenore Rota  [name], MD  [facility]

## 2016-08-20 ENCOUNTER — Telehealth: Payer: Self-pay | Admitting: *Deleted

## 2016-08-20 LAB — PAIN MGT SCRN (14 DRUGS), UR
AMPHETAMINE SCRN UR: NEGATIVE ng/mL
BARBITURATE SCRN UR: NEGATIVE ng/mL
BENZODIAZEPINE SCREEN, URINE: NEGATIVE ng/mL
Buprenorphine, Urine: NEGATIVE ng/mL
COCAINE(METAB.) SCREEN, URINE: NEGATIVE ng/mL
Cannabinoids Ur Ql Scn: POSITIVE ng/mL
Creatinine(Crt), U: 61.8 mg/dL (ref 20.0–300.0)
FENTANYL, URINE: NEGATIVE pg/mL
MEPERIDINE SCREEN, URINE: NEGATIVE ng/mL
METHADONE SCREEN, URINE: NEGATIVE ng/mL
Opiate Scrn, Ur: POSITIVE ng/mL
Oxycodone+Oxymorphone Ur Ql Scn: POSITIVE ng/mL
PCP Scrn, Ur: NEGATIVE ng/mL
Ph of Urine: 5.6 (ref 4.5–8.9)
Propoxyphene, Screen: NEGATIVE ng/mL
TRAMADOL UR QL SCN: POSITIVE ng/mL

## 2016-08-20 NOTE — Telephone Encounter (Signed)
-----   Message from Birdie Sons, MD sent at 08/19/2016  4:58 PM EDT ----- Regarding: zelepton prescription Can you please call [facility] regarding prescripton zalepton that was filled on Friday. Patient states pharmacy was going to send over some form for me to fill out for him to get reimbursed for prescription, but I have no idea what they are talking about.

## 2016-08-20 NOTE — Telephone Encounter (Signed)
Called pharmacy who stated that medication zaleplon needs a PA. Pharmacy is faxing PA to office.

## 2016-08-27 DIAGNOSIS — M5020 Other cervical disc displacement, unspecified cervical region: Secondary | ICD-10-CM | POA: Diagnosis not present

## 2016-08-27 DIAGNOSIS — G8929 Other chronic pain: Secondary | ICD-10-CM | POA: Diagnosis not present

## 2016-08-27 DIAGNOSIS — M5126 Other intervertebral disc displacement, lumbar region: Secondary | ICD-10-CM | POA: Diagnosis not present

## 2016-08-30 ENCOUNTER — Ambulatory Visit
Admission: RE | Admit: 2016-08-30 | Discharge: 2016-08-30 | Disposition: A | Discharge status: Home / Self Care | Payer: Medicare Other | Source: Ambulatory Visit | Attending: Family Medicine | Admitting: Family Medicine

## 2016-08-30 DIAGNOSIS — M5410 Radiculopathy, site unspecified: Secondary | ICD-10-CM

## 2016-08-30 DIAGNOSIS — M5127 Other intervertebral disc displacement, lumbosacral region: Secondary | ICD-10-CM | POA: Insufficient documentation

## 2016-08-30 DIAGNOSIS — M5126 Other intervertebral disc displacement, lumbar region: Secondary | ICD-10-CM | POA: Diagnosis not present

## 2016-09-04 NOTE — Telephone Encounter (Signed)
Pt wife Tye Maryland is returning call.  [phone]

## 2016-09-05 ENCOUNTER — Telehealth: Payer: Self-pay | Admitting: *Deleted

## 2016-09-05 DIAGNOSIS — M5410 Radiculopathy, site unspecified: Secondary | ICD-10-CM

## 2016-09-05 NOTE — Telephone Encounter (Signed)
Patient's wife was notified of results. Expressed understanding. 

## 2016-09-05 NOTE — Telephone Encounter (Signed)
-----   Message from Birdie Sons, MD sent at 09/03/2016  8:03 AM EDT ----- MRI shows moderate sized herniated disk compressing S1 nerve root. This is probably causing his leg pain. Needs referral to neurosurgery. Needs to schedule follow up here in 4-6 weeks.

## 2016-09-05 NOTE — Telephone Encounter (Signed)
Please schedule referral to neurosurgery. Thanks!

## 2016-09-18 ENCOUNTER — Other Ambulatory Visit: Payer: Self-pay | Admitting: Family Medicine

## 2016-09-18 DIAGNOSIS — M5020 Other cervical disc displacement, unspecified cervical region: Secondary | ICD-10-CM

## 2016-09-18 DIAGNOSIS — M5126 Other intervertebral disc displacement, lumbar region: Secondary | ICD-10-CM

## 2016-09-18 MED ORDER — OXYCODONE HCL 10 MG PO TABS: 10.0000 mg | ORAL_TABLET | Freq: Four times a day (QID) | ORAL | 0 refills | Status: DC | PRN

## 2016-09-18 NOTE — Telephone Encounter (Signed)
Pt needs refill oxycodone.  Please call when ready.    Pt is out of the medication  Thank sTeri

## 2016-10-02 ENCOUNTER — Other Ambulatory Visit: Payer: Self-pay | Admitting: Family Medicine

## 2016-10-02 DIAGNOSIS — K219 Gastro-esophageal reflux disease without esophagitis: Secondary | ICD-10-CM

## 2016-10-03 DIAGNOSIS — M5417 Radiculopathy, lumbosacral region: Secondary | ICD-10-CM | POA: Diagnosis not present

## 2016-10-03 DIAGNOSIS — M5416 Radiculopathy, lumbar region: Secondary | ICD-10-CM | POA: Diagnosis not present

## 2016-10-09 ENCOUNTER — Other Ambulatory Visit: Payer: Self-pay | Admitting: *Deleted

## 2016-10-10 MED ORDER — ZALEPLON 10 MG PO CAPS: 10.0000 mg | ORAL_CAPSULE | Freq: Every evening | ORAL | 0 refills | Status: DC | PRN

## 2016-10-10 NOTE — Telephone Encounter (Signed)
Please call in zalepion

## 2016-10-10 NOTE — Telephone Encounter (Signed)
Rx called in to pharmacy. 

## 2016-10-15 ENCOUNTER — Encounter: Payer: Self-pay | Admitting: Psychiatry

## 2016-10-15 ENCOUNTER — Ambulatory Visit (INDEPENDENT_AMBULATORY_CARE_PROVIDER_SITE_OTHER): Payer: Medicare Other | Admitting: Psychiatry

## 2016-10-15 VITALS — BP 147/83 | HR 72 | Temp 97.4°F | Resp 15 | Wt 223.4 lb

## 2016-10-15 DIAGNOSIS — F331 Major depressive disorder, recurrent, moderate: Secondary | ICD-10-CM

## 2016-10-15 DIAGNOSIS — F5101 Primary insomnia: Secondary | ICD-10-CM | POA: Diagnosis not present

## 2016-10-15 MED ORDER — PRAZOSIN HCL 5 MG PO CAPS: 5.0000 mg | ORAL_CAPSULE | Freq: Every day | ORAL | 3 refills | Status: DC

## 2016-10-15 MED ORDER — ESCITALOPRAM OXALATE 20 MG PO TABS: 20.0000 mg | ORAL_TABLET | Freq: Every day | ORAL | 3 refills | Status: DC

## 2016-10-15 MED ORDER — QUETIAPINE FUMARATE 200 MG PO TABS: 300.0000 mg | ORAL_TABLET | Freq: Every day | ORAL | 3 refills | Status: DC

## 2016-10-15 MED ORDER — GABAPENTIN 300 MG PO CAPS: 300.0000 mg | ORAL_CAPSULE | Freq: Every day | ORAL | 3 refills | Status: DC

## 2016-10-15 MED ORDER — LITHIUM CARBONATE ER 450 MG PO TBCR: 450.0000 mg | EXTENDED_RELEASE_TABLET | Freq: Every day | ORAL | 3 refills | Status: DC
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
## 2016-10-16 ENCOUNTER — Telehealth: Payer: Self-pay | Admitting: Family Medicine

## 2016-10-16 DIAGNOSIS — M5126 Other intervertebral disc displacement, lumbar region: Secondary | ICD-10-CM

## 2016-10-16 DIAGNOSIS — M5020 Other cervical disc displacement, unspecified cervical region: Secondary | ICD-10-CM

## 2016-10-16 NOTE — Telephone Encounter (Signed)
Pt contacted office for refill request on the following medications:  Oxycodone HCl 10 MG TABS.  [id number]

## 2016-10-16 NOTE — Telephone Encounter (Signed)
Called Pt to schedule AWV with [facility] - knb °

## 2016-10-16 NOTE — Telephone Encounter (Signed)
Please review

## 2016-10-17 MED ORDER — OXYCODONE HCL 10 MG PO TABS: 10.0000 mg | ORAL_TABLET | Freq: Four times a day (QID) | ORAL | 0 refills | Status: DC | PRN

## 2016-10-17 NOTE — Telephone Encounter (Signed)
Advised patient needs o.v. In December before next refils

## 2016-11-07 DIAGNOSIS — M5417 Radiculopathy, lumbosacral region: Secondary | ICD-10-CM | POA: Diagnosis not present

## 2016-11-15 ENCOUNTER — Ambulatory Visit (INDEPENDENT_AMBULATORY_CARE_PROVIDER_SITE_OTHER): Payer: Medicare Other | Admitting: Family Medicine

## 2016-11-15 ENCOUNTER — Encounter: Payer: Self-pay | Admitting: Family Medicine

## 2016-11-15 VITALS — BP 160/86 | HR 113 | Temp 98.0°F | Resp 20 | Wt 241.0 lb

## 2016-11-15 DIAGNOSIS — F11288 Opioid dependence with other opioid-induced disorder: Secondary | ICD-10-CM | POA: Diagnosis not present

## 2016-11-15 DIAGNOSIS — M5020 Other cervical disc displacement, unspecified cervical region: Secondary | ICD-10-CM

## 2016-11-15 DIAGNOSIS — R1900 Intra-abdominal and pelvic swelling, mass and lump, unspecified site: Secondary | ICD-10-CM | POA: Diagnosis not present

## 2016-11-15 DIAGNOSIS — M5126 Other intervertebral disc displacement, lumbar region: Secondary | ICD-10-CM

## 2016-11-15 DIAGNOSIS — F1190 Opioid use, unspecified, uncomplicated: Secondary | ICD-10-CM

## 2016-11-15 DIAGNOSIS — M5431 Sciatica, right side: Secondary | ICD-10-CM | POA: Diagnosis not present

## 2016-11-15 DIAGNOSIS — J40 Bronchitis, not specified as acute or chronic: Secondary | ICD-10-CM

## 2016-11-15 MED ORDER — OXYCODONE HCL 10 MG PO TABS: 10.0000 mg | ORAL_TABLET | Freq: Four times a day (QID) | ORAL | 0 refills | Status: DC | PRN

## 2016-11-15 MED ORDER — AMOXICILLIN 500 MG PO CAPS: 1000.0000 mg | ORAL_CAPSULE | Freq: Three times a day (TID) | ORAL | 0 refills | Status: AC

## 2016-11-15 MED ORDER — GABAPENTIN 300 MG PO CAPS: 300.0000 mg | ORAL_CAPSULE | Freq: Two times a day (BID) | ORAL | 3 refills | Status: DC

## 2016-11-15 NOTE — Progress Notes (Signed)
Patient: Ray Robles Male    DOB: 16-Oct-1961   55 y.o.   MRN: [id number] Visit Date: 11/15/2016  Today's Provider: Lelon Huh, MD   Chief Complaint  Patient presents with  . Back Pain    follow up   Subjective:    Back Pain  Pertinent negatives include no abdominal pain, chest pain or fever.   Follow up of Back pain: Patient present with his wife today for follow up opioid medications. Patient was last seen for this problem 3 months ago. During that visit a urine drug screen was obtained and an MRI which showed moderate sized herniated disk compressing S1 nerve root. Patient was referred to neurosurgery. He was seen by Dr. Dava Najjar on 11-07-2016 and anticipates right L5-S1 microdiscectomy. He is seeing clincal pain psychologist (Dr. Hulan Saas) at [facility] and is scheduled to follow up on 11-20-2016. UDS is anticipated to be done at that time.  Oxycodone prescriptions have been reduced from 12x15mg  tablets a day to 4x10mg  tablets a day but states pain is not controlled. He is anxious and states he feels like he needs to go back up on frequency of oxycodone.   Also complains of persistent cough and chills the last  Weeks. Feels like chest is congested, but cough is non-productive. No fevers or dyspnea. Slight congestion of sinuses.     Allergies  Allergen Reactions  . Ambien  [Zolpidem Tartrate]     Violent sleep  . Ibuprofen Other (See Comments)    Can't take because of liver  . Morphine Nausea Only, Nausea And Vomiting and Other (See Comments)    Other reaction(s): Vomiting N&V, Diarrhea  . Morphine And Related Nausea And Vomiting    N&V, Diarrhea  . Tylenol [Acetaminophen] Other (See Comments)    Can't take because of liver  . Xanax [Alprazolam] Other (See Comments)    Brings the ammonia levels up  . Zolpidem Other (See Comments)    Violent Sleep     Current Outpatient Prescriptions:  .  diphenoxylate-atropine (LOMOTIL) 2.5-0.025 MG per tablet, Take 2 tablets  by mouth every 6 (six) hours as needed. For diarrhea, Disp: , Rfl:  .  escitalopram (LEXAPRO) 20 MG tablet, Take 1 tablet (20 mg total) by mouth daily., Disp: 30 tablet, Rfl: 3 .  furosemide (LASIX) 20 MG tablet, Take 20 mg by mouth daily., Disp: , Rfl:  .  gabapentin (NEURONTIN) 300 MG capsule, Take 1 capsule (300 mg total) by mouth at bedtime., Disp: 30 capsule, Rfl: 3 .  hyoscyamine (ANASPAZ) 0.125 MG TBDP disintergrating tablet, Place 0.125 mg under the tongue every 4 (four) hours as needed for bladder spasms or cramping., Disp: , Rfl:  .  lactulose (CHRONULAC) 10 GM/15ML solution, Take 30 mLs by mouth 3 (three) times daily., Disp: , Rfl: 0 .  lithium carbonate (ESKALITH) 450 MG CR tablet, Take 1 tablet (450 mg total) by mouth at bedtime., Disp: 30 tablet, Rfl: 3 .  metoprolol tartrate (LOPRESSOR) 25 MG tablet, Take 1 tablet (25 mg total) by mouth 2 (two) times daily., Disp: 30 tablet, Rfl: 12 .  nitroGLYCERIN (NITROSTAT) 0.4 MG SL tablet, Place 0.4 mg under the tongue every 5 (five) minutes as needed for chest pain., Disp: , Rfl:  .  nystatin cream (MYCOSTATIN), Apply 1 application topically 3 (three) times daily as needed., Disp: , Rfl:  .  omeprazole (PRILOSEC) 20 MG capsule, TAKE 2 CAPSULES BY MOUTH ONCE DAILY., Disp: 60 capsule, Rfl: 5 .  ondansetron (ZOFRAN-ODT) 4 MG disintegrating tablet, Take 4 mg by mouth every 8 (eight) hours as needed for nausea or vomiting., Disp: , Rfl:  .  Oxycodone HCl 10 MG TABS, Take 1 tablet (10 mg total) by mouth every 6 (six) hours as needed. No more than 4 tablets in one day, Disp: 120 tablet, Rfl: 0 .  prazosin (MINIPRESS) 5 MG capsule, Take 1 capsule (5 mg total) by mouth at bedtime., Disp: 30 capsule, Rfl: 3 .  predniSONE (DELTASONE) 20 MG tablet, TAKE 4 TABLETS FOR 2 DAYS, 3 TABLETS FOR2 DAYS, 2 TABLETS FOR 2 DAYS AND THEN 1 TABLET FOR 2 DAYS., Disp: 20 tablet, Rfl: 1 .  QUEtiapine (SEROQUEL) 200 MG tablet, Take 1.5 tablets (300 mg total) by mouth at  bedtime., Disp: 45 tablet, Rfl: 3 .  sildenafil (REVATIO) 20 MG tablet, Take 2-3 tablets by mouth as needed. No more than 3 in a day, Disp: , Rfl:  .  spironolactone (ALDACTONE) 50 MG tablet, Take 1 tablet (50 mg total) by mouth daily., Disp: 30 tablet, Rfl: 6  Review of Systems  Constitutional: Positive for unexpected weight change. Negative for appetite change, chills and fever.  Respiratory: Positive for cough and shortness of breath. Negative for chest tightness and wheezing.   Cardiovascular: Positive for leg swelling. Negative for chest pain and palpitations.  Gastrointestinal: Positive for abdominal distention. Negative for abdominal pain, nausea and vomiting.  Musculoskeletal: Positive for arthralgias, back pain and gait problem.  Neurological: Positive for tremors.  Psychiatric/Behavioral: Positive for sleep disturbance.    Social History  Substance Use Topics  . Smoking status: Never Smoker  . Smokeless tobacco: Never Used  . Alcohol use No   Objective:   BP (!) 160/86 (BP Location: Right Arm, Patient Position: Sitting, Cuff Size: Large)   Pulse (!) 113   Temp 98 F (36.7 C) (Oral)   Resp 20   Wt 241 lb (109.3 kg)   SpO2 97% Comment: room air  BMI 34.58 kg/m   Physical Exam  General Appearance:    Alert, cooperative, no distress  HENT:   bilateral TM normal without fluid or infection, throat normal without erythema or exudate, sinuses nontender and nasal mucosa congested  Eyes:    PERRL, conjunctiva/corneas clear, EOM's intact       Lungs:     Occasional expiratory wheeze, no rales, , respirations unlabored  Heart:    Regular rate and rhythm  Abd:  Moderately distended. No discrete masses. No focal tenderness.   Neurologic:   Awake, alert, oriented x 3. No apparent focal neurological           defect.           Assessment & Plan:      1. Opioid dependence with other opioid-induced disorder ([place]) He states he would like to increase frequency of oxycodone, but  counseled on importance of minimizing opioid use. He does have legitimate source of pain. Will increase gabapentin to BID as below. Refill current regiment of oxycodone as below and attempt to wean further once he increases gabapentin.   Urine drug screen was NOT done today. He was not able to produce urine sample at check-in, and notes from [facility] indicate they anticipate performing drug screen at appointment on 11-20-2016   2. Sciatica, right side   3. Lumbar disc herniation  - Oxycodone HCl 10 MG TABS; Take 1 tablet (10 mg total) by mouth every 6 (six) hours as needed. No more than 4 tablets  in one day  Dispense: 120 tablet; Refill: 0  4. Bronchitis Amoxicillin 500mg  2 three a day for 5 days.  Call if symptoms change or if not rapidly improving.     6. Herniated Lumbar disc Anticipate microdiskectomy at [facility] in the near future.  - Oxycodone HCl 10 MG TABS; Take 1 tablet (10 mg total) by mouth every 6 (six) hours as needed. No more than 4 tablets in one day  Dispense: 120 tablet; Refill: 0 - gabapentin (NEURONTIN) 300 MG capsule; Take 1 capsule (300 mg total) by mouth 2 (two) times daily.  Dispense: 30 capsule; Refill: 3  6. Abdominal swelling Is not scheduled to see GI until next month. Will go ahead and get abdominal imaging.  - US Abdomen Complete; Future       Lelon Huh, MD  [facility]

## 2016-11-21 ENCOUNTER — Ambulatory Visit: Payer: Medicare Other

## 2016-11-26 ENCOUNTER — Other Ambulatory Visit: Payer: Self-pay | Admitting: Family Medicine

## 2016-11-26 NOTE — Telephone Encounter (Signed)
Please call in Sonata 

## 2016-11-26 NOTE — Telephone Encounter (Signed)
Called into [facility].

## 2016-11-28 DIAGNOSIS — G8929 Other chronic pain: Secondary | ICD-10-CM | POA: Diagnosis not present

## 2016-11-28 DIAGNOSIS — M5441 Lumbago with sciatica, right side: Secondary | ICD-10-CM | POA: Diagnosis not present

## 2016-11-28 DIAGNOSIS — M5442 Lumbago with sciatica, left side: Secondary | ICD-10-CM | POA: Diagnosis not present

## 2016-12-13 ENCOUNTER — Other Ambulatory Visit: Payer: Self-pay | Admitting: Family Medicine

## 2016-12-13 DIAGNOSIS — M5020 Other cervical disc displacement, unspecified cervical region: Secondary | ICD-10-CM

## 2016-12-13 DIAGNOSIS — M5126 Other intervertebral disc displacement, lumbar region: Secondary | ICD-10-CM

## 2016-12-13 MED ORDER — OXYCODONE HCL 10 MG PO TABS: 10.0000 mg | ORAL_TABLET | Freq: Four times a day (QID) | ORAL | 0 refills | Status: DC | PRN

## 2016-12-13 NOTE — Telephone Encounter (Signed)
Needs refill on his   Oxycodone HCl 10 MG TABS  Please notify when ready to pick up.  Will be out Sunday.  Thanks Con Memos

## 2016-12-27 DIAGNOSIS — K7469 Other cirrhosis of liver: Secondary | ICD-10-CM | POA: Diagnosis not present

## 2016-12-27 DIAGNOSIS — K744 Secondary biliary cirrhosis: Secondary | ICD-10-CM | POA: Diagnosis not present

## 2016-12-27 DIAGNOSIS — C220 Liver cell carcinoma: Secondary | ICD-10-CM | POA: Diagnosis not present

## 2016-12-27 DIAGNOSIS — R161 Splenomegaly, not elsewhere classified: Secondary | ICD-10-CM | POA: Diagnosis not present

## 2017-01-04 ENCOUNTER — Other Ambulatory Visit: Payer: Self-pay | Admitting: Family Medicine

## 2017-01-05 NOTE — Telephone Encounter (Signed)
Please call in sonata

## 2017-01-06 NOTE — Telephone Encounter (Signed)
Rx called in to pharmacy. 

## 2017-01-10 ENCOUNTER — Ambulatory Visit (INDEPENDENT_AMBULATORY_CARE_PROVIDER_SITE_OTHER): Payer: Medicare Other | Admitting: Family Medicine

## 2017-01-10 ENCOUNTER — Encounter: Payer: Self-pay | Admitting: Family Medicine

## 2017-01-10 VITALS — BP 150/80 | HR 110 | Temp 98.5°F | Resp 16 | Ht 70.0 in | Wt 232.0 lb

## 2017-01-10 DIAGNOSIS — G4700 Insomnia, unspecified: Secondary | ICD-10-CM | POA: Diagnosis not present

## 2017-01-10 DIAGNOSIS — L719 Rosacea, unspecified: Secondary | ICD-10-CM | POA: Diagnosis not present

## 2017-01-10 DIAGNOSIS — I10 Essential (primary) hypertension: Secondary | ICD-10-CM | POA: Diagnosis not present

## 2017-01-10 DIAGNOSIS — M5020 Other cervical disc displacement, unspecified cervical region: Secondary | ICD-10-CM

## 2017-01-10 DIAGNOSIS — R7303 Prediabetes: Secondary | ICD-10-CM | POA: Diagnosis not present

## 2017-01-10 DIAGNOSIS — M5126 Other intervertebral disc displacement, lumbar region: Secondary | ICD-10-CM

## 2017-01-10 LAB — POCT GLYCOSYLATED HEMOGLOBIN (HGB A1C)
Est. average glucose Bld gHb Est-mCnc: 108
Hemoglobin A1C: 5.4

## 2017-01-10 MED ORDER — OXYCODONE HCL 10 MG PO TABS: 10.0000 mg | ORAL_TABLET | Freq: Four times a day (QID) | ORAL | 0 refills | Status: DC | PRN

## 2017-01-10 MED ORDER — METRONIDAZOLE 0.75 % EX CREA: TOPICAL_CREAM | Freq: Two times a day (BID) | CUTANEOUS | 0 refills | Status: DC

## 2017-01-10 MED ORDER — ZALEPLON 10 MG PO CAPS: ORAL_CAPSULE | ORAL | 3 refills | Status: DC

## 2017-01-10 NOTE — Progress Notes (Signed)
Patient: Ray Robles Male    DOB: Aug 08, 1961   56 y.o.   MRN: [id number] Visit Date: 01/10/2017  Today's Provider: Lelon Huh, MD   Chief Complaint  Patient presents with  . Follow-up   Subjective:    HPI  Follow-up on medications:last office visit was on 11/15/2016. Patient also wants to discuss redness on his face. He had similar rash several years ago treated with rx cream which he states worked well.  Also follow up chronic pain and opioid dependence. He was to have back surgery in January after completing PT, but apparently PT was delayed due to weather. He also needs follow up GI prior to surgery. He states he has three more sessions with psychiatry before starting suboxone. He has been on significantly lower dose of oxycodone at 10mg  QID for several months, but states he is in constant pain. .  Wt Readings from Last 3 Encounters:  01/10/17 232 lb (105.2 kg)  11/15/16 241 lb (109.3 kg)  08/19/16 218 lb (98.9 kg)     Allergies  Allergen Reactions  . Ambien  [Zolpidem Tartrate]     Violent sleep  . Ibuprofen Other (See Comments)    Can't take because of liver  . Morphine Nausea Only, Nausea And Vomiting and Other (See Comments)    Other reaction(s): Vomiting N&V, Diarrhea  . Morphine And Related Nausea And Vomiting    N&V, Diarrhea  . Tylenol [Acetaminophen] Other (See Comments)    Can't take because of liver  . Xanax [Alprazolam] Other (See Comments)    Brings the ammonia levels up  . Zolpidem Other (See Comments)    Violent Sleep     Current Outpatient Prescriptions:  .  diphenoxylate-atropine (LOMOTIL) 2.5-0.025 MG per tablet, Take 2 tablets by mouth every 6 (six) hours as needed. For diarrhea, Disp: , Rfl:  .  escitalopram (LEXAPRO) 20 MG tablet, Take 1 tablet (20 mg total) by mouth daily., Disp: 30 tablet, Rfl: 3 .  furosemide (LASIX) 20 MG tablet, Take 20 mg by mouth daily., Disp: , Rfl:  .  gabapentin (NEURONTIN) 300 MG capsule, Take 1 capsule  (300 mg total) by mouth 2 (two) times daily., Disp: 30 capsule, Rfl: 3 .  hyoscyamine (ANASPAZ) 0.125 MG TBDP disintergrating tablet, Place 0.125 mg under the tongue every 4 (four) hours as needed for bladder spasms or cramping., Disp: , Rfl:  .  lactulose (CHRONULAC) 10 GM/15ML solution, Take 30 mLs by mouth 3 (three) times daily., Disp: , Rfl: 0 .  lithium carbonate (ESKALITH) 450 MG CR tablet, Take 1 tablet (450 mg total) by mouth at bedtime., Disp: 30 tablet, Rfl: 3 .  metoprolol tartrate (LOPRESSOR) 25 MG tablet, Take 1 tablet (25 mg total) by mouth 2 (two) times daily., Disp: 30 tablet, Rfl: 12 .  nitroGLYCERIN (NITROSTAT) 0.4 MG SL tablet, Place 0.4 mg under the tongue every 5 (five) minutes as needed for chest pain., Disp: , Rfl:  .  nystatin cream (MYCOSTATIN), Apply 1 application topically 3 (three) times daily as needed., Disp: , Rfl:  .  omeprazole (PRILOSEC) 20 MG capsule, TAKE 2 CAPSULES BY MOUTH ONCE DAILY., Disp: 60 capsule, Rfl: 5 .  ondansetron (ZOFRAN-ODT) 4 MG disintegrating tablet, Take 4 mg by mouth every 8 (eight) hours as needed for nausea or vomiting., Disp: , Rfl:  .  Oxycodone HCl 10 MG TABS, Take 1 tablet (10 mg total) by mouth every 6 (six) hours as needed. No more than  4 tablets in one day, Disp: 120 tablet, Rfl: 0 .  prazosin (MINIPRESS) 5 MG capsule, Take 1 capsule (5 mg total) by mouth at bedtime., Disp: 30 capsule, Rfl: 3 .  QUEtiapine (SEROQUEL) 200 MG tablet, Take 1.5 tablets (300 mg total) by mouth at bedtime., Disp: 45 tablet, Rfl: 3 .  sildenafil (REVATIO) 20 MG tablet, Take 2-3 tablets by mouth as needed. No more than 3 in a day, Disp: , Rfl:  .  spironolactone (ALDACTONE) 50 MG tablet, Take 1 tablet (50 mg total) by mouth daily., Disp: 30 tablet, Rfl: 6 .  zaleplon (SONATA) 10 MG capsule, TAKE 1 CAPSULE BY MOUTH AT BEDTIME AS NEEDED SLEEP, Disp: 10 capsule, Rfl: 5  Review of Systems  Constitutional: Negative for appetite change, chills and fever.    Respiratory: Negative for chest tightness, shortness of breath and wheezing.   Cardiovascular: Negative for chest pain and palpitations.  Gastrointestinal: Negative for abdominal pain, nausea and vomiting.    Social History  Substance Use Topics  . Smoking status: Never Smoker  . Smokeless tobacco: Never Used  . Alcohol use No   Objective:   BP (!) 150/80 (BP Location: Left Arm, Patient Position: Sitting, Cuff Size: Large)   Pulse (!) 110   Temp 98.5 F (36.9 C) (Oral)   Resp 16   Ht 5\' 10"  (1.778 m)   Wt 232 lb (105.2 kg)   SpO2 98%   BMI 33.29 kg/m   Physical Exam   General Appearance:    Alert, cooperative, no distress  Eyes:    PERRL, conjunctiva/corneas clear, EOM's intact       Lungs:     Clear to auscultation bilaterally, respirations unlabored  Heart:    Regular rate and rhythm  Neurologic:   Awake, alert, oriented x 3. No apparent focal neurological           defect.   Skin:   Malar rash c/w rosacea.    Results for orders placed or performed in visit on 01/10/17  POCT glycosylated hemoglobin (Hb A1C)  Result Value Ref Range   Hemoglobin A1C 5.4    Est. average glucose Bld gHb Est-mCnc 108        Assessment & Plan:     1. Rosacea  - metroNIDAZOLE (METROCREAM) 0.75 % cream; Apply topically 2 (two) times daily.  Dispense: 45 g; Refill: 0  2. Herniated cervical disc Stable on lowered dose of oxycodone.  - Oxycodone HCl 10 MG TABS; Take 1 tablet (10 mg total) by mouth every 6 (six) hours as needed. No more than 4 tablets in one day  Dispense: 120 tablet; Refill: 0  3. Lumbar disc herniation  - Oxycodone HCl 10 MG TABS; Take 1 tablet (10 mg total) by mouth every 6 (six) hours as needed. No more than 4 tablets in one day  Dispense: 120 tablet; Refill: 0  4. Hypertension, unspecified type Stable. .  5. Prediabetes  - POCT glycosylated hemoglobin (Hb A1C)  6. Insomnia, unspecified type Refill sonata.  - zaleplon (SONATA) 10 MG capsule; TAKE 1 CAPSULE  BY MOUTH AT BEDTIME AS NEEDED SLEEP  Dispense: 30 capsule; Refill: 3     The entirety of the information documented in the History of Present Illness, Review of Systems and Physical Exam were personally obtained by me. Portions of this information were initially documented by Theressa Millard, CMA and reviewed by me for thoroughness and accuracy.    Lelon Huh, MD  [facility]

## 2017-01-12 ENCOUNTER — Emergency Department: Payer: Medicare Other

## 2017-01-12 ENCOUNTER — Emergency Department
Admission: EM | Admit: 2017-01-12 | Discharge: 2017-01-13 | Disposition: A | Discharge status: Home / Self Care | Payer: Medicare Other | Attending: Emergency Medicine | Admitting: Emergency Medicine

## 2017-01-12 ENCOUNTER — Encounter: Payer: Self-pay | Admitting: Emergency Medicine

## 2017-01-12 DIAGNOSIS — I10 Essential (primary) hypertension: Secondary | ICD-10-CM | POA: Diagnosis not present

## 2017-01-12 DIAGNOSIS — Z79899 Other long term (current) drug therapy: Secondary | ICD-10-CM | POA: Insufficient documentation

## 2017-01-12 DIAGNOSIS — R4182 Altered mental status, unspecified: Secondary | ICD-10-CM | POA: Diagnosis not present

## 2017-01-12 DIAGNOSIS — T40601A Poisoning by unspecified narcotics, accidental (unintentional), initial encounter: Secondary | ICD-10-CM

## 2017-01-12 DIAGNOSIS — T402X1A Poisoning by other opioids, accidental (unintentional), initial encounter: Secondary | ICD-10-CM | POA: Diagnosis not present

## 2017-01-12 DIAGNOSIS — R400 Somnolence: Secondary | ICD-10-CM | POA: Diagnosis not present

## 2017-01-12 DIAGNOSIS — R404 Transient alteration of awareness: Secondary | ICD-10-CM | POA: Diagnosis not present

## 2017-01-12 HISTORY — DX: Unspecified cirrhosis of liver: K74.60

## 2017-01-12 LAB — COMPREHENSIVE METABOLIC PANEL
ALBUMIN: 4.2 g/dL (ref 3.5–5.0)
ALK PHOS: 81 U/L (ref 38–126)
ALT: 15 U/L — ABNORMAL LOW (ref 17–63)
ANION GAP: 6 (ref 5–15)
AST: 22 U/L (ref 15–41)
BUN: 14 mg/dL (ref 6–20)
CALCIUM: 8.7 mg/dL — AB (ref 8.9–10.3)
CO2: 27 mmol/L (ref 22–32)
Chloride: 103 mmol/L (ref 101–111)
Creatinine, Ser: 1.1 mg/dL (ref 0.61–1.24)
GFR calc non Af Amer: >60 mL/min (ref >60)
GLUCOSE: 109 mg/dL — AB (ref 65–99)
POTASSIUM: 4.3 mmol/L (ref 3.5–5.1)
Sodium: 136 mmol/L (ref 135–145)
TOTAL PROTEIN: 7.6 g/dL (ref 6.5–8.1)
Total Bilirubin: 0.7 mg/dL (ref 0.3–1.2)

## 2017-01-12 LAB — CBC
HCT: 34.6 % — ABNORMAL LOW (ref 40.0–52.0)
HEMOGLOBIN: 12.3 g/dL — AB (ref 13.0–18.0)
MCH: 31 pg (ref 26.0–34.0)
MCHC: 35.4 g/dL (ref 32.0–36.0)
MCV: 87.6 fL (ref 80.0–100.0)
Platelets: 123 10*3/uL — ABNORMAL LOW (ref 150–440)
RBC: 3.95 MIL/uL — ABNORMAL LOW (ref 4.40–5.90)
RDW: 13.3 % (ref 11.5–14.5)
WBC: 4.7 10*3/uL (ref 3.8–10.6)

## 2017-01-12 LAB — URINALYSIS, COMPLETE (UACMP) WITH MICROSCOPIC
Bacteria, UA: NONE SEEN
Bilirubin Urine: NEGATIVE
GLUCOSE, UA: NEGATIVE mg/dL
Hgb urine dipstick: NEGATIVE
Ketones, ur: NEGATIVE mg/dL
Leukocytes, UA: NEGATIVE
Nitrite: NEGATIVE
PROTEIN: NEGATIVE mg/dL
SPECIFIC GRAVITY, URINE: 1.006 (ref 1.005–1.030)
Squamous Epithelial / LPF: NONE SEEN
pH: 6 (ref 5.0–8.0)

## 2017-01-12 LAB — URINE DRUG SCREEN, QUALITATIVE (ARMC ONLY)
AMPHETAMINES, UR SCREEN: NOT DETECTED
Barbiturates, Ur Screen: NOT DETECTED
Benzodiazepine, Ur Scrn: NOT DETECTED
COCAINE METABOLITE, UR ~~LOC~~: NOT DETECTED
Cannabinoid 50 Ng, Ur ~~LOC~~: NOT DETECTED
MDMA (ECSTASY) UR SCREEN: NOT DETECTED
Methadone Scn, Ur: NOT DETECTED
Opiate, Ur Screen: POSITIVE — AB
PHENCYCLIDINE (PCP) UR S: NOT DETECTED
TRICYCLIC, UR SCREEN: POSITIVE — AB

## 2017-01-12 LAB — AMMONIA: AMMONIA: 29 umol/L (ref 9–35)

## 2017-01-12 LAB — LITHIUM LEVEL: LITHIUM LVL: 1.19 mmol/L (ref 0.60–1.20)

## 2017-01-12 NOTE — ED Triage Notes (Signed)
Patient brought in by ems from home with AMS that started today. Patient with a history of cirrhosis and per family does not take his lactulose as prescribed.

## 2017-01-13 DIAGNOSIS — G9340 Encephalopathy, unspecified: Secondary | ICD-10-CM | POA: Diagnosis not present

## 2017-01-13 DIAGNOSIS — Z833 Family history of diabetes mellitus: Secondary | ICD-10-CM | POA: Diagnosis not present

## 2017-01-13 DIAGNOSIS — R161 Splenomegaly, not elsewhere classified: Secondary | ICD-10-CM | POA: Diagnosis not present

## 2017-01-13 DIAGNOSIS — B182 Chronic viral hepatitis C: Secondary | ICD-10-CM | POA: Diagnosis not present

## 2017-01-13 DIAGNOSIS — Z888 Allergy status to other drugs, medicaments and biological substances status: Secondary | ICD-10-CM | POA: Diagnosis not present

## 2017-01-13 DIAGNOSIS — F4310 Post-traumatic stress disorder, unspecified: Secondary | ICD-10-CM | POA: Diagnosis not present

## 2017-01-13 DIAGNOSIS — K922 Gastrointestinal hemorrhage, unspecified: Secondary | ICD-10-CM | POA: Diagnosis not present

## 2017-01-13 DIAGNOSIS — Z885 Allergy status to narcotic agent status: Secondary | ICD-10-CM | POA: Diagnosis not present

## 2017-01-13 DIAGNOSIS — G629 Polyneuropathy, unspecified: Secondary | ICD-10-CM | POA: Diagnosis not present

## 2017-01-13 DIAGNOSIS — K219 Gastro-esophageal reflux disease without esophagitis: Secondary | ICD-10-CM | POA: Diagnosis not present

## 2017-01-13 DIAGNOSIS — T402X1A Poisoning by other opioids, accidental (unintentional), initial encounter: Secondary | ICD-10-CM | POA: Diagnosis not present

## 2017-01-13 DIAGNOSIS — I10 Essential (primary) hypertension: Secondary | ICD-10-CM | POA: Diagnosis not present

## 2017-01-13 DIAGNOSIS — K7460 Unspecified cirrhosis of liver: Secondary | ICD-10-CM | POA: Diagnosis not present

## 2017-01-13 DIAGNOSIS — K766 Portal hypertension: Secondary | ICD-10-CM | POA: Diagnosis not present

## 2017-01-13 DIAGNOSIS — Z809 Family history of malignant neoplasm, unspecified: Secondary | ICD-10-CM | POA: Diagnosis not present

## 2017-01-13 DIAGNOSIS — R251 Tremor, unspecified: Secondary | ICD-10-CM | POA: Diagnosis not present

## 2017-01-13 DIAGNOSIS — M549 Dorsalgia, unspecified: Secondary | ICD-10-CM | POA: Diagnosis not present

## 2017-01-13 DIAGNOSIS — Z79899 Other long term (current) drug therapy: Secondary | ICD-10-CM | POA: Diagnosis not present

## 2017-01-13 DIAGNOSIS — R188 Other ascites: Secondary | ICD-10-CM | POA: Diagnosis not present

## 2017-01-13 DIAGNOSIS — Z886 Allergy status to analgesic agent status: Secondary | ICD-10-CM | POA: Diagnosis not present

## 2017-01-13 DIAGNOSIS — B1920 Unspecified viral hepatitis C without hepatic coma: Secondary | ICD-10-CM | POA: Diagnosis not present

## 2017-01-13 LAB — BLOOD GAS, VENOUS
Acid-Base Excess: 2.9 mmol/L — ABNORMAL HIGH (ref 0.0–2.0)
Bicarbonate: 28.5 mmol/L — ABNORMAL HIGH (ref 20.0–28.0)
O2 SAT: 74.4 %
PCO2 VEN: 47 mmHg (ref 44.0–60.0)
PH VEN: 7.39 (ref 7.250–7.430)
PO2 VEN: 40 mmHg (ref 32.0–45.0)
Patient temperature: 37

## 2017-01-13 MED ORDER — NALOXONE HCL 2 MG/2ML IJ SOSY
0.4000 mg | PREFILLED_SYRINGE | Freq: Once | INTRAMUSCULAR | Status: AC
  Administered 2017-01-13: 0.4 mg via INTRAVENOUS
  Filled 2017-01-13: qty 2

## 2017-01-13 NOTE — ED Provider Notes (Signed)
[facility] Emergency Department Provider Note   ____________________________________________   First MD Initiated Contact with Patient 01/12/17 2303     (approximate)  I have reviewed the triage vital signs and the nursing notes.   HISTORY  Chief Complaint Altered Mental Status   HPI Ray Robles is a 56 y.o. male who comes into the hospital today with some altered mental status. The patient's family came home from church and found the patient with some unresponsiveness. They report that he has liver issues and has had problems with his ammonia in the past. He went to church this morning and was fine but at 6 PM when they return home he wouldn't wake up. They report that typically his ammonia gradually causes some confusion but has never happened this past before. He seems very tired and wouldn't wake up. He didn't say anything initially when he arrived home but now is speaking a little bit. Normally he is very responsive and talkative. He has an appointment with his GI doctor tomorrow at [facility]. He was told on Friday that he may be diabetic but he's not on any medications. He takes Seroquel to go to sleep and he took his medicines tonight except for his lactulose. He has been on the Seroquel for 3 months. He's had no fever no cough or runny nose or nausea no vomiting no diarrhea he denies any falls or any pain anywhere. The patient is here tonight for evaluation of his somnolence.   Past Medical History:  Diagnosis Date  . Cirrhosis ([place])   . H/O bacterial pneumonia 11/11/2007   2008 and 2015   . Heart murmur   . Hepatitis C   . Hx of endoscopy    05/12/2014- [facility]. Dr Candace Cruise. Benign appearing stricture. Dilated the examination was otherwise normal.02/13/2012- Beniign appearing intrinsic mild stenosis at GE junction, successfully dialated  . Liver disease     Patient Active Problem List   Diagnosis Date Noted  . Sciatica, right side 07/18/2016  . Actinic  keratosis 01/29/2016  . Cirrhosis, non-alcoholic ([place]) 0000000  . Abnormal LFTs 05/12/2015  . Ache in joint 05/12/2015  . Arthritis of knee 05/12/2015  . Chronic pain 05/12/2015  . Dysphagia 05/12/2015  . ED (erectile dysfunction) of organic origin 05/12/2015  . Hypokalemia 05/12/2015  . Positive urine drug screen 05/12/2015  . Renal function impairment 05/12/2015  . Hernia, inguinal, right 05/12/2015  . Seborrheic keratosis 05/12/2015  . Hepatic encephalopathy ([place])   . Hypertension   . Sleep apnea   . Anxiety   . Benign hypertension 04/01/2015  . Arthritis 03/22/2015  . BP (high blood pressure) 03/22/2015  . Migraine with aura 02/09/2010  . Herniated cervical disc 07/07/2007  . Lumbar disc herniation 07/07/2007  . Insomnia 06/10/2007  . Ascites 05/06/2007  . Esophageal reflux 04/01/2005  . Depression, major, recurrent, moderate ([place]) 12/02/1998  . Neuropathy, peripheral, autonomic, idiopathic 12/02/1998  . Hepatitis C 12/02/1988    Past Surgical History:  Procedure Laterality Date  . COLONOSCOPY WITH PROPOFOL N/A 05/12/2015   Procedure: COLONOSCOPY WITH PROPOFOL;  Surgeon: Hulen Luster, MD;  Location: [facility] ENDOSCOPY;  Service: Gastroenterology;  Laterality: N/A;  . HERNIA REPAIR  08/2012   Dr. Jamal Collin; [facility]    Prior to Admission medications   Medication Sig Start Date End Date Taking? Authorizing Provider  diphenoxylate-atropine (LOMOTIL) 2.5-0.025 MG per tablet Take 2 tablets by mouth every 6 (six) hours as needed. For diarrhea 02/24/15   Historical Provider, MD  escitalopram (LEXAPRO) 20 MG tablet Take 1 tablet (20 mg total) by mouth daily. 10/15/16   Rainey Pines, MD  furosemide (LASIX) 20 MG tablet Take 20 mg by mouth daily.    Historical Provider, MD  gabapentin (NEURONTIN) 300 MG capsule Take 1 capsule (300 mg total) by mouth 2 (two) times daily. 11/15/16 11/15/17  Birdie Sons, MD  hyoscyamine (ANASPAZ) 0.125 MG TBDP disintergrating tablet Place 0.125 mg under the  tongue every 4 (four) hours as needed for bladder spasms or cramping.    Historical Provider, MD  lactulose (CHRONULAC) 10 GM/15ML solution Take 30 mLs by mouth 3 (three) times daily. 03/19/15   Historical Provider, MD  lithium carbonate (ESKALITH) 450 MG CR tablet Take 1 tablet (450 mg total) by mouth at bedtime. 10/15/16   Rainey Pines, MD  metoprolol tartrate (LOPRESSOR) 25 MG tablet Take 1 tablet (25 mg total) by mouth 2 (two) times daily. 06/20/16   Birdie Sons, MD  metroNIDAZOLE (METROCREAM) 0.75 % cream Apply topically 2 (two) times daily. 01/10/17   Birdie Sons, MD  nitroGLYCERIN (NITROSTAT) 0.4 MG SL tablet Place 0.4 mg under the tongue every 5 (five) minutes as needed for chest pain.    Historical Provider, MD  nystatin cream (MYCOSTATIN) Apply 1 application topically 3 (three) times daily as needed.    Historical Provider, MD  omeprazole (PRILOSEC) 20 MG capsule TAKE 2 CAPSULES BY MOUTH ONCE DAILY. 10/03/16   Birdie Sons, MD  ondansetron (ZOFRAN-ODT) 4 MG disintegrating tablet Take 4 mg by mouth every 8 (eight) hours as needed for nausea or vomiting.    Historical Provider, MD  Oxycodone HCl 10 MG TABS Take 1 tablet (10 mg total) by mouth every 6 (six) hours as needed. No more than 4 tablets in one day 01/10/17   Birdie Sons, MD  prazosin (MINIPRESS) 5 MG capsule Take 1 capsule (5 mg total) by mouth at bedtime. 10/15/16   Rainey Pines, MD  QUEtiapine (SEROQUEL) 200 MG tablet Take 1.5 tablets (300 mg total) by mouth at bedtime. 10/15/16   Rainey Pines, MD  sildenafil (REVATIO) 20 MG tablet Take 2-3 tablets by mouth as needed. No more than 3 in a day 04/04/15   Historical Provider, MD  spironolactone (ALDACTONE) 50 MG tablet Take 1 tablet (50 mg total) by mouth daily. 03/27/16   Birdie Sons, MD  zaleplon (SONATA) 10 MG capsule TAKE 1 CAPSULE BY MOUTH AT BEDTIME AS NEEDED SLEEP 01/10/17   Birdie Sons, MD    Allergies Ambien  [zolpidem tartrate]; Ibuprofen; Morphine; Morphine and  related; Tylenol [acetaminophen]; Xanax [alprazolam]; and Zolpidem  Family History  Problem Relation Age of Onset  . Cancer Mother   . Cancer Father   . Alcohol abuse Brother   . Alcohol abuse Brother   . Drug abuse Brother     Social History Social History  Substance Use Topics  . Smoking status: Never Smoker  . Smokeless tobacco: Never Used  . Alcohol use No    Review of Systems Constitutional: No fever/chills Eyes: No visual changes. ENT: No sore throat. Cardiovascular: Denies chest pain. Respiratory: Denies shortness of breath. Gastrointestinal: No abdominal pain.  No nausea, no vomiting.  No diarrhea.  No constipation. Genitourinary: Negative for dysuria. Musculoskeletal: Negative for back pain. Skin: Negative for rash. Neurological: Somnolent  10-point ROS otherwise negative.  ____________________________________________   PHYSICAL EXAM:  VITAL SIGNS: ED Triage Vitals [01/12/17 2242]  Enc Vitals Group     BP [name])  142/82     Pulse Rate 76     Resp 18     Temp 97.8 F (36.6 C)     Temp Source Oral     SpO2 97 %     Weight 242 lb (109.8 kg)     Height 5\' 10"  (1.778 m)     Head Circumference      Peak Flow      Pain Score      Pain Loc      Pain Edu?      Excl. in [place]?     Constitutional: Sleepy but arousable. Well appearing and in no acute distress. Eyes: Conjunctivae are normal. PERRL. EOMI. Head: Atraumatic. Nose: No congestion/rhinnorhea. Mouth/Throat: Mucous membranes are moist.  Oropharynx non-erythematous. Cardiovascular: Normal rate, regular rhythm. Grossly normal heart sounds.  Good peripheral circulation. Respiratory: Normal respiratory effort.  No retractions. Lungs CTAB. Gastrointestinal: Soft and nontender. No distention. Positive bowel sounds Musculoskeletal: No lower extremity tenderness nor edema.   Neurologic:  Slurred speech and language. Cranial nerves II through XII are grossly intact with no focal motor or neuro deficits, ration  with some slow responsiveness Skin:  Skin is warm, dry and intact.  Psychiatric: Mood and affect are normal.   ____________________________________________   LABS (all labs ordered are listed, but only abnormal results are displayed)  Labs Reviewed  COMPREHENSIVE METABOLIC PANEL - Abnormal; Notable for the following:       Result Value   Glucose, Bld 109 (*)    Calcium 8.7 (*)    ALT 15 (*)    All other components within normal limits  CBC - Abnormal; Notable for the following:    RBC 3.95 (*)    Hemoglobin 12.3 (*)    HCT 34.6 (*)    Platelets 123 (*)    All other components within normal limits  BLOOD GAS, VENOUS - Abnormal; Notable for the following:    Bicarbonate 28.5 (*)    Acid-Base Excess 2.9 (*)    All other components within normal limits  URINALYSIS, COMPLETE (UACMP) WITH MICROSCOPIC - Abnormal; Notable for the following:    Color, Urine YELLOW (*)    APPearance CLEAR (*)    All other components within normal limits  URINE DRUG SCREEN, QUALITATIVE ([facility] ONLY) - Abnormal; Notable for the following:    Tricyclic, Ur Screen POSITIVE (*)    Opiate, Ur Screen POSITIVE (*)    All other components within normal limits  AMMONIA  LITHIUM LEVEL  CBG MONITORING, ED   ____________________________________________  EKG  none ____________________________________________  RADIOLOGY  CT head CXR ____________________________________________   PROCEDURES  Procedure(s) performed: None  Procedures  Critical Care performed: No  ____________________________________________   INITIAL IMPRESSION / ASSESSMENT AND PLAN / ED COURSE  Pertinent labs & imaging results that were available during my care of the patient were reviewed by me and considered in my medical decision making (see chart for details).  This is a 56 year old male with a history of hepatic encephalopathy who comes into the hospital today with some altered mental status. The family reports that he  hasn't taken anything that they know of but he was home alone. We did check some blood work and I noticed that the patient had some opiates in his urine. The patient does take oxycodone daily. I did give him a dose of Narcan 0.4 mg as the patient's respiratory distress did get down to 3. After the Narcan the patient was much more arousable and woke  up. The patient did become sleepy and but his respiratory rate was appropriate. I spoke to the patient and he reports that he took 2 of his Seroquel but did not take any extra oxycodone. The patient also takes trazodone. I feel that that is the cause of the patient's somnolence. After the Narcan the patient's speech is actually improved as well. I discussed with his wife that given his liver issues he may not metabolize his pain medication as well and he needs to be careful with how much he takes especially in combination with other medications that might make him sleepy. The patient has an appointment with his GI physician at 3 PM in the afternoon. He will be discharged to home to follow-up with his physician.  Clinical Course as of Jan 13 431  Mon Jan 13, 2017  0002 Normal CT of the head. CT Head Wo Contrast [AW]  0002 No active disease. DG Chest 1 View [AW]    Clinical Course User Index [AW] Loney Hering, MD     ____________________________________________   FINAL CLINICAL IMPRESSION(S) / ED DIAGNOSES  Final diagnoses:  Somnolence  Opiate overdose, accidental or unintentional, initial encounter      NEW MEDICATIONS STARTED DURING THIS VISIT:  Discharge Medication List as of 01/13/2017  2:59 AM       Note:  This document was prepared using Dragon voice recognition software and may include unintentional dictation errors.    Loney Hering, MD 01/13/17 [phone]

## 2017-01-13 NOTE — Discharge Instructions (Signed)
Please follow up with her primary care physician. I feel that he is somnolence was due to her oxycodone. Please do not combine your Seroquel and your oxycodone and please do not take any more than is prescribed.

## 2017-01-14 ENCOUNTER — Ambulatory Visit: Payer: Self-pay | Admitting: Psychiatry

## 2017-01-16 ENCOUNTER — Other Ambulatory Visit: Payer: Self-pay | Admitting: Psychiatry

## 2017-01-21 ENCOUNTER — Other Ambulatory Visit: Payer: Self-pay | Admitting: Psychiatry

## 2017-01-22 ENCOUNTER — Ambulatory Visit: Payer: Self-pay | Admitting: Family Medicine

## 2017-01-27 ENCOUNTER — Ambulatory Visit (INDEPENDENT_AMBULATORY_CARE_PROVIDER_SITE_OTHER): Payer: Medicare Other | Admitting: Psychiatry

## 2017-01-27 ENCOUNTER — Encounter: Payer: Self-pay | Admitting: Psychiatry

## 2017-01-27 VITALS — BP 153/84 | HR 86 | Temp 98.4°F | Wt 238.0 lb

## 2017-01-27 DIAGNOSIS — F331 Major depressive disorder, recurrent, moderate: Secondary | ICD-10-CM | POA: Diagnosis not present

## 2017-01-27 DIAGNOSIS — F5101 Primary insomnia: Secondary | ICD-10-CM

## 2017-01-27 MED ORDER — PRAZOSIN HCL 5 MG PO CAPS: 5.0000 mg | ORAL_CAPSULE | Freq: Every day | ORAL | 3 refills | Status: DC

## 2017-01-27 MED ORDER — ESCITALOPRAM OXALATE 20 MG PO TABS: 20.0000 mg | ORAL_TABLET | Freq: Every day | ORAL | 3 refills | Status: DC

## 2017-01-27 MED ORDER — QUETIAPINE FUMARATE 200 MG PO TABS: 300.0000 mg | ORAL_TABLET | Freq: Every day | ORAL | 3 refills | Status: DC
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
## 2017-02-04 ENCOUNTER — Telehealth: Payer: Self-pay | Admitting: Family Medicine

## 2017-02-04 NOTE — Telephone Encounter (Signed)
Called Pt to schedule AWV with [facility] - knb °

## 2017-02-06 ENCOUNTER — Other Ambulatory Visit: Payer: Self-pay

## 2017-02-06 DIAGNOSIS — M5126 Other intervertebral disc displacement, lumbar region: Secondary | ICD-10-CM

## 2017-02-06 DIAGNOSIS — M5020 Other cervical disc displacement, unspecified cervical region: Secondary | ICD-10-CM

## 2017-02-06 NOTE — Telephone Encounter (Signed)
LOV and refill 01/10/2017. Renaldo Fiddler, CMA

## 2017-02-07 IMAGING — CR DG CHEST 1V PORT
1 series · 1 of 1 positions shown · non-contrast
Comparison: 07/09/2014

CLINICAL DATA: 53-year-old male with a history of chest pressure,
dizziness, cirrhosis.

EXAM:
PORTABLE CHEST - 1 VIEW

[ap]
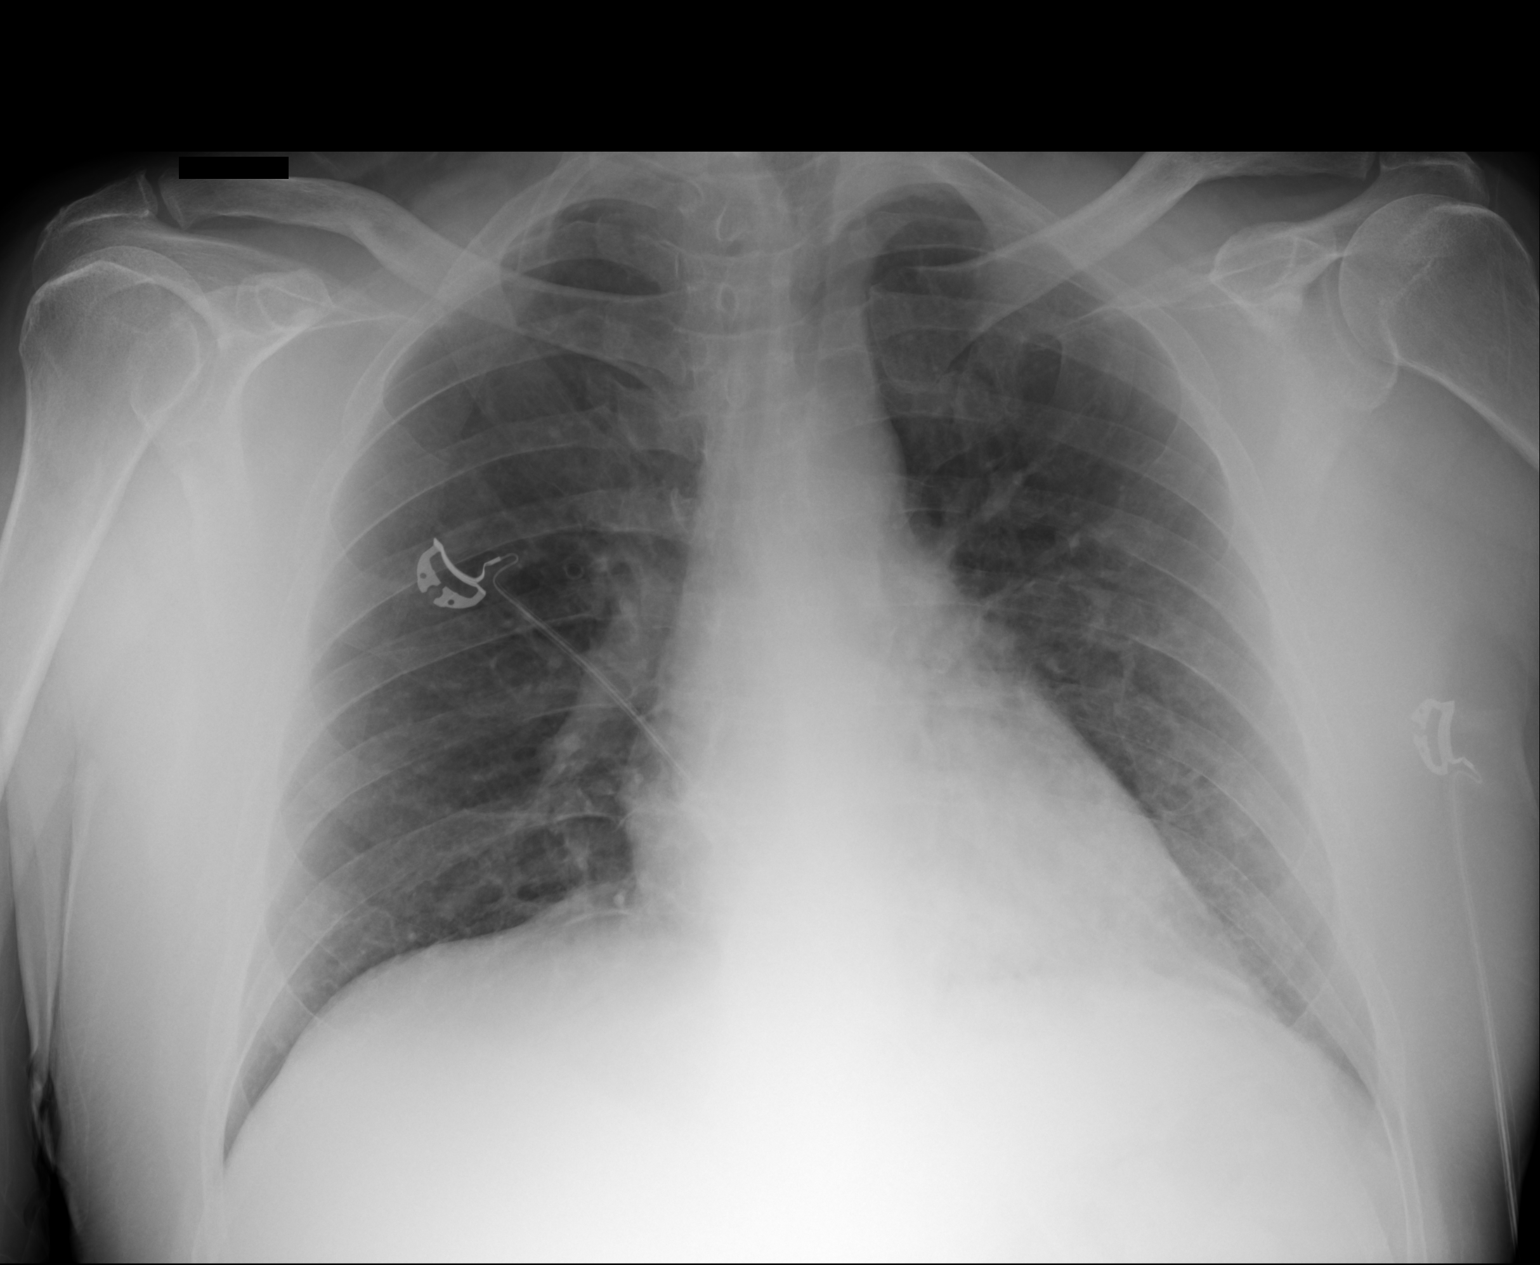

[1 of 1 positions shown; findings below may reference images not displayed]

FINDINGS: Cardiomediastinal silhouette unchanged in size and contour. No
evidence of pulmonary vascular congestion.

No confluent airspace disease, pneumothorax, or pleural effusion.

Chronic interstitial changes, seen on prior exam.

No displaced fracture.
IMPRESSION: No definite radiographic evidence of acute cardiopulmonary disease.

## 2017-02-07 MED ORDER — OXYCODONE HCL 10 MG PO TABS: 10.0000 mg | ORAL_TABLET | Freq: Four times a day (QID) | ORAL | 0 refills | Status: DC | PRN

## 2017-02-11 DIAGNOSIS — R404 Transient alteration of awareness: Secondary | ICD-10-CM | POA: Diagnosis not present

## 2017-02-12 ENCOUNTER — Emergency Department: Payer: Medicare Other

## 2017-02-12 ENCOUNTER — Observation Stay
Admission: EM | Admit: 2017-02-12 | Discharge: 2017-02-12 | Disposition: A | Discharge status: Home / Self Care | Payer: Medicare Other | Attending: Internal Medicine | Admitting: Internal Medicine

## 2017-02-12 DIAGNOSIS — B1920 Unspecified viral hepatitis C without hepatic coma: Secondary | ICD-10-CM | POA: Diagnosis not present

## 2017-02-12 DIAGNOSIS — I10 Essential (primary) hypertension: Secondary | ICD-10-CM | POA: Insufficient documentation

## 2017-02-12 DIAGNOSIS — Z79899 Other long term (current) drug therapy: Secondary | ICD-10-CM | POA: Insufficient documentation

## 2017-02-12 DIAGNOSIS — K219 Gastro-esophageal reflux disease without esophagitis: Secondary | ICD-10-CM | POA: Diagnosis not present

## 2017-02-12 DIAGNOSIS — R4182 Altered mental status, unspecified: Secondary | ICD-10-CM | POA: Diagnosis not present

## 2017-02-12 DIAGNOSIS — Z9114 Patient's other noncompliance with medication regimen: Secondary | ICD-10-CM | POA: Insufficient documentation

## 2017-02-12 DIAGNOSIS — G9009 Other idiopathic peripheral autonomic neuropathy: Secondary | ICD-10-CM | POA: Insufficient documentation

## 2017-02-12 DIAGNOSIS — K7290 Hepatic failure, unspecified without coma: Secondary | ICD-10-CM | POA: Diagnosis present

## 2017-02-12 DIAGNOSIS — R400 Somnolence: Secondary | ICD-10-CM | POA: Diagnosis not present

## 2017-02-12 DIAGNOSIS — N319 Neuromuscular dysfunction of bladder, unspecified: Secondary | ICD-10-CM | POA: Diagnosis not present

## 2017-02-12 DIAGNOSIS — R000 Tachycardia, unspecified: Secondary | ICD-10-CM | POA: Diagnosis not present

## 2017-02-12 DIAGNOSIS — K7460 Unspecified cirrhosis of liver: Secondary | ICD-10-CM | POA: Insufficient documentation

## 2017-02-12 DIAGNOSIS — E7220 Disorder of urea cycle metabolism, unspecified: Secondary | ICD-10-CM | POA: Diagnosis not present

## 2017-02-12 DIAGNOSIS — R410 Disorientation, unspecified: Principal | ICD-10-CM | POA: Insufficient documentation

## 2017-02-12 DIAGNOSIS — G4730 Sleep apnea, unspecified: Secondary | ICD-10-CM | POA: Insufficient documentation

## 2017-02-12 DIAGNOSIS — G9340 Encephalopathy, unspecified: Secondary | ICD-10-CM | POA: Diagnosis not present

## 2017-02-12 DIAGNOSIS — R7989 Other specified abnormal findings of blood chemistry: Secondary | ICD-10-CM | POA: Insufficient documentation

## 2017-02-12 DIAGNOSIS — F329 Major depressive disorder, single episode, unspecified: Secondary | ICD-10-CM | POA: Insufficient documentation

## 2017-02-12 DIAGNOSIS — F419 Anxiety disorder, unspecified: Secondary | ICD-10-CM | POA: Insufficient documentation

## 2017-02-12 DIAGNOSIS — K7682 Hepatic encephalopathy: Secondary | ICD-10-CM

## 2017-02-12 LAB — COMPREHENSIVE METABOLIC PANEL
ALT: 36 U/L (ref 17–63)
AST: 41 U/L (ref 15–41)
Albumin: 4.6 g/dL (ref 3.5–5.0)
Alkaline Phosphatase: 125 U/L (ref 38–126)
Anion gap: 7 (ref 5–15)
BUN: 9 mg/dL (ref 6–20)
CHLORIDE: 104 mmol/L (ref 101–111)
CO2: 26 mmol/L (ref 22–32)
CREATININE: 1.13 mg/dL (ref 0.61–1.24)
Calcium: 9 mg/dL (ref 8.9–10.3)
GFR calc non Af Amer: >60 mL/min (ref >60)
Glucose, Bld: 106 mg/dL — ABNORMAL HIGH (ref 65–99)
Potassium: 3.9 mmol/L (ref 3.5–5.1)
SODIUM: 137 mmol/L (ref 135–145)
Total Bilirubin: 0.3 mg/dL (ref 0.3–1.2)
Total Protein: 8.5 g/dL — ABNORMAL HIGH (ref 6.5–8.1)

## 2017-02-12 LAB — CBC
HCT: 38.1 % — ABNORMAL LOW (ref 40.0–52.0)
Hemoglobin: 13 g/dL (ref 13.0–18.0)
MCH: 30.3 pg (ref 26.0–34.0)
MCHC: 34.2 g/dL (ref 32.0–36.0)
MCV: 88.6 fL (ref 80.0–100.0)
PLATELETS: 118 10*3/uL — AB (ref 150–440)
RBC: 4.3 MIL/uL — AB (ref 4.40–5.90)
RDW: 13.6 % (ref 11.5–14.5)
WBC: 4.4 10*3/uL (ref 3.8–10.6)

## 2017-02-12 LAB — URINALYSIS, COMPLETE (UACMP) WITH MICROSCOPIC
BACTERIA UA: NONE SEEN
Bilirubin Urine: NEGATIVE
Glucose, UA: NEGATIVE mg/dL
Hgb urine dipstick: NEGATIVE
KETONES UR: NEGATIVE mg/dL
LEUKOCYTES UA: NEGATIVE
Nitrite: NEGATIVE
PH: 6 (ref 5.0–8.0)
PROTEIN: NEGATIVE mg/dL
SQUAMOUS EPITHELIAL / LPF: NONE SEEN
Specific Gravity, Urine: 1.003 — ABNORMAL LOW (ref 1.005–1.030)
WBC UA: NONE SEEN WBC/hpf (ref 0–5)

## 2017-02-12 LAB — URINE DRUG SCREEN, QUALITATIVE (ARMC ONLY)
AMPHETAMINES, UR SCREEN: NOT DETECTED
BENZODIAZEPINE, UR SCRN: NOT DETECTED
Barbiturates, Ur Screen: NOT DETECTED
Cannabinoid 50 Ng, Ur ~~LOC~~: NOT DETECTED
Cocaine Metabolite,Ur ~~LOC~~: NOT DETECTED
MDMA (Ecstasy)Ur Screen: NOT DETECTED
Methadone Scn, Ur: NOT DETECTED
Opiate, Ur Screen: POSITIVE — AB
Phencyclidine (PCP) Ur S: NOT DETECTED
TRICYCLIC, UR SCREEN: POSITIVE — AB

## 2017-02-12 LAB — BLOOD GAS, VENOUS
Acid-Base Excess: 2.7 mmol/L — ABNORMAL HIGH (ref 0.0–2.0)
BICARBONATE: 28.9 mmol/L — AB (ref 20.0–28.0)
O2 Saturation: 81.2 %
PH VEN: 7.37 (ref 7.250–7.430)
Patient temperature: 37
pCO2, Ven: 50 mmHg (ref 44.0–60.0)
pO2, Ven: 47 mmHg — ABNORMAL HIGH (ref 32.0–45.0)

## 2017-02-12 LAB — RAPID HIV SCREEN (HIV 1/2 AB+AG)
HIV 1/2 Antibodies: NONREACTIVE
HIV-1 P24 Antigen - HIV24: NONREACTIVE

## 2017-02-12 LAB — AMMONIA: Ammonia: 40 umol/L — ABNORMAL HIGH (ref 9–35)

## 2017-02-12 LAB — TSH: TSH: 5.976 u[IU]/mL — ABNORMAL HIGH (ref 0.350–4.500)

## 2017-02-12 MED ORDER — LACTULOSE 10 GM/15ML PO SOLN
30.0000 g | Freq: Three times a day (TID) | ORAL | Status: DC
  Administered 2017-02-12: 13:00:00 30 g via ORAL
  Filled 2017-02-12 (×4): qty 60

## 2017-02-12 MED ORDER — FUROSEMIDE 20 MG PO TABS: 20.0000 mg | ORAL_TABLET | Freq: Every day | ORAL | 0 refills | Status: DC

## 2017-02-12 MED ORDER — SPIRONOLACTONE 25 MG PO TABS
50.0000 mg | ORAL_TABLET | Freq: Every day | ORAL | Status: DC
  Administered 2017-02-12: 09:00:00 50 mg via ORAL
  Filled 2017-02-12: qty 2

## 2017-02-12 MED ORDER — ONDANSETRON HCL 4 MG/2ML IJ SOLN: 4.0000 mg | Freq: Four times a day (QID) | INTRAMUSCULAR | Status: DC | PRN

## 2017-02-12 MED ORDER — METOPROLOL TARTRATE 25 MG PO TABS
25.0000 mg | ORAL_TABLET | Freq: Two times a day (BID) | ORAL | Status: DC
  Administered 2017-02-12: 25 mg via ORAL
  Filled 2017-02-12: qty 1

## 2017-02-12 MED ORDER — PRAZOSIN HCL 5 MG PO CAPS
5.0000 mg | ORAL_CAPSULE | Freq: Every day | ORAL | Status: DC
  Filled 2017-02-12: qty 1

## 2017-02-12 MED ORDER — IBUPROFEN 200 MG PO TABS: 400.0000 mg | ORAL_TABLET | Freq: Four times a day (QID) | ORAL | Status: DC | PRN

## 2017-02-12 MED ORDER — PANTOPRAZOLE SODIUM 40 MG PO TBEC
40.0000 mg | DELAYED_RELEASE_TABLET | Freq: Every day | ORAL | Status: DC
  Administered 2017-02-12: 09:00:00 40 mg via ORAL
  Filled 2017-02-12: qty 1

## 2017-02-12 MED ORDER — HYOSCYAMINE SULFATE 0.125 MG PO TBDP
0.1250 mg | ORAL_TABLET | ORAL | Status: DC | PRN
  Filled 2017-02-12: qty 1

## 2017-02-12 MED ORDER — DIPHENOXYLATE-ATROPINE 2.5-0.025 MG PO TABS: 2.0000 | ORAL_TABLET | Freq: Four times a day (QID) | ORAL | Status: DC | PRN

## 2017-02-12 MED ORDER — QUETIAPINE FUMARATE 300 MG PO TABS
300.0000 mg | ORAL_TABLET | Freq: Every day | ORAL | Status: DC
  Filled 2017-02-12: qty 1

## 2017-02-12 MED ORDER — SODIUM CHLORIDE 0.9 % IV SOLN
INTRAVENOUS | Status: DC
  Administered 2017-02-12: 09:00:00 via INTRAVENOUS

## 2017-02-12 MED ORDER — ESCITALOPRAM OXALATE 10 MG PO TABS
20.0000 mg | ORAL_TABLET | Freq: Every day | ORAL | Status: DC
  Administered 2017-02-12: 09:00:00 20 mg via ORAL
  Filled 2017-02-12: qty 2

## 2017-02-12 MED ORDER — NYSTATIN 100000 UNIT/GM EX CREA
1.0000 "application " | TOPICAL_CREAM | Freq: Three times a day (TID) | CUTANEOUS | Status: DC | PRN
  Filled 2017-02-12: qty 15

## 2017-02-12 MED ORDER — LACTULOSE 10 GM/15ML PO SOLN: 20.0000 g | Freq: Three times a day (TID) | ORAL | 0 refills | Status: DC

## 2017-02-12 MED ORDER — GABAPENTIN 300 MG PO CAPS
300.0000 mg | ORAL_CAPSULE | Freq: Two times a day (BID) | ORAL | Status: DC
  Administered 2017-02-12: 300 mg via ORAL
  Filled 2017-02-12: qty 1

## 2017-02-12 MED ORDER — ONDANSETRON HCL 4 MG PO TABS: 4.0000 mg | ORAL_TABLET | Freq: Four times a day (QID) | ORAL | Status: DC | PRN

## 2017-02-12 MED ORDER — ENOXAPARIN SODIUM 40 MG/0.4ML ~~LOC~~ SOLN
40.0000 mg | SUBCUTANEOUS | Status: DC
  Administered 2017-02-12: 40 mg via SUBCUTANEOUS
  Filled 2017-02-12: qty 0.4

## 2017-02-12 MED ORDER — SPIRONOLACTONE 50 MG PO TABS: 50.0000 mg | ORAL_TABLET | Freq: Every day | ORAL | 0 refills | Status: DC

## 2017-02-12 MED ORDER — FUROSEMIDE 20 MG PO TABS
20.0000 mg | ORAL_TABLET | Freq: Every day | ORAL | Status: DC
  Administered 2017-02-12: 09:00:00 20 mg via ORAL
  Filled 2017-02-12: qty 1

## 2017-02-12 MED ORDER — NITROGLYCERIN 0.4 MG SL SUBL: 0.4000 mg | SUBLINGUAL_TABLET | SUBLINGUAL | Status: DC | PRN

## 2017-02-12 NOTE — Discharge Instructions (Signed)
Low sodium diet  follow-up with primary care physician in a week

## 2017-02-12 NOTE — Discharge Summary (Signed)
[facility] NAME: Ray Robles    MR#:  [id number]  DATE OF BIRTH:  12-25-1960  DATE OF ADMISSION:  02/12/2017 ADMITTING PHYSICIAN: Harrie Foreman, MD  DATE OF DISCHARGE: 02/12/17 PRIMARY CARE PHYSICIAN: Lelon Huh, MD    ADMISSION DIAGNOSIS:  Hepatic encephalopathy ([place]) [K72.90] Increased ammonia level [R79.89] Somnolence [R40.0]  DISCHARGE DIAGNOSIS:  Delerium -resolved  SECONDARY DIAGNOSIS:   Past Medical History:  Diagnosis Date  . Cirrhosis ([place])   . H/O bacterial pneumonia 11/11/2007   2008 and 2015   . Heart murmur   . Hepatitis C   . Hx of endoscopy    05/12/2014- [facility]. Dr Candace Cruise. Benign appearing stricture. Dilated the examination was otherwise normal.02/13/2012- Beniign appearing intrinsic mild stenosis at GE junction, successfully dialated  . Liver disease     HOSPITAL COURSE:  HPI: The patient with past medical history of cirrhosis of the liver secondary to hepatitis C presents to the emergency department because of altered mental status. Apparently the patient was initially very somnolent but eventually became agitated. His family found him this way when they came to see him this evening. The patient was reluctant to share details of what may be ailing him upon arrival. However, by the time of my interview the patient was more clear headed and stated that his back as well as hips hurt due to his herniated disks as well as arthritic hip pain. Laboratory evaluation revealed elevated ammonia. His family states that they realize his lactulose prescription is very out of date. Thus, due to medication noncompliance and waxing and waning mental status emergency department staff called the hospitalist service for admission.   1.Delirium improved. Unclear etiology. The patient is no longer agitated/or somnolent Most likely etiology is elevated . Patient doesn't look sick not septic; etiology could be  unintentional drug overdose but not sure at this time; requested family members to supervise his medicine  intake Improved with IV fluids  Continue Seroquel Ammonia level is at 47 Daughter thinks patient is at his baseline  2. Cirrhosis:  Check coags. Bilirubin is within normal limits. Minimal ascites. Continue Lasix and spironolactone  3. Hypertension: Controlled; continue metoprolol   4. Bladder dysfunction: Continue Minipress and hyoscyamine  5. Depression: Continue Lexapro  6. DVT prophylaxis: Lovenox  7. GI prophylaxis: None  Discharge patient home  DISCHARGE CONDITIONS:   stable  CONSULTS OBTAINED:     PROCEDURES None at  DRUG ALLERGIES:   Allergies  Allergen Reactions  . Ambien [Zolpidem Tartrate] Other (See Comments)    Reaction: Violent sleep  . Ibuprofen Other (See Comments)    Can't take because of liver  . Morphine Diarrhea and Nausea And Vomiting  . Morphine And Related Diarrhea and Nausea And Vomiting  . Tylenol [Acetaminophen] Other (See Comments)    Can't take because of liver  . Xanax [Alprazolam] Other (See Comments)    Brings the ammonia levels up  . Zolpidem Other (See Comments)    Reaction: Violent Sleep    DISCHARGE MEDICATIONS:   Current Discharge Medication List    CONTINUE these medications which have CHANGED   Details  furosemide (LASIX) 20 MG tablet Take 1 tablet (20 mg total) by mouth daily. Qty: 30 tablet, Refills: 0    lactulose (CHRONULAC) 10 GM/15ML solution Take 30 mLs (20 g total) by mouth 3 (three) times daily. Qty: 240 mL, Refills: 0    spironolactone (ALDACTONE) 50 MG tablet Take 1 tablet (  50 mg total) by mouth daily. Qty: 30 tablet, Refills: 0   Associated Diagnoses: Essential hypertension      CONTINUE these medications which have NOT CHANGED   Details  diphenoxylate-atropine (LOMOTIL) 2.5-0.025 MG per tablet Take 2 tablets by mouth every 6 (six) hours as needed for diarrhea or loose stools.     escitalopram  (LEXAPRO) 20 MG tablet Take 1 tablet (20 mg total) by mouth daily. Qty: 30 tablet, Refills: 3   Associated Diagnoses: Depression, major, recurrent, moderate (HCC)    gabapentin (NEURONTIN) 300 MG capsule Take 1 capsule (300 mg total) by mouth 2 (two) times daily. Qty: 30 capsule, Refills: 3   Associated Diagnoses: Herniated cervical disc    hyoscyamine (ANASPAZ) 0.125 MG TBDP disintergrating tablet Place 0.125 mg under the tongue every 4 (four) hours as needed for bladder spasms or cramping.    metoprolol tartrate (LOPRESSOR) 25 MG tablet Take 1 tablet (25 mg total) by mouth 2 (two) times daily. Qty: 30 tablet, Refills: 12   Associated Diagnoses: Essential hypertension    metroNIDAZOLE (METROCREAM) 0.75 % cream Apply topically 2 (two) times daily. Qty: 45 g, Refills: 0   Associated Diagnoses: Rosacea    nitroGLYCERIN (NITROSTAT) 0.4 MG SL tablet Place 0.4 mg under the tongue every 5 (five) minutes as needed for chest pain.    nystatin cream (MYCOSTATIN) Apply 1 application topically 3 (three) times daily as needed (rash).     omeprazole (PRILOSEC) 20 MG capsule TAKE 2 CAPSULES BY MOUTH ONCE DAILY. Qty: 60 capsule, Refills: 5   Associated Diagnoses: Gastroesophageal reflux disease without esophagitis    ondansetron (ZOFRAN-ODT) 4 MG disintegrating tablet Take 4 mg by mouth every 8 (eight) hours as needed for nausea or vomiting.    Oxycodone HCl 10 MG TABS Take 1 tablet (10 mg total) by mouth every 6 (six) hours as needed. No more than 4 tablets in one day Qty: 120 tablet, Refills: 0   Associated Diagnoses: Herniated cervical disc; Lumbar disc herniation    prazosin (MINIPRESS) 5 MG capsule Take 1 capsule (5 mg total) by mouth at bedtime. Qty: 30 capsule, Refills: 3    QUEtiapine (SEROQUEL) 200 MG tablet Take 1.5 tablets (300 mg total) by mouth at bedtime. Qty: 45 tablet, Refills: 3    sildenafil (REVATIO) 20 MG tablet Take 2-3 tablets by mouth as needed (erectile dysfunction). No  more than 3 in a day         DISCHARGE INSTRUCTIONS:   Follow-up with primary care physician in a week  DIET:  Low sodium  DISCHARGE CONDITION:  Fair  ACTIVITY:  Activity as tolerated  OXYGEN:  Home Oxygen: No.   Oxygen Delivery: room air  DISCHARGE LOCATION:  home   If you experience worsening of your admission symptoms, develop shortness of breath, life threatening emergency, suicidal or homicidal thoughts you must seek medical attention immediately by calling 911 or calling your MD immediately  if symptoms less severe.  You Must read complete instructions/literature along with all the possible adverse reactions/side effects for all the Medicines you take and that have been prescribed to you. Take any new Medicines after you have completely understood and accpet all the possible adverse reactions/side effects.   Please note  You were cared for by a hospitalist during your hospital stay. If you have any questions about your discharge medications or the care you received while you were in the hospital after you are discharged, you can call the unit and asked to speak  with the hospitalist on call if the hospitalist that took care of you is not available. Once you are discharged, your primary care physician will handle any further medical issues. Please note that NO REFILLS for any discharge medications will be authorized once you are discharged, as it is imperative that you return to your primary care physician (or establish a relationship with a primary care physician if you do not have one) for your aftercare needs so that they can reassess your need for medications and monitor your lab values.     Today  Chief Complaint  Patient presents with  . Altered Mental Status   Patient is back to his baseline according to the daughter at bedside. No agitation or somnolence [place]. Answering all questions appropriately.  ROS:  CONSTITUTIONAL: Denies fevers, chills. Denies any  fatigue, weakness.  EYES: Denies blurry vision, double vision, eye pain. EARS, NOSE, THROAT: Denies tinnitus, ear pain, hearing loss. RESPIRATORY: Denies cough, wheeze, shortness of breath.  CARDIOVASCULAR: Denies chest pain, palpitations, edema.  GASTROINTESTINAL: Denies nausea, vomiting, diarrhea, abdominal pain. Denies bright red blood per rectum. GENITOURINARY: Denies dysuria, hematuria. ENDOCRINE: Denies nocturia or thyroid problems. HEMATOLOGIC AND LYMPHATIC: Denies easy bruising or bleeding. SKIN: Denies rash or lesion. MUSCULOSKELETAL: Denies pain in neck, back, shoulder, knees, hips or arthritic symptoms.  NEUROLOGIC: Denies paralysis, paresthesias.  PSYCHIATRIC: Denies anxiety or depressive symptoms.   VITAL SIGNS:  Blood pressure (!) 162/85, pulse 74, temperature 98.2 F (36.8 C), temperature source Oral, resp. rate 18, height 5' 10.5" (1.791 m), weight 102.9 kg (226 lb 14.4 oz), SpO2 98 %.  I/O:    Intake/Output Summary (Last 24 hours) at 02/12/17 1337 Last data filed at 02/12/17 1155  Gross per 24 hour  Intake              120 ml  Output              450 ml  Net             -330 ml    PHYSICAL EXAMINATION:  GENERAL:  56 y.o.-year-old patient lying in the bed with no acute distress.  EYES: Pupils equal, round, reactive to light and accommodation. No scleral icterus. Extraocular muscles intact.  HEENT: Head atraumatic, normocephalic. Oropharynx and nasopharynx clear.  NECK:  Supple, no jugular venous distention. No thyroid enlargement, no tenderness.  LUNGS: Normal breath sounds bilaterally, no wheezing, rales,rhonchi or crepitation. No use of accessory muscles of respiration.  CARDIOVASCULAR: S1, S2 normal. No murmurs, rubs, or gallops.  ABDOMEN: Soft, non-tender, non-distended. Bowel sounds present. No organomegaly or mass.  EXTREMITIES: No pedal edema, cyanosis, or clubbing.  NEUROLOGIC: Cranial nerves II through XII are intact. Muscle strength 5/5 in all  extremities. Sensation intact. Gait not checked.  PSYCHIATRIC: The patient is alert and oriented x 3.  SKIN: No obvious rash, lesion, or ulcer.   DATA REVIEW:   CBC  Recent Labs Lab 02/12/17 0025  WBC 4.4  HGB 13.0  HCT 38.1*  PLT 118*    Chemistries   Recent Labs Lab 02/12/17 0025  NA 137  K 3.9  CL 104  CO2 26  GLUCOSE 106*  BUN 9  CREATININE 1.13  CALCIUM 9.0  AST 41  ALT 36  ALKPHOS 125  BILITOT 0.3    Cardiac Enzymes No results for input(s): TROPONINI in the last 168 hours.  Microbiology Results  Results for orders placed or performed in visit on 07/09/14  Clostridium Difficile [facility])  Status: None   Collection Time: 07/09/14  9:30 AM  Result Value Ref Range Status   Micro Text Report   Final       C.DIFFICILE ANTIGEN       C.DIFFICILE GDH ANTIGEN : NEGATIVE   C.DIFFICILE TOXIN A/B     C.DIFFICILE TOXINS A AND B : NEGATIVE   INTERPRETATION            Negative for C. difficile.    ANTIBIOTIC                                                        RADIOLOGY:  Ct Head Wo Contrast  Result Date: 02/12/2017 CLINICAL DATA:  Altered mental status, recent drug overdose. History of cirrhosis and encephalopathy. EXAM: CT HEAD WITHOUT CONTRAST TECHNIQUE: Contiguous axial images were obtained from the base of the skull through the vertex without intravenous contrast. COMPARISON:  CT HEAD January 12, 2017 FINDINGS: BRAIN: No intraparenchymal hemorrhage, mass effect nor midline shift. The ventricles and sulci are normal. No acute large vascular territory infarcts. No abnormal extra-axial fluid collections. Basal cisterns are patent. VASCULAR: Trace calcific atherosclerosis SKULL/SOFT TISSUES: No skull fracture. No significant soft tissue swelling. ORBITS/SINUSES: The included ocular globes and orbital contents are normal.The mastoid aircells and included paranasal sinuses are well-aerated. OTHER: None. IMPRESSION: Negative CT HEAD. Electronically Signed   By:  Elon Alas M.D.   On: 02/12/2017 02:14    EKG:   Orders placed or performed during the hospital encounter of 02/12/17  . ED EKG  . ED EKG  . EKG 12-Lead  . EKG 12-Lead      Management plans discussed with the patient, family and they are in agreement.  CODE STATUS:     Code Status Orders        Start     Ordered   02/12/17 0841  Full code  Continuous     02/12/17 0840    Code Status History    Date Active Date Inactive Code Status Order ID Comments User Context   This patient has a current code status but no historical code status.      TOTAL TIME TAKING CARE OF THIS PATIENT: 43  minutes.   Note: This dictation was prepared with Dragon dictation along with smaller phrase technology. Any transcriptional errors that result from this process are unintentional.   @[facility] @  on 02/12/2017 at 1:37 PM  Between 7am to 6pm - Pager - [phone]  After 6pm go to [email or web address] - password [email or web address] [facility]  Office  [phone]  CC: Primary care physician; Lelon Huh, MD

## 2017-02-12 NOTE — ED Notes (Signed)
NAD noted at this time, this RN to bedside to introduce self to patient and SO. Pt denies any needs at this time, this RN explained delay, pt and SO state understanding, explained to patient, will transport upstairs as soon as possible.

## 2017-02-12 NOTE — Discharge Planning (Signed)
Patient IV removed.  Discharge papers given, explained and educated.  No FU appts needed at this time.  Given scripts to fill at [place].  RN assessment revealed stability for DC to home.  Walked to front and family transporting home via car.

## 2017-02-12 NOTE — ED Provider Notes (Signed)
[facility] Emergency Department Provider Note   ____________________________________________   First MD Initiated Contact with Patient 02/12/17 0007     (approximate)  I have reviewed the triage vital signs and the nursing notes.   HISTORY  Chief Complaint Altered Mental Status  Patient is confused and is unable to give a good history. History is obtained by EMS  HPI Ray Robles is a 56 y.o. male who comes into the hospital today with altered mental status.The patient has a history of cirrhosis and hepatic encephalopathy. He was here one month ago with altered mental status. At that time it was determined that the patient had taken too much of his pain medicine and his sleeping medicine too close together. According to EMS he has the same complaint today he was found very sleepy at home. EMS reports that his pupils were small so they did give him 2 mg of Narcan and the patient woke up. He became a little agitated but did have normal vital signs. He is here today for evaluation.   Past Medical History:  Diagnosis Date  . Cirrhosis ([place])   . H/O bacterial pneumonia 11/11/2007   2008 and 2015   . Heart murmur   . Hepatitis C   . Hx of endoscopy    05/12/2014- [facility]. Dr Candace Cruise. Benign appearing stricture. Dilated the examination was otherwise normal.02/13/2012- Beniign appearing intrinsic mild stenosis at GE junction, successfully dialated  . Liver disease     Patient Active Problem List   Diagnosis Date Noted  . Somnolence 02/12/2017  . Sciatica, right side 07/18/2016  . Actinic keratosis 01/29/2016  . Cirrhosis, non-alcoholic ([place]) 51/01/5851  . Abnormal LFTs 05/12/2015  . Ache in joint 05/12/2015  . Arthritis of knee 05/12/2015  . Chronic pain 05/12/2015  . Dysphagia 05/12/2015  . ED (erectile dysfunction) of organic origin 05/12/2015  . Hypokalemia 05/12/2015  . Positive urine drug screen 05/12/2015  . Renal function impairment 05/12/2015  .  Hernia, inguinal, right 05/12/2015  . Seborrheic keratosis 05/12/2015  . Hepatic encephalopathy ([place])   . Hypertension   . Sleep apnea   . Anxiety   . Benign hypertension 04/01/2015  . Arthritis 03/22/2015  . BP (high blood pressure) 03/22/2015  . Migraine with aura 02/09/2010  . Herniated cervical disc 07/07/2007  . Lumbar disc herniation 07/07/2007  . Insomnia 06/10/2007  . Ascites 05/06/2007  . Esophageal reflux 04/01/2005  . Depression, major, recurrent, moderate ([place]) 12/02/1998  . Neuropathy, peripheral, autonomic, idiopathic 12/02/1998  . Hepatitis C 12/02/1988    Past Surgical History:  Procedure Laterality Date  . COLONOSCOPY WITH PROPOFOL N/A 05/12/2015   Procedure: COLONOSCOPY WITH PROPOFOL;  Surgeon: Hulen Luster, MD;  Location: [facility] ENDOSCOPY;  Service: Gastroenterology;  Laterality: N/A;  . HERNIA REPAIR  08/2012   Dr. Jamal Collin; [facility]    Prior to Admission medications   Medication Sig Start Date End Date Taking? Authorizing Provider  diphenoxylate-atropine (LOMOTIL) 2.5-0.025 MG per tablet Take 2 tablets by mouth every 6 (six) hours as needed for diarrhea or loose stools.  02/24/15  Yes Historical Provider, MD  escitalopram (LEXAPRO) 20 MG tablet Take 1 tablet (20 mg total) by mouth daily. 01/27/17  Yes Rainey Pines, MD  furosemide (LASIX) 20 MG tablet Take 20 mg by mouth daily.   Yes Historical Provider, MD  gabapentin (NEURONTIN) 300 MG capsule Take 1 capsule (300 mg total) by mouth 2 (two) times daily. 11/15/16 11/15/17 Yes Birdie Sons, MD  hyoscyamine (  ANASPAZ) 0.125 MG TBDP disintergrating tablet Place 0.125 mg under the tongue every 4 (four) hours as needed for bladder spasms or cramping.   Yes Historical Provider, MD  lactulose (CHRONULAC) 10 GM/15ML solution Take 30 mLs by mouth 3 (three) times daily. 03/19/15  Yes Historical Provider, MD  metoprolol tartrate (LOPRESSOR) 25 MG tablet Take 1 tablet (25 mg total) by mouth 2 (two) times daily. 06/20/16  Yes Birdie Sons, MD  metroNIDAZOLE (METROCREAM) 0.75 % cream Apply topically 2 (two) times daily. 01/10/17  Yes Birdie Sons, MD  nitroGLYCERIN (NITROSTAT) 0.4 MG SL tablet Place 0.4 mg under the tongue every 5 (five) minutes as needed for chest pain.   Yes Historical Provider, MD  nystatin cream (MYCOSTATIN) Apply 1 application topically 3 (three) times daily as needed (rash).    Yes Historical Provider, MD  omeprazole (PRILOSEC) 20 MG capsule TAKE 2 CAPSULES BY MOUTH ONCE DAILY. 10/03/16  Yes Birdie Sons, MD  ondansetron (ZOFRAN-ODT) 4 MG disintegrating tablet Take 4 mg by mouth every 8 (eight) hours as needed for nausea or vomiting.   Yes Historical Provider, MD  Oxycodone HCl 10 MG TABS Take 1 tablet (10 mg total) by mouth every 6 (six) hours as needed. No more than 4 tablets in one day Patient taking differently: Take 10 mg by mouth every 6 (six) hours as needed (pain). No more than 4 tablets in one day 02/07/17  Yes Birdie Sons, MD  prazosin (MINIPRESS) 5 MG capsule Take 1 capsule (5 mg total) by mouth at bedtime. 01/27/17  Yes Rainey Pines, MD  QUEtiapine (SEROQUEL) 200 MG tablet Take 1.5 tablets (300 mg total) by mouth at bedtime. 01/27/17  Yes Rainey Pines, MD  sildenafil (REVATIO) 20 MG tablet Take 2-3 tablets by mouth as needed (erectile dysfunction). No more than 3 in a day 04/04/15  Yes Historical Provider, MD  spironolactone (ALDACTONE) 50 MG tablet Take 1 tablet (50 mg total) by mouth daily. 03/27/16  Yes Birdie Sons, MD    Allergies Ambien [zolpidem tartrate]; Ibuprofen; Morphine; Morphine and related; Tylenol [acetaminophen]; Xanax [alprazolam]; and Zolpidem  Family History  Problem Relation Age of Onset  . Cancer Mother   . Cancer Father   . Alcohol abuse Brother   . Alcohol abuse Brother   . Drug abuse Brother     Social History Social History  Substance Use Topics  . Smoking status: Never Smoker  . Smokeless tobacco: Never Used  . Alcohol use No    Review of  Systems Constitutional: No fever/chills Eyes: No visual changes. ENT: No sore throat. Cardiovascular: Denies chest pain. Respiratory: Denies shortness of breath. Gastrointestinal: No abdominal pain.  No nausea, no vomiting.  No diarrhea.  No constipation. Genitourinary: Negative for dysuria. Musculoskeletal: Negative for back pain. Skin: Negative for rash. Neurological: Altered mental status  10-point ROS otherwise negative.  ____________________________________________   PHYSICAL EXAM:  VITAL SIGNS: ED Triage Vitals  Enc Vitals Group     BP --      Pulse --      Resp --      Temp 02/12/17 0014 98.7 F (37.1 C)     Temp Source 02/12/17 0014 Oral     SpO2 --      Weight 02/12/17 0019 242 lb (109.8 kg)     Height 02/12/17 0019 5\' 10"  (1.778 m)     Head Circumference --      Peak Flow --      Pain  Score --      Pain Loc --      Pain Edu? --      Excl. in [place]? --     Constitutional: Alert and oriented. Well appearing and in mild distress. Eyes: Conjunctivae are normal. PERRL. EOMI. Head: Atraumatic. Nose: No congestion/rhinnorhea. Mouth/Throat: Mucous membranes are moist.  Oropharynx non-erythematous. Cardiovascular: Normal rate, regular rhythm. Grossly normal heart sounds.  Good peripheral circulation. Respiratory: Normal respiratory effort.  No retractions. Lungs CTAB. Gastrointestinal: Soft and nontender. No distention. Positive bowel sounds Musculoskeletal: No lower extremity tenderness nor edema.   Neurologic:  Slurred speech speech difficult to understand language. Cranial nerves II through XII are grossly intact with no focal motor or neuro deficit Skin:  Skin is warm, dry and intact.  Psychiatric: Mood and affect are normal.   ____________________________________________   LABS (all labs ordered are listed, but only abnormal results are displayed)  Labs Reviewed  COMPREHENSIVE METABOLIC PANEL - Abnormal; Notable for the following:       Result Value    Glucose, Bld 106 (*)    Total Protein 8.5 (*)    All other components within normal limits  CBC - Abnormal; Notable for the following:    RBC 4.30 (*)    HCT 38.1 (*)    Platelets 118 (*)    All other components within normal limits  AMMONIA - Abnormal; Notable for the following:    Ammonia 40 (*)    All other components within normal limits  URINALYSIS, COMPLETE (UACMP) WITH MICROSCOPIC - Abnormal; Notable for the following:    Color, Urine STRAW (*)    APPearance CLEAR (*)    Specific Gravity, Urine 1.003 (*)    All other components within normal limits  URINE DRUG SCREEN, QUALITATIVE ([facility] ONLY) - Abnormal; Notable for the following:    Tricyclic, Ur Screen POSITIVE (*)    Opiate, Ur Screen POSITIVE (*)    All other components within normal limits  BLOOD GAS, VENOUS - Abnormal; Notable for the following:    pO2, Ven 47.0 (*)    Bicarbonate 28.9 (*)    Acid-Base Excess 2.7 (*)    All other components within normal limits   ____________________________________________  EKG  none ____________________________________________  RADIOLOGY  CT head ____________________________________________   PROCEDURES  Procedure(s) performed: None  Procedures  Critical Care performed: No  ____________________________________________   INITIAL IMPRESSION / ASSESSMENT AND PLAN / ED COURSE  Pertinent labs & imaging results that were available during my care of the patient were reviewed by me and considered in my medical decision making (see chart for details).  This is a 56 year old male who comes into the hospital today with some altered mental status. Per EMS they are concerned he may have taken his medication too much or too close together. The patient does manage his own medications according to family. The patient does have a mildly elevated ammonia which is 40. He has been admitted with ammonias at this level in the past. He does still have some slurred speech and does to have  some agitation. The patient's significant other reports that he is still a little out of it.  Clinical Course as of Feb 12 537  Wed Feb 12, 2017  0333 Negative CT HEAD. CT Head Wo Contrast [AW]    Clinical Course User Index [AW] Loney Hering, MD    Given the patient's confusion and his elevated ammonia level I will admit him to the hospitalist service. ____________________________________________  FINAL CLINICAL IMPRESSION(S) / ED DIAGNOSES  Final diagnoses:  Hepatic encephalopathy (HCC)  Increased ammonia level      NEW MEDICATIONS STARTED DURING THIS VISIT:  New Prescriptions   No medications on file     Note:  This document was prepared using Dragon voice recognition software and may include unintentional dictation errors.    Loney Hering, MD 02/12/17 [phone]

## 2017-02-12 NOTE — ED Notes (Signed)
Pt unable to be transported to floor right now, due to [place], [place] stating that floor has protected times until 68mins after.

## 2017-02-12 NOTE — ED Triage Notes (Signed)
Per EMS, pt has hx of cirrhosis and encephalopathy.  Pt was seen for AMS recently and it was found that pt took narcotic pain medicine too close to other medication and it sedated him.  Pt given 2mg  of narcan on scene and pt aroused.  Pt very figidity and agitated upon arrival.

## 2017-02-12 NOTE — Care Management Obs Status (Signed)
[facility] NOTIFICATION   Patient Details  Name: Ray Robles MRN: [id number] Date of Birth: [birth year]   Medicare Observation Status Notification Given:  Yes    Shelbie Ammons, RN 02/12/2017, 2:55 PM

## 2017-02-12 NOTE — H&P (Signed)
Ray Robles is an 56 y.o. male.   Chief Complaint: Altered mental status HPI: The patient with past medical history of cirrhosis of the liver secondary to hepatitis C presents to the emergency department because of altered mental status. Apparently the patient was initially very somnolent but eventually became agitated. His family found him this way when they came to see him this evening. The patient was reluctant to share details of what may be ailing him upon arrival. However, by the time of my interview the patient was more clear headed and stated that his back as well as hips hurt due to his herniated disks as well as arthritic hip pain. Laboratory evaluation revealed elevated ammonia. His family states that they realize his lactulose prescription is very out of date. Thus, due to medication noncompliance and waxing and waning mental status emergency department staff called the hospitalist service for admission.  Past Medical History:  Diagnosis Date  . Cirrhosis ([place])   . H/O bacterial pneumonia 11/11/2007   2008 and 2015   . Heart murmur   . Hepatitis C   . Hx of endoscopy    05/12/2014- [facility]. Dr Candace Cruise. Benign appearing stricture. Dilated the examination was otherwise normal.02/13/2012- Beniign appearing intrinsic mild stenosis at GE junction, successfully dialated  . Liver disease     Past Surgical History:  Procedure Laterality Date  . COLONOSCOPY WITH PROPOFOL N/A 05/12/2015   Procedure: COLONOSCOPY WITH PROPOFOL;  Surgeon: Hulen Luster, MD;  Location: [facility] ENDOSCOPY;  Service: Gastroenterology;  Laterality: N/A;  . HERNIA REPAIR  08/2012   Dr. Jamal Collin; [facility]    Family History  Problem Relation Age of Onset  . Cancer Mother   . Cancer Father   . Alcohol abuse Brother   . Alcohol abuse Brother   . Drug abuse Brother    Social History:  reports that he has never smoked. He has never used smokeless tobacco. He reports that he does not drink alcohol. His drug history is not on  file.  Allergies:  Allergies  Allergen Reactions  . Ambien [Zolpidem Tartrate] Other (See Comments)    Reaction: Violent sleep  . Ibuprofen Other (See Comments)    Can't take because of liver  . Morphine Diarrhea and Nausea And Vomiting  . Morphine And Related Diarrhea and Nausea And Vomiting  . Tylenol [Acetaminophen] Other (See Comments)    Can't take because of liver  . Xanax [Alprazolam] Other (See Comments)    Brings the ammonia levels up  . Zolpidem Other (See Comments)    Reaction: Violent Sleep    Prior to Admission medications   Medication Sig Start Date End Date Taking? Authorizing Provider  diphenoxylate-atropine (LOMOTIL) 2.5-0.025 MG per tablet Take 2 tablets by mouth every 6 (six) hours as needed for diarrhea or loose stools.  02/24/15  Yes Historical Provider, MD  escitalopram (LEXAPRO) 20 MG tablet Take 1 tablet (20 mg total) by mouth daily. 01/27/17  Yes Rainey Pines, MD  furosemide (LASIX) 20 MG tablet Take 20 mg by mouth daily.   Yes Historical Provider, MD  gabapentin (NEURONTIN) 300 MG capsule Take 1 capsule (300 mg total) by mouth 2 (two) times daily. 11/15/16 11/15/17 Yes Birdie Sons, MD  hyoscyamine (ANASPAZ) 0.125 MG TBDP disintergrating tablet Place 0.125 mg under the tongue every 4 (four) hours as needed for bladder spasms or cramping.   Yes Historical Provider, MD  lactulose (CHRONULAC) 10 GM/15ML solution Take 30 mLs by mouth 3 (three) times daily. 03/19/15  Yes Historical Provider, MD  metoprolol tartrate (LOPRESSOR) 25 MG tablet Take 1 tablet (25 mg total) by mouth 2 (two) times daily. 06/20/16  Yes Birdie Sons, MD  metroNIDAZOLE (METROCREAM) 0.75 % cream Apply topically 2 (two) times daily. 01/10/17  Yes Birdie Sons, MD  nitroGLYCERIN (NITROSTAT) 0.4 MG SL tablet Place 0.4 mg under the tongue every 5 (five) minutes as needed for chest pain.   Yes Historical Provider, MD  nystatin cream (MYCOSTATIN) Apply 1 application topically 3 (three) times daily  as needed (rash).    Yes Historical Provider, MD  omeprazole (PRILOSEC) 20 MG capsule TAKE 2 CAPSULES BY MOUTH ONCE DAILY. 10/03/16  Yes Birdie Sons, MD  ondansetron (ZOFRAN-ODT) 4 MG disintegrating tablet Take 4 mg by mouth every 8 (eight) hours as needed for nausea or vomiting.   Yes Historical Provider, MD  Oxycodone HCl 10 MG TABS Take 1 tablet (10 mg total) by mouth every 6 (six) hours as needed. No more than 4 tablets in one day Patient taking differently: Take 10 mg by mouth every 6 (six) hours as needed (pain). No more than 4 tablets in one day 02/07/17  Yes Birdie Sons, MD  prazosin (MINIPRESS) 5 MG capsule Take 1 capsule (5 mg total) by mouth at bedtime. 01/27/17  Yes Rainey Pines, MD  QUEtiapine (SEROQUEL) 200 MG tablet Take 1.5 tablets (300 mg total) by mouth at bedtime. 01/27/17  Yes Rainey Pines, MD  sildenafil (REVATIO) 20 MG tablet Take 2-3 tablets by mouth as needed (erectile dysfunction). No more than 3 in a day 04/04/15  Yes Historical Provider, MD  spironolactone (ALDACTONE) 50 MG tablet Take 1 tablet (50 mg total) by mouth daily. 03/27/16  Yes Birdie Sons, MD     Results for orders placed or performed during the hospital encounter of 02/12/17 (from the past 48 hour(s))  Comprehensive metabolic panel     Status: Abnormal   Collection Time: 02/12/17 12:25 AM  Result Value Ref Range   Sodium 137 135 - 145 mmol/L   Potassium 3.9 3.5 - 5.1 mmol/L   Chloride 104 101 - 111 mmol/L   CO2 26 22 - 32 mmol/L   Glucose, Bld 106 (H) 65 - 99 mg/dL   BUN 9 6 - 20 mg/dL   Creatinine, Ser 1.13 0.61 - 1.24 mg/dL   Calcium 9.0 8.9 - 10.3 mg/dL   Total Protein 8.5 (H) 6.5 - 8.1 g/dL   Albumin 4.6 3.5 - 5.0 g/dL   AST 41 15 - 41 U/L   ALT 36 17 - 63 U/L   Alkaline Phosphatase 125 38 - 126 U/L   Total Bilirubin 0.3 0.3 - 1.2 mg/dL   GFR calc non Af Amer >60 >60 mL/min   GFR calc Af Amer >60 >60 mL/min    Comment: (NOTE) The eGFR has been calculated using the CKD EPI equation. This  calculation has not been validated in all clinical situations. eGFR's persistently <60 mL/min signify possible Chronic Kidney Disease.    Anion gap 7 5 - 15  CBC     Status: Abnormal   Collection Time: 02/12/17 12:25 AM  Result Value Ref Range   WBC 4.4 3.8 - 10.6 K/uL   RBC 4.30 (L) 4.40 - 5.90 MIL/uL   Hemoglobin 13.0 13.0 - 18.0 g/dL   HCT 38.1 (L) 40.0 - 52.0 %   MCV 88.6 80.0 - 100.0 fL   MCH 30.3 26.0 - 34.0 pg   MCHC 34.2 32.0 -  36.0 g/dL   RDW 13.6 11.5 - 14.5 %   Platelets 118 (L) 150 - 440 K/uL  Ammonia     Status: Abnormal   Collection Time: 02/12/17 12:25 AM  Result Value Ref Range   Ammonia 40 (H) 9 - 35 umol/L  Urinalysis, Complete w Microscopic     Status: Abnormal   Collection Time: 02/12/17  2:01 AM  Result Value Ref Range   Color, Urine STRAW (A) YELLOW   APPearance CLEAR (A) CLEAR   Specific Gravity, Urine 1.003 (L) 1.005 - 1.030   pH 6.0 5.0 - 8.0   Glucose, UA NEGATIVE NEGATIVE mg/dL   Hgb urine dipstick NEGATIVE NEGATIVE   Bilirubin Urine NEGATIVE NEGATIVE   Ketones, ur NEGATIVE NEGATIVE mg/dL   Protein, ur NEGATIVE NEGATIVE mg/dL   Nitrite NEGATIVE NEGATIVE   Leukocytes, UA NEGATIVE NEGATIVE   RBC / HPF 0-5 0 - 5 RBC/hpf   WBC, UA NONE SEEN 0 - 5 WBC/hpf   Bacteria, UA NONE SEEN NONE SEEN   Squamous Epithelial / LPF NONE SEEN NONE SEEN  Urine Drug Screen, Qualitative ([facility] only)     Status: Abnormal   Collection Time: 02/12/17  2:01 AM  Result Value Ref Range   Tricyclic, Ur Screen POSITIVE (A) NONE DETECTED   Amphetamines, Ur Screen NONE DETECTED NONE DETECTED   MDMA (Ecstasy)Ur Screen NONE DETECTED NONE DETECTED   Cocaine Metabolite,Ur [place] NONE DETECTED NONE DETECTED   Opiate, Ur Screen POSITIVE (A) NONE DETECTED   Phencyclidine (PCP) Ur S NONE DETECTED NONE DETECTED   Cannabinoid 50 Ng, Ur [place] NONE DETECTED NONE DETECTED   Barbiturates, Ur Screen NONE DETECTED NONE DETECTED   Benzodiazepine, Ur Scrn NONE DETECTED NONE DETECTED   Methadone  Scn, Ur NONE DETECTED NONE DETECTED    Comment: (NOTE) 081  Tricyclics, urine               Cutoff 1000 ng/mL 200  Amphetamines, urine             Cutoff 1000 ng/mL 300  MDMA (Ecstasy), urine           Cutoff 500 ng/mL 400  Cocaine Metabolite, urine       Cutoff 300 ng/mL 500  Opiate, urine                   Cutoff 300 ng/mL 600  Phencyclidine (PCP), urine      Cutoff 25 ng/mL 700  Cannabinoid, urine              Cutoff 50 ng/mL 800  Barbiturates, urine             Cutoff 200 ng/mL 900  Benzodiazepine, urine           Cutoff 200 ng/mL 1000 Methadone, urine                Cutoff 300 ng/mL 1100 1200 The urine drug screen provides only a preliminary, unconfirmed 1300 analytical test result and should not be used for non-medical 1400 purposes. Clinical consideration and professional judgment should 1500 be applied to any positive drug screen result due to possible 1600 interfering substances. A more specific alternate chemical method 1700 must be used in order to obtain a confirmed analytical result.  1800 Gas chromato graphy / mass spectrometry (GC/MS) is the preferred 1900 confirmatory method.   Blood gas, venous     Status: Abnormal   Collection Time: 02/12/17  3:13 AM  Result Value Ref Range  pH, Ven 7.37 7.250 - 7.430   pCO2, Ven 50 44.0 - 60.0 mmHg   pO2, Ven 47.0 (H) 32.0 - 45.0 mmHg   Bicarbonate 28.9 (H) 20.0 - 28.0 mmol/L   Acid-Base Excess 2.7 (H) 0.0 - 2.0 mmol/L   O2 Saturation 81.2 %   Patient temperature 37.0    Collection site VENOUS    Sample type VENOUS    Ct Head Wo Contrast  Result Date: 02/12/2017 CLINICAL DATA:  Altered mental status, recent drug overdose. History of cirrhosis and encephalopathy. EXAM: CT HEAD WITHOUT CONTRAST TECHNIQUE: Contiguous axial images were obtained from the base of the skull through the vertex without intravenous contrast. COMPARISON:  CT HEAD January 12, 2017 FINDINGS: BRAIN: No intraparenchymal hemorrhage, mass effect nor midline  shift. The ventricles and sulci are normal. No acute large vascular territory infarcts. No abnormal extra-axial fluid collections. Basal cisterns are patent. VASCULAR: Trace calcific atherosclerosis SKULL/SOFT TISSUES: No skull fracture. No significant soft tissue swelling. ORBITS/SINUSES: The included ocular globes and orbital contents are normal.The mastoid aircells and included paranasal sinuses are well-aerated. OTHER: None. IMPRESSION: Negative CT HEAD. Electronically Signed   By: Elon Alas M.D.   On: 02/12/2017 02:14    Review of Systems  Constitutional: Negative for chills and fever.  HENT: Negative for sore throat and tinnitus.   Eyes: Negative for blurred vision and redness.  Respiratory: Negative for cough and shortness of breath.   Cardiovascular: Negative for chest pain, palpitations, orthopnea and PND.  Gastrointestinal: Negative for abdominal pain, diarrhea, nausea and vomiting.  Genitourinary: Negative for dysuria, frequency and urgency.  Musculoskeletal: Positive for back pain and joint pain. Negative for myalgias.  Skin: Negative for rash.       No lesions  Neurological: Negative for speech change, focal weakness and weakness.  Endo/Heme/Allergies: Does not bruise/bleed easily.       No temperature intolerance  Psychiatric/Behavioral: Negative for depression and suicidal ideas.    Blood pressure 138/76, pulse 68, temperature 98.7 F (37.1 C), temperature source Oral, resp. rate 13, height _0  (1.778 m), weight 109.8 kg (242 lb), SpO2 100 %. Physical Exam  Constitutional: He is oriented to person, place, and time. He appears well-developed and well-nourished. No distress.  HENT:  Head: Normocephalic and atraumatic.  Mouth/Throat: Oropharynx is clear and moist.  Eyes: Conjunctivae and EOM are normal. Pupils are equal, round, and reactive to light. No scleral icterus.  Neck: Normal range of motion. Neck supple. No JVD present. No tracheal deviation present. No  thyromegaly present.  Cardiovascular: Normal rate, regular rhythm and normal heart sounds.  Exam reveals no gallop and no friction rub.   No murmur heard. Respiratory: Effort normal and breath sounds normal. No respiratory distress.  GI: Soft. Bowel sounds are normal. He exhibits no distension. There is no tenderness.  Genitourinary:  Genitourinary Comments: Deferred  Musculoskeletal: Normal range of motion. He exhibits edema (trace).  Lymphadenopathy:    He has no cervical adenopathy.  Neurological: He is alert and oriented to person, place, and time. No cranial nerve deficit.  Skin: Skin is warm and dry. No rash noted. No erythema.  Psychiatric: He has a normal mood and affect. His behavior is normal. Judgment and thought content normal.     Assessment/Plan This is a 56 year old male admitted for somnolence. 1. Altered mental status: Somnolent; improved. The patient also is no longer agitated. Most likely etiology is elevated ammonia level secondary to cirrhosis area check for reversible causes of altered mental status  as well. Continue Seroquel 2. Cirrhosis: I have started the patient's lactulose at a higher dose. Continue to monitor mental status. Check coags. Bilirubin is within normal limits. Minimal ascites. Continue Lasix and spironolactone 3. Hypertension: Controlled; continue metoprolol  4. Bladder dysfunction: Continue Minipress and hyoscyamine 5. Depression: Continue Lexapro 6. DVT prophylaxis: Lovenox 7. GI prophylaxis: None The patient is a full code. Time spent on admission was inpatient care possibly 45 minutes  Harrie Foreman, MD 02/12/2017, 6:51 AM

## 2017-02-13 ENCOUNTER — Other Ambulatory Visit: Payer: Self-pay | Admitting: Family Medicine

## 2017-02-13 DIAGNOSIS — K219 Gastro-esophageal reflux disease without esophagitis: Secondary | ICD-10-CM

## 2017-02-24 ENCOUNTER — Ambulatory Visit: Payer: Medicare Other | Admitting: Psychiatry

## 2017-02-26 ENCOUNTER — Telehealth: Payer: Self-pay | Admitting: Family Medicine

## 2017-02-26 MED ORDER — LACTULOSE 10 GM/15ML PO SOLN: 20.0000 g | Freq: Three times a day (TID) | ORAL | 5 refills | Status: DC

## 2017-02-26 NOTE — Telephone Encounter (Signed)
Pt needs a prescription for  Lactulose 10zm-57ml. Take 30 ml by mouth TID.   He took this for a long time while seeing Dr. Jenetta Downer at [facility].  Since Dr. Jenetta Downer has left he has no one to precsribe this for him  He uses [facility]  Thank sTeri

## 2017-02-26 NOTE — Telephone Encounter (Signed)
Please advise 

## 2017-03-01 NOTE — ED Provider Notes (Signed)
ED ECG REPORT I, Loney Hering, the attending physician, personally viewed and interpreted this ECG.   Date: 02/12/2017  EKG Time: 0035  Rate: 107  Rhythm: sinus tachycardia  Axis: normal  Intervals:none  ST&T Change: none    Loney Hering, MD 03/01/17 [phone]

## 2017-03-19 ENCOUNTER — Other Ambulatory Visit: Payer: Self-pay | Admitting: Psychiatry

## 2017-03-19 DIAGNOSIS — F331 Major depressive disorder, recurrent, moderate: Secondary | ICD-10-CM

## 2017-03-31 ENCOUNTER — Ambulatory Visit: Payer: Medicare Other | Admitting: Psychiatry

## 2017-04-08 ENCOUNTER — Other Ambulatory Visit: Payer: Self-pay | Admitting: Family Medicine

## 2017-04-08 DIAGNOSIS — I10 Essential (primary) hypertension: Secondary | ICD-10-CM

## 2017-05-07 ENCOUNTER — Ambulatory Visit: Payer: Medicare Other | Admitting: Psychiatry

## 2017-05-16 ENCOUNTER — Telehealth: Payer: Self-pay | Admitting: Family Medicine

## 2017-05-16 ENCOUNTER — Encounter: Payer: Self-pay | Admitting: Family Medicine

## 2017-05-16 ENCOUNTER — Ambulatory Visit (INDEPENDENT_AMBULATORY_CARE_PROVIDER_SITE_OTHER): Payer: Medicare Other | Admitting: Family Medicine

## 2017-05-16 VITALS — BP 120/80 | HR 79 | Temp 97.8°F | Resp 18 | Wt 252.0 lb

## 2017-05-16 DIAGNOSIS — R601 Generalized edema: Secondary | ICD-10-CM

## 2017-05-16 DIAGNOSIS — R188 Other ascites: Secondary | ICD-10-CM | POA: Diagnosis not present

## 2017-05-16 DIAGNOSIS — K7031 Alcoholic cirrhosis of liver with ascites: Secondary | ICD-10-CM | POA: Diagnosis not present

## 2017-05-16 MED ORDER — FUROSEMIDE 40 MG PO TABS: 40.0000 mg | ORAL_TABLET | Freq: Every day | ORAL | 1 refills | Status: DC

## 2017-05-16 MED ORDER — FUROSEMIDE 20 MG PO TABS: 20.0000 mg | ORAL_TABLET | Freq: Every day | ORAL | 0 refills | Status: DC

## 2017-05-16 NOTE — Progress Notes (Signed)
Patient: Ray Robles Male    DOB: 03-13-1961   56 y.o.   MRN: [id number] Visit Date: 05/16/2017  Today's Provider: Lelon Huh, MD   Chief Complaint  Patient presents with  . Edema   Subjective:    HPI Edema:  Patient comes in today reporting having swelling in his feet and legs for more than 2 weeks. He states his abdomen has been swelling in his abdomen for 2 months Patient states it burns when he touches his leg. Patient is currently taking Lasix and Sprionolactone. Next follow up at [facility] liver clinic is August. Has been a little more short of breath. Has had no recent change in diet or medications. He states he is taking furosemide and spironolactone twice a day, but he is not sure of the dosages. Review of refill history shows he has had both 20 and 40mg  furosemide's and both 50 and 100mg  spironolactone filled this year.     Allergies  Allergen Reactions  . Ambien [Zolpidem Tartrate] Other (See Comments)    Reaction: Violent sleep  . Ibuprofen Other (See Comments)    Can't take because of liver  . Morphine Diarrhea and Nausea And Vomiting  . Morphine And Related Diarrhea and Nausea And Vomiting  . Tylenol [Acetaminophen] Other (See Comments)    Can't take because of liver  . Xanax [Alprazolam] Other (See Comments)    Brings the ammonia levels up  . Zolpidem Other (See Comments)    Reaction: Violent Sleep     Current Outpatient Prescriptions:  .  buprenorphine-naloxone (SUBOXONE) 2-0.5 mg SUBL SL tablet, Take 2 tabs qam, 1 tab qpm, and 2 tabs qhs, Disp: , Rfl:  .  diphenoxylate-atropine (LOMOTIL) 2.5-0.025 MG per tablet, Take 2 tablets by mouth every 6 (six) hours as needed for diarrhea or loose stools. , Disp: , Rfl:  .  escitalopram (LEXAPRO) 20 MG tablet, Take 1 tablet (20 mg total) by mouth daily., Disp: 30 tablet, Rfl: 3 .  furosemide (LASIX) 20 or 40MG  tablet, Take 1 tablet (20 mg total) by mouth daily., Disp: 30 tablet, Rfl: 0 .  gabapentin (NEURONTIN)  300 MG capsule, Take 1 capsule (300 mg total) by mouth 2 (two) times daily., Disp: 30 capsule, Rfl: 3 .  hyoscyamine (ANASPAZ) 0.125 MG TBDP disintergrating tablet, Place 0.125 mg under the tongue every 4 (four) hours as needed for bladder spasms or cramping., Disp: , Rfl:  .  lactulose (CHRONULAC) 10 GM/15ML solution, Take 30 mLs (20 g total) by mouth 3 (three) times daily., Disp: 240 mL, Rfl: 5 .  metoprolol tartrate (LOPRESSOR) 25 MG tablet, TAKE 1 TABLET BY MOUTH TWICE DAILY, Disp: 30 tablet, Rfl: 12 .  metroNIDAZOLE (METROCREAM) 0.75 % cream, Apply topically 2 (two) times daily., Disp: 45 g, Rfl: 0 .  nitroGLYCERIN (NITROSTAT) 0.4 MG SL tablet, Place 0.4 mg under the tongue every 5 (five) minutes as needed for chest pain., Disp: , Rfl:  .  nystatin cream (MYCOSTATIN), Apply 1 application topically 3 (three) times daily as needed (rash). , Disp: , Rfl:  .  omeprazole (PRILOSEC) 20 MG capsule, TAKE 2 CAPSULES BY MOUTH ONCE DAILY, Disp: 60 capsule, Rfl: 12 .  ondansetron (ZOFRAN-ODT) 4 MG disintegrating tablet, Take 4 mg by mouth every 8 (eight) hours as needed for nausea or vomiting., Disp: , Rfl:  .  prazosin (MINIPRESS) 5 MG capsule, Take 1 capsule (5 mg total) by mouth at bedtime., Disp: 30 capsule, Rfl: 3 .  QUEtiapine (SEROQUEL) 200 MG tablet, Take 1.5 tablets (300 mg total) by mouth at bedtime., Disp: 45 tablet, Rfl: 3 .  sildenafil (REVATIO) 20 MG tablet, Take 2-3 tablets by mouth as needed (erectile dysfunction). No more than 3 in a day, Disp: , Rfl:  .  spironolactone (ALDACTONE) 50 or 100 MG tablet, Take 1 tablet (50 mg total) by mouth daily., Disp: 30 tablet, Rfl: 0 .  Oxycodone HCl 10 MG TABS, Take 1 tablet (10 mg total) by mouth every 6 (six) hours as needed. No more than 4 tablets in one day (Patient not taking: Reported on 05/16/2017), Disp: 120 tablet, Rfl: 0  Review of Systems  Constitutional: Positive for diaphoresis. Negative for appetite change, chills and fever.  Respiratory:  Positive for shortness of breath. Negative for chest tightness and wheezing.   Cardiovascular: Positive for leg swelling. Negative for chest pain and palpitations.  Gastrointestinal: Positive for abdominal distention and abdominal pain. Negative for nausea and vomiting.  Musculoskeletal: Positive for back pain and myalgias (leg pain).  Skin: Positive for color change (redness in legs).    Social History  Substance Use Topics  . Smoking status: Never Smoker  . Smokeless tobacco: Never Used  . Alcohol use No   Objective:   BP 120/80 (BP Location: Left Arm, Patient Position: Sitting, Cuff Size: Large)   Pulse 79   Temp 97.8 F (36.6 C) (Oral)   Resp 18   Wt 252 lb (114.3 kg)   SpO2 96% Comment: room air  BMI 35.65 kg/m  Vitals:   05/16/17 1058  BP: 120/80  Pulse: 79  Resp: 18  Temp: 97.8 F (36.6 C)  TempSrc: Oral  SpO2: 96%  Weight: 252 lb (114.3 kg)     Physical Exam  General Appearance:    Alert, cooperative, no distress  Eyes:    PERRL, conjunctiva/corneas clear, EOM's intact       Lungs:     Clear to auscultation bilaterally, respirations unlabored  Heart:    Regular rate and rhythm  Abdomen:   Moderately distended abdomen, non tender, no discrete masses. 3+ edema both feet and lower legs.         Assessment & Plan:     1. Other ascites  - Comprehensive metabolic panel - CBC - T4 AND TSH  2. Generalized edema  - Comprehensive metabolic panel - CBC - T4 AND TSH  Patient does not know which dosage of furosemide and spironolactone he is taking. He is to call back this afternoon so we can make appropriate changes.       Lelon Huh, MD  [facility]

## 2017-05-16 NOTE — Telephone Encounter (Signed)
Dr. [name] asked the patient to call back with the medication and dose when he got home.  He is taking Furosemide 20mg  two tablets daily and spironolactone 50mg  two tablets daily.

## 2017-05-16 NOTE — Telephone Encounter (Signed)
Left vm notifying Cathy new rx was sent to pharmacy.

## 2017-05-16 NOTE — Telephone Encounter (Signed)
Have sent new prescription for higher dose (40mg ) of furosemide twice a day

## 2017-05-16 NOTE — Patient Instructions (Signed)
   Please call back with current dosages of spironolactone and furosemide that you are taking

## 2017-05-17 LAB — CBC
HEMATOCRIT: 38.1 % (ref 37.5–51.0)
HEMOGLOBIN: 12.6 g/dL — AB (ref 13.0–17.7)
MCH: 29.6 pg (ref 26.6–33.0)
MCHC: 33.1 g/dL (ref 31.5–35.7)
MCV: 89 fL (ref 79–97)
PLATELETS: 151 10*3/uL (ref 150–379)
RBC: 4.26 x10E6/uL (ref 4.14–5.80)
RDW: 14.3 % (ref 12.3–15.4)
WBC: 4.4 10*3/uL (ref 3.4–10.8)

## 2017-05-17 LAB — COMPREHENSIVE METABOLIC PANEL
A/G RATIO: 1.4 (ref 1.2–2.2)
ALK PHOS: 125 IU/L — AB (ref 39–117)
ALT: 24 IU/L (ref 0–44)
AST: 23 IU/L (ref 0–40)
Albumin: 4.4 g/dL (ref 3.5–5.5)
BILIRUBIN TOTAL: 0.4 mg/dL (ref 0.0–1.2)
BUN/Creatinine Ratio: 13 (ref 9–20)
BUN: 13 mg/dL (ref 6–24)
CHLORIDE: 98 mmol/L (ref 96–106)
CO2: 24 mmol/L (ref 20–29)
Calcium: 9.5 mg/dL (ref 8.7–10.2)
Creatinine, Ser: 0.99 mg/dL (ref 0.76–1.27)
GFR calc Af Amer: 97 mL/min/{1.73_m2} (ref >59)
GFR calc non Af Amer: 84 mL/min/{1.73_m2} (ref >59)
GLUCOSE: 130 mg/dL — AB (ref 65–99)
Globulin, Total: 3.2 g/dL (ref 1.5–4.5)
POTASSIUM: 4.6 mmol/L (ref 3.5–5.2)
Sodium: 138 mmol/L (ref 134–144)
Total Protein: 7.6 g/dL (ref 6.0–8.5)

## 2017-05-17 LAB — T4 AND TSH
T4, Total: 4.7 ug/dL (ref 4.5–12.0)
TSH: 12.17 u[IU]/mL — ABNORMAL HIGH (ref 0.450–4.500)

## 2017-05-19 ENCOUNTER — Telehealth: Payer: Self-pay | Admitting: *Deleted

## 2017-05-19 MED ORDER — FUROSEMIDE 40 MG PO TABS: 40.0000 mg | ORAL_TABLET | Freq: Every day | ORAL | 1 refills | Status: DC

## 2017-05-19 NOTE — Telephone Encounter (Signed)
-----   Message from Birdie Sons, MD sent at 05/17/2017  8:37 AM EDT ----- Is very hypothyroid which can aggravate fluid retention. Start levothyroxine 50mcg daily #30 rf x 3. Continue furosemide 40mg  twice a day and spironolactone 50mg  twice a day. Follow up in 2 weeks.

## 2017-05-22 ENCOUNTER — Telehealth: Payer: Self-pay | Admitting: Family Medicine

## 2017-05-23 ENCOUNTER — Telehealth: Payer: Self-pay

## 2017-05-23 DIAGNOSIS — E039 Hypothyroidism, unspecified: Secondary | ICD-10-CM

## 2017-05-23 MED ORDER — LEVOTHYROXINE SODIUM 75 MCG PO TABS: 75.0000 ug | ORAL_TABLET | Freq: Every day | ORAL | 3 refills | Status: DC

## 2017-05-23 NOTE — Telephone Encounter (Signed)
Patient and his wife Ray Robles advised and verbally voiced understanding. Prescription for Levothyroxine sent into the pharmacy. 2 week follow up scheduled for 06/06/2017 at 11:15am.

## 2017-05-23 NOTE — Telephone Encounter (Signed)
-----   Message from Birdie Sons, MD sent at 05/17/2017  8:37 AM EDT ----- Is very hypothyroid which can aggravate fluid retention. Start levothyroxine 43mcg daily #30 rf x 3. Continue furosemide 40mg  twice a day and spironolactone 50mg  twice a day. Follow up in 2 weeks.

## 2017-05-26 ENCOUNTER — Ambulatory Visit (INDEPENDENT_AMBULATORY_CARE_PROVIDER_SITE_OTHER): Payer: Medicare Other | Admitting: Psychiatry

## 2017-05-26 ENCOUNTER — Encounter: Payer: Self-pay | Admitting: Psychiatry

## 2017-05-26 VITALS — BP 131/86 | HR 72 | Temp 97.5°F | Wt 254.4 lb

## 2017-05-26 DIAGNOSIS — F5101 Primary insomnia: Secondary | ICD-10-CM | POA: Diagnosis not present

## 2017-05-26 DIAGNOSIS — F331 Major depressive disorder, recurrent, moderate: Secondary | ICD-10-CM | POA: Diagnosis not present

## 2017-05-26 MED ORDER — ESCITALOPRAM OXALATE 20 MG PO TABS: 20.0000 mg | ORAL_TABLET | Freq: Every day | ORAL | 3 refills | Status: DC

## 2017-05-26 MED ORDER — QUETIAPINE FUMARATE 200 MG PO TABS: 300.0000 mg | ORAL_TABLET | Freq: Every day | ORAL | 3 refills | Status: DC

## 2017-05-26 MED ORDER — PRAZOSIN HCL 5 MG PO CAPS: 5.0000 mg | ORAL_CAPSULE | Freq: Every day | ORAL | 3 refills | Status: DC
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
## 2017-06-03 ENCOUNTER — Other Ambulatory Visit: Payer: Self-pay

## 2017-06-06 ENCOUNTER — Encounter: Payer: Self-pay | Admitting: Family Medicine

## 2017-06-06 ENCOUNTER — Ambulatory Visit (INDEPENDENT_AMBULATORY_CARE_PROVIDER_SITE_OTHER): Payer: Medicare Other | Admitting: Family Medicine

## 2017-06-06 VITALS — BP 122/76 | HR 85 | Temp 98.1°F | Resp 18 | Wt 258.0 lb

## 2017-06-06 DIAGNOSIS — K7460 Unspecified cirrhosis of liver: Secondary | ICD-10-CM | POA: Diagnosis not present

## 2017-06-06 DIAGNOSIS — R601 Generalized edema: Secondary | ICD-10-CM

## 2017-06-06 DIAGNOSIS — E039 Hypothyroidism, unspecified: Secondary | ICD-10-CM

## 2017-06-06 MED ORDER — ZALEPLON 10 MG PO CAPS: 10.0000 mg | ORAL_CAPSULE | Freq: Every evening | ORAL | 5 refills | Status: DC | PRN

## 2017-06-06 MED ORDER — FUROSEMIDE 40 MG PO TABS: 40.0000 mg | ORAL_TABLET | Freq: Every day | ORAL | 1 refills | Status: DC

## 2017-06-06 NOTE — Progress Notes (Signed)
Patient: Ray Robles Male    DOB: 11/08/1961   56 y.o.   MRN: [id number] Visit Date: 06/06/2017  Today's Provider: Lelon Huh, MD   Chief Complaint  Patient presents with  . Follow-up   Subjective:    HPI Follow up of Hypothyroidism:  Patient was last seen 2 weeks ago. During that visit, patient was experiencing symptoms of generalized edema. Labs were ordered showing that patient was hypothyroid. Patient was started on Levothyroxine 54mcg daily. Patient reports good compliance with treatment.  Lab Results  Component Value Date   TSH 12.170 (H) 05/16/2017     Follow up of swelling:  Patient was last seen for this problem 2 weeks ago.He was found to be mildly hyPO thyroid as above and was started on Levothyroxine and advised to increase Furosemide to 40mg  twice daily and to continue Spironolactone 50mg  twice a day. Today patient comes in reporting that the swelling has worsened. Patient has only been taking Furosemide 40mg  once a day instead of twice daily. Patient also has redness and burning pain in his feet and legs. Patient has gained 6 pounds since the last office visit. He denies any changes in diet. He is scheduled to see Dr. Drue Robles 07/11/2017  Wt Readings from Last 3 Encounters:  06/06/17 258 lb (117 kg)  05/26/17 254 lb 6.4 oz (115.4 kg)  05/16/17 252 lb (114.3 kg)       Allergies  Allergen Reactions  . Ambien [Zolpidem Tartrate] Other (See Comments)    Reaction: Violent sleep  . Ibuprofen Other (See Comments)    Can't take because of liver  . Morphine Diarrhea and Nausea And Vomiting  . Morphine And Related Diarrhea and Nausea And Vomiting  . Tylenol [Acetaminophen] Other (See Comments)    Can't take because of liver  . Xanax [Alprazolam] Other (See Comments)    Brings the ammonia levels up  . Zolpidem Other (See Comments)    Reaction: Violent Sleep     Current Outpatient Prescriptions:  .  buprenorphine-naloxone (SUBOXONE) 2-0.5 mg SUBL SL  tablet, Take 2 tabs qam, 1 tab qpm, and 2 tabs qhs, Disp: , Rfl:  .  diphenoxylate-atropine (LOMOTIL) 2.5-0.025 MG per tablet, Take 2 tablets by mouth every 6 (six) hours as needed for diarrhea or loose stools. , Disp: , Rfl:  .  escitalopram (LEXAPRO) 20 MG tablet, Take 1 tablet (20 mg total) by mouth daily., Disp: 30 tablet, Rfl: 3 .  furosemide (LASIX) 40 MG tablet, Take 1 tablet (40 mg total) by mouth daily., Disp: 60 tablet, Rfl: 1 .  gabapentin (NEURONTIN) 300 MG capsule, Take 1 capsule (300 mg total) by mouth 2 (two) times daily., Disp: 30 capsule, Rfl: 3 .  hyoscyamine (ANASPAZ) 0.125 MG TBDP disintergrating tablet, Place 0.125 mg under the tongue every 4 (four) hours as needed for bladder spasms or cramping., Disp: , Rfl:  .  lactulose (CHRONULAC) 10 GM/15ML solution, Take 30 mLs (20 g total) by mouth 3 (three) times daily., Disp: 240 mL, Rfl: 5 .  levothyroxine (SYNTHROID, LEVOTHROID) 75 MCG tablet, Take 1 tablet (75 mcg total) by mouth daily., Disp: 30 tablet, Rfl: 3 .  metoprolol tartrate (LOPRESSOR) 25 MG tablet, TAKE 1 TABLET BY MOUTH TWICE DAILY, Disp: 30 tablet, Rfl: 12 .  metroNIDAZOLE (METROCREAM) 0.75 % cream, Apply topically 2 (two) times daily., Disp: 45 g, Rfl: 0 .  nitroGLYCERIN (NITROSTAT) 0.4 MG SL tablet, Place 0.4 mg under the tongue every 5 (five)  minutes as needed for chest pain., Disp: , Rfl:  .  nystatin cream (MYCOSTATIN), Apply 1 application topically 3 (three) times daily as needed (rash). , Disp: , Rfl:  .  omeprazole (PRILOSEC) 20 MG capsule, TAKE 2 CAPSULES BY MOUTH ONCE DAILY, Disp: 60 capsule, Rfl: 12 .  ondansetron (ZOFRAN-ODT) 4 MG disintegrating tablet, Take 4 mg by mouth every 8 (eight) hours as needed for nausea or vomiting., Disp: , Rfl:  .  Oxycodone HCl 10 MG TABS, Take 1 tablet (10 mg total) by mouth every 6 (six) hours as needed. No more than 4 tablets in one day, Disp: 120 tablet, Rfl: 0 .  prazosin (MINIPRESS) 5 MG capsule, Take 1 capsule (5 mg total)  by mouth at bedtime., Disp: 30 capsule, Rfl: 3 .  QUEtiapine (SEROQUEL) 200 MG tablet, Take 1.5 tablets (300 mg total) by mouth at bedtime., Disp: 45 tablet, Rfl: 3 .  sildenafil (REVATIO) 20 MG tablet, Take 2-3 tablets by mouth as needed (erectile dysfunction). No more than 3 in a day, Disp: , Rfl:  .  spironolactone (ALDACTONE) 50 MG tablet, Take 1 tablet (50 mg total) by mouth daily., Disp: 30 tablet, Rfl: 0 .  zaleplon (SONATA) 10 MG capsule, Take 1 capsule by mouth at bedtime as needed., Disp: , Rfl:   Review of Systems  Constitutional: Positive for fatigue. Negative for appetite change, chills, diaphoresis and fever.  Respiratory: Positive for shortness of breath. Negative for cough, chest tightness and wheezing.   Cardiovascular: Positive for leg swelling. Negative for chest pain and palpitations.  Gastrointestinal: Negative for abdominal pain, nausea and vomiting.  Musculoskeletal: Positive for myalgias (in legs).  Skin: Positive for color change (redness in feet and legs).  Neurological: Negative for dizziness, light-headedness and headaches.    Social History  Substance Use Topics  . Smoking status: Never Smoker  . Smokeless tobacco: Never Used  . Alcohol use No   Objective:   BP 122/76 (BP Location: Left Arm, Patient Position: Sitting, Cuff Size: Large)   Pulse 85   Temp 98.1 F (36.7 C) (Oral)   Resp 18   Wt 258 lb (117 kg)   SpO2 97% Comment: room air  BMI 36.50 kg/m  There were no vitals filed for this visit.   Physical Exam  General Appearance:    Alert, cooperative, no distress  Eyes:    PERRL, conjunctiva/corneas clear, EOM's intact       Lungs:     Clear to auscultation bilaterally, respirations unlabored  Heart:    Regular rate and rhythm  Abdomen:   Moderately distended abdomen, non tender, no discrete masses. 3+ edema both feet and lower legs.        Assessment & Plan:     1. Hypothyroidism, unspecified type Is tolerating initiation of  levothyroxine well.   2. Cirrhosis, non-alcoholic ([place]) Follow up Dr. Drue Robles in August as scheduled.   3. Generalized edema Worsening, however he is still on taking spironolactone once a day. Hypothyroidism may be contributing factor, although this is primarily due to underlying liver disease. He is to increase furosemide to 40 BID.Follow up in 2 weeks to check on swelling and thyroid functions.        Ray Huh, MD  [facility]

## 2017-06-20 ENCOUNTER — Encounter: Payer: Self-pay | Admitting: Family Medicine

## 2017-06-20 ENCOUNTER — Other Ambulatory Visit: Payer: Self-pay | Admitting: Psychiatry

## 2017-06-20 ENCOUNTER — Ambulatory Visit (INDEPENDENT_AMBULATORY_CARE_PROVIDER_SITE_OTHER): Payer: Medicare Other | Admitting: Family Medicine

## 2017-06-20 VITALS — BP 110/70 | HR 85 | Temp 98.0°F | Resp 16 | Ht 71.0 in | Wt 260.2 lb

## 2017-06-20 DIAGNOSIS — I10 Essential (primary) hypertension: Secondary | ICD-10-CM | POA: Diagnosis not present

## 2017-06-20 DIAGNOSIS — R609 Edema, unspecified: Secondary | ICD-10-CM | POA: Insufficient documentation

## 2017-06-20 DIAGNOSIS — E039 Hypothyroidism, unspecified: Secondary | ICD-10-CM | POA: Diagnosis not present

## 2017-06-20 DIAGNOSIS — R188 Other ascites: Secondary | ICD-10-CM | POA: Diagnosis not present

## 2017-06-20 DIAGNOSIS — K7460 Unspecified cirrhosis of liver: Secondary | ICD-10-CM | POA: Diagnosis not present

## 2017-06-20 MED ORDER — FUROSEMIDE 40 MG PO TABS: 40.0000 mg | ORAL_TABLET | Freq: Two times a day (BID) | ORAL | 1 refills | Status: DC

## 2017-06-20 NOTE — Patient Instructions (Signed)
   Increase furosemide to 40mg  TWICE daily until further notice

## 2017-06-20 NOTE — Progress Notes (Signed)
Patient: Ray Robles Male    DOB: [birth year]   56 y.o.   MRN: [id number] Visit Date: 06/20/2017  Today's Provider: Lelon Huh, MD   Chief Complaint  Patient presents with  . Follow-up  . Edema  . Hypothyroidism   Subjective:    HPI  Hypothyroidism, unspecified type From 06/06/2017-no changes.  Generalized edema From 06/06/2017-Worsening. Increased furosemide to 40 BID. Follow up in 2 weeks to check on swelling and thyroid functions.  Wt Readings from Last 3 Encounters:  06/20/17 260 lb 3.2 oz (118 kg)  06/06/17 258 lb (117 kg)  05/16/17 252 lb (114.3 kg)    Patient stated that furosemide 40 mg bid and spironolactone 50 mg bid are not helping with swelling. Patient is still having swelling in legs and feet.   Allergies  Allergen Reactions  . Ambien [Zolpidem Tartrate] Other (See Comments)    Reaction: Violent sleep  . Ibuprofen Other (See Comments)    Can't take because of liver  . Morphine Diarrhea and Nausea And Vomiting  . Morphine And Related Diarrhea and Nausea And Vomiting  . Tylenol [Acetaminophen] Other (See Comments)    Can't take because of liver  . Xanax [Alprazolam] Other (See Comments)    Brings the ammonia levels up  . Zolpidem Other (See Comments)    Reaction: Violent Sleep     Current Outpatient Prescriptions:  .  buprenorphine-naloxone (SUBOXONE) 2-0.5 mg SUBL SL tablet, Take 2 tabs qam, 1 tab qpm, and 2 tabs qhs, Disp: , Rfl:  .  diphenoxylate-atropine (LOMOTIL) 2.5-0.025 MG per tablet, Take 2 tablets by mouth every 6 (six) hours as needed for diarrhea or loose stools. , Disp: , Rfl:  .  escitalopram (LEXAPRO) 20 MG tablet, Take 1 tablet (20 mg total) by mouth daily., Disp: 30 tablet, Rfl: 3 .  furosemide (LASIX) 40 MG tablet, Take 1 tablet (40 mg total) by mouth daily.  Disp: 60 tablet, Rfl: 1 .  gabapentin (NEURONTIN) 300 MG capsule, Take 1 capsule (300 mg total) by mouth 2 (two) times daily., Disp: 30 capsule, Rfl: 3 .  hyoscyamine  (ANASPAZ) 0.125 MG TBDP disintergrating tablet, Place 0.125 mg under the tongue every 4 (four) hours as needed for bladder spasms or cramping., Disp: , Rfl:  .  lactulose (CHRONULAC) 10 GM/15ML solution, Take 30 mLs (20 g total) by mouth 3 (three) times daily., Disp: 240 mL, Rfl: 5 .  levothyroxine (SYNTHROID, LEVOTHROID) 75 MCG tablet, Take 1 tablet (75 mcg total) by mouth daily., Disp: 30 tablet, Rfl: 3 .  metoprolol tartrate (LOPRESSOR) 25 MG tablet, TAKE 1 TABLET BY MOUTH TWICE DAILY, Disp: 30 tablet, Rfl: 12 .  metroNIDAZOLE (METROCREAM) 0.75 % cream, Apply topically 2 (two) times daily., Disp: 45 g, Rfl: 0 .  nitroGLYCERIN (NITROSTAT) 0.4 MG SL tablet, Place 0.4 mg under the tongue every 5 (five) minutes as needed for chest pain., Disp: , Rfl:  .  nystatin cream (MYCOSTATIN), Apply 1 application topically 3 (three) times daily as needed (rash). , Disp: , Rfl:  .  omeprazole (PRILOSEC) 20 MG capsule, TAKE 2 CAPSULES BY MOUTH ONCE DAILY, Disp: 60 capsule, Rfl: 12 .  ondansetron (ZOFRAN-ODT) 4 MG disintegrating tablet, Take 4 mg by mouth every 8 (eight) hours as needed for nausea or vomiting., Disp: , Rfl:  .  Oxycodone HCl 10 MG TABS, Take 1 tablet (10 mg total) by mouth every 6 (six) hours as needed. No more than 4 tablets in one  day, Disp: 120 tablet, Rfl: 0 .  prazosin (MINIPRESS) 5 MG capsule, Take 1 capsule (5 mg total) by mouth at bedtime., Disp: 30 capsule, Rfl: 3 .  QUEtiapine (SEROQUEL) 200 MG tablet, Take 1.5 tablets (300 mg total) by mouth at bedtime., Disp: 45 tablet, Rfl: 3 .  sildenafil (REVATIO) 20 MG tablet, Take 2-3 tablets by mouth as needed (erectile dysfunction). No more than 3 in a day, Disp: , Rfl:  .  spironolactone (ALDACTONE) 50 MG tablet, Take 1 tablet (50 mg total) by mouth daily. (Patient taking differently: Take 50 mg by mouth 2 (two) times daily. ), Disp: 30 tablet, Rfl: 0 .  zaleplon (SONATA) 10 MG capsule, Take 1 capsule (10 mg total) by mouth at bedtime as needed.,  Disp: 30 capsule, Rfl: 5  Review of Systems  Constitutional: Negative for appetite change, chills and fever.  Respiratory: Negative for chest tightness, shortness of breath and wheezing.   Cardiovascular: Positive for leg swelling. Negative for chest pain and palpitations.  Gastrointestinal: Negative for abdominal pain, nausea and vomiting.    Social History  Substance Use Topics  . Smoking status: Never Smoker  . Smokeless tobacco: Never Used  . Alcohol use No   Objective:   BP 110/70 (BP Location: Right Arm, Patient Position: Sitting, Cuff Size: Large)   Pulse 85   Temp 98 F (36.7 C) (Oral)   Resp 16   Ht 5\' 11"  (1.803 m)   Wt 260 lb 3.2 oz (118 kg)   SpO2 96%   BMI 36.29 kg/m  Vitals:   06/20/17 1514  BP: 110/70  Pulse: 85  Resp: 16  Temp: 98 F (36.7 C)  TempSrc: Oral  SpO2: 96%  Weight: 260 lb 3.2 oz (118 kg)  Height: 5\' 11"  (1.803 m)     Physical Exam   Physical Exam  General Appearance:    Alert, cooperative, no distress  Eyes:    PERRL, conjunctiva/corneas clear, EOM's intact       Lungs:     Clear to auscultation bilaterally, respirations unlabored  Heart:    Regular rate and rhythm  Abdomen:   Moderately distended abdomen, non tender, no discrete masses. 2+ edema both feet and lower legs.       Assessment & Plan:     1. Essential hypertension  - Comprehensive metabolic panel  2. Hypothyroidism, unspecified type Tolerating levothyroxine well.  - T4 AND TSH  3. Cirrhosis, non-alcoholic ([place]) Follow up Dr. Drue Novel 07-14-17 as scheduled.  - VITAMIN D 25 Hydroxy (Vit-D Deficiency, Fractures)  4. Other ascites Continue spirnolactone 50 BID  5. Edema, unspecified type He states he is only taking furosemide 40mg  once a day. He is to double to twice a day while awaiting lab results.         Lelon Huh, MD  [facility]

## 2017-06-21 LAB — COMPREHENSIVE METABOLIC PANEL
A/G RATIO: 1.2 (ref 1.2–2.2)
ALK PHOS: 139 IU/L — AB (ref 39–117)
ALT: 23 IU/L (ref 0–44)
AST: 29 IU/L (ref 0–40)
Albumin: 4.3 g/dL (ref 3.5–5.5)
BILIRUBIN TOTAL: 0.5 mg/dL (ref 0.0–1.2)
BUN/Creatinine Ratio: 13 (ref 9–20)
BUN: 15 mg/dL (ref 6–24)
CALCIUM: 9.5 mg/dL (ref 8.7–10.2)
CHLORIDE: 97 mmol/L (ref 96–106)
CO2: 23 mmol/L (ref 20–29)
Creatinine, Ser: 1.18 mg/dL (ref 0.76–1.27)
GFR calc Af Amer: 79 mL/min/{1.73_m2} (ref >59)
GFR, EST NON AFRICAN AMERICAN: 68 mL/min/{1.73_m2} (ref >59)
Globulin, Total: 3.5 g/dL (ref 1.5–4.5)
Glucose: 164 mg/dL — ABNORMAL HIGH (ref 65–99)
POTASSIUM: 4.3 mmol/L (ref 3.5–5.2)
Sodium: 140 mmol/L (ref 134–144)
Total Protein: 7.8 g/dL (ref 6.0–8.5)

## 2017-06-21 LAB — T4 AND TSH
T4 TOTAL: 7.2 ug/dL (ref 4.5–12.0)
TSH: 4.25 u[IU]/mL (ref 0.450–4.500)

## 2017-07-11 DIAGNOSIS — K7469 Other cirrhosis of liver: Secondary | ICD-10-CM | POA: Diagnosis not present

## 2017-07-11 DIAGNOSIS — I868 Varicose veins of other specified sites: Secondary | ICD-10-CM | POA: Diagnosis not present

## 2017-07-11 DIAGNOSIS — C220 Liver cell carcinoma: Secondary | ICD-10-CM | POA: Diagnosis not present

## 2017-07-11 DIAGNOSIS — K7460 Unspecified cirrhosis of liver: Secondary | ICD-10-CM | POA: Diagnosis not present

## 2017-07-11 DIAGNOSIS — R161 Splenomegaly, not elsewhere classified: Secondary | ICD-10-CM | POA: Diagnosis not present

## 2017-07-11 DIAGNOSIS — K766 Portal hypertension: Secondary | ICD-10-CM | POA: Diagnosis not present

## 2017-07-14 DIAGNOSIS — Z888 Allergy status to other drugs, medicaments and biological substances status: Secondary | ICD-10-CM | POA: Diagnosis not present

## 2017-07-14 DIAGNOSIS — K7290 Hepatic failure, unspecified without coma: Secondary | ICD-10-CM | POA: Diagnosis not present

## 2017-07-14 DIAGNOSIS — E663 Overweight: Secondary | ICD-10-CM | POA: Diagnosis not present

## 2017-07-14 DIAGNOSIS — R161 Splenomegaly, not elsewhere classified: Secondary | ICD-10-CM | POA: Diagnosis not present

## 2017-07-14 DIAGNOSIS — M5126 Other intervertebral disc displacement, lumbar region: Secondary | ICD-10-CM | POA: Diagnosis not present

## 2017-07-14 DIAGNOSIS — R188 Other ascites: Secondary | ICD-10-CM | POA: Diagnosis not present

## 2017-07-14 DIAGNOSIS — F4310 Post-traumatic stress disorder, unspecified: Secondary | ICD-10-CM | POA: Diagnosis not present

## 2017-07-14 DIAGNOSIS — Z886 Allergy status to analgesic agent status: Secondary | ICD-10-CM | POA: Diagnosis not present

## 2017-07-14 DIAGNOSIS — G4700 Insomnia, unspecified: Secondary | ICD-10-CM | POA: Diagnosis not present

## 2017-07-14 DIAGNOSIS — K219 Gastro-esophageal reflux disease without esophagitis: Secondary | ICD-10-CM | POA: Diagnosis not present

## 2017-07-14 DIAGNOSIS — Z79899 Other long term (current) drug therapy: Secondary | ICD-10-CM | POA: Diagnosis not present

## 2017-07-14 DIAGNOSIS — Z6836 Body mass index (BMI) 36.0-36.9, adult: Secondary | ICD-10-CM | POA: Diagnosis not present

## 2017-07-14 DIAGNOSIS — Z885 Allergy status to narcotic agent status: Secondary | ICD-10-CM | POA: Diagnosis not present

## 2017-07-14 DIAGNOSIS — K4090 Unilateral inguinal hernia, without obstruction or gangrene, not specified as recurrent: Secondary | ICD-10-CM | POA: Diagnosis not present

## 2017-07-14 DIAGNOSIS — I864 Gastric varices: Secondary | ICD-10-CM | POA: Diagnosis not present

## 2017-07-14 DIAGNOSIS — E079 Disorder of thyroid, unspecified: Secondary | ICD-10-CM | POA: Diagnosis not present

## 2017-07-14 DIAGNOSIS — Z8619 Personal history of other infectious and parasitic diseases: Secondary | ICD-10-CM | POA: Diagnosis not present

## 2017-07-14 DIAGNOSIS — F329 Major depressive disorder, single episode, unspecified: Secondary | ICD-10-CM | POA: Diagnosis not present

## 2017-07-14 DIAGNOSIS — Z5181 Encounter for therapeutic drug level monitoring: Secondary | ICD-10-CM | POA: Diagnosis not present

## 2017-07-14 DIAGNOSIS — K7460 Unspecified cirrhosis of liver: Secondary | ICD-10-CM | POA: Diagnosis not present

## 2017-07-21 ENCOUNTER — Ambulatory Visit (INDEPENDENT_AMBULATORY_CARE_PROVIDER_SITE_OTHER): Payer: Medicare Other | Admitting: Psychiatry

## 2017-07-21 DIAGNOSIS — F331 Major depressive disorder, recurrent, moderate: Secondary | ICD-10-CM

## 2017-07-21 DIAGNOSIS — F5101 Primary insomnia: Secondary | ICD-10-CM | POA: Diagnosis not present

## 2017-07-21 MED ORDER — ESCITALOPRAM OXALATE 10 MG PO TABS: 10.0000 mg | ORAL_TABLET | Freq: Every day | ORAL | 1 refills | Status: DC

## 2017-07-21 MED ORDER — VENLAFAXINE HCL ER 75 MG PO CP24: 75.0000 mg | ORAL_CAPSULE | Freq: Every day | ORAL | 1 refills | Status: DC

## 2017-07-21 MED ORDER — PRAZOSIN HCL 5 MG PO CAPS: 5.0000 mg | ORAL_CAPSULE | Freq: Every day | ORAL | 3 refills | Status: DC

## 2017-07-21 MED ORDER — LORAZEPAM 0.5 MG PO TABS: 0.5000 mg | ORAL_TABLET | Freq: Every day | ORAL | 1 refills | Status: DC

## 2017-07-21 MED ORDER — ESCITALOPRAM OXALATE 20 MG PO TABS: 20.0000 mg | ORAL_TABLET | Freq: Every day | ORAL | 3 refills | Status: DC
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
## 2017-08-09 ENCOUNTER — Other Ambulatory Visit: Payer: Self-pay | Admitting: Family Medicine

## 2017-08-20 ENCOUNTER — Telehealth: Payer: Self-pay | Admitting: Family Medicine

## 2017-09-02 ENCOUNTER — Ambulatory Visit (INDEPENDENT_AMBULATORY_CARE_PROVIDER_SITE_OTHER): Payer: Medicare Other | Admitting: Family Medicine

## 2017-09-02 ENCOUNTER — Encounter: Payer: Self-pay | Admitting: Family Medicine

## 2017-09-02 VITALS — BP 104/60 | HR 73 | Temp 97.4°F | Resp 16 | Ht 71.0 in | Wt 258.0 lb

## 2017-09-02 DIAGNOSIS — Z23 Encounter for immunization: Secondary | ICD-10-CM | POA: Diagnosis not present

## 2017-09-02 DIAGNOSIS — K4021 Bilateral inguinal hernia, without obstruction or gangrene, recurrent: Secondary | ICD-10-CM | POA: Diagnosis not present

## 2017-09-02 DIAGNOSIS — M5020 Other cervical disc displacement, unspecified cervical region: Secondary | ICD-10-CM

## 2017-09-02 MED ORDER — GABAPENTIN 300 MG PO CAPS: ORAL_CAPSULE | ORAL | 5 refills | Status: DC

## 2017-09-02 NOTE — Progress Notes (Signed)
Patient: Ray Robles Male    DOB: [birth year]   56 y.o.   MRN: [id number] Visit Date: 09/02/2017  Today's Provider: Lelon Huh, MD   Chief Complaint  Patient presents with  . Hernia   Subjective:    HPI Patient reports having pain in groin area for 2 months.Patient stated that he had hernia repair 9/13 by Dr. Jamal Collin however hernia pushed back through shortly after. He was seen by Dr. Jamal Collin in May 2017 with small bilateral hernia, but patient did not want to pursue repair at that time. However has become significantly more painful since that time and would like to see another surgeon to discuss repair. .   Allergies  Allergen Reactions  . Ambien [Zolpidem Tartrate] Other (See Comments)    Reaction: Violent sleep  . Ibuprofen Other (See Comments)    Can't take because of liver  . Morphine Diarrhea and Nausea And Vomiting  . Morphine And Related Diarrhea and Nausea And Vomiting  . Tylenol [Acetaminophen] Other (See Comments)    Can't take because of liver  . Xanax [Alprazolam] Other (See Comments)    Brings the ammonia levels up  . Zolpidem Other (See Comments)    Reaction: Violent Sleep     Current Outpatient Prescriptions:  .  buprenorphine-naloxone (SUBOXONE) 2-0.5 mg SUBL SL tablet, Take 2 tabs qam, 1 tab qpm, and 2 tabs qhs, Disp: , Rfl:  .  diphenoxylate-atropine (LOMOTIL) 2.5-0.025 MG per tablet, Take 2 tablets by mouth every 6 (six) hours as needed for diarrhea or loose stools. , Disp: , Rfl:  .  escitalopram (LEXAPRO) 10 MG tablet, Take 1 tablet (10 mg total) by mouth daily., Disp: 30 tablet, Rfl: 1 .  furosemide (LASIX) 40 MG tablet, Take 1 tablet (40 mg total) by mouth 2 (two) times daily., Disp: 1 tablet, Rfl: 1 .  gabapentin (NEURONTIN) 300 MG capsule, Take 1 capsule (300 mg total) by mouth 2 (two) times daily., Disp: 30 capsule, Rfl: 3 .  hyoscyamine (ANASPAZ) 0.125 MG TBDP disintergrating tablet, Place 0.125 mg under the tongue every 4 (four) hours as  needed for bladder spasms or cramping., Disp: , Rfl:  .  lactulose (CHRONULAC) 10 GM/15ML solution, TAKE 30 MLS BY MOUTH 3 TIMES DAILY, Disp: 240 mL, Rfl: 5 .  levothyroxine (SYNTHROID, LEVOTHROID) 75 MCG tablet, Take 1 tablet (75 mcg total) by mouth daily., Disp: 30 tablet, Rfl: 3 .  LORazepam (ATIVAN) 0.5 MG tablet, Take 1 tablet (0.5 mg total) by mouth at bedtime., Disp: 30 tablet, Rfl: 1 .  metoprolol tartrate (LOPRESSOR) 25 MG tablet, TAKE 1 TABLET BY MOUTH TWICE DAILY, Disp: 30 tablet, Rfl: 12 .  metroNIDAZOLE (METROCREAM) 0.75 % cream, Apply topically 2 (two) times daily., Disp: 45 g, Rfl: 0 .  nitroGLYCERIN (NITROSTAT) 0.4 MG SL tablet, Place 0.4 mg under the tongue every 5 (five) minutes as needed for chest pain., Disp: , Rfl:  .  nystatin cream (MYCOSTATIN), Apply 1 application topically 3 (three) times daily as needed (rash). , Disp: , Rfl:  .  omeprazole (PRILOSEC) 20 MG capsule, TAKE 2 CAPSULES BY MOUTH ONCE DAILY, Disp: 60 capsule, Rfl: 12 .  ondansetron (ZOFRAN-ODT) 4 MG disintegrating tablet, Take 4 mg by mouth every 8 (eight) hours as needed for nausea or vomiting., Disp: , Rfl:  .  prazosin (MINIPRESS) 5 MG capsule, Take 1 capsule (5 mg total) by mouth at bedtime., Disp: 30 capsule, Rfl: 3 .  QUEtiapine (SEROQUEL) 200 MG  tablet, Take 1.5 tablets (300 mg total) by mouth at bedtime., Disp: 45 tablet, Rfl: 3 .  sildenafil (REVATIO) 20 MG tablet, Take 2-3 tablets by mouth as needed (erectile dysfunction). No more than 3 in a day, Disp: , Rfl:  .  spironolactone (ALDACTONE) 50 MG tablet, Take 1 tablet (50 mg total) by mouth daily. (Patient taking differently: Take 50 mg by mouth 2 (two) times daily. ), Disp: 30 tablet, Rfl: 0 .  venlafaxine XR (EFFEXOR-XR) 75 MG 24 hr capsule, Take 1 capsule (75 mg total) by mouth daily with breakfast., Disp: 30 capsule, Rfl: 1  Review of Systems  Constitutional: Negative for appetite change, chills and fever.  Respiratory: Negative for chest  tightness, shortness of breath and wheezing.   Cardiovascular: Negative for chest pain and palpitations.  Gastrointestinal: Negative for abdominal pain, nausea and vomiting.    Social History  Substance Use Topics  . Smoking status: Never Smoker  . Smokeless tobacco: Never Used  . Alcohol use No   Objective:   BP 104/60 (BP Location: Right Arm, Patient Position: Sitting, Cuff Size: Large)   Pulse 73   Temp (!) 97.4 F (36.3 C) (Oral)   Resp 16   Ht 5\' 11"  (1.803 m)   Wt 258 lb (117 kg)   SpO2 96%   BMI 35.98 kg/m     Physical Exam  General Appearance:    Alert, cooperative, no distress  Eyes:    PERRL, conjunctiva/corneas clear, EOM's intact       Lungs:     Clear to auscultation bilaterally, respirations unlabored  Heart:    Regular rate and rhythm  Abdomen:   . bilateral inguinal hernia, small        Assessment & Plan:     1. Bilateral recurrent inguinal hernia without obstruction or gangrene  - Ambulatory referral to General Surgery  2. Herniated cervical disc He has been out of Neurontin for a few months and would like to get back on it.  - gabapentin (NEURONTIN) 300 MG capsule; Take one at bedtime for 7 days, then increase one capsule twice a day  Dispense: 60 capsule; Refill: 5  3. Need for influenza vaccination  - Flu Vaccine QUAD 36+ mos IM       Lelon Huh, MD  [facility]

## 2017-09-09 ENCOUNTER — Other Ambulatory Visit: Payer: Self-pay | Admitting: Psychiatry

## 2017-09-09 ENCOUNTER — Other Ambulatory Visit: Payer: Self-pay | Admitting: Family Medicine

## 2017-09-10 ENCOUNTER — Other Ambulatory Visit: Payer: Self-pay | Admitting: Surgery

## 2017-09-10 DIAGNOSIS — Z01812 Encounter for preprocedural laboratory examination: Secondary | ICD-10-CM | POA: Diagnosis not present

## 2017-09-10 DIAGNOSIS — R1031 Right lower quadrant pain: Secondary | ICD-10-CM | POA: Diagnosis not present

## 2017-09-10 DIAGNOSIS — R1030 Lower abdominal pain, unspecified: Secondary | ICD-10-CM

## 2017-09-12 DIAGNOSIS — Z01812 Encounter for preprocedural laboratory examination: Secondary | ICD-10-CM | POA: Diagnosis not present

## 2017-09-15 ENCOUNTER — Ambulatory Visit: Payer: Medicare Other | Admitting: Psychiatry

## 2017-09-18 ENCOUNTER — Ambulatory Visit: Payer: Medicare Other

## 2017-09-19 ENCOUNTER — Ambulatory Visit
Admission: RE | Admit: 2017-09-19 | Discharge: 2017-09-19 | Disposition: A | Discharge status: Home / Self Care | Payer: Medicare Other | Source: Ambulatory Visit | Attending: Surgery | Admitting: Surgery

## 2017-09-19 DIAGNOSIS — K449 Diaphragmatic hernia without obstruction or gangrene: Secondary | ICD-10-CM | POA: Diagnosis not present

## 2017-09-19 DIAGNOSIS — R1030 Lower abdominal pain, unspecified: Secondary | ICD-10-CM

## 2017-09-19 DIAGNOSIS — R1031 Right lower quadrant pain: Secondary | ICD-10-CM | POA: Diagnosis not present

## 2017-09-19 DIAGNOSIS — K4020 Bilateral inguinal hernia, without obstruction or gangrene, not specified as recurrent: Secondary | ICD-10-CM | POA: Insufficient documentation

## 2017-09-19 MED ORDER — IOPAMIDOL (ISOVUE-300) INJECTION 61%
100.0000 mL | Freq: Once | INTRAVENOUS | Status: AC | PRN
  Administered 2017-09-19: 100 mL via INTRAVENOUS

## 2017-09-22 ENCOUNTER — Other Ambulatory Visit: Payer: Self-pay | Admitting: Psychiatry

## 2017-09-22 ENCOUNTER — Other Ambulatory Visit: Payer: Self-pay | Admitting: Family Medicine

## 2017-09-22 DIAGNOSIS — E039 Hypothyroidism, unspecified: Secondary | ICD-10-CM

## 2017-09-30 ENCOUNTER — Telehealth: Payer: Self-pay

## 2017-09-30 ENCOUNTER — Other Ambulatory Visit (HOSPITAL_COMMUNITY): Payer: Self-pay | Admitting: Psychiatry

## 2017-09-30 DIAGNOSIS — F331 Major depressive disorder, recurrent, moderate: Secondary | ICD-10-CM

## 2017-09-30 MED ORDER — ESCITALOPRAM OXALATE 10 MG PO TABS: 10.0000 mg | ORAL_TABLET | Freq: Every day | ORAL | 0 refills | Status: DC

## 2017-09-30 MED ORDER — QUETIAPINE FUMARATE 200 MG PO TABS: 300.0000 mg | ORAL_TABLET | Freq: Every day | ORAL | 0 refills | Status: DC

## 2017-09-30 MED ORDER — VENLAFAXINE HCL ER 75 MG PO CP24: 75.0000 mg | ORAL_CAPSULE | Freq: Every day | ORAL | 0 refills | Status: DC

## 2017-09-30 NOTE — Telephone Encounter (Signed)
pt called left message that he needs a refill on all his medications. pt needs lexapro, ativan, effexor, seroquel, pt was last seen on  07-21-17 next appt 10-20-17.

## 2017-09-30 NOTE — Telephone Encounter (Signed)
Ordered meds for 30 days, except ativan.

## 2017-10-07 ENCOUNTER — Other Ambulatory Visit: Payer: Self-pay | Admitting: Family Medicine

## 2017-10-07 DIAGNOSIS — I10 Essential (primary) hypertension: Secondary | ICD-10-CM

## 2017-10-09 ENCOUNTER — Telehealth: Payer: Self-pay | Admitting: Family Medicine

## 2017-10-09 DIAGNOSIS — K4091 Unilateral inguinal hernia, without obstruction or gangrene, recurrent: Secondary | ICD-10-CM | POA: Diagnosis not present

## 2017-10-20 ENCOUNTER — Encounter: Payer: Self-pay | Admitting: Psychiatry

## 2017-10-20 ENCOUNTER — Other Ambulatory Visit: Payer: Self-pay

## 2017-10-20 ENCOUNTER — Ambulatory Visit (INDEPENDENT_AMBULATORY_CARE_PROVIDER_SITE_OTHER): Payer: Medicare Other | Admitting: Psychiatry

## 2017-10-20 VITALS — BP 134/75 | HR 73 | Temp 98.9°F | Wt 252.4 lb

## 2017-10-20 DIAGNOSIS — F5101 Primary insomnia: Secondary | ICD-10-CM

## 2017-10-20 DIAGNOSIS — F331 Major depressive disorder, recurrent, moderate: Secondary | ICD-10-CM | POA: Diagnosis not present

## 2017-10-20 MED ORDER — VENLAFAXINE HCL ER 150 MG PO CP24: 150.0000 mg | ORAL_CAPSULE | Freq: Every day | ORAL | 2 refills | Status: DC

## 2017-10-20 MED ORDER — ESCITALOPRAM OXALATE 5 MG PO TABS: 5.0000 mg | ORAL_TABLET | Freq: Every day | ORAL | 1 refills | Status: DC

## 2017-10-20 MED ORDER — QUETIAPINE FUMARATE 300 MG PO TABS: 300.0000 mg | ORAL_TABLET | Freq: Every day | ORAL | 3 refills | Status: DC

## 2017-10-20 MED ORDER — ESCITALOPRAM OXALATE 10 MG PO TABS: 5.0000 mg | ORAL_TABLET | Freq: Every day | ORAL | 1 refills | Status: DC

## 2017-10-20 MED ORDER — PRAZOSIN HCL 5 MG PO CAPS
5.0000 mg | ORAL_CAPSULE | Freq: Every day | ORAL | 3 refills | Status: DC
Start: 2017-10-20 — End: 2018-01-26
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
## 2017-10-21 ENCOUNTER — Other Ambulatory Visit: Payer: Self-pay

## 2017-10-21 ENCOUNTER — Encounter
Admission: RE | Admit: 2017-10-21 | Discharge: 2017-10-21 | Disposition: A | Discharge status: Home / Self Care | Payer: Medicare Other | Source: Ambulatory Visit | Attending: Surgery | Admitting: Surgery

## 2017-10-21 DIAGNOSIS — Z01818 Encounter for other preprocedural examination: Secondary | ICD-10-CM | POA: Diagnosis not present

## 2017-10-21 DIAGNOSIS — I10 Essential (primary) hypertension: Secondary | ICD-10-CM | POA: Insufficient documentation

## 2017-10-21 HISTORY — DX: Gastro-esophageal reflux disease without esophagitis: K21.9

## 2017-10-21 HISTORY — DX: Hypothyroidism, unspecified: E03.9

## 2017-10-21 HISTORY — DX: Cardiac arrhythmia, unspecified: I49.9

## 2017-10-21 HISTORY — DX: Prediabetes: R73.03

## 2017-10-21 HISTORY — DX: Post-traumatic stress disorder, unspecified: F43.10

## 2017-10-21 HISTORY — DX: Dyspnea, unspecified: R06.00

## 2017-10-21 LAB — BASIC METABOLIC PANEL
ANION GAP: 9 (ref 5–15)
BUN: 11 mg/dL (ref 6–20)
CALCIUM: 8.8 mg/dL — AB (ref 8.9–10.3)
CHLORIDE: 100 mmol/L — AB (ref 101–111)
CO2: 25 mmol/L (ref 22–32)
Creatinine, Ser: 1.04 mg/dL (ref 0.61–1.24)
GFR calc Af Amer: >60 mL/min (ref >60)
GFR calc non Af Amer: >60 mL/min (ref >60)
GLUCOSE: 151 mg/dL — AB (ref 65–99)
Potassium: 4 mmol/L (ref 3.5–5.1)
Sodium: 134 mmol/L — ABNORMAL LOW (ref 135–145)

## 2017-10-21 NOTE — Patient Instructions (Signed)
Your procedure is scheduled on:10/28/17 Report to Day Surgery.[facility] SECOND FLOOR To find out your arrival time please call [phone] between 1PM - 3PM on 10/27/17.  Remember: Instructions that are not followed completely may result in serious medical risk, up to and including death, or upon the discretion of your surgeon and anesthesiologist your surgery may need to be rescheduled.     _X__ 1. Do not eat food after midnight the night before your procedure.                 No gum chewing or hard candies. You may drink clear liquids up to 2 hours                 before you are scheduled to arrive for your surgery- DO not drink clear                 liquids within 2 hours of the start of your surgery.                 Clear Liquids include:  water, apple juice without pulp, clear carbohydrate                 drink such as Clearfast of Gart[place]ade, Black Coffee or Tea (Do not add                 anything to coffee or tea).     _X__ 2.  No Alcohol for 24 hours before or after surgery.   _X__ 3.  Do Not Smoke or use e-cigarettes For 24 Hours Prior to Your Surgery.                 Do not use any chewable tobacco products for at least 6 hours prior to                 surgery.  ____  4.  Bring all medications with you on the day of surgery if instructed.   _X___  5.  Notify your doctor if there is any change in your medical condition      (cold, fever, infections).     Do not wear jewelry, make-up, hairpins, clips or nail polish. Do not wear lotions, powders, or perfumes. You may wear deodorant. Do not shave 48 hours prior to surgery. Men may shave face and neck. Do not bring valuables to the hospital.    [facility] is not responsible for any belongings or valuables.  Contacts, dentures or bridgework may not be worn into surgery. Leave your suitcase in the car. After surgery it may be brought to your room. For patients admitted to the hospital, discharge time  is determined by your treatment team.   Patients discharged the day of surgery will not be allowed to drive home.   Please read over the following fact sheets that you were given:   Surgical Site Infection Prevention          __X__ Take these medicines the morning of surgery with A SIP OF WATER:    1. LEXAPRO  2. NEURONTIN  3. LEVOTHYROXINE   4. METOPROLOL  5.OMEPRAZOLE  6.RIFAXIMIN              7. VENLAFAXINE  ____ Fleet Enema (as directed)   _X___ Use CHG Soap as directed  ____ Use inhalers on the day of surgery  ____ Stop metformin 2 days prior to surgery    ____ Take 1/2 of usual insulin dose the  night before surgery. No insulin the morning          of surgery.   ____ Stop Coumadin/Plavix/aspirin on   ____ Stop Anti-inflammatories on   __X__ Stop supplements until after surgery.   STOP MELATONIN UNTIL AFTER SURGERY  ____ Bring C-Pap to the hospital.    STOPPING SUBOXONE 24 HOURS PREOP AS INSTRUCTED BY HIS DOCTOR

## 2017-10-22 NOTE — Pre-Procedure Instructions (Signed)
Met B results sent to Dr. Nicholes Stairs and Anesthesia for review.

## 2017-10-27 MED ORDER — CEFAZOLIN SODIUM-DEXTROSE 2-4 GM/100ML-% IV SOLN
2.0000 g | Freq: Once | INTRAVENOUS | Status: AC
  Administered 2017-10-28: 2 g via INTRAVENOUS

## 2017-10-28 ENCOUNTER — Ambulatory Visit: Payer: Medicare Other | Admitting: Registered Nurse

## 2017-10-28 ENCOUNTER — Other Ambulatory Visit: Payer: Self-pay

## 2017-10-28 ENCOUNTER — Ambulatory Visit
Admission: RE | Admit: 2017-10-28 | Discharge: 2017-10-28 | Disposition: A | Discharge status: Home / Self Care | Payer: Medicare Other | Source: Ambulatory Visit | Attending: Surgery | Admitting: Surgery

## 2017-10-28 ENCOUNTER — Encounter
Admission: RE | Disposition: A | Discharge status: Home / Self Care | Payer: Self-pay | Source: Ambulatory Visit | Attending: Surgery

## 2017-10-28 DIAGNOSIS — I10 Essential (primary) hypertension: Secondary | ICD-10-CM | POA: Diagnosis not present

## 2017-10-28 DIAGNOSIS — M47816 Spondylosis without myelopathy or radiculopathy, lumbar region: Secondary | ICD-10-CM | POA: Insufficient documentation

## 2017-10-28 DIAGNOSIS — K7469 Other cirrhosis of liver: Secondary | ICD-10-CM | POA: Insufficient documentation

## 2017-10-28 DIAGNOSIS — M47812 Spondylosis without myelopathy or radiculopathy, cervical region: Secondary | ICD-10-CM | POA: Diagnosis not present

## 2017-10-28 DIAGNOSIS — F329 Major depressive disorder, single episode, unspecified: Secondary | ICD-10-CM | POA: Diagnosis not present

## 2017-10-28 DIAGNOSIS — D696 Thrombocytopenia, unspecified: Secondary | ICD-10-CM | POA: Diagnosis not present

## 2017-10-28 DIAGNOSIS — G4730 Sleep apnea, unspecified: Secondary | ICD-10-CM | POA: Diagnosis not present

## 2017-10-28 DIAGNOSIS — Z888 Allergy status to other drugs, medicaments and biological substances status: Secondary | ICD-10-CM | POA: Insufficient documentation

## 2017-10-28 DIAGNOSIS — D72819 Decreased white blood cell count, unspecified: Secondary | ICD-10-CM | POA: Insufficient documentation

## 2017-10-28 DIAGNOSIS — Z79899 Other long term (current) drug therapy: Secondary | ICD-10-CM | POA: Insufficient documentation

## 2017-10-28 DIAGNOSIS — Z886 Allergy status to analgesic agent status: Secondary | ICD-10-CM | POA: Insufficient documentation

## 2017-10-28 DIAGNOSIS — E039 Hypothyroidism, unspecified: Secondary | ICD-10-CM | POA: Insufficient documentation

## 2017-10-28 DIAGNOSIS — N529 Male erectile dysfunction, unspecified: Secondary | ICD-10-CM | POA: Diagnosis not present

## 2017-10-28 DIAGNOSIS — K219 Gastro-esophageal reflux disease without esophagitis: Secondary | ICD-10-CM | POA: Insufficient documentation

## 2017-10-28 DIAGNOSIS — Z885 Allergy status to narcotic agent status: Secondary | ICD-10-CM | POA: Insufficient documentation

## 2017-10-28 DIAGNOSIS — G6289 Other specified polyneuropathies: Secondary | ICD-10-CM | POA: Insufficient documentation

## 2017-10-28 DIAGNOSIS — K766 Portal hypertension: Secondary | ICD-10-CM | POA: Diagnosis not present

## 2017-10-28 DIAGNOSIS — I471 Supraventricular tachycardia: Secondary | ICD-10-CM | POA: Diagnosis not present

## 2017-10-28 DIAGNOSIS — Z6834 Body mass index (BMI) 34.0-34.9, adult: Secondary | ICD-10-CM | POA: Insufficient documentation

## 2017-10-28 DIAGNOSIS — Z8711 Personal history of peptic ulcer disease: Secondary | ICD-10-CM | POA: Insufficient documentation

## 2017-10-28 DIAGNOSIS — K4091 Unilateral inguinal hernia, without obstruction or gangrene, recurrent: Secondary | ICD-10-CM | POA: Diagnosis not present

## 2017-10-28 DIAGNOSIS — F4310 Post-traumatic stress disorder, unspecified: Secondary | ICD-10-CM | POA: Diagnosis not present

## 2017-10-28 DIAGNOSIS — R7303 Prediabetes: Secondary | ICD-10-CM | POA: Diagnosis not present

## 2017-10-28 DIAGNOSIS — R011 Cardiac murmur, unspecified: Secondary | ICD-10-CM | POA: Insufficient documentation

## 2017-10-28 DIAGNOSIS — B182 Chronic viral hepatitis C: Secondary | ICD-10-CM | POA: Insufficient documentation

## 2017-10-28 DIAGNOSIS — R0602 Shortness of breath: Secondary | ICD-10-CM | POA: Insufficient documentation

## 2017-10-28 HISTORY — PX: INGUINAL HERNIA REPAIR: SHX194

## 2017-10-28 LAB — GLUCOSE, CAPILLARY
GLUCOSE-CAPILLARY: 170 mg/dL — AB (ref 65–99)
GLUCOSE-CAPILLARY: 138 mg/dL — AB (ref 65–99)

## 2017-10-28 LAB — MAGNESIUM: Magnesium: 1.9 mg/dL (ref 1.7–2.4)

## 2017-10-28 LAB — POTASSIUM: POTASSIUM: 4.4 mmol/L (ref 3.5–5.1)

## 2017-10-28 SURGERY — REPAIR, HERNIA, INGUINAL, ADULT
Anesthesia: General | Laterality: Right

## 2017-10-28 MED ORDER — ETOMIDATE 2 MG/ML IV SOLN
INTRAVENOUS | Status: AC
  Filled 2017-10-28: qty 10

## 2017-10-28 MED ORDER — METOPROLOL TARTRATE 5 MG/5ML IV SOLN
INTRAVENOUS | Status: DC | PRN
  Administered 2017-10-28: 1 mg via INTRAVENOUS
  Administered 2017-10-28 (×3): 2 mg via INTRAVENOUS
  Administered 2017-10-28: 1 mg via INTRAVENOUS
  Administered 2017-10-28: 2 mg via INTRAVENOUS

## 2017-10-28 MED ORDER — SUGAMMADEX SODIUM 200 MG/2ML IV SOLN
INTRAVENOUS | Status: DC | PRN
  Administered 2017-10-28: 200 mg via INTRAVENOUS

## 2017-10-28 MED ORDER — ROCURONIUM BROMIDE 50 MG/5ML IV SOLN
INTRAVENOUS | Status: AC
  Filled 2017-10-28: qty 1

## 2017-10-28 MED ORDER — OXYCODONE HCL 5 MG PO TABS: ORAL_TABLET | ORAL | 0 refills | Status: DC

## 2017-10-28 MED ORDER — BUPIVACAINE-EPINEPHRINE (PF) 0.5% -1:200000 IJ SOLN
INTRAMUSCULAR | Status: AC
  Filled 2017-10-28: qty 30

## 2017-10-28 MED ORDER — LIDOCAINE HCL (PF) 2 % IJ SOLN
INTRAMUSCULAR | Status: AC
  Filled 2017-10-28: qty 10

## 2017-10-28 MED ORDER — LIDOCAINE HCL (CARDIAC) 20 MG/ML IV SOLN
INTRAVENOUS | Status: DC | PRN
  Administered 2017-10-28: 100 mg via INTRAVENOUS

## 2017-10-28 MED ORDER — OXYCODONE HCL 5 MG PO TABS
5.0000 mg | ORAL_TABLET | Freq: Once | ORAL | Status: AC
  Administered 2017-10-28: 5 mg via ORAL

## 2017-10-28 MED ORDER — GLYCOPYRROLATE 0.2 MG/ML IJ SOLN
INTRAMUSCULAR | Status: DC | PRN
  Administered 2017-10-28: 0.2 mg via INTRAVENOUS

## 2017-10-28 MED ORDER — PROPOFOL 10 MG/ML IV BOLUS
INTRAVENOUS | Status: AC
  Filled 2017-10-28: qty 20

## 2017-10-28 MED ORDER — FENTANYL CITRATE (PF) 100 MCG/2ML IJ SOLN
INTRAMUSCULAR | Status: AC
  Filled 2017-10-28: qty 2

## 2017-10-28 MED ORDER — FENTANYL CITRATE (PF) 100 MCG/2ML IJ SOLN
25.0000 ug | INTRAMUSCULAR | Status: DC | PRN
  Administered 2017-10-28 (×4): 25 ug via INTRAVENOUS

## 2017-10-28 MED ORDER — LACTATED RINGERS IV SOLN
INTRAVENOUS | Status: DC
  Administered 2017-10-28 (×2): via INTRAVENOUS

## 2017-10-28 MED ORDER — METOPROLOL TARTRATE 5 MG/5ML IV SOLN
INTRAVENOUS | Status: AC
  Filled 2017-10-28: qty 5

## 2017-10-28 MED ORDER — ONDANSETRON HCL 4 MG/2ML IJ SOLN: 4.0000 mg | Freq: Once | INTRAMUSCULAR | Status: DC | PRN

## 2017-10-28 MED ORDER — ROCURONIUM BROMIDE 100 MG/10ML IV SOLN
INTRAVENOUS | Status: DC | PRN
  Administered 2017-10-28: 10 mg via INTRAVENOUS
  Administered 2017-10-28: 20 mg via INTRAVENOUS
  Administered 2017-10-28: 40 mg via INTRAVENOUS

## 2017-10-28 MED ORDER — FENTANYL CITRATE (PF) 100 MCG/2ML IJ SOLN
INTRAMUSCULAR | Status: AC
  Administered 2017-10-28: 25 ug via INTRAVENOUS
  Filled 2017-10-28: qty 2

## 2017-10-28 MED ORDER — ONDANSETRON HCL 4 MG/2ML IJ SOLN
INTRAMUSCULAR | Status: DC | PRN
  Administered 2017-10-28: 4 mg via INTRAVENOUS

## 2017-10-28 MED ORDER — MIDAZOLAM HCL 2 MG/2ML IJ SOLN
INTRAMUSCULAR | Status: DC | PRN
  Administered 2017-10-28: 2 mg via INTRAVENOUS

## 2017-10-28 MED ORDER — ONDANSETRON HCL 4 MG/2ML IJ SOLN: INTRAMUSCULAR | Status: DC | PRN

## 2017-10-28 MED ORDER — GLYCOPYRROLATE 0.2 MG/ML IJ SOLN
INTRAMUSCULAR | Status: AC
  Filled 2017-10-28: qty 1

## 2017-10-28 MED ORDER — ETOMIDATE 2 MG/ML IV SOLN
INTRAVENOUS | Status: DC | PRN
  Administered 2017-10-28: 6 mg via INTRAVENOUS
  Administered 2017-10-28: 14 mg via INTRAVENOUS

## 2017-10-28 MED ORDER — DEXAMETHASONE SODIUM PHOSPHATE 10 MG/ML IJ SOLN
INTRAMUSCULAR | Status: DC | PRN
  Administered 2017-10-28: 10 mg via INTRAVENOUS

## 2017-10-28 MED ORDER — FENTANYL CITRATE (PF) 100 MCG/2ML IJ SOLN
INTRAMUSCULAR | Status: DC | PRN
  Administered 2017-10-28: 25 ug via INTRAVENOUS
  Administered 2017-10-28 (×3): 50 ug via INTRAVENOUS
  Administered 2017-10-28: 25 ug via INTRAVENOUS

## 2017-10-28 MED ORDER — MIDAZOLAM HCL 2 MG/2ML IJ SOLN
INTRAMUSCULAR | Status: AC
  Filled 2017-10-28: qty 2

## 2017-10-28 MED ORDER — OXYCODONE HCL 5 MG PO TABS
ORAL_TABLET | ORAL | Status: AC
  Administered 2017-10-28: 5 mg via ORAL
  Filled 2017-10-28: qty 1

## 2017-10-28 MED ORDER — CEFAZOLIN SODIUM-DEXTROSE 2-4 GM/100ML-% IV SOLN
INTRAVENOUS | Status: AC
  Filled 2017-10-28: qty 100

## 2017-10-28 MED ORDER — BUPIVACAINE-EPINEPHRINE (PF) 0.5% -1:200000 IJ SOLN
INTRAMUSCULAR | Status: DC | PRN
  Administered 2017-10-28: 18 mL

## 2017-10-28 SURGICAL SUPPLY — 27 items
BLADE SURG 15 STRL LF DISP TIS (BLADE) ×1 IMPLANT
BLADE SURG 15 STRL SS (BLADE) ×2
CANISTER SUCT 1200ML W/VALVE (MISCELLANEOUS) ×3 IMPLANT
CHLORAPREP W/TINT 26ML (MISCELLANEOUS) ×3 IMPLANT
DERMABOND ADVANCED (GAUZE/BANDAGES/DRESSINGS) ×2
DERMABOND ADVANCED .7 DNX12 (GAUZE/BANDAGES/DRESSINGS) ×1 IMPLANT
DRAIN PENROSE 5/8X18 LTX STRL (WOUND CARE) ×3 IMPLANT
DRAPE LAPAROTOMY 77X122 PED (DRAPES) ×3 IMPLANT
DRAPE LAPAROTOMY TRNSV 106X77 (MISCELLANEOUS) IMPLANT
ELECT REM PT RETURN 9FT ADLT (ELECTROSURGICAL) ×3
ELECTRODE REM PT RTRN 9FT ADLT (ELECTROSURGICAL) ×1 IMPLANT
GLOVE BIO SURGEON STRL SZ7.5 (GLOVE) ×3 IMPLANT
GOWN STRL REUS W/ TWL LRG LVL3 (GOWN DISPOSABLE) ×3 IMPLANT
GOWN STRL REUS W/TWL LRG LVL3 (GOWN DISPOSABLE) ×6
KIT RM TURNOVER STRD PROC AR (KITS) ×3 IMPLANT
LABEL OR SOLS (LABEL) IMPLANT
MESH SYNTHETIC 4X6 SOFT BARD (Mesh General) ×1 IMPLANT
MESH SYNTHETIC SOFT BARD 4X6 (Mesh General) ×2 IMPLANT
NEEDLE HYPO 25X1 1.5 SAFETY (NEEDLE) ×3 IMPLANT
NS IRRIG 500ML POUR BTL (IV SOLUTION) ×3 IMPLANT
PACK BASIN MINOR ARMC (MISCELLANEOUS) ×3 IMPLANT
SUT CHROMIC 4 0 RB 1X27 (SUTURE) ×6 IMPLANT
SUT MNCRL AB 4-0 PS2 18 (SUTURE) ×3 IMPLANT
SUT SURGILON 0 30 BLK (SUTURE) ×9 IMPLANT
SUT VIC AB 4-0 SH 27 (SUTURE) ×4
SUT VIC AB 4-0 SH 27XANBCTRL (SUTURE) ×2 IMPLANT
SYR 10ML LL (SYRINGE) ×3 IMPLANT

## 2017-10-28 NOTE — Anesthesia Post-op Follow-up Note (Signed)
Anesthesia QCDR form completed.        

## 2017-10-28 NOTE — Progress Notes (Signed)
Dr. Andree Elk aware of lab results, may proceed with Discharging patient home.  Make sure patient takes His evening metoprolol and document.

## 2017-10-28 NOTE — Transfer of Care (Signed)
Immediate Anesthesia Transfer of Care Note  Patient: [name]  Procedure(s) Performed: Procedure(s): HERNIA REPAIR INGUINAL ADULT (Right)  Patient Location: PACU  Anesthesia Type:General  Level of Consciousness: sedated  Airway & Oxygen Therapy: Patient Spontanous Breathing and Patient connected to face mask oxygen  Post-op Assessment: Report given to RN and Post -op Vital signs reviewed and stable  Post vital signs: Reviewed and stable  Last Vitals:  Vitals:   10/28/17 0813 10/28/17 1206  BP: (!) 148/82 140/88  Pulse: (!) 105 (!) 101  Resp: 18 14  Temp: (!) 36.2 C (!) 36.1 C  SpO2: 73% 22%    Complications: No apparent anesthesia complications

## 2017-10-28 NOTE — Anesthesia Preprocedure Evaluation (Signed)
Anesthesia Evaluation  Patient identified by MRN, date of birth, ID band Patient awake    Reviewed: Allergy & Precautions, H&P , NPO status , Patient's Chart, lab work & pertinent test results, reviewed documented beta blocker date and time   Airway Mallampati: III  TM Distance: >3 FB Neck ROM: full    Dental  (+) Edentulous Upper, Edentulous Lower   Pulmonary neg pulmonary ROS, shortness of breath and with exertion, sleep apnea ,    Pulmonary exam normal        Cardiovascular Exercise Tolerance: Poor hypertension, On Medications negative cardio ROS Normal cardiovascular exam+ dysrhythmias + Valvular Problems/Murmurs  Rhythm:regular Rate:Normal     Neuro/Psych  Headaches, PSYCHIATRIC DISORDERS  Neuromuscular disease negative neurological ROS  negative psych ROS   GI/Hepatic negative GI ROS, Neg liver ROS, GERD  Medicated,(+) Hepatitis -, C  Endo/Other  negative endocrine ROSHypothyroidism   Renal/GU Renal diseasenegative Renal ROS  negative genitourinary   Musculoskeletal   Abdominal   Peds  Hematology negative hematology ROS (+)   Anesthesia Other Findings Past Medical History: No date: Anxiety No date: Arthritis No date: Cirrhosis ([place]) No date: Depression No date: Dyspnea     Comment:  doe No date: Dysrhythmia No date: GERD (gastroesophageal reflux disease)     Comment:  food sticks in throat has esophagus stretched 11/11/2007: H/O bacterial pneumonia     Comment:  2008 and 2015  No date: Heart murmur No date: Hepatitis C No date: Hx of endoscopy     Comment:  05/12/2014- [facility]. Dr Candace Cruise. Benign appearing stricture.               Dilated the examination was otherwise normal.02/13/2012-               Beniign appearing intrinsic mild stenosis at GE junction,              successfully dialated No date: Hypertension No date: Hypothyroidism No date: Liver disease No date: Neuromuscular disorder (HCC)  Comment:  chronic pain peripheral neuropathy No date: Pre-diabetes No date: PTSD (post-traumatic stress disorder) Past Surgical History: 05/12/2015: COLONOSCOPY WITH PROPOFOL; N/A     Comment:  Procedure: COLONOSCOPY WITH PROPOFOL;  Surgeon: Hulen Luster, MD;  Location: [facility] ENDOSCOPY;  Service:               Gastroenterology;  Laterality: N/A; No date: edg 08/2012: HERNIA REPAIR     Comment:  Dr. Jamal Collin; [facility]   Reproductive/Obstetrics negative OB ROS                             Anesthesia Physical Anesthesia Plan  ASA: III  Anesthesia Plan: General ETT   Post-op Pain Management:    Induction:   PONV Risk Score and Plan: 3  Airway Management Planned:   Additional Equipment:   Intra-op Plan:   Post-operative Plan:   Informed Consent: I have reviewed the patients History and Physical, chart, labs and discussed the procedure including the risks, benefits and alternatives for the proposed anesthesia with the patient or authorized representative who has indicated his/her understanding and acceptance.   Dental Advisory Given  Plan Discussed with: CRNA  Anesthesia Plan Comments:         Anesthesia Quick Evaluation

## 2017-10-28 NOTE — Discharge Instructions (Addendum)
Take oxycodone 1 or 2 tablets each 6 hours as needed for pain.  Should not drive or do anything dangerous when taking oxycodone.  Discontinue Suboxone until no longer needing oxycodone then resume usual dose.  May shower and blot dry.  Avoid straining and heavy lifting.  Splint abdomen with pillow when changing from sitting to standing/ standing to sitting position  AMBULATORY SURGERY  DISCHARGE INSTRUCTIONS   1) The drugs that you were given will stay in your system until tomorrow so for the next 24 hours you should not:  A) Drive an automobile B) Make any legal decisions C) Drink any alcoholic beverage   2) You may resume regular meals tomorrow.  Today it is better to start with liquids and gradually work up to solid foods.  You may eat anything you prefer, but it is better to start with liquids, then soup and crackers, and gradually work up to solid foods.   3) Please notify your doctor immediately if you have any unusual bleeding, trouble breathing, redness and pain at the surgery site, drainage, fever, or pain not relieved by medication.   4) Additional Instructions:     Please contact your physician with any problems or Same Day Surgery at [phone], Monday through Friday 6 am to 4 pm, or [place] at [facility] number at [phone].

## 2017-10-28 NOTE — Progress Notes (Signed)
Magnesium and potasium drawn     will discharge pt if normal if abnormal prime doctor will see pt

## 2017-10-28 NOTE — Op Note (Signed)
IOPERATIVE REPORT  PREOPERATIVE DIAGNOSIS: Recurrent right inguinal hernia   POSTOPERATIVE DIAGNOSIS: Recurrent right inguinal hernia  PROCEDURE: Repair right inguinal hernia repair  ANESTHESIA:  General  SURGEON:  Rochel Brome M.D.  INDICATIONS: He has a past history of right inguinal hernia repair and recently has had pain in the right groin.  He had CT findings of fat-containing recurrent inguinal hernia.  Repair was recommended for definitive treatment.  With the patient on the operating table in the supine position the right lower quadrant was prepared with clippers and with ChloraPrep and draped in a sterile manner. A transversely oriented suprapubic incision was made and carried down through subcutaneous tissues. Electrocautery was used for hemostasis. The Scarpa's fascia was incised. The external oblique aponeurosis was incised along the course of its fibers to open the external ring and expose the inguinal cord structures.  Scar tissue was encountered and with a tedious dissection the external oblique aponeurosis was dissected free from underlying tissues.  The cord structures were mobilized. A Penrose drain was passed around the cord structures for traction.  The cord structures were explored demonstrating the vas deferens.  There was no indirect sac.  There was more than the normal density of tissues consistent with underlying mesh and scar tissue.  There was a defect in the floor of the inguinal canal just above the pubic tubercle containing fat fatty tissue which was dissected free from surrounding structures and reduced.  The repair was carried out with  0 Surgilon sutures suturing the conjoined tendon to the shelving edge of the inguinal ligament incorporating the old mesh into the repair.  The suture line extended from the pubic tubercle to the internal ring.  Bard soft mesh was cut to create an oval shape and was placed over the repair. This was sutured to the repair with interrupted 0  Surgilon sutures and also sutured medially to the deep fascia and on both sides of the internal ring. Next after seeing hemostasis was intact the cord structures were replaced along the floor of the inguinal canal. The cut edges of the external oblique aponeurosis were closed with a running 4-0 Vicryl suture to re-create the external ring. The deep fascia superior and lateral to the repair site was infiltrated with half percent Sensorcaine with epinephrine. Subcutaneous tissues were also infiltrated. The Scarpa's fascia was closed with interrupted 4-0 Vicryl sutures. The skin was closed with running 4-0 Monocryl subcuticular suture and LiquiBand. The testicle remained in the scrotum  The patient appeared to be in satisfactory condition and was prepared for transfer to the recovery room.  Rochel Brome M.D.

## 2017-10-28 NOTE — Anesthesia Procedure Notes (Signed)
Procedure Name: Intubation Date/Time: 10/28/2017 10:05 AM Performed by: Doreen Salvage, CRNA Pre-anesthesia Checklist: Patient identified, Patient being monitored, Timeout performed, Emergency Drugs available and Suction available Patient Re-evaluated:Patient Re-evaluated prior to induction Oxygen Delivery Method: Circle system utilized Preoxygenation: Pre-oxygenation with 100% oxygen Induction Type: IV induction Ventilation: Mask ventilation without difficulty Laryngoscope Size: Mac, McGraph and 4 Grade View: Grade I Tube type: Oral Tube size: 7.5 mm Number of attempts: 1 Airway Equipment and Method: Stylet Placement Confirmation: ETT inserted through vocal cords under direct vision,  positive ETCO2 and breath sounds checked- equal and bilateral Secured at: 23 cm Tube secured with: Tape Dental Injury: Teeth and Oropharynx as per pre-operative assessment

## 2017-10-28 NOTE — Progress Notes (Signed)
Awaiting lab results

## 2017-10-28 NOTE — Progress Notes (Signed)
EKG done for earlier event of SVT

## 2017-10-28 NOTE — OR Nursing (Signed)
Anesthesia aware patient took beta blocker 10/27/17, blood sugar 107 this am order to keep LR as Iv fluid, also notified patient drank about 4 oz of coke this am.

## 2017-10-28 NOTE — OR Nursing (Signed)
Patient has a hx of oxycodone use for chronic pain reports not having any since 3/18, currently taking suboxone, denies any other drug use, no uds per Dr Andree Elk.

## 2017-10-28 NOTE — Consult Note (Signed)
  He comes in today prepared for repair of recurrent right inguinal hernia.  He reports no change in overall condition since the recent office exam.  I have discussed his Suboxone management with his psychiatrist.  The Suboxone was discontinued yesterday and will stay off it until he completes any postoperative oxycodone.  The right side was marked YES  Lab work reviewed  I discussed the plan for repair of a recurrent right inguinal hernia.

## 2017-10-29 ENCOUNTER — Encounter: Payer: Self-pay | Admitting: Surgery

## 2017-10-29 NOTE — Anesthesia Postprocedure Evaluation (Signed)
Anesthesia Post Note  Patient: Ray Robles  Procedure(s) Performed: HERNIA REPAIR INGUINAL ADULT (Right )  Patient location during evaluation: PACU Anesthesia Type: General Level of consciousness: awake and alert Pain management: pain level controlled Vital Signs Assessment: post-procedure vital signs reviewed and stable Respiratory status: spontaneous breathing, nonlabored ventilation, respiratory function stable and patient connected to nasal cannula oxygen Cardiovascular status: blood pressure returned to baseline and stable Postop Assessment: no apparent nausea or vomiting Anesthetic complications: no     Last Vitals:  Vitals:   10/28/17 1345 10/28/17 1359  BP: (!) 149/83 (!) 156/75  Pulse: 83 84  Resp: 12 14  Temp:  36.5 C  SpO2: 95% 96%    Last Pain:  Vitals:   10/28/17 1511  TempSrc:   PainSc: 2                  Molli Barrows

## 2017-11-05 ENCOUNTER — Other Ambulatory Visit: Payer: Self-pay | Admitting: Family Medicine

## 2017-11-13 ENCOUNTER — Other Ambulatory Visit: Payer: Self-pay | Admitting: Psychiatry

## 2017-11-13 DIAGNOSIS — F331 Major depressive disorder, recurrent, moderate: Secondary | ICD-10-CM

## 2017-11-17 DIAGNOSIS — K219 Gastro-esophageal reflux disease without esophagitis: Secondary | ICD-10-CM | POA: Diagnosis not present

## 2017-11-17 DIAGNOSIS — Z79899 Other long term (current) drug therapy: Secondary | ICD-10-CM | POA: Diagnosis not present

## 2017-11-17 DIAGNOSIS — Z8619 Personal history of other infectious and parasitic diseases: Secondary | ICD-10-CM | POA: Diagnosis not present

## 2017-11-17 DIAGNOSIS — K7460 Unspecified cirrhosis of liver: Secondary | ICD-10-CM | POA: Diagnosis not present

## 2017-11-20 NOTE — Telephone Encounter (Signed)
Duplicate

## 2017-12-10 ENCOUNTER — Other Ambulatory Visit: Payer: Self-pay | Admitting: Psychiatry

## 2017-12-10 DIAGNOSIS — F331 Major depressive disorder, recurrent, moderate: Secondary | ICD-10-CM

## 2017-12-17 ENCOUNTER — Telehealth: Payer: Self-pay | Admitting: Family Medicine

## 2017-12-18 NOTE — Telephone Encounter (Signed)
No appt scheduled to date

## 2018-01-04 ENCOUNTER — Other Ambulatory Visit: Payer: Self-pay | Admitting: Psychiatry

## 2018-01-07 DIAGNOSIS — K7460 Unspecified cirrhosis of liver: Secondary | ICD-10-CM | POA: Diagnosis not present

## 2018-01-07 DIAGNOSIS — K766 Portal hypertension: Secondary | ICD-10-CM | POA: Diagnosis not present

## 2018-01-07 DIAGNOSIS — K7469 Other cirrhosis of liver: Secondary | ICD-10-CM | POA: Diagnosis not present

## 2018-01-19 ENCOUNTER — Ambulatory Visit: Payer: Medicare Other | Admitting: Psychiatry

## 2018-01-22 DIAGNOSIS — F1121 Opioid dependence, in remission: Secondary | ICD-10-CM | POA: Insufficient documentation

## 2018-01-26 ENCOUNTER — Other Ambulatory Visit: Payer: Self-pay

## 2018-01-26 ENCOUNTER — Encounter: Payer: Self-pay | Admitting: Psychiatry

## 2018-01-26 ENCOUNTER — Ambulatory Visit (INDEPENDENT_AMBULATORY_CARE_PROVIDER_SITE_OTHER): Payer: Medicare Other | Admitting: Psychiatry

## 2018-01-26 VITALS — BP 164/81 | HR 79 | Temp 98.4°F | Wt 244.8 lb

## 2018-01-26 DIAGNOSIS — F5101 Primary insomnia: Secondary | ICD-10-CM

## 2018-01-26 DIAGNOSIS — F331 Major depressive disorder, recurrent, moderate: Secondary | ICD-10-CM

## 2018-01-26 MED ORDER — PRAZOSIN HCL 5 MG PO CAPS: 5.0000 mg | ORAL_CAPSULE | Freq: Every day | ORAL | 3 refills | Status: DC

## 2018-01-26 MED ORDER — ESCITALOPRAM OXALATE 5 MG PO TABS: 5.0000 mg | ORAL_TABLET | Freq: Every day | ORAL | 1 refills | Status: DC

## 2018-01-26 MED ORDER — QUETIAPINE FUMARATE 300 MG PO TABS: 300.0000 mg | ORAL_TABLET | Freq: Every day | ORAL | 3 refills | Status: DC

## 2018-01-26 MED ORDER — VENLAFAXINE HCL ER 150 MG PO CP24: 150.0000 mg | ORAL_CAPSULE | Freq: Every day | ORAL | 2 refills | Status: DC
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
## 2018-02-10 ENCOUNTER — Telehealth: Payer: Self-pay

## 2018-02-10 NOTE — Telephone Encounter (Signed)
LM on wifes cell number requesting a CB regarding husband scheduling an AWV with the nurse.  -MM

## 2018-02-17 ENCOUNTER — Encounter: Payer: Self-pay | Admitting: Family Medicine

## 2018-02-17 ENCOUNTER — Ambulatory Visit (INDEPENDENT_AMBULATORY_CARE_PROVIDER_SITE_OTHER): Payer: Medicare Other | Admitting: Family Medicine

## 2018-02-17 VITALS — BP 120/70 | HR 70 | Temp 98.2°F | Resp 16

## 2018-02-17 DIAGNOSIS — L719 Rosacea, unspecified: Secondary | ICD-10-CM

## 2018-02-17 DIAGNOSIS — G4700 Insomnia, unspecified: Secondary | ICD-10-CM | POA: Diagnosis not present

## 2018-02-17 MED ORDER — METRONIDAZOLE 0.75 % EX CREA: TOPICAL_CREAM | Freq: Every day | CUTANEOUS | 3 refills | Status: DC

## 2018-02-17 MED ORDER — HYDROXYZINE HCL 10 MG PO TABS: 10.0000 mg | ORAL_TABLET | Freq: Every day | ORAL | 5 refills | Status: DC

## 2018-02-17 NOTE — Progress Notes (Signed)
Patient: Ray Robles Male    DOB: 08-04-1961   57 y.o.   MRN: [id number] Visit Date: 02/17/2018  Today's Provider: Lelon Huh, MD   No chief complaint on file.  Subjective:    HPI Patient reports he has had eczema on his face for some time. Patient states he usually is able to control eczema with otc lotions, however lately those do not seem to be helping. Patient has several places on his face including cheeks and forehead that get dry, flaky and red. He states it flared up two days ago, but has since improved quite a bit without treatment. It typically flares up a few times every month. Has not tried any prescription medication.   Also patient states that the zaleplon is is not effective and that he thinks it is causing some type of jerking or seizure activity when he is awake. States he stopped medication and symptoms have resolved. He has taken Ambien in the past which he states caused black outs. He cannot take benzodiazepines. He states he is already taking 2 x 10mg  melatonin everyday and still unable to stay asleep at night. .    Allergies  Allergen Reactions  . Ambien [Zolpidem Tartrate] Other (See Comments)    Reaction: Violent sleep  . Ibuprofen Other (See Comments)    Can't take because of liver  . Morphine Diarrhea and Nausea And Vomiting  . Morphine And Related Diarrhea and Nausea And Vomiting  . Tylenol [Acetaminophen] Other (See Comments)    Can't take because of liver  . Xanax [Alprazolam] Other (See Comments)    Brings the ammonia levels up  . Zolpidem Other (See Comments)    Reaction: Violent Sleep     Current Outpatient Medications:  .  buprenorphine-naloxone (SUBOXONE) 2-0.5 mg SUBL SL tablet, Place 1-2 tablets See admin instructions under the tongue. Take 2 tablets by mouth in the morning, take 1 tablet by mouth midday and take 2 tablets by mouth at bedtime, Disp: , Rfl:  .  diphenoxylate-atropine (LOMOTIL) 2.5-0.025 MG per tablet, Take 2 tablets  by mouth every 6 (six) hours as needed for diarrhea or loose stools. , Disp: , Rfl:  .  escitalopram (LEXAPRO) 5 MG tablet, Take 1 tablet (5 mg total) by mouth daily., Disp: 30 tablet, Rfl: 1 .  furosemide (LASIX) 40 MG tablet, Take 1 tablet (40 mg total) by mouth 2 (two) times daily. (Patient taking differently: Take 40 mg daily by mouth. ), Disp: 1 tablet, Rfl: 1 .  gabapentin (NEURONTIN) 300 MG capsule, Take one at bedtime for 7 days, then increase one capsule twice a day (Patient taking differently: Take 300 mg 2 (two) times daily by mouth. ), Disp: 60 capsule, Rfl: 5 .  hyoscyamine (ANASPAZ) 0.125 MG TBDP disintergrating tablet, Place 0.125 mg every 4 (four) hours as needed under the tongue for bladder spasms or cramping., Disp: , Rfl:  .  lactulose (CHRONULAC) 10 GM/15ML solution, TAKE 30 MLS BY MOUTH 3 TIMES DAILY (Patient taking differently: Take 20 gm by mouth daily), Disp: 240 mL, Rfl: 12 .  levothyroxine (SYNTHROID, LEVOTHROID) 75 MCG tablet, TAKE 1 TABLET BY MOUTH ONCE DAILY (Patient taking differently: TAKE 75 MCG BY MOUTH ONCE DAILY), Disp: 30 tablet, Rfl: 11 .  Melatonin 10 MG CAPS, Take 10 mg at bedtime by mouth., Disp: , Rfl:  .  metoprolol tartrate (LOPRESSOR) 25 MG tablet, TAKE 1 TABLET BY MOUTH TWICE DAILY (Patient taking differently: TAKE 25 MG  BY MOUTH TWICE DAILY), Disp: 30 tablet, Rfl: 12 .  nitroGLYCERIN (NITROSTAT) 0.4 MG SL tablet, Place 0.4 mg under the tongue every 5 (five) minutes as needed for chest pain., Disp: , Rfl:  .  nystatin cream (MYCOSTATIN), Apply 1 application 3 (three) times daily as needed topically (for rash). , Disp: , Rfl:  .  omeprazole (PRILOSEC) 20 MG capsule, TAKE 2 CAPSULES BY MOUTH ONCE DAILY (Patient taking differently: Take 20 mg by mouth twice daily), Disp: 60 capsule, Rfl: 12 .  ondansetron (ZOFRAN-ODT) 4 MG disintegrating tablet, Take 4 mg by mouth every 8 (eight) hours as needed for nausea or vomiting., Disp: , Rfl:  .  oxyCODONE (ROXICODONE) 5  MG immediate release tablet, 1-2 tablets each 6 hours as needed for pain, Disp: 20 tablet, Rfl: 0 .  prazosin (MINIPRESS) 5 MG capsule, Take 1 capsule (5 mg total) by mouth at bedtime., Disp: 30 capsule, Rfl: 3 .  QUEtiapine (SEROQUEL) 300 MG tablet, Take 1 tablet (300 mg total) by mouth at bedtime., Disp: 30 tablet, Rfl: 3 .  rifaximin (XIFAXAN) 550 MG TABS tablet, Take 550 mg 2 (two) times daily by mouth., Disp: , Rfl:  .  sildenafil (REVATIO) 20 MG tablet, Take 40-60 mg as needed by mouth (for ED). No more than 3 in a day, Disp: , Rfl:  .  spironolactone (ALDACTONE) 50 MG tablet, Take 1 tablet (50 mg total) by mouth daily. (Patient taking differently: Take 50 mg by mouth 2 (two) times daily. ), Disp: 30 tablet, Rfl: 0 .  venlafaxine XR (EFFEXOR-XR) 150 MG 24 hr capsule, Take 1 capsule (150 mg total) by mouth daily with breakfast., Disp: 30 capsule, Rfl: 2 .  zaleplon (SONATA) 10 MG capsule, TAKE 1 CAPSULE BY MOUTH AT BEDTIME AS NEEDED, Disp: 30 capsule, Rfl: 5  Review of Systems  Constitutional: Negative for appetite change, chills and fever.  Respiratory: Negative for chest tightness, shortness of breath and wheezing.   Cardiovascular: Negative for chest pain and palpitations.  Gastrointestinal: Negative for abdominal pain, nausea and vomiting.    Social History   Tobacco Use  . Smoking status: Never Smoker  . Smokeless tobacco: Never Used  Substance Use Topics  . Alcohol use: No    Alcohol/week: 0.0 oz   Objective:   BP 120/70 (BP Location: Right Arm, Patient Position: Sitting, Cuff Size: Large)   Pulse 70   Temp 98.2 F (36.8 C) (Oral)   Resp 16   SpO2 98%     Physical Exam   General Appearance:    Alert, cooperative, no distress  Eyes:    PERRL, conjunctiva/corneas clear, EOM's intact       Lungs:     Clear to auscultation bilaterally, respirations unlabored  Heart:    Regular rate and rhythm  Neurologic:   Awake, alert, oriented x 3. No apparent focal neurological            defect.   Skin:  Faint erythema of cheeks and forehead with scattered flaking of skin. Few small papules.        Assessment & Plan:     1. Rosacea (suspected)  - metroNIDAZOLE (METROCREAM) 0.75 % cream; Apply topically at bedtime.  Dispense: 45 g; Refill: 3 Call for dermatology referral if not much better in 3-4 weeks.   2. Insomnia, unspecified type Intolerant to zaleplon, zolpidem, and benzodiazepines.  try- hydrOXYzine (ATARAX/VISTARIL) 10 MG tablet; Take 1-2 tablets (10-20 mg total) by mouth at bedtime.  Dispense: 30 tablet;  Refill: Buck[place], MD  [facility]

## 2018-02-23 ENCOUNTER — Other Ambulatory Visit: Payer: Self-pay | Admitting: Family Medicine

## 2018-02-23 ENCOUNTER — Other Ambulatory Visit: Payer: Self-pay | Admitting: Psychiatry

## 2018-02-23 DIAGNOSIS — M5020 Other cervical disc displacement, unspecified cervical region: Secondary | ICD-10-CM

## 2018-02-23 DIAGNOSIS — F331 Major depressive disorder, recurrent, moderate: Secondary | ICD-10-CM

## 2018-02-23 DIAGNOSIS — K219 Gastro-esophageal reflux disease without esophagitis: Secondary | ICD-10-CM

## 2018-03-03 ENCOUNTER — Ambulatory Visit (INDEPENDENT_AMBULATORY_CARE_PROVIDER_SITE_OTHER): Payer: Medicare Other | Admitting: Family Medicine

## 2018-03-03 ENCOUNTER — Encounter: Payer: Self-pay | Admitting: Family Medicine

## 2018-03-03 VITALS — BP 120/76 | HR 84 | Temp 97.7°F | Resp 20 | Wt 250.0 lb

## 2018-03-03 DIAGNOSIS — G4700 Insomnia, unspecified: Secondary | ICD-10-CM | POA: Diagnosis not present

## 2018-03-03 DIAGNOSIS — J40 Bronchitis, not specified as acute or chronic: Secondary | ICD-10-CM

## 2018-03-03 DIAGNOSIS — R05 Cough: Secondary | ICD-10-CM

## 2018-03-03 DIAGNOSIS — R059 Cough, unspecified: Secondary | ICD-10-CM

## 2018-03-03 MED ORDER — RAMELTEON 8 MG PO TABS
8.0000 mg | ORAL_TABLET | Freq: Every day | ORAL | 2 refills | Status: DC
Start: 2018-03-03 — End: 2018-05-04

## 2018-03-03 MED ORDER — DOXYCYCLINE HYCLATE 100 MG PO TABS
100.0000 mg | ORAL_TABLET | Freq: Two times a day (BID) | ORAL | 0 refills | Status: DC
Start: 2018-03-03 — End: 2018-07-15

## 2018-03-03 MED ORDER — PREDNISONE 10 MG PO TABS: ORAL_TABLET | ORAL | 0 refills | Status: AC

## 2018-03-03 NOTE — Progress Notes (Signed)
Patient: Ray Robles Male    DOB: October 21, 1961   57 y.o.   MRN: [id number] Visit Date: 03/03/2018  Today's Provider: Lelon Huh, MD   Chief Complaint  Patient presents with  . Cough    x 5-6 days   Subjective:    Cough  This is a new problem. Episode onset: 5-6 days ago. The problem has been gradually worsening. The cough is productive of sputum (white colored sputum blood tinged). Associated symptoms include chills, a fever (101 two days ago), headaches, hemoptysis, myalgias, postnasal drip, a sore throat, shortness of breath, sweats and wheezing. Pertinent negatives include no chest pain, nasal congestion or rhinorrhea. He has tried nothing for the symptoms.   He also reports he continues to have difficulty falling and slaying asleep. He has tried several medications. Had severe PTSD reaction to Ambien years ago. He is not candidate for benzodiazepines due to history of substance abuse. Has severe PTSD reaction to Belsomra. Has been taking generic Sonata and melatonin which he reports is not helping to sleep at all, and failed trial of trazodone.     Allergies  Allergen Reactions  . Ambien [Zolpidem Tartrate] Other (See Comments)    Reaction: Violent sleep  . Ibuprofen Other (See Comments)    Can't take because of liver  . Morphine Diarrhea and Nausea And Vomiting  . Morphine And Related Diarrhea and Nausea And Vomiting  . Tylenol [Acetaminophen] Other (See Comments)    Can't take because of liver  . Xanax [Alprazolam] Other (See Comments)    Brings the ammonia levels up  . Zaleplon     seizures  . Zolpidem Other (See Comments)    Reaction: Violent Sleep     Current Outpatient Medications:  .  buprenorphine-naloxone (SUBOXONE) 2-0.5 mg SUBL SL tablet, Place 1-2 tablets See admin instructions under the tongue. Take 2 tablets by mouth in the morning, take 1 tablet by mouth midday and take 2 tablets by mouth at bedtime, Disp: , Rfl:  .  diphenoxylate-atropine  (LOMOTIL) 2.5-0.025 MG per tablet, Take 2 tablets by mouth every 6 (six) hours as needed for diarrhea or loose stools. , Disp: , Rfl:  .  escitalopram (LEXAPRO) 5 MG tablet, Take 1 tablet (5 mg total) by mouth daily., Disp: 30 tablet, Rfl: 1 .  furosemide (LASIX) 40 MG tablet, Take 1 tablet (40 mg total) by mouth 2 (two) times daily. (Patient taking differently: Take 40 mg daily by mouth. ), Disp: 1 tablet, Rfl: 1 .  gabapentin (NEURONTIN) 300 MG capsule, Take 1 capsule (300 mg total) by mouth 2 (two) times daily., Disp: 60 capsule, Rfl: 12 .  hydrOXYzine (ATARAX/VISTARIL) 10 MG tablet, Take 1-2 tablets (10-20 mg total) by mouth at bedtime., Disp: 30 tablet, Rfl: 5 .  hyoscyamine (ANASPAZ) 0.125 MG TBDP disintergrating tablet, Place 0.125 mg every 4 (four) hours as needed under the tongue for bladder spasms or cramping., Disp: , Rfl:  .  lactulose (CHRONULAC) 10 GM/15ML solution, TAKE 30 MLS BY MOUTH 3 TIMES DAILY (Patient taking differently: Take 20 gm by mouth daily), Disp: 240 mL, Rfl: 12 .  levothyroxine (SYNTHROID, LEVOTHROID) 75 MCG tablet, TAKE 1 TABLET BY MOUTH ONCE DAILY (Patient taking differently: TAKE 75 MCG BY MOUTH ONCE DAILY), Disp: 30 tablet, Rfl: 11 .  Melatonin 10 MG CAPS, Take 10 mg at bedtime by mouth., Disp: , Rfl:  .  metoprolol tartrate (LOPRESSOR) 25 MG tablet, TAKE 1 TABLET BY MOUTH TWICE DAILY (  Patient taking differently: TAKE 25 MG BY MOUTH TWICE DAILY), Disp: 30 tablet, Rfl: 12 .  metroNIDAZOLE (METROCREAM) 0.75 % cream, Apply topically at bedtime., Disp: 45 g, Rfl: 3 .  nitroGLYCERIN (NITROSTAT) 0.4 MG SL tablet, Place 0.4 mg under the tongue every 5 (five) minutes as needed for chest pain., Disp: , Rfl:  .  nystatin cream (MYCOSTATIN), Apply 1 application 3 (three) times daily as needed topically (for rash). , Disp: , Rfl:  .  omeprazole (PRILOSEC) 20 MG capsule, TAKE 2 CAPSULES BY MOUTH ONCE DAILY, Disp: 60 capsule, Rfl: 12 .  ondansetron (ZOFRAN-ODT) 4 MG disintegrating  tablet, Take 4 mg by mouth every 8 (eight) hours as needed for nausea or vomiting., Disp: , Rfl:  .  oxyCODONE (ROXICODONE) 5 MG immediate release tablet, 1-2 tablets each 6 hours as needed for pain, Disp: 20 tablet, Rfl: 0 .  prazosin (MINIPRESS) 5 MG capsule, Take 1 capsule (5 mg total) by mouth at bedtime., Disp: 30 capsule, Rfl: 3 .  QUEtiapine (SEROQUEL) 300 MG tablet, Take 1 tablet (300 mg total) by mouth at bedtime., Disp: 30 tablet, Rfl: 3 .  rifaximin (XIFAXAN) 550 MG TABS tablet, Take 550 mg 2 (two) times daily by mouth., Disp: , Rfl:  .  sildenafil (REVATIO) 20 MG tablet, Take 40-60 mg as needed by mouth (for ED). No more than 3 in a day, Disp: , Rfl:  .  spironolactone (ALDACTONE) 50 MG tablet, Take 1 tablet (50 mg total) by mouth daily. (Patient taking differently: Take 50 mg by mouth 2 (two) times daily. ), Disp: 30 tablet, Rfl: 0 .  venlafaxine XR (EFFEXOR-XR) 150 MG 24 hr capsule, Take 1 capsule (150 mg total) by mouth daily with breakfast., Disp: 30 capsule, Rfl: 2 .  zaleplon (SONATA) 10 MG capsule, TAKE 1 CAPSULE BY MOUTH AT BEDTIME AS NEEDED, Disp: 30 capsule, Rfl: 5  Review of Systems  Constitutional: Positive for chills and fever (101 two days ago). Negative for appetite change.  HENT: Positive for congestion, postnasal drip and sore throat. Negative for rhinorrhea, sinus pressure, sinus pain and sneezing.   Eyes: Negative for pain, discharge and itching.  Respiratory: Positive for cough, hemoptysis, shortness of breath and wheezing. Negative for chest tightness.   Cardiovascular: Negative for chest pain and palpitations.  Gastrointestinal: Negative for abdominal pain, nausea and vomiting.  Musculoskeletal: Positive for myalgias.  Neurological: Positive for headaches.    Social History   Tobacco Use  . Smoking status: Never Smoker  . Smokeless tobacco: Never Used  Substance Use Topics  . Alcohol use: No    Alcohol/week: 0.0 oz   Objective:   BP 120/76 (BP  Location: Left Arm, Patient Position: Sitting, Cuff Size: Large)   Pulse 84   Temp 97.7 F (36.5 C) (Oral)   Resp 20   Wt 250 lb (113.4 kg)   SpO2 96% Comment: room air  BMI 34.38 kg/m  There were no vitals filed for this visit.   Physical Exam  General Appearance:    Alert, cooperative, no distress  HENT:   ENT exam normal, no neck nodes or sinus tenderness  Eyes:    PERRL, conjunctiva/corneas clear, EOM's intact       Lungs:    Occasional expiratory wheeze, no rhonchi, respirations unlabored  Heart:    Regular rate and rhythm  Neurologic:   Awake, alert, oriented x 3. No apparent focal neurological           defect.  Assessment & Plan:     1. Bronchitis  - doxycycline (VIBRA-TABS) 100 MG tablet; Take 1 tablet (100 mg total) by mouth 2 (two) times daily.  Dispense: 20 tablet; Refill: 0  2. Cough  - predniSONE (DELTASONE) 10 MG tablet; 6 tablets for 1 day, then 5 for 1 day, then 4 for 1 day, then 3 for 1 day, then 2 for 1 day then 1 for 1 day.  Dispense: 21 tablet; Refill: 0  3. Insomnia, unspecified type  - ramelteon (ROZEREM) 8 MG tablet; Take 1 tablet (8 mg total) by mouth at bedtime.  Dispense: 30 tablet; Refill: 2       Lelon Huh, MD  [facility]

## 2018-03-05 NOTE — Telephone Encounter (Signed)
complete

## 2018-03-06 ENCOUNTER — Telehealth: Payer: Self-pay | Admitting: Family Medicine

## 2018-03-06 DIAGNOSIS — L719 Rosacea, unspecified: Secondary | ICD-10-CM

## 2018-03-06 NOTE — Telephone Encounter (Signed)
Please advise 

## 2018-03-06 NOTE — Telephone Encounter (Signed)
Patients wife called stating that you gave patient metroNIDAZOLE (METROCREAM) 0.75 % cream for a rash on his face and you told them if it did not help to call back and you would refer him to dermatology.  They prefer [facility], Dr. Evorn Gong.

## 2018-03-20 ENCOUNTER — Other Ambulatory Visit: Payer: Self-pay | Admitting: Psychiatry

## 2018-03-20 DIAGNOSIS — F331 Major depressive disorder, recurrent, moderate: Secondary | ICD-10-CM

## 2018-03-23 DIAGNOSIS — K7460 Unspecified cirrhosis of liver: Secondary | ICD-10-CM | POA: Diagnosis not present

## 2018-04-01 ENCOUNTER — Ambulatory Visit (INDEPENDENT_AMBULATORY_CARE_PROVIDER_SITE_OTHER): Payer: Medicare Other | Admitting: Family Medicine

## 2018-04-01 ENCOUNTER — Encounter: Payer: Self-pay | Admitting: Family Medicine

## 2018-04-01 VITALS — BP 138/72 | HR 94 | Temp 98.2°F | Resp 18 | Wt 248.0 lb

## 2018-04-01 DIAGNOSIS — G4710 Hypersomnia, unspecified: Secondary | ICD-10-CM | POA: Diagnosis not present

## 2018-04-01 DIAGNOSIS — H40013 Open angle with borderline findings, low risk, bilateral: Secondary | ICD-10-CM | POA: Diagnosis not present

## 2018-04-01 DIAGNOSIS — E089 Diabetes mellitus due to underlying condition without complications: Secondary | ICD-10-CM | POA: Diagnosis not present

## 2018-04-01 DIAGNOSIS — D3131 Benign neoplasm of right choroid: Secondary | ICD-10-CM | POA: Diagnosis not present

## 2018-04-01 DIAGNOSIS — H524 Presbyopia: Secondary | ICD-10-CM | POA: Diagnosis not present

## 2018-04-01 DIAGNOSIS — E119 Type 2 diabetes mellitus without complications: Secondary | ICD-10-CM | POA: Diagnosis not present

## 2018-04-01 NOTE — Progress Notes (Signed)
Patient: Ray Robles Male    DOB: [birth year]   57 y.o.   MRN: [id number] Visit Date: 04/01/2018  Today's Provider: Lelon Huh, MD   Chief Complaint  Patient presents with  . Sleeping Problem   Subjective:    Insomnia  Primary symptoms: sleep disturbance, premature morning awakening, malaise/fatigue.  The current episode started more than one year. The problem is unchanged (Patient was told by his hepatologist to follow up with his PCP to check for sleep apnea).    Patient is here to discuss having a sleep study done. He states he has very disruptive sleep, wakes frequently, often with vivid or violent dreams. Feels sleepy and tired during the day, does not feel well rested. Does snore loudly but no obvious apnic spells when observe by his wife.   Allergies  Allergen Reactions  . Ambien [Zolpidem Tartrate] Other (See Comments)    Reaction: Violent sleep  . Ibuprofen Other (See Comments)    Can't take because of liver  . Morphine Diarrhea and Nausea And Vomiting  . Morphine And Related Diarrhea and Nausea And Vomiting  . Tylenol [Acetaminophen] Other (See Comments)    Can't take because of liver  . Xanax [Alprazolam] Other (See Comments)    Brings the ammonia levels up  . Zaleplon     seizures  . Zolpidem Other (See Comments)    Reaction: Violent Sleep     Current Outpatient Medications:  .  buprenorphine-naloxone (SUBOXONE) 2-0.5 mg SUBL SL tablet, Place 1-2 tablets See admin instructions under the tongue. Take 2 tablets by mouth in the morning, take 1 tablet by mouth midday and take 2 tablets by mouth at bedtime, Disp: , Rfl:  .  diphenoxylate-atropine (LOMOTIL) 2.5-0.025 MG per tablet, Take 2 tablets by mouth every 6 (six) hours as needed for diarrhea or loose stools. , Disp: , Rfl:  .  doxycycline (VIBRA-TABS) 100 MG tablet, Take 1 tablet (100 mg total) by mouth 2 (two) times daily., Disp: 20 tablet, Rfl: 0 .  escitalopram (LEXAPRO) 5 MG tablet, Take 1 tablet  (5 mg total) by mouth daily., Disp: 30 tablet, Rfl: 1 .  furosemide (LASIX) 40 MG tablet, Take 1 tablet (40 mg total) by mouth 2 (two) times daily. (Patient taking differently: Take 40 mg daily by mouth. ), Disp: 1 tablet, Rfl: 1 .  gabapentin (NEURONTIN) 300 MG capsule, Take 1 capsule (300 mg total) by mouth 2 (two) times daily., Disp: 60 capsule, Rfl: 12 .  hydrOXYzine (ATARAX/VISTARIL) 10 MG tablet, Take 1-2 tablets (10-20 mg total) by mouth at bedtime., Disp: 30 tablet, Rfl: 5 .  hyoscyamine (ANASPAZ) 0.125 MG TBDP disintergrating tablet, Place 0.125 mg every 4 (four) hours as needed under the tongue for bladder spasms or cramping., Disp: , Rfl:  .  lactulose (CHRONULAC) 10 GM/15ML solution, TAKE 30 MLS BY MOUTH 3 TIMES DAILY (Patient taking differently: Take 20 gm by mouth daily), Disp: 240 mL, Rfl: 12 .  levothyroxine (SYNTHROID, LEVOTHROID) 75 MCG tablet, TAKE 1 TABLET BY MOUTH ONCE DAILY (Patient taking differently: TAKE 75 MCG BY MOUTH ONCE DAILY), Disp: 30 tablet, Rfl: 11 .  Melatonin 10 MG CAPS, Take 10 mg at bedtime by mouth., Disp: , Rfl:  .  metoprolol tartrate (LOPRESSOR) 25 MG tablet, TAKE 1 TABLET BY MOUTH TWICE DAILY (Patient taking differently: TAKE 25 MG BY MOUTH TWICE DAILY), Disp: 30 tablet, Rfl: 12 .  metroNIDAZOLE (METROCREAM) 0.75 % cream, Apply topically at bedtime., Disp:  45 g, Rfl: 3 .  nitroGLYCERIN (NITROSTAT) 0.4 MG SL tablet, Place 0.4 mg under the tongue every 5 (five) minutes as needed for chest pain., Disp: , Rfl:  .  nystatin cream (MYCOSTATIN), Apply 1 application 3 (three) times daily as needed topically (for rash). , Disp: , Rfl:  .  omeprazole (PRILOSEC) 20 MG capsule, TAKE 2 CAPSULES BY MOUTH ONCE DAILY, Disp: 60 capsule, Rfl: 12 .  ondansetron (ZOFRAN-ODT) 4 MG disintegrating tablet, Take 4 mg by mouth every 8 (eight) hours as needed for nausea or vomiting., Disp: , Rfl:  .  oxyCODONE (ROXICODONE) 5 MG immediate release tablet, 1-2 tablets each 6 hours as  needed for pain, Disp: 20 tablet, Rfl: 0 .  prazosin (MINIPRESS) 5 MG capsule, Take 1 capsule (5 mg total) by mouth at bedtime., Disp: 30 capsule, Rfl: 3 .  QUEtiapine (SEROQUEL) 300 MG tablet, Take 1 tablet (300 mg total) by mouth at bedtime., Disp: 30 tablet, Rfl: 3 .  ramelteon (ROZEREM) 8 MG tablet, Take 1 tablet (8 mg total) by mouth at bedtime., Disp: 30 tablet, Rfl: 2 .  rifaximin (XIFAXAN) 550 MG TABS tablet, Take 550 mg 2 (two) times daily by mouth., Disp: , Rfl:  .  sildenafil (REVATIO) 20 MG tablet, Take 40-60 mg as needed by mouth (for ED). No more than 3 in a day, Disp: , Rfl:  .  spironolactone (ALDACTONE) 50 MG tablet, Take 1 tablet (50 mg total) by mouth daily. (Patient taking differently: Take 50 mg by mouth 2 (two) times daily. ), Disp: 30 tablet, Rfl: 0 .  venlafaxine XR (EFFEXOR-XR) 150 MG 24 hr capsule, Take 1 capsule (150 mg total) by mouth daily with breakfast., Disp: 30 capsule, Rfl: 2 .  zaleplon (SONATA) 10 MG capsule, TAKE 1 CAPSULE BY MOUTH AT BEDTIME AS NEEDED, Disp: 30 capsule, Rfl: 5  Review of Systems  Constitutional: Positive for fatigue and malaise/fatigue. Negative for appetite change, chills and fever.  Respiratory: Negative for chest tightness, shortness of breath and wheezing.   Cardiovascular: Negative for chest pain and palpitations.  Gastrointestinal: Negative for abdominal pain, nausea and vomiting.  Psychiatric/Behavioral: Positive for sleep disturbance. The patient has insomnia.        Trouble staying asleep, nightmares     Social History   Tobacco Use  . Smoking status: Never Smoker  . Smokeless tobacco: Never Used  Substance Use Topics  . Alcohol use: No    Alcohol/week: 0.0 oz   Objective:   BP 138/72 (BP Location: Left Arm, Patient Position: Sitting, Cuff Size: Large)   Pulse 94   Temp 98.2 F (36.8 C)   Resp 18   Wt 248 lb (112.5 kg)   SpO2 96% Comment: room air  BMI 34.11 kg/m     Physical Exam     General Appearance:     Alert, cooperative, no distress, obese.   Eyes:    PERRL, conjunctiva/corneas clear, EOM's intact       Lungs:     Clear to auscultation bilaterally, respirations unlabored  Heart:    Regular rate and rhythm  Neurologic:   Awake, alert, oriented x 3. No apparent focal neurological           defect.            Assessment & Plan:     1. Excessive sleepiness  - Home sleep test       Lelon Huh, MD  [facility]

## 2018-04-07 ENCOUNTER — Telehealth: Payer: Self-pay | Admitting: Family Medicine

## 2018-04-07 NOTE — Telephone Encounter (Signed)
Order for home sleep study faxed to [facility] °

## 2018-04-14 ENCOUNTER — Encounter: Payer: Self-pay | Admitting: Family Medicine

## 2018-04-14 DIAGNOSIS — G4733 Obstructive sleep apnea (adult) (pediatric): Secondary | ICD-10-CM | POA: Diagnosis not present

## 2018-04-14 DIAGNOSIS — R0602 Shortness of breath: Secondary | ICD-10-CM | POA: Diagnosis not present

## 2018-04-14 LAB — PULMONARY FUNCTION TEST

## 2018-04-14 LAB — HM DIABETES EYE EXAM

## 2018-04-15 DIAGNOSIS — R0602 Shortness of breath: Secondary | ICD-10-CM | POA: Diagnosis not present

## 2018-04-15 DIAGNOSIS — G4733 Obstructive sleep apnea (adult) (pediatric): Secondary | ICD-10-CM | POA: Diagnosis not present

## 2018-04-17 ENCOUNTER — Encounter: Payer: Self-pay | Admitting: *Deleted

## 2018-04-22 ENCOUNTER — Telehealth: Payer: Self-pay | Admitting: Family Medicine

## 2018-04-22 NOTE — Telephone Encounter (Signed)
Pt refused referral to dermatology for rosacea stating that it seems better at present time.Just an [place]

## 2018-04-24 ENCOUNTER — Telehealth: Payer: Self-pay | Admitting: Family Medicine

## 2018-04-24 NOTE — Telephone Encounter (Signed)
Please advise patient sleepy study shows he has severe sleep apnea and needs CPAP titration. Have sent order to [facility] to schedule.

## 2018-04-24 NOTE — Telephone Encounter (Signed)
LMOVM for pt to return call 

## 2018-04-28 NOTE — Telephone Encounter (Signed)
Tried calling patient on all numbers listed in his chart. Left messages to call us back.

## 2018-04-29 NOTE — Telephone Encounter (Signed)
LMOVM for Ray Robles to return call.

## 2018-04-29 NOTE — Telephone Encounter (Signed)
Tried to send call several times back to [facility] [employer] and [place].  I spoke to patient's wife and told her someone would call her.  Ray Robles

## 2018-04-29 NOTE — Telephone Encounter (Signed)
Patient was notified of results. Expressed understanding.  

## 2018-04-29 NOTE — Telephone Encounter (Signed)
LMOVM for Tye [place] to return call.

## 2018-05-01 ENCOUNTER — Telehealth: Payer: Self-pay

## 2018-05-01 DIAGNOSIS — F331 Major depressive disorder, recurrent, moderate: Secondary | ICD-10-CM

## 2018-05-01 MED ORDER — ESCITALOPRAM OXALATE 5 MG PO TABS: 5.0000 mg | ORAL_TABLET | Freq: Every day | ORAL | 0 refills | Status: DC

## 2018-05-01 MED ORDER — VENLAFAXINE HCL ER 150 MG PO CP24: 150.0000 mg | ORAL_CAPSULE | Freq: Every day | ORAL | 0 refills | Status: DC

## 2018-05-01 NOTE — Telephone Encounter (Signed)
Per request - Sent lexapro and effexor to pharmacy for 1 week supply. Pt will see Dr.Faheem on Monday.

## 2018-05-01 NOTE — Telephone Encounter (Signed)
pt wife called asked if enough medication can be called in to due until pt is seen on monday by dr. Gretel Acre. pt needs lexapro and effexor,  escitalopram (LEXAPRO) 5 MG tablet  Medication  Date: 01/26/2018 Department: [facility] Ordering/Authorizing: Rainey Pines, MD  Order Providers   Prescribing Provider Encounter Provider  Rainey Pines, MD Rainey Pines, MD  Outpatient Medication Detail    Disp Refills Start End   escitalopram (LEXAPRO) 5 MG tablet 30 tablet 1 01/26/2018    Sig - Route: Take 1 tablet (5 mg total) by mouth daily. - Oral   Sent to pharmacy as: escitalopram (LEXAPRO) 5 MG tablet   E-Prescribing Status: Receipt confirmed by pharmacy (01/26/2018 12:39 PM EST)    venlafaxine XR (EFFEXOR-XR) 150 MG 24 hr capsule  Medication  Date: 01/26/2018 Department: [facility] Ordering/Authorizing: Rainey Pines, MD  Order Providers   Prescribing Provider Encounter Provider  Rainey Pines, MD Rainey Pines, MD  Outpatient Medication Detail    Disp Refills Start End   venlafaxine XR (EFFEXOR-XR) 150 MG 24 hr capsule 30 capsule 2 01/26/2018    Sig - Route: Take 1 capsule (150 mg total) by mouth daily with breakfast. - Oral   Sent to pharmacy as: venlafaxine XR (EFFEXOR-XR) 150 MG 24 hr capsule   E-Prescribing Status: Receipt confirmed by pharmacy (01/26/2018 12:39 PM EST)

## 2018-05-04 ENCOUNTER — Encounter: Payer: Self-pay | Admitting: Psychiatry

## 2018-05-04 ENCOUNTER — Other Ambulatory Visit: Payer: Self-pay

## 2018-05-04 ENCOUNTER — Other Ambulatory Visit: Payer: Self-pay | Admitting: Family Medicine

## 2018-05-04 ENCOUNTER — Ambulatory Visit (INDEPENDENT_AMBULATORY_CARE_PROVIDER_SITE_OTHER): Payer: Medicare Other | Admitting: Psychiatry

## 2018-05-04 ENCOUNTER — Telehealth: Payer: Self-pay

## 2018-05-04 VITALS — BP 137/83 | HR 84 | Temp 97.4°F | Wt 245.6 lb

## 2018-05-04 DIAGNOSIS — G4700 Insomnia, unspecified: Secondary | ICD-10-CM

## 2018-05-04 DIAGNOSIS — Z9989 Dependence on other enabling machines and devices: Secondary | ICD-10-CM

## 2018-05-04 DIAGNOSIS — G4733 Obstructive sleep apnea (adult) (pediatric): Secondary | ICD-10-CM

## 2018-05-04 DIAGNOSIS — F331 Major depressive disorder, recurrent, moderate: Secondary | ICD-10-CM | POA: Diagnosis not present

## 2018-05-04 MED ORDER — PRAZOSIN HCL 5 MG PO CAPS: 5.0000 mg | ORAL_CAPSULE | Freq: Every day | ORAL | 3 refills | Status: DC

## 2018-05-04 MED ORDER — VENLAFAXINE HCL ER 150 MG PO CP24: 150.0000 mg | ORAL_CAPSULE | Freq: Every day | ORAL | 3 refills | Status: DC

## 2018-05-04 MED ORDER — QUETIAPINE FUMARATE 300 MG PO TABS: 300.0000 mg | ORAL_TABLET | Freq: Every day | ORAL | 3 refills | Status: DC

## 2018-05-04 MED ORDER — ESCITALOPRAM OXALATE 5 MG PO TABS: 5.0000 mg | ORAL_TABLET | Freq: Every day | ORAL | 3 refills | Status: DC
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
## 2018-05-04 NOTE — Telephone Encounter (Signed)
ok 

## 2018-05-04 NOTE — Telephone Encounter (Signed)
pt states he [facility] [address] [place]. can you resend rx to [facility]

## 2018-06-09 ENCOUNTER — Other Ambulatory Visit: Payer: Self-pay | Admitting: Family Medicine

## 2018-06-09 ENCOUNTER — Ambulatory Visit: Payer: Self-pay | Admitting: Family Medicine

## 2018-06-09 DIAGNOSIS — L719 Rosacea, unspecified: Secondary | ICD-10-CM

## 2018-06-10 ENCOUNTER — Encounter: Payer: Self-pay | Admitting: Family Medicine

## 2018-06-10 ENCOUNTER — Ambulatory Visit (INDEPENDENT_AMBULATORY_CARE_PROVIDER_SITE_OTHER): Payer: Medicare Other | Admitting: Family Medicine

## 2018-06-10 VITALS — BP 140/68 | HR 87 | Temp 98.0°F | Resp 20 | Wt 251.0 lb

## 2018-06-10 DIAGNOSIS — G4733 Obstructive sleep apnea (adult) (pediatric): Secondary | ICD-10-CM | POA: Diagnosis not present

## 2018-06-10 DIAGNOSIS — R5383 Other fatigue: Secondary | ICD-10-CM | POA: Diagnosis not present

## 2018-06-10 DIAGNOSIS — Z125 Encounter for screening for malignant neoplasm of prostate: Secondary | ICD-10-CM | POA: Diagnosis not present

## 2018-06-10 DIAGNOSIS — I10 Essential (primary) hypertension: Secondary | ICD-10-CM | POA: Diagnosis not present

## 2018-06-10 DIAGNOSIS — E039 Hypothyroidism, unspecified: Secondary | ICD-10-CM

## 2018-06-10 MED ORDER — TAMSULOSIN HCL 0.4 MG PO CAPS: 0.4000 mg | ORAL_CAPSULE | Freq: Every day | ORAL | 3 refills | Status: DC

## 2018-06-10 NOTE — Progress Notes (Signed)
Patient: Ray Robles Male    DOB: Apr 12, 1961   57 y.o.   MRN: [id number] Visit Date: 06/10/2018  Today's Provider: Lelon Huh, MD   Chief Complaint  Patient presents with  . Sleep Apnea   Subjective:    HPI Sleep Apnea: Patient underwent a sleep study on 04/14/2018 which showed severe sleep apne that required a CPAP. Patient has been using the CPAP for 1 month now and comes in today for a follow up. He reports that since using the CPAP, he still feels lethargic. He also states wearing the mask is uncomfortable. He states he wakes up at night and CPAP is at very high pressure and leaking out the sides.     Allergies  Allergen Reactions  . Ambien [Zolpidem Tartrate] Other (See Comments)    Reaction: Violent sleep  . Ibuprofen Other (See Comments)    Can't take because of liver  . Morphine Diarrhea and Nausea And Vomiting  . Morphine And Related Diarrhea and Nausea And Vomiting  . Tylenol [Acetaminophen] Other (See Comments)    Can't take because of liver  . Xanax [Alprazolam] Other (See Comments)    Brings the ammonia levels up  . Zaleplon     seizures  . Zolpidem Other (See Comments)    Reaction: Violent Sleep     Current Outpatient Medications:  .  buprenorphine-naloxone (SUBOXONE) 2-0.5 mg SUBL SL tablet, Place 1-2 tablets See admin instructions under the tongue. Take 2 tablets by mouth in the morning, take 1 tablet by mouth midday and take 2 tablets by mouth at bedtime, Disp: , Rfl:  .  diphenoxylate-atropine (LOMOTIL) 2.5-0.025 MG per tablet, Take 2 tablets by mouth every 6 (six) hours as needed for diarrhea or loose stools. , Disp: , Rfl:  .  doxycycline (VIBRA-TABS) 100 MG tablet, Take 1 tablet (100 mg total) by mouth 2 (two) times daily., Disp: 20 tablet, Rfl: 0 .  escitalopram (LEXAPRO) 5 MG tablet, Take 1 tablet (5 mg total) by mouth daily., Disp: 30 tablet, Rfl: 3 .  furosemide (LASIX) 40 MG tablet, Take 1 tablet (40 mg total) by mouth 2 (two) times  daily. (Patient taking differently: Take 40 mg daily by mouth. ), Disp: 1 tablet, Rfl: 1 .  gabapentin (NEURONTIN) 300 MG capsule, Take 1 capsule (300 mg total) by mouth 2 (two) times daily., Disp: 60 capsule, Rfl: 12 .  hydrOXYzine (ATARAX/VISTARIL) 10 MG tablet, Take 1-2 tablets (10-20 mg total) by mouth at bedtime., Disp: 30 tablet, Rfl: 5 .  hyoscyamine (ANASPAZ) 0.125 MG TBDP disintergrating tablet, Place 0.125 mg every 4 (four) hours as needed under the tongue for bladder spasms or cramping., Disp: , Rfl:  .  lactulose (CHRONULAC) 10 GM/15ML solution, TAKE 30 MLS BY MOUTH 3 TIMES DAILY (Patient taking differently: Take 20 gm by mouth daily), Disp: 240 mL, Rfl: 12 .  levothyroxine (SYNTHROID, LEVOTHROID) 75 MCG tablet, TAKE 1 TABLET BY MOUTH ONCE DAILY (Patient taking differently: TAKE 75 MCG BY MOUTH ONCE DAILY), Disp: 30 tablet, Rfl: 11 .  Melatonin 10 MG CAPS, Take 10 mg at bedtime by mouth., Disp: , Rfl:  .  metoprolol tartrate (LOPRESSOR) 25 MG tablet, TAKE 1 TABLET BY MOUTH TWICE DAILY (Patient taking differently: TAKE 25 MG BY MOUTH TWICE DAILY), Disp: 30 tablet, Rfl: 12 .  metroNIDAZOLE (METROCREAM) 0.75 % cream, APPLY TOPICALLY AT BEDTIME, Disp: 45 g, Rfl: 3 .  nitroGLYCERIN (NITROSTAT) 0.4 MG SL tablet, Place 0.4 mg under  the tongue every 5 (five) minutes as needed for chest pain., Disp: , Rfl:  .  nystatin cream (MYCOSTATIN), Apply 1 application 3 (three) times daily as needed topically (for rash). , Disp: , Rfl:  .  omeprazole (PRILOSEC) 20 MG capsule, TAKE 2 CAPSULES BY MOUTH ONCE DAILY, Disp: 60 capsule, Rfl: 12 .  ondansetron (ZOFRAN-ODT) 4 MG disintegrating tablet, Take 4 mg by mouth every 8 (eight) hours as needed for nausea or vomiting., Disp: , Rfl:  .  oxyCODONE (ROXICODONE) 5 MG immediate release tablet, 1-2 tablets each 6 hours as needed for pain, Disp: 20 tablet, Rfl: 0 .  prazosin (MINIPRESS) 5 MG capsule, Take 1 capsule (5 mg total) by mouth at bedtime., Disp: 30 capsule,  Rfl: 3 .  QUEtiapine (SEROQUEL) 300 MG tablet, Take 1 tablet (300 mg total) by mouth at bedtime., Disp: 30 tablet, Rfl: 3 .  rifaximin (XIFAXAN) 550 MG TABS tablet, Take 550 mg 2 (two) times daily by mouth., Disp: , Rfl:  .  ROZEREM 8 MG tablet, TAKE 1 TABLET BY MOUTH AT BEDTIME., Disp: 30 tablet, Rfl: 2 .  sildenafil (REVATIO) 20 MG tablet, Take 40-60 mg as needed by mouth (for ED). No more than 3 in a day, Disp: , Rfl:  .  spironolactone (ALDACTONE) 50 MG tablet, Take 1 tablet (50 mg total) by mouth daily. (Patient taking differently: Take 50 mg by mouth 2 (two) times daily. ), Disp: 30 tablet, Rfl: 0 .  venlafaxine XR (EFFEXOR-XR) 150 MG 24 hr capsule, Take 1 capsule (150 mg total) by mouth daily with breakfast., Disp: 30 capsule, Rfl: 3 .  zaleplon (SONATA) 10 MG capsule, TAKE 1 CAPSULE BY MOUTH AT BEDTIME AS NEEDED, Disp: 30 capsule, Rfl: 5  Review of Systems  Constitutional: Positive for fatigue. Negative for appetite change, chills and fever.  Respiratory: Negative for chest tightness, shortness of breath and wheezing.   Cardiovascular: Negative for chest pain and palpitations.  Gastrointestinal: Negative for abdominal pain, nausea and vomiting.    Social History   Tobacco Use  . Smoking status: Never Smoker  . Smokeless tobacco: Never Used  Substance Use Topics  . Alcohol use: No    Alcohol/week: 0.0 oz   Objective:   BP 140/68 (BP Location: Left Arm, Patient Position: Sitting, Cuff Size: Large)   Pulse 87   Temp 98 F (36.7 C) (Oral)   Resp 20   Wt 251 lb (113.9 kg)   SpO2 97% Comment: room air  BMI 34.52 kg/m  Vitals:   06/10/18 1417  BP: 140/68  Pulse: 87  Resp: 20  Temp: 98 F (36.7 C)  TempSrc: Oral  SpO2: 97%  Weight: 251 lb (113.9 kg)     Physical Exam   General Appearance:    Alert, cooperative, no distress  Eyes:    PERRL, conjunctiva/corneas clear, EOM's intact       Lungs:     Clear to auscultation bilaterally, respirations unlabored  Heart:     Regular rate and rhythm  Neurologic:   Awake, alert, oriented x 3. No apparent focal neurological           defect.          Assessment & Plan:     1. Essential hypertension Well controlled.  Continue current medications.   - Comprehensive metabolic panel - Lipid panel  2. Obstrictive Sleep apnea, unspecified type Not tolerating CPA. . May require BiPap. Will set up with pulmonary to discuss alternatives.   3.  Hypothyroidism, unspecified type  - TSH  4. Prostate cancer screening  - PSA  5. Other fatigue  - CBC       Lelon Huh, MD  [facility]

## 2018-06-11 LAB — COMPREHENSIVE METABOLIC PANEL
A/G RATIO: 1.3 (ref 1.2–2.2)
ALBUMIN: 4.3 g/dL (ref 3.5–5.5)
ALT: 21 IU/L (ref 0–44)
AST: 29 IU/L (ref 0–40)
Alkaline Phosphatase: 153 IU/L — ABNORMAL HIGH (ref 39–117)
BILIRUBIN TOTAL: 0.4 mg/dL (ref 0.0–1.2)
BUN / CREAT RATIO: 12 (ref 9–20)
BUN: 13 mg/dL (ref 6–24)
CHLORIDE: 100 mmol/L (ref 96–106)
CO2: 24 mmol/L (ref 20–29)
Calcium: 9.3 mg/dL (ref 8.7–10.2)
Creatinine, Ser: 1.06 mg/dL (ref 0.76–1.27)
GFR calc non Af Amer: 77 mL/min/{1.73_m2} (ref >59)
GFR, EST AFRICAN AMERICAN: 89 mL/min/{1.73_m2} (ref >59)
GLUCOSE: 117 mg/dL — AB (ref 65–99)
Globulin, Total: 3.4 g/dL (ref 1.5–4.5)
POTASSIUM: 4.3 mmol/L (ref 3.5–5.2)
Sodium: 141 mmol/L (ref 134–144)
Total Protein: 7.7 g/dL (ref 6.0–8.5)

## 2018-06-11 LAB — CBC
HEMATOCRIT: 38.5 % (ref 37.5–51.0)
HEMOGLOBIN: 12.6 g/dL — AB (ref 13.0–17.7)
MCH: 28.8 pg (ref 26.6–33.0)
MCHC: 32.7 g/dL (ref 31.5–35.7)
MCV: 88 fL (ref 79–97)
Platelets: 111 10*3/uL — ABNORMAL LOW (ref 150–450)
RBC: 4.37 x10E6/uL (ref 4.14–5.80)
RDW: 13.9 % (ref 12.3–15.4)
WBC: 3.4 10*3/uL (ref 3.4–10.8)

## 2018-06-11 LAB — LIPID PANEL
CHOLESTEROL TOTAL: 234 mg/dL — AB (ref 100–199)
Chol/HDL Ratio: 7.3 ratio — ABNORMAL HIGH (ref 0.0–5.0)
HDL: 32 mg/dL — ABNORMAL LOW (ref >39)
LDL Calculated: 151 mg/dL — ABNORMAL HIGH (ref 0–99)
Triglycerides: 253 mg/dL — ABNORMAL HIGH (ref 0–149)
VLDL Cholesterol Cal: 51 mg/dL — ABNORMAL HIGH (ref 5–40)

## 2018-06-11 LAB — PSA: PROSTATE SPECIFIC AG, SERUM: 0.1 ng/mL (ref 0.0–4.0)

## 2018-06-11 LAB — TSH: TSH: 3.03 u[IU]/mL (ref 0.450–4.500)

## 2018-06-12 ENCOUNTER — Telehealth: Payer: Self-pay

## 2018-06-12 DIAGNOSIS — E785 Hyperlipidemia, unspecified: Secondary | ICD-10-CM

## 2018-06-12 MED ORDER — EZETIMIBE 10 MG PO TABS: 10.0000 mg | ORAL_TABLET | Freq: Every day | ORAL | 2 refills | Status: DC

## 2018-06-12 MED ORDER — TAMSULOSIN HCL 0.4 MG PO CAPS: 0.4000 mg | ORAL_CAPSULE | Freq: Every evening | ORAL | 2 refills | Status: DC

## 2018-06-12 NOTE — Telephone Encounter (Signed)
Patient advised and verbally agrees with treatment plan. Prescription sent into pharmacy. Follow up appointment scheduled 08/17/2018 at 1:40pm. Patient was advised to be fasting.

## 2018-06-12 NOTE — Telephone Encounter (Signed)
-----   Message from Birdie Sons, MD sent at 06/12/2018  4:27 PM EDT ----- Cholesterol is high, PSA (prostate blood test) is normal. Recommend trial for tamsulosin 0.4mg  once every evening, #30, rf x 2 to help fully empty bladder and keep from having to get up overnight to urinate.  For cholesterol, start ezetemibe 10mg  once a day, #30, rf x 2.  Follow up 2 months. Come fasting so we change labs at follow up.

## 2018-06-12 NOTE — Telephone Encounter (Signed)
Tried calling patient. Left message to call back. 

## 2018-06-30 DIAGNOSIS — Z885 Allergy status to narcotic agent status: Secondary | ICD-10-CM | POA: Diagnosis not present

## 2018-06-30 DIAGNOSIS — K219 Gastro-esophageal reflux disease without esophagitis: Secondary | ICD-10-CM | POA: Diagnosis not present

## 2018-06-30 DIAGNOSIS — K7460 Unspecified cirrhosis of liver: Secondary | ICD-10-CM | POA: Diagnosis not present

## 2018-06-30 DIAGNOSIS — I10 Essential (primary) hypertension: Secondary | ICD-10-CM | POA: Diagnosis not present

## 2018-06-30 DIAGNOSIS — K449 Diaphragmatic hernia without obstruction or gangrene: Secondary | ICD-10-CM | POA: Diagnosis not present

## 2018-06-30 DIAGNOSIS — Z886 Allergy status to analgesic agent status: Secondary | ICD-10-CM | POA: Diagnosis not present

## 2018-07-05 ENCOUNTER — Other Ambulatory Visit: Payer: Self-pay | Admitting: Family Medicine

## 2018-07-05 DIAGNOSIS — G4700 Insomnia, unspecified: Secondary | ICD-10-CM

## 2018-07-15 ENCOUNTER — Encounter: Payer: Self-pay | Admitting: Internal Medicine

## 2018-07-15 ENCOUNTER — Ambulatory Visit (INDEPENDENT_AMBULATORY_CARE_PROVIDER_SITE_OTHER): Payer: Medicare Other | Admitting: Internal Medicine

## 2018-07-15 VITALS — BP 134/74 | HR 95 | Resp 16 | Ht 71.5 in | Wt 263.0 lb

## 2018-07-15 DIAGNOSIS — G4733 Obstructive sleep apnea (adult) (pediatric): Secondary | ICD-10-CM

## 2018-07-15 DIAGNOSIS — G4731 Primary central sleep apnea: Secondary | ICD-10-CM

## 2018-07-15 MED ORDER — IPRATROPIUM-ALBUTEROL 0.5-2.5 (3) MG/3ML IN SOLN: 3.0000 mL | Freq: Four times a day (QID) | RESPIRATORY_TRACT | 1 refills | Status: DC

## 2018-07-15 NOTE — Progress Notes (Signed)
[facility] Consultation      Assessment and Plan:  Severe obstructive sleep apnea -Per report patient had a severe obstructive sleep apnea, and started on CPAP, appears intolerant at this time. - We will obtain sleep study report for review. -Continue excessive daytime sleepiness.  Central sleep apnea. - Suspect that this is treatment emergent, however the patient is also on numerous sedating medications also has liver cirrhosis may be contributing to it. -We will send for a titration study which changed to BiPAP as needed. -After this may benefit from a formal mask fitting depending on tolerance.  Insomnia. - Has severe insomnia, appears to be sleep maintenance type.  May be contributed to by severe sleep apnea as well as CPAP intolerance. -Will reevaluate after CPAP is better tolerated.  However the patient is already on a number of sedating medications, and has liver cirrhosis, and has numerous medication allergies so therefore will need to be very cautious with any potential sedating medications.   Date: 07/15/2018  MRN# [id number] Ray Robles October 26, 1961    Ray Robles is a 57 y.o. old male seen in consultation for chief complaint of:    Chief Complaint  Patient presents with  . Consult    referred by Dr. [name] for eval of possible OSA. Pt had sleep study hst 04/2018 DME: [facility] , he is already set up on cpap  . Sleep Apnea    c/o of air gushing    HPI:  The patient is a 57 year old male with a history of obstructive sleep apnea, currently on Pap.  He is seen today because of a complaints of insomnia.  Patient has multiple medication allergies, Ambien (while in sleep), morphine, Xanax (brings ammonia levels up), zaleplon (seizures).  Current medications include Suboxone, Seroquel 300 mg at bedtime, Rozerem 8 mg at bedtime, Sonata 10 mg at bedtime.  He goes to bed between 10 and 11 pm he usually falls asleep quickly. He usually wakes up 1-2 hours  later. He will then hav trouble falling asleep.   He noted that he was having a lot of nightmares and frequent awakening, he was then sent for a sleep study which was an HST. He was told that he has severe OSA and was started on CPAP about 3 months ago. Since that time he continues to have insomnia, he also notes that he will wake up with very high pressures and a lot of air leak.   He has a history of cirrhosis from HCV. He was told that he is end stage. He is followed by a hepatology at [facility].   **Download data 06/15/2018-07/14/2018>> raw data personally reviewed.  Usage greater than 4 hours is 2/30 days.  Set pressure is 4-20.  Median pressure is 13, 95th percentile pressure 17, maximum pressure is 18.  Residual AHI is 58.  Central apnea index is 23.6.  Overall this shows poor compliance with poor control of obstructive sleep apnea with central apnea which is likely treatment emergent.  PMHX:   Past Medical History:  Diagnosis Date  . Anxiety   . Arthritis   . Cirrhosis ([place])   . Depression   . Dyspnea    doe  . Dysrhythmia   . GERD (gastroesophageal reflux disease)    food sticks in throat has esophagus stretched  . H/O bacterial pneumonia 11/11/2007   2008 and 2015   . Heart murmur   . Hepatitis C   . Hx of endoscopy    05/12/2014-  [facility]. Dr Candace Cruise. Benign appearing stricture. Dilated the examination was otherwise normal.02/13/2012- Beniign appearing intrinsic mild stenosis at GE junction, successfully dialated  . Hypertension   . Hypothyroidism   . Liver disease   . Neuromuscular disorder ([place])    chronic pain peripheral neuropathy  . Pre-diabetes   . PTSD (post-traumatic stress disorder)    Surgical Hx:  Past Surgical History:  Procedure Laterality Date  . COLONOSCOPY WITH PROPOFOL N/A 05/12/2015   Procedure: COLONOSCOPY WITH PROPOFOL;  Surgeon: Hulen Luster, MD;  Location: [facility]COPY;  Service: Gastroenterology;  Laterality: N/A;  . edg    . HERNIA REPAIR  08/2012   Dr.  Jamal Collin; [facility]  . INGUINAL HERNIA REPAIR Right 10/28/2017   Procedure: HERNIA REPAIR INGUINAL ADULT;  Surgeon: Leonie Green, MD;  Location: [facility] ORS;  Service: General;  Laterality: Right;   Family Hx:  Family History  Problem Relation Age of Onset  . Cancer Mother   . Cancer Father   . Alcohol abuse Brother   . Alcohol abuse Brother   . Drug abuse Brother    Social Hx:   Social History   Tobacco Use  . Smoking status: Never Smoker  . Smokeless tobacco: Never Used  Substance Use Topics  . Alcohol use: No    Alcohol/week: 0.0 standard drinks  . Drug use: Not Currently    Types: Oxycodone    Comment: none since 3/18   Medication:    Current Outpatient Medications:  .  buprenorphine-naloxone (SUBOXONE) 2-0.5 mg SUBL SL tablet, Place 1-2 tablets See admin instructions under the tongue. Take 2 tablets by mouth in the morning, take 1 tablet by mouth midday and take 2 tablets by mouth at bedtime, Disp: , Rfl:  .  diphenoxylate-atropine (LOMOTIL) 2.5-0.025 MG per tablet, Take 2 tablets by mouth every 6 (six) hours as needed for diarrhea or loose stools. , Disp: , Rfl:  .  escitalopram (LEXAPRO) 5 MG tablet, Take 1 tablet (5 mg total) by mouth daily., Disp: 30 tablet, Rfl: 3 .  ezetimibe (ZETIA) 10 MG tablet, Take 1 tablet (10 mg total) by mouth daily., Disp: 30 tablet, Rfl: 2 .  furosemide (LASIX) 40 MG tablet, Take 1 tablet (40 mg total) by mouth 2 (two) times daily. (Patient taking differently: Take 40 mg daily by mouth. ), Disp: 1 tablet, Rfl: 1 .  gabapentin (NEURONTIN) 300 MG capsule, Take 1 capsule (300 mg total) by mouth 2 (two) times daily., Disp: 60 capsule, Rfl: 12 .  hydrOXYzine (ATARAX/VISTARIL) 10 MG tablet, Take 1-2 tablets (10-20 mg total) by mouth at bedtime., Disp: 30 tablet, Rfl: 5 .  hyoscyamine (ANASPAZ) 0.125 MG TBDP disintergrating tablet, Place 0.125 mg every 4 (four) hours as needed under the tongue for bladder spasms or cramping., Disp: , Rfl:  .  lactulose  (CHRONULAC) 10 GM/15ML solution, TAKE 30 MLS BY MOUTH 3 TIMES DAILY (Patient taking differently: Take 20 gm by mouth daily), Disp: 240 mL, Rfl: 12 .  levothyroxine (SYNTHROID, LEVOTHROID) 75 MCG tablet, TAKE 1 TABLET BY MOUTH ONCE DAILY (Patient taking differently: TAKE 75 MCG BY MOUTH ONCE DAILY), Disp: 30 tablet, Rfl: 11 .  Melatonin 10 MG CAPS, Take 10 mg at bedtime by mouth., Disp: , Rfl:  .  metoprolol tartrate (LOPRESSOR) 25 MG tablet, TAKE 1 TABLET BY MOUTH TWICE DAILY (Patient taking differently: TAKE 25 MG BY MOUTH TWICE DAILY), Disp: 30 tablet, Rfl: 12 .  metroNIDAZOLE (METROCREAM) 0.75 % cream, APPLY TOPICALLY AT BEDTIME, Disp: 45  g, Rfl: 3 .  nitroGLYCERIN (NITROSTAT) 0.4 MG SL tablet, Place 0.4 mg under the tongue every 5 (five) minutes as needed for chest pain., Disp: , Rfl:  .  nystatin cream (MYCOSTATIN), Apply 1 application 3 (three) times daily as needed topically (for rash). , Disp: , Rfl:  .  omeprazole (PRILOSEC) 20 MG capsule, TAKE 2 CAPSULES BY MOUTH ONCE DAILY, Disp: 60 capsule, Rfl: 12 .  ondansetron (ZOFRAN-ODT) 4 MG disintegrating tablet, Take 4 mg by mouth every 8 (eight) hours as needed for nausea or vomiting., Disp: , Rfl:  .  oxyCODONE (ROXICODONE) 5 MG immediate release tablet, 1-2 tablets each 6 hours as needed for pain, Disp: 20 tablet, Rfl: 0 .  prazosin (MINIPRESS) 5 MG capsule, Take 1 capsule (5 mg total) by mouth at bedtime., Disp: 30 capsule, Rfl: 3 .  QUEtiapine (SEROQUEL) 300 MG tablet, Take 1 tablet (300 mg total) by mouth at bedtime., Disp: 30 tablet, Rfl: 3 .  rifaximin (XIFAXAN) 550 MG TABS tablet, Take 550 mg 2 (two) times daily by mouth., Disp: , Rfl:  .  ROZEREM 8 MG tablet, TAKE 1 TABLET BY MOUTH AT BEDTIME., Disp: 30 tablet, Rfl: 5 .  sildenafil (REVATIO) 20 MG tablet, Take 40-60 mg as needed by mouth (for ED). No more than 3 in a day, Disp: , Rfl:  .  spironolactone (ALDACTONE) 50 MG tablet, Take 1 tablet (50 mg total) by mouth daily. (Patient taking  differently: Take 50 mg by mouth 2 (two) times daily. ), Disp: 30 tablet, Rfl: 0 .  tamsulosin (FLOMAX) 0.4 MG CAPS capsule, Take 1 capsule (0.4 mg total) by mouth every evening., Disp: 30 capsule, Rfl: 2 .  venlafaxine XR (EFFEXOR-XR) 150 MG 24 hr capsule, Take 1 capsule (150 mg total) by mouth daily with breakfast., Disp: 30 capsule, Rfl: 3 .  zaleplon (SONATA) 10 MG capsule, TAKE 1 CAPSULE BY MOUTH AT BEDTIME AS NEEDED, Disp: 30 capsule, Rfl: 5   Allergies:  Ambien [zolpidem tartrate]; Ibuprofen; Morphine; Morphine and related; Tylenol [acetaminophen]; Xanax [alprazolam]; Zaleplon; and Zolpidem  Review of Systems: Gen:  Denies  fever, sweats, chills HEENT: Denies blurred vision, double vision. bleeds, sore throat Cvc:  No dizziness, chest pain. Resp:   Denies cough or sputum production, shortness of breath Gi: Denies swallowing difficulty, stomach pain. Gu:  Denies bladder incontinence, burning urine Ext:   No Joint pain, stiffness. Skin: No skin rash,  hives  Endoc:  No polyuria, polydipsia. Psych: No depression, insomnia. Other:  All other systems were reviewed with the patient and were negative other that what is mentioned in the HPI.   Physical Examination:   VS: BP 134/74 (BP Location: Left Arm, Cuff Size: Large)   Pulse 95   Resp 16   Ht 5' 11.5" (1.816 m)   Wt 263 lb (119.3 kg)   SpO2 93%   BMI 36.17 kg/m   General Appearance: No distress  Neuro:without focal findings,  speech normal,  HEENT: PERRLA, EOM intact.   Pulmonary: normal breath sounds, No wheezing.  CardiovascularNormal S1,S2.  No m/r/g.   Abdomen: Benign, Soft, non-tender. Renal:  No costovertebral tenderness  GU:  No performed at this time. Endoc: No evident thyromegaly, no signs of acromegaly. Skin:   warm, no rashes, no ecchymosis  Extremities: normal, 3+ lower extremity edema  Other findings:    LABORATORY PANEL:   CBC No results for input(s): WBC, HGB, HCT, PLT in the last 168  hours. ------------------------------------------------------------------------------------------------------------------  Chemistries  No  results for input(s): NA, K, CL, CO2, GLUCOSE, BUN, CREATININE, CALCIUM, MG, AST, ALT, ALKPHOS, BILITOT in the last 168 hours.  Invalid input(s): GFRCGP ------------------------------------------------------------------------------------------------------------------  Cardiac Enzymes No results for input(s): TROPONINI in the last 168 hours. ------------------------------------------------------------  RADIOLOGY:  No results found.     Thank  you for the consultation and for allowing [facility] to assist in the care of your patient. Our recommendations are noted above.  Please contact us if we can be of further service.   Marda Stalker, M.D., F.C.C.P.  Board Certified in Internal Medicine, Pulmonary Medicine, [place], and Sleep Medicine.  [facility] Office Number: [phone]   07/15/2018

## 2018-07-15 NOTE — Patient Instructions (Signed)
Will send you for a titration test to see if you require a bipap.

## 2018-07-20 ENCOUNTER — Other Ambulatory Visit: Payer: Self-pay | Admitting: Family Medicine

## 2018-07-20 DIAGNOSIS — G4700 Insomnia, unspecified: Secondary | ICD-10-CM

## 2018-07-27 DIAGNOSIS — K7460 Unspecified cirrhosis of liver: Secondary | ICD-10-CM | POA: Diagnosis not present

## 2018-07-29 DIAGNOSIS — R188 Other ascites: Secondary | ICD-10-CM | POA: Diagnosis not present

## 2018-07-29 DIAGNOSIS — E039 Hypothyroidism, unspecified: Secondary | ICD-10-CM | POA: Diagnosis not present

## 2018-07-29 DIAGNOSIS — K7460 Unspecified cirrhosis of liver: Secondary | ICD-10-CM | POA: Diagnosis not present

## 2018-07-29 DIAGNOSIS — R5383 Other fatigue: Secondary | ICD-10-CM | POA: Diagnosis not present

## 2018-07-29 LAB — T4, FREE
T4,FREE (DIRECT): 1.49
TSH: 0.379

## 2018-07-29 LAB — TSH: TSH: 0.38 — AB (ref 0.41–5.90)

## 2018-07-30 ENCOUNTER — Ambulatory Visit: Payer: Medicare Other | Attending: Internal Medicine

## 2018-07-30 DIAGNOSIS — G4733 Obstructive sleep apnea (adult) (pediatric): Secondary | ICD-10-CM | POA: Diagnosis not present

## 2018-08-06 ENCOUNTER — Ambulatory Visit (INDEPENDENT_AMBULATORY_CARE_PROVIDER_SITE_OTHER): Payer: Medicare Other | Admitting: Family Medicine

## 2018-08-06 ENCOUNTER — Encounter: Payer: Self-pay | Admitting: Family Medicine

## 2018-08-06 VITALS — BP 120/81 | HR 75 | Temp 97.9°F | Resp 16 | Wt 256.0 lb

## 2018-08-06 DIAGNOSIS — E785 Hyperlipidemia, unspecified: Secondary | ICD-10-CM | POA: Insufficient documentation

## 2018-08-06 DIAGNOSIS — E039 Hypothyroidism, unspecified: Secondary | ICD-10-CM | POA: Diagnosis not present

## 2018-08-06 DIAGNOSIS — L719 Rosacea, unspecified: Secondary | ICD-10-CM

## 2018-08-06 DIAGNOSIS — Z23 Encounter for immunization: Secondary | ICD-10-CM

## 2018-08-06 DIAGNOSIS — L282 Other prurigo: Secondary | ICD-10-CM

## 2018-08-06 MED ORDER — TRIAMCINOLONE ACETONIDE 0.1 % EX CREA: 1.0000 "application " | TOPICAL_CREAM | Freq: Two times a day (BID) | CUTANEOUS | 2 refills | Status: DC | PRN

## 2018-08-06 MED ORDER — LEVOTHYROXINE SODIUM 50 MCG PO TABS: 50.0000 ug | ORAL_TABLET | Freq: Every day | ORAL | 3 refills | Status: DC

## 2018-08-06 NOTE — Progress Notes (Signed)
Patient: Ray Robles Male    DOB: 18-Feb-1961   57 y.o.   MRN: [id number] Visit Date: 08/06/2018  Today's Provider: Lelon Huh, MD   Chief Complaint  Patient presents with  . Rash  . Hypothyroidism   Subjective:    Rash  This is a recurrent problem. The current episode started more than 1 year ago. The problem has been waxing and waning since onset. The affected locations include the face, left arm and right arm. The rash is characterized by redness and itchiness. Pertinent negatives include no fever, shortness of breath or vomiting.  He has history of rosacea and uses metronidazole off and on. He restarted it about a week ago and didn't seem to help with rash on fast, but uses some moisturizing cream yesterday and rash is much better today.  Rash on his arms started up after working outside mowing this weekend and he thinks it is probably due to some bug bites.   Hypothyroidism: Patient comes in reporting that he had labs drawn 1 week ago through [facility], which showed that his thyroid levels were off. Patient would like his medication adjusted.  Labs were done on 08/28/21019 and the results were: TSH: 0.379 T4: 1.49    Allergies  Allergen Reactions  . Ambien [Zolpidem Tartrate] Other (See Comments)    Reaction: Violent sleep  . Ibuprofen Other (See Comments)    Can't take because of liver  . Morphine Diarrhea and Nausea And Vomiting  . Morphine And Related Diarrhea and Nausea And Vomiting  . Tylenol [Acetaminophen] Other (See Comments)    Can't take because of liver  . Xanax [Alprazolam] Other (See Comments)    Brings the ammonia levels up  . Zaleplon     seizures  . Zolpidem Other (See Comments)    Reaction: Violent Sleep     Current Outpatient Medications:  .  buprenorphine-naloxone (SUBOXONE) 2-0.5 mg SUBL SL tablet, Place 1-2 tablets See admin instructions under the tongue. Take 2 tablets by mouth in the morning, take 1 tablet by mouth midday and  take 2 tablets by mouth at bedtime, Disp: , Rfl:  .  diphenoxylate-atropine (LOMOTIL) 2.5-0.025 MG per tablet, Take 2 tablets by mouth every 6 (six) hours as needed for diarrhea or loose stools. , Disp: , Rfl:  .  escitalopram (LEXAPRO) 5 MG tablet, Take 1 tablet (5 mg total) by mouth daily., Disp: 30 tablet, Rfl: 3 .  ezetimibe (ZETIA) 10 MG tablet, Take 1 tablet (10 mg total) by mouth daily., Disp: 30 tablet, Rfl: 2 .  furosemide (LASIX) 40 MG tablet, Take 1 tablet (40 mg total) by mouth 2 (two) times daily. (Patient taking differently: Take 40 mg daily by mouth. ), Disp: 1 tablet, Rfl: 1 .  gabapentin (NEURONTIN) 300 MG capsule, Take 1 capsule (300 mg total) by mouth 2 (two) times daily., Disp: 60 capsule, Rfl: 12 .  hydrOXYzine (ATARAX/VISTARIL) 10 MG tablet, TAKE 1 OR 2 TABLETS BY MOUTH AT BEDTIME, Disp: 30 tablet, Rfl: 5 .  hyoscyamine (ANASPAZ) 0.125 MG TBDP disintergrating tablet, Place 0.125 mg every 4 (four) hours as needed under the tongue for bladder spasms or cramping., Disp: , Rfl:  .  ipratropium-albuterol (DUONEB) 0.5-2.5 (3) MG/3ML SOLN, Take 3 mLs by nebulization 4 (four) times daily., Disp: 360 mL, Rfl: 1 .  lactulose (CHRONULAC) 10 GM/15ML solution, TAKE 30 MLS BY MOUTH 3 TIMES DAILY (Patient taking differently: Take 20 gm by mouth daily), Disp: 240  mL, Rfl: 12 .  levothyroxine (SYNTHROID, LEVOTHROID) 75 MCG tablet, TAKE 1 TABLET BY MOUTH ONCE DAILY (Patient taking differently: TAKE 75 MCG BY MOUTH ONCE DAILY), Disp: 30 tablet, Rfl: 11 .  Melatonin 10 MG CAPS, Take 10 mg at bedtime by mouth., Disp: , Rfl:  .  metoprolol tartrate (LOPRESSOR) 25 MG tablet, TAKE 1 TABLET BY MOUTH TWICE DAILY (Patient taking differently: TAKE 25 MG BY MOUTH TWICE DAILY), Disp: 30 tablet, Rfl: 12 .  metroNIDAZOLE (METROCREAM) 0.75 % cream, APPLY TOPICALLY AT BEDTIME, Disp: 45 g, Rfl: 3 .  nitroGLYCERIN (NITROSTAT) 0.4 MG SL tablet, Place 0.4 mg under the tongue every 5 (five) minutes as needed for chest  pain., Disp: , Rfl:  .  nystatin cream (MYCOSTATIN), Apply 1 application 3 (three) times daily as needed topically (for rash). , Disp: , Rfl:  .  omeprazole (PRILOSEC) 20 MG capsule, TAKE 2 CAPSULES BY MOUTH ONCE DAILY, Disp: 60 capsule, Rfl: 12 .  ondansetron (ZOFRAN-ODT) 4 MG disintegrating tablet, Take 4 mg by mouth every 8 (eight) hours as needed for nausea or vomiting., Disp: , Rfl:  .  oxyCODONE (ROXICODONE) 5 MG immediate release tablet, 1-2 tablets each 6 hours as needed for pain, Disp: 20 tablet, Rfl: 0 .  prazosin (MINIPRESS) 5 MG capsule, Take 1 capsule (5 mg total) by mouth at bedtime., Disp: 30 capsule, Rfl: 3 .  QUEtiapine (SEROQUEL) 300 MG tablet, Take 1 tablet (300 mg total) by mouth at bedtime., Disp: 30 tablet, Rfl: 3 .  rifaximin (XIFAXAN) 550 MG TABS tablet, Take 550 mg 2 (two) times daily by mouth., Disp: , Rfl:  .  ROZEREM 8 MG tablet, TAKE 1 TABLET BY MOUTH AT BEDTIME., Disp: 30 tablet, Rfl: 5 .  sildenafil (REVATIO) 20 MG tablet, Take 40-60 mg as needed by mouth (for ED). No more than 3 in a day, Disp: , Rfl:  .  spironolactone (ALDACTONE) 50 MG tablet, Take 1 tablet (50 mg total) by mouth daily. (Patient taking differently: Take 50 mg by mouth 2 (two) times daily. ), Disp: 30 tablet, Rfl: 0 .  tamsulosin (FLOMAX) 0.4 MG CAPS capsule, Take 1 capsule (0.4 mg total) by mouth every evening., Disp: 30 capsule, Rfl: 2 .  venlafaxine XR (EFFEXOR-XR) 150 MG 24 hr capsule, Take 1 capsule (150 mg total) by mouth daily with breakfast., Disp: 30 capsule, Rfl: 3 .  zaleplon (SONATA) 10 MG capsule, TAKE 1 CAPSULE BY MOUTH AT BEDTIME AS NEEDED, Disp: 30 capsule, Rfl: 5  Review of Systems  Constitutional: Negative for appetite change, chills and fever.  Respiratory: Negative for chest tightness, shortness of breath and wheezing.   Cardiovascular: Negative for chest pain and palpitations.  Gastrointestinal: Negative for abdominal pain, nausea and vomiting.  Skin: Positive for rash.     Social History   Tobacco Use  . Smoking status: Never Smoker  . Smokeless tobacco: Never Used  Substance Use Topics  . Alcohol use: No    Alcohol/week: 0.0 standard drinks   Objective:   BP 120/81 (BP Location: Left Arm, Patient Position: Sitting, Cuff Size: Large)   Pulse 75   Temp 97.9 F (36.6 C) (Oral)   Resp 16   Wt 256 lb (116.1 kg)   SpO2 97% Comment: room air  BMI 35.21 kg/m  Vitals:   08/06/18 1145  BP: 120/81  Pulse: 75  Resp: 16  Temp: 97.9 F (36.6 C)  TempSrc: Oral  SpO2: 97%  Weight: 256 lb (116.1 kg)  Physical Exam  General appearance: alert, well developed, well nourished, cooperative and in no distress Head: Normocephalic, without obvious abnormality, atraumatic Respiratory: Respirations even and unlabored, normal respiratory rate Extremities: malar and forehead erythema suggestive of rosacea noted. Several papular lesions on both forearms.  Skin: Skin color, texture, turgor normal. No rashes seen  Psych: Appropriate mood and affect. Neurologic: Mental status: Alert, oriented to person, place, and time, thought content appropriate.     Assessment & Plan:     1. Rosacea (suspected) Slight improvement with addition of moisturizing cream to metronidazole. Is to continue metronidazole and moisturizer every day and may use small amount of triamcinolone more more dramatic flares.    2. Hypothyroidism, unspecified type Slightly hyPER thyroid on recent labs done at [facility] reduce - levothyroxine (SYNTHROID, LEVOTHROID) to 50 MCG tablet; Take 1 tablet (50 mcg total) by mouth daily.  Dispense: 30 tablet; Refill: 3  3. Papular urticaria Can use triamcinolone prescription prn.   4.. Hyperlipidemia Is tolerating ezetimibe well. Will recheck lipids with thyroid functions at follow up.   Flu vaccine today.       Lelon Huh, MD  [facility]

## 2018-08-10 ENCOUNTER — Ambulatory Visit (INDEPENDENT_AMBULATORY_CARE_PROVIDER_SITE_OTHER): Payer: Medicare Other | Admitting: Psychiatry

## 2018-08-10 ENCOUNTER — Encounter: Payer: Self-pay | Admitting: Psychiatry

## 2018-08-10 DIAGNOSIS — F331 Major depressive disorder, recurrent, moderate: Secondary | ICD-10-CM | POA: Diagnosis not present

## 2018-08-10 MED ORDER — PRAZOSIN HCL 5 MG PO CAPS: 5.0000 mg | ORAL_CAPSULE | Freq: Every day | ORAL | 3 refills | Status: DC

## 2018-08-10 MED ORDER — QUETIAPINE FUMARATE 300 MG PO TABS: 300.0000 mg | ORAL_TABLET | Freq: Every day | ORAL | 3 refills | Status: DC

## 2018-08-10 MED ORDER — VENLAFAXINE HCL ER 150 MG PO CP24: 150.0000 mg | ORAL_CAPSULE | Freq: Every day | ORAL | 3 refills | Status: DC
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
## 2018-08-13 DIAGNOSIS — E039 Hypothyroidism, unspecified: Secondary | ICD-10-CM

## 2018-08-17 ENCOUNTER — Ambulatory Visit: Payer: Self-pay | Admitting: Family Medicine

## 2018-08-19 ENCOUNTER — Telehealth: Payer: Self-pay | Admitting: *Deleted

## 2018-08-19 DIAGNOSIS — G4733 Obstructive sleep apnea (adult) (pediatric): Secondary | ICD-10-CM

## 2018-08-19 NOTE — Telephone Encounter (Signed)
2nd message for sleep study results.

## 2018-08-19 NOTE — Telephone Encounter (Signed)
Left message to call back. Recommendation: Bilevel SV min 10 max 25 pressure support 6 Dos:9/3 /19

## 2018-08-20 NOTE — Telephone Encounter (Signed)
Patient returning our call  And left a better number to call on

## 2018-08-20 NOTE — Telephone Encounter (Signed)
3 attempt to contact patient with sleep study results. Orders will be placed after patient contacts office.

## 2018-08-20 NOTE — Addendum Note (Signed)
Addended by: Stephanie Coup on: 08/20/2018 01:45 PM   Modules accepted: Orders

## 2018-08-20 NOTE — Telephone Encounter (Signed)
Patient aware of results. Bipap ordered nothing further needed.

## 2018-08-25 ENCOUNTER — Encounter: Payer: Self-pay | Admitting: Internal Medicine

## 2018-08-27 ENCOUNTER — Ambulatory Visit (INDEPENDENT_AMBULATORY_CARE_PROVIDER_SITE_OTHER): Payer: Medicare Other | Admitting: Internal Medicine

## 2018-08-27 ENCOUNTER — Encounter: Payer: Self-pay | Admitting: Internal Medicine

## 2018-08-27 VITALS — BP 122/74 | HR 77 | Resp 16 | Ht 71.5 in | Wt 257.0 lb

## 2018-08-27 DIAGNOSIS — G4733 Obstructive sleep apnea (adult) (pediatric): Secondary | ICD-10-CM

## 2018-08-27 DIAGNOSIS — G4731 Primary central sleep apnea: Secondary | ICD-10-CM

## 2018-08-27 NOTE — Addendum Note (Signed)
Addended by: Stephanie Coup on: 08/27/2018 04:08 PM   Modules accepted: Orders

## 2018-08-27 NOTE — Patient Instructions (Addendum)
Will change to Bipap SV, EPAP of 8, pressure support of 4, Maximum Inspiratory pressure of 15.  Will refer for PAP-NAP.

## 2018-08-27 NOTE — Progress Notes (Signed)
[facility] Consultation      Assessment and Plan:  Severe obstructive sleep apnea -Will change to Bipap SV, EPAP of 8, pressure support of 4, Maximum Inspiratory pressure of 15.  --Will refer for PAP-NAP.   Central sleep apnea - As above, needs to be on Bipap.   Insomnia. - Has severe insomnia, appears to be sleep maintenance type.  May be contributed to by severe sleep apnea as well as CPAP intolerance. --Will send for PAP NAP to better tolerate pressure.  -Will reevaluate after PAP is better tolerated.  However the patient is already on a number of sedating medications, and has liver cirrhosis, and has numerous medication allergies so therefore will need to be very cautious with any potential sedating medications.   Date: 08/27/2018  MRN# [id number] Ray Robles 1961/03/31     Ray Robles is a 57 y.o. old male seen in consultation for chief complaint of:    Chief Complaint  Patient presents with  . Sleep Apnea    not compliant on cpap; pt unaware that he was to continue on cpap until receiving bipap.    HPI:  The patient is a 57 year old male with a history of obstructive sleep apnea, currently on Pap.  He is seen today because of a complaints of insomnia.  Patient has multiple medication allergies, Ambien (while in sleep), morphine, Xanax (brings ammonia levels up), zaleplon (seizures).  Current medications include Suboxone, Seroquel 300 mg at bedtime, Rozerem 8 mg at bedtime, Sonata 10 mg at bedtime.   He had a sleep study which showed severe mixed apnea, he was recommended to be on bipap Min EPAP10, PS 6, Max IPAP 25, however on download today he appears to be on auto-PAP. The pressure is ramping up to about 20.  Since his last visit he has not been using cpap. He tried it for a few days, but he felt that the pressure was too much and was waking him up. He found it aggravating and did not try to use it again. It felt ok when first starting, the mask  felt comfortable. He was more bothered by the pressure.   He has a history of cirrhosis from HCV. He was told that he is end stage. He is followed by a hepatology at [facility].   **07/27/2018-08/25/2018>> usage greater than 4 hours is 0/30 days.  Average usage on days used is 44 minutes.  Pressure ranges 4-20.  Median pressure is 11, 93rd percentile pressure is 18, maximum pressure is 20.  Residual AHI is 69 with a central apnea index of 24. **Download data 06/15/2018-07/14/2018>> raw data personally reviewed.  Usage greater than 4 hours is 2/30 days.  Set pressure is 4-20.  Median pressure is 13, 95th percentile pressure 17, maximum pressure is 18.  Residual AHI is 58.  Central apnea index is 23.6.  Overall this shows poor compliance with poor control of obstructive sleep apnea with central apnea which is likely treatment emergent. **BiPAP titration 07/30/2018>> recommended bilevel SV, minimum EPAP of 10, pressure support 6, maximum inspiratory pressure 25. **Home sleep test 04/15/2018>> severe obstructive sleep apnea with AHI of 82.7.  Medication:    Current Outpatient Medications:  .  buprenorphine-naloxone (SUBOXONE) 2-0.5 mg SUBL SL tablet, Place 1-2 tablets See admin instructions under the tongue. Take 2 tablets by mouth in the morning, take 1 tablet by mouth midday and take 2 tablets by mouth at bedtime, Disp: , Rfl:  .  diphenoxylate-atropine (LOMOTIL) 2.5-0.025 MG  per tablet, Take 2 tablets by mouth every 6 (six) hours as needed for diarrhea or loose stools. , Disp: , Rfl:  .  escitalopram (LEXAPRO) 5 MG tablet, Take 1 tablet (5 mg total) by mouth daily., Disp: 30 tablet, Rfl: 3 .  ezetimibe (ZETIA) 10 MG tablet, Take 1 tablet (10 mg total) by mouth daily., Disp: 30 tablet, Rfl: 2 .  furosemide (LASIX) 40 MG tablet, Take 1 tablet (40 mg total) by mouth 2 (two) times daily. (Patient taking differently: Take 40 mg daily by mouth. ), Disp: 1 tablet, Rfl: 1 .  gabapentin (NEURONTIN) 300 MG capsule, Take 1  capsule (300 mg total) by mouth 2 (two) times daily., Disp: 60 capsule, Rfl: 12 .  hydrOXYzine (ATARAX/VISTARIL) 10 MG tablet, TAKE 1 OR 2 TABLETS BY MOUTH AT BEDTIME, Disp: 30 tablet, Rfl: 5 .  ipratropium-albuterol (DUONEB) 0.5-2.5 (3) MG/3ML SOLN, Take 3 mLs by nebulization 4 (four) times daily., Disp: 360 mL, Rfl: 1 .  lactulose (CHRONULAC) 10 GM/15ML solution, TAKE 30 MLS BY MOUTH 3 TIMES DAILY (Patient taking differently: Take 20 gm by mouth daily), Disp: 240 mL, Rfl: 12 .  levothyroxine (SYNTHROID, LEVOTHROID) 50 MCG tablet, Take 1 tablet (50 mcg total) by mouth daily., Disp: 30 tablet, Rfl: 3 .  Melatonin 10 MG CAPS, Take 10 mg at bedtime by mouth., Disp: , Rfl:  .  metoprolol tartrate (LOPRESSOR) 25 MG tablet, TAKE 1 TABLET BY MOUTH TWICE DAILY (Patient taking differently: TAKE 25 MG BY MOUTH TWICE DAILY), Disp: 30 tablet, Rfl: 12 .  metroNIDAZOLE (METROCREAM) 0.75 % cream, APPLY TOPICALLY AT BEDTIME, Disp: 45 g, Rfl: 3 .  nitroGLYCERIN (NITROSTAT) 0.4 MG SL tablet, Place 0.4 mg under the tongue every 5 (five) minutes as needed for chest pain., Disp: , Rfl:  .  nystatin cream (MYCOSTATIN), Apply 1 application 3 (three) times daily as needed topically (for rash). , Disp: , Rfl:  .  prazosin (MINIPRESS) 5 MG capsule, Take 1 capsule (5 mg total) by mouth at bedtime., Disp: 30 capsule, Rfl: 3 .  QUEtiapine (SEROQUEL) 300 MG tablet, Take 1 tablet (300 mg total) by mouth at bedtime., Disp: 30 tablet, Rfl: 3 .  ROZEREM 8 MG tablet, TAKE 1 TABLET BY MOUTH AT BEDTIME., Disp: 30 tablet, Rfl: 5 .  spironolactone (ALDACTONE) 50 MG tablet, Take 1 tablet (50 mg total) by mouth daily. (Patient taking differently: Take 50 mg by mouth 2 (two) times daily. ), Disp: 30 tablet, Rfl: 0 .  tamsulosin (FLOMAX) 0.4 MG CAPS capsule, Take 1 capsule (0.4 mg total) by mouth every evening., Disp: 30 capsule, Rfl: 2 .  triamcinolone cream (KENALOG) 0.1 %, Apply 1 application topically 2 (two) times daily as needed., Disp:  30 g, Rfl: 2 .  venlafaxine XR (EFFEXOR-XR) 150 MG 24 hr capsule, Take 1 capsule (150 mg total) by mouth daily with breakfast., Disp: 30 capsule, Rfl: 3   Allergies:  Ambien [zolpidem tartrate]; Ibuprofen; Morphine; Morphine and related; Tylenol [acetaminophen]; Xanax [alprazolam]; Zaleplon; and Zolpidem  Review of Systems:  Constitutional: Feels well. Cardiovascular: Denies chest pain, exertional chest pain.  Pulmonary: Denies hemoptysis, pleuritic chest pain.   The remainder of systems were reviewed and were found to be negative other than what is documented in the HPI.    Physical Examination:   VS: BP 122/74 (BP Location: Left Arm, Cuff Size: Large)   Pulse 77   Resp 16   Ht 5' 11.5" (1.816 m)   Wt 257 lb (116.6 kg)  SpO2 94%   BMI 35.34 kg/m   General Appearance: No distress  Neuro:without focal findings, mental status, speech normal, alert and oriented HEENT: PERRLA, EOM intact Pulmonary: No wheezing, No rales  CardiovascularNormal S1,S2.  No m/r/g.  Abdomen: Benign, Soft, non-tender, No masses Renal:  No costovertebral tenderness  GU:  No performed at this time. Endoc: No evident thyromegaly, no signs of acromegaly or Cushing features Skin:   warm, no rashes, no ecchymosis  Extremities: normal, no cyanosis, clubbing.       LABORATORY PANEL:   CBC No results for input(s): WBC, HGB, HCT, PLT in the last 168 hours. ------------------------------------------------------------------------------------------------------------------  Chemistries  No results for input(s): NA, K, CL, CO2, GLUCOSE, BUN, CREATININE, CALCIUM, MG, AST, ALT, ALKPHOS, BILITOT in the last 168 hours.  Invalid input(s): GFRCGP ------------------------------------------------------------------------------------------------------------------  Cardiac Enzymes No results for input(s): TROPONINI in the last 168 hours. ------------------------------------------------------------  RADIOLOGY:    No results found.     Thank  you for the consultation and for allowing [facility] to assist in the care of your patient. Our recommendations are noted above.  Please contact us if we can be of further service.   Marda Stalker, M.D., F.C.C.P.  Board Certified in Internal Medicine, Pulmonary Medicine, [place], and Sleep Medicine.  [facility] Office Number: [phone]   08/27/2018

## 2018-09-07 ENCOUNTER — Other Ambulatory Visit: Payer: Self-pay | Admitting: Family Medicine

## 2018-09-07 DIAGNOSIS — E785 Hyperlipidemia, unspecified: Secondary | ICD-10-CM

## 2018-09-19 ENCOUNTER — Other Ambulatory Visit: Payer: Self-pay | Admitting: Family Medicine

## 2018-09-19 DIAGNOSIS — I10 Essential (primary) hypertension: Secondary | ICD-10-CM

## 2018-10-05 ENCOUNTER — Ambulatory Visit: Payer: Medicare Other | Admitting: Psychiatry

## 2018-10-07 ENCOUNTER — Ambulatory Visit (INDEPENDENT_AMBULATORY_CARE_PROVIDER_SITE_OTHER): Payer: Medicare Other | Admitting: Family Medicine

## 2018-10-07 ENCOUNTER — Encounter: Payer: Self-pay | Admitting: Family Medicine

## 2018-10-07 VITALS — BP 132/84 | HR 84 | Temp 97.4°F | Resp 16 | Wt 259.0 lb

## 2018-10-07 DIAGNOSIS — R351 Nocturia: Secondary | ICD-10-CM | POA: Diagnosis not present

## 2018-10-07 DIAGNOSIS — E785 Hyperlipidemia, unspecified: Secondary | ICD-10-CM | POA: Diagnosis not present

## 2018-10-07 DIAGNOSIS — D649 Anemia, unspecified: Secondary | ICD-10-CM | POA: Diagnosis not present

## 2018-10-07 DIAGNOSIS — R350 Frequency of micturition: Secondary | ICD-10-CM

## 2018-10-07 DIAGNOSIS — E039 Hypothyroidism, unspecified: Secondary | ICD-10-CM | POA: Diagnosis not present

## 2018-10-07 DIAGNOSIS — R3911 Hesitancy of micturition: Secondary | ICD-10-CM

## 2018-10-07 DIAGNOSIS — D539 Nutritional anemia, unspecified: Secondary | ICD-10-CM | POA: Diagnosis not present

## 2018-10-07 DIAGNOSIS — G4730 Sleep apnea, unspecified: Secondary | ICD-10-CM | POA: Diagnosis not present

## 2018-10-07 DIAGNOSIS — R5383 Other fatigue: Secondary | ICD-10-CM

## 2018-10-07 NOTE — Progress Notes (Signed)
Patient: Ray Robles Male    DOB: [birth year]   57 y.o.   MRN: [id number] Visit Date: 10/07/2018  Today's Provider: Lelon Huh, MD   Chief Complaint  Patient presents with  . Hypothyroidism  . Hyperlipidemia   Subjective:    Hyperlipidemia  This is a chronic problem. Recent lipid tests were reviewed and are high. Pertinent negatives include no chest pain, focal sensory loss, focal weakness, leg pain, myalgias or shortness of breath.  Thyroid Problem  Presents for follow-up (08/06/2018 Pt's Thyroid was overcorrected decreased Levothyroxine to 19mcg daily.) visit. Symptoms include depressed mood (Pt reports feeling more decreased then usual. ). Patient reports no anxiety, cold intolerance, constipation, diaphoresis, diarrhea, dry skin, fatigue, hair loss, heat intolerance, hoarse voice, leg swelling, menstrual problem, nail problem, palpitations, tremors, visual change, weight gain or weight loss. The symptoms have been stable. His past medical history is significant for hyperlipidemia.   His primary complaint today is he is up frequently at night to urinate. States he usually has hesitancy with urination. Has been using Bipap for the last month, but still having to get up every 1-2 hours to void. Had normal PSA in July    Lab Results  Component Value Date   CHOL 234 (H) 06/10/2018   Lab Results  Component Value Date   HDL 32 (L) 06/10/2018   Lab Results  Component Value Date   LDLCALC 151 (H) 06/10/2018   Lab Results  Component Value Date   TRIG 253 (H) 06/10/2018   Lab Results  Component Value Date   CHOLHDL 7.3 (H) 06/10/2018   No results found for: LDLDIRECT Lab Results  Component Value Date   TSH 0.38 (A) 07/29/2018   TSH 0.379 07/29/2018       Allergies  Allergen Reactions  . Ambien [Zolpidem Tartrate] Other (See Comments)    Reaction: Violent sleep  . Ibuprofen Other (See Comments)    Can't take because of liver  . Morphine Diarrhea and  Nausea And Vomiting  . Morphine And Related Diarrhea and Nausea And Vomiting  . Tylenol [Acetaminophen] Other (See Comments)    Can't take because of liver  . Xanax [Alprazolam] Other (See Comments)    Brings the ammonia levels up  . Zaleplon     seizures  . Zolpidem Other (See Comments)    Reaction: Violent Sleep     Current Outpatient Medications:  .  buprenorphine-naloxone (SUBOXONE) 2-0.5 mg SUBL SL tablet, Place 1-2 tablets See admin instructions under the tongue. Take 2 tablets by mouth in the morning, take 1 tablet by mouth midday and take 2 tablets by mouth at bedtime, Disp: , Rfl:  .  diphenoxylate-atropine (LOMOTIL) 2.5-0.025 MG per tablet, Take 2 tablets by mouth every 6 (six) hours as needed for diarrhea or loose stools. , Disp: , Rfl:  .  escitalopram (LEXAPRO) 5 MG tablet, Take 1 tablet (5 mg total) by mouth daily., Disp: 30 tablet, Rfl: 3 .  ezetimibe (ZETIA) 10 MG tablet, TAKE 1 TABLET BY MOUTH ONCE DAILY, Disp: 30 tablet, Rfl: 11 .  furosemide (LASIX) 40 MG tablet, Take 1 tablet (40 mg total) by mouth 2 (two) times daily. (Patient taking differently: Take 40 mg daily by mouth. ), Disp: 1 tablet, Rfl: 1 .  gabapentin (NEURONTIN) 300 MG capsule, Take 1 capsule (300 mg total) by mouth 2 (two) times daily., Disp: 60 capsule, Rfl: 12 .  hydrOXYzine (ATARAX/VISTARIL) 10 MG tablet, TAKE 1 OR 2 TABLETS  BY MOUTH AT BEDTIME, Disp: 30 tablet, Rfl: 5 .  ipratropium-albuterol (DUONEB) 0.5-2.5 (3) MG/3ML SOLN, Take 3 mLs by nebulization 4 (four) times daily., Disp: 360 mL, Rfl: 1 .  lactulose (CHRONULAC) 10 GM/15ML solution, TAKE 30 MLS BY MOUTH 3 TIMES DAILY (Patient taking differently: Take 20 gm by mouth daily), Disp: 240 mL, Rfl: 12 .  levothyroxine (SYNTHROID, LEVOTHROID) 50 MCG tablet, Take 1 tablet (50 mcg total) by mouth daily., Disp: 30 tablet, Rfl: 3 .  Melatonin 10 MG CAPS, Take 10 mg at bedtime by mouth., Disp: , Rfl:  .  metoprolol tartrate (LOPRESSOR) 25 MG tablet, TAKE 1  TABLET BY MOUTH TWICE DAILY, Disp: 60 tablet, Rfl: 12 .  metroNIDAZOLE (METROCREAM) 0.75 % cream, APPLY TOPICALLY AT BEDTIME, Disp: 45 g, Rfl: 3 .  nitroGLYCERIN (NITROSTAT) 0.4 MG SL tablet, Place 0.4 mg under the tongue every 5 (five) minutes as needed for chest pain., Disp: , Rfl:  .  nystatin cream (MYCOSTATIN), Apply 1 application 3 (three) times daily as needed topically (for rash). , Disp: , Rfl:  .  prazosin (MINIPRESS) 5 MG capsule, Take 1 capsule (5 mg total) by mouth at bedtime., Disp: 30 capsule, Rfl: 3 .  QUEtiapine (SEROQUEL) 300 MG tablet, Take 1 tablet (300 mg total) by mouth at bedtime., Disp: 30 tablet, Rfl: 3 .  ROZEREM 8 MG tablet, TAKE 1 TABLET BY MOUTH AT BEDTIME., Disp: 30 tablet, Rfl: 5 .  spironolactone (ALDACTONE) 50 MG tablet, Take 1 tablet (50 mg total) by mouth daily. (Patient taking differently: Take 50 mg by mouth 2 (two) times daily. ), Disp: 30 tablet, Rfl: 0 .  tamsulosin (FLOMAX) 0.4 MG CAPS capsule, TAKE 1 CAPSULE BY MOUTH ONCE EVERY EVENING, Disp: 30 capsule, Rfl: 5 .  triamcinolone cream (KENALOG) 0.1 %, Apply 1 application topically 2 (two) times daily as needed., Disp: 30 g, Rfl: 2 .  venlafaxine XR (EFFEXOR-XR) 150 MG 24 hr capsule, Take 1 capsule (150 mg total) by mouth daily with breakfast., Disp: 30 capsule, Rfl: 3  Review of Systems  Constitutional: Negative.  Negative for diaphoresis, fatigue, weight gain and weight loss.  HENT: Negative for hoarse voice.   Respiratory: Negative.  Negative for shortness of breath.   Cardiovascular: Negative.  Negative for chest pain and palpitations.  Gastrointestinal: Negative.  Negative for constipation and diarrhea.  Endocrine: Positive for polydipsia, polyphagia and polyuria. Negative for cold intolerance and heat intolerance.  Genitourinary: Negative for menstrual problem.  Musculoskeletal: Negative.  Negative for myalgias.  Neurological: Negative for dizziness, tremors, focal weakness, weakness,  light-headedness, numbness and headaches.  Psychiatric/Behavioral: The patient is not nervous/anxious.     Social History   Tobacco Use  . Smoking status: Never Smoker  . Smokeless tobacco: Never Used  Substance Use Topics  . Alcohol use: No    Alcohol/week: 0.0 standard drinks   Objective:   BP 132/84 (BP Location: Right Arm, Patient Position: Sitting, Cuff Size: Large)   Pulse 84   Temp (!) 97.4 F (36.3 C) (Oral)   Resp 16   Wt 259 lb (117.5 kg)   BMI 35.62 kg/m  Vitals:   10/07/18 1405  BP: 132/84  Pulse: 84  Resp: 16  Temp: (!) 97.4 F (36.3 C)  TempSrc: Oral  Weight: 259 lb (117.5 kg)     Physical Exam  General Appearance:    Alert, cooperative, no distress, obese  Eyes:    PERRL, conjunctiva/corneas clear, EOM's intact  Lungs:     Clear to auscultation bilaterally, respirations unlabored  Heart:    Regular rate and rhythm  Neurologic:   Awake, alert, oriented x 3. No apparent focal neurological           defect.           Assessment & Plan:     1. Hypothyroidism, unspecified type Stable on lowered dose of levothyroxine.  - TSH  2. Hyperlipidemia, unspecified hyperlipidemia type Tolerating ezetimibe well.  - Lipid panel  3. Other fatigue  - Testosterone,Free and Total  4. Urinary hesitancy  - Ambulatory referral to Urology  5. Anemia, unspecified type  - Vitamin B12  6. Nocturia   7. Urinary frequency  - Ambulatory referral to Urology  8. Deficiency anemia  - Vitamin B12  9. Sleep apnea, unspecified type Continue Bipap per pulmonology.        Lelon Huh, MD  [facility]

## 2018-10-15 DIAGNOSIS — D649 Anemia, unspecified: Secondary | ICD-10-CM | POA: Diagnosis not present

## 2018-10-15 DIAGNOSIS — E039 Hypothyroidism, unspecified: Secondary | ICD-10-CM | POA: Diagnosis not present

## 2018-10-15 DIAGNOSIS — E785 Hyperlipidemia, unspecified: Secondary | ICD-10-CM | POA: Diagnosis not present

## 2018-10-15 DIAGNOSIS — R5383 Other fatigue: Secondary | ICD-10-CM | POA: Diagnosis not present

## 2018-10-15 DIAGNOSIS — D539 Nutritional anemia, unspecified: Secondary | ICD-10-CM | POA: Diagnosis not present

## 2018-10-16 ENCOUNTER — Telehealth: Payer: Self-pay

## 2018-10-16 LAB — LIPID PANEL
CHOL/HDL RATIO: 5.8 ratio — AB (ref 0.0–5.0)
Cholesterol, Total: 192 mg/dL (ref 100–199)
HDL: 33 mg/dL — ABNORMAL LOW (ref >39)
LDL CALC: 127 mg/dL — AB (ref 0–99)
Triglycerides: 161 mg/dL — ABNORMAL HIGH (ref 0–149)
VLDL Cholesterol Cal: 32 mg/dL (ref 5–40)

## 2018-10-16 LAB — TESTOSTERONE,FREE AND TOTAL
TESTOSTERONE FREE: 5 pg/mL — AB (ref 7.2–24.0)
Testosterone: 301 ng/dL (ref 264–916)

## 2018-10-16 LAB — TSH: TSH: 2.71 u[IU]/mL (ref 0.450–4.500)

## 2018-10-16 LAB — VITAMIN B12: Vitamin B-12: 825 pg/mL (ref 232–1245)

## 2018-10-16 NOTE — Telephone Encounter (Signed)
LMTCB 10/16/2018  Thanks,   -Mickel Baas

## 2018-10-16 NOTE — Telephone Encounter (Signed)
-----   Message from Birdie Sons, MD sent at 10/16/2018  7:58 AM EST ----- Cholesterol is pretty well controlled at 192. Normal testosterone levels, b12 and thyroid functions. Continue current medications.  Check labs yearly.

## 2018-10-19 ENCOUNTER — Ambulatory Visit (INDEPENDENT_AMBULATORY_CARE_PROVIDER_SITE_OTHER): Payer: Medicare Other | Admitting: Psychiatry

## 2018-10-19 ENCOUNTER — Encounter: Payer: Self-pay | Admitting: Psychiatry

## 2018-10-19 DIAGNOSIS — F331 Major depressive disorder, recurrent, moderate: Secondary | ICD-10-CM

## 2018-10-19 MED ORDER — QUETIAPINE FUMARATE 300 MG PO TABS: 300.0000 mg | ORAL_TABLET | Freq: Every day | ORAL | 3 refills | Status: DC

## 2018-10-19 MED ORDER — ESCITALOPRAM OXALATE 10 MG PO TABS: 10.0000 mg | ORAL_TABLET | Freq: Every day | ORAL | 2 refills | Status: DC

## 2018-10-19 MED ORDER — VENLAFAXINE HCL ER 150 MG PO CP24: 150.0000 mg | ORAL_CAPSULE | Freq: Every day | ORAL | 3 refills | Status: DC

## 2018-10-19 MED ORDER — PRAZOSIN HCL 5 MG PO CAPS: 5.0000 mg | ORAL_CAPSULE | Freq: Every day | ORAL | 3 refills | Status: DC
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
## 2018-10-20 NOTE — Telephone Encounter (Signed)
Pt returned missed call.  Please call pt back. ° °Thanks, °[facility] °

## 2018-10-20 NOTE — Telephone Encounter (Signed)
LMTCB 10/20/2018  Thanks,   -Mickel Baas

## 2018-10-21 NOTE — Telephone Encounter (Signed)
Pt advised.   Thanks,   -Lashunda Greis  

## 2018-10-26 ENCOUNTER — Other Ambulatory Visit: Payer: Self-pay

## 2018-10-26 ENCOUNTER — Encounter: Payer: Self-pay | Admitting: *Deleted

## 2018-10-26 ENCOUNTER — Emergency Department
Admission: EM | Admit: 2018-10-26 | Discharge: 2018-10-26 | Disposition: A | Discharge status: Home / Self Care | Payer: Medicare Other | Attending: Emergency Medicine | Admitting: Emergency Medicine

## 2018-10-26 DIAGNOSIS — R05 Cough: Secondary | ICD-10-CM | POA: Diagnosis present

## 2018-10-26 DIAGNOSIS — R7303 Prediabetes: Secondary | ICD-10-CM | POA: Diagnosis not present

## 2018-10-26 DIAGNOSIS — Z79899 Other long term (current) drug therapy: Secondary | ICD-10-CM | POA: Insufficient documentation

## 2018-10-26 DIAGNOSIS — J069 Acute upper respiratory infection, unspecified: Secondary | ICD-10-CM | POA: Diagnosis not present

## 2018-10-26 DIAGNOSIS — J029 Acute pharyngitis, unspecified: Secondary | ICD-10-CM | POA: Diagnosis not present

## 2018-10-26 DIAGNOSIS — E039 Hypothyroidism, unspecified: Secondary | ICD-10-CM | POA: Diagnosis not present

## 2018-10-26 DIAGNOSIS — B9789 Other viral agents as the cause of diseases classified elsewhere: Secondary | ICD-10-CM | POA: Diagnosis not present

## 2018-10-26 DIAGNOSIS — I10 Essential (primary) hypertension: Secondary | ICD-10-CM | POA: Insufficient documentation

## 2018-10-26 MED ORDER — AMOXICILLIN-POT CLAVULANATE 875-125 MG PO TABS: 1.0000 | ORAL_TABLET | Freq: Two times a day (BID) | ORAL | 0 refills | Status: DC

## 2018-10-26 MED ORDER — PREDNISONE 20 MG PO TABS
60.0000 mg | ORAL_TABLET | Freq: Once | ORAL | Status: AC
  Administered 2018-10-26: 60 mg via ORAL
  Filled 2018-10-26: qty 3

## 2018-10-26 MED ORDER — PREDNISONE 10 MG PO TABS: 10.0000 mg | ORAL_TABLET | Freq: Every day | ORAL | 0 refills | Status: DC

## 2018-10-26 MED ORDER — ALBUTEROL SULFATE HFA 108 (90 BASE) MCG/ACT IN AERS: 2.0000 | INHALATION_SPRAY | Freq: Four times a day (QID) | RESPIRATORY_TRACT | 2 refills | Status: DC | PRN

## 2018-10-26 MED ORDER — AMOXICILLIN-POT CLAVULANATE 875-125 MG PO TABS
1.0000 | ORAL_TABLET | Freq: Once | ORAL | Status: AC
  Administered 2018-10-26: 1 via ORAL
  Filled 2018-10-26: qty 1

## 2018-10-26 NOTE — ED Provider Notes (Signed)
[place] EMERGENCY DEPARTMENT Provider Note   CSN: [id number] Arrival date & time: 10/26/18  1742     History   Chief Complaint Chief Complaint  Patient presents with  . Cough  . Sore Throat    HPI Ray Robles is a 57 y.o. male.  Presents to the emergency department for evaluation of cough, sore throat, body aches and headache.  Patient states he has had a severe sore throat with headache and body aches that started 4 days ago.  He is also had 2 days of nonproductive cough with slight wheezing.  He denies any fevers, chest pain or shortness of breath.  Tolerating p.o. well.  No known contacts with strep.  HPI  Past Medical History:  Diagnosis Date  . Anxiety   . Arthritis   . Cirrhosis ([place])   . Depression   . Dyspnea    doe  . Dysrhythmia   . GERD (gastroesophageal reflux disease)    food sticks in throat has esophagus stretched  . H/O bacterial pneumonia 11/11/2007   2008 and 2015   . Heart murmur   . Hepatitis C   . Hx of endoscopy    05/12/2014- [facility]. Dr Candace Cruise. Benign appearing stricture. Dilated the examination was otherwise normal.02/13/2012- Beniign appearing intrinsic mild stenosis at GE junction, successfully dialated  . Hypertension   . Hypothyroidism   . Liver disease   . Neuromuscular disorder ([place])    chronic pain peripheral neuropathy  . Pre-diabetes   . PTSD (post-traumatic stress disorder)     Patient Active Problem List   Diagnosis Date Noted  . Hyperlipidemia 08/06/2018  . Rosacea (suspected) 02/17/2018  . Inguinal hernia recurrent bilateral 09/02/2017  . Edema 06/20/2017  . Hypothyroid 06/06/2017  . Somnolence 02/12/2017  . Sciatica, right side 07/18/2016  . Actinic keratosis 01/29/2016  . Cirrhosis, non-alcoholic ([place]) 47/42/5956  . Abnormal LFTs 05/12/2015  . Ache in joint 05/12/2015  . Arthritis of knee 05/12/2015  . Chronic pain 05/12/2015  . Dysphagia 05/12/2015  . ED (erectile dysfunction) of organic  origin 05/12/2015  . Hypokalemia 05/12/2015  . Positive urine drug screen 05/12/2015  . Renal function impairment 05/12/2015  . Hernia, inguinal, right 05/12/2015  . Seborrheic keratosis 05/12/2015  . Hepatic encephalopathy ([place])   . Hypertension   . Sleep apnea   . Anxiety   . Arthritis 03/22/2015  . Migraine with aura 02/09/2010  . Herniated cervical disc 07/07/2007  . Lumbar disc herniation 07/07/2007  . Insomnia 06/10/2007  . Ascites 05/06/2007  . Esophageal reflux 04/01/2005  . Depression, major, recurrent, moderate ([place]) 12/02/1998  . Neuropathy, peripheral, autonomic, idiopathic 12/02/1998  . Hepatitis C 12/02/1988    Past Surgical History:  Procedure Laterality Date  . COLONOSCOPY WITH PROPOFOL N/A 05/12/2015   Procedure: COLONOSCOPY WITH PROPOFOL;  Surgeon: Hulen Luster, MD;  Location: [facility] ENDOSCOPY;  Service: Gastroenterology;  Laterality: N/A;  . edg    . HERNIA REPAIR  08/2012   Dr. Jamal Collin; [facility]  . INGUINAL HERNIA REPAIR Right 10/28/2017   Procedure: HERNIA REPAIR INGUINAL ADULT;  Surgeon: Leonie Green, MD;  Location: [facility] ORS;  Service: General;  Laterality: Right;        Home Medications    Prior to Admission medications   Medication Sig Start Date End Date Taking? Authorizing Provider  albuterol (PROVENTIL HFA;VENTOLIN HFA) 108 (90 Base) MCG/ACT inhaler Inhale 2 puffs into the lungs every 6 (six) hours as needed for wheezing or shortness of breath.  10/26/18   Duanne Guess, PA-C  amoxicillin-clavulanate (AUGMENTIN) 875-125 MG tablet Take 1 tablet by mouth every 12 (twelve) hours for 10 days. 10/26/18 11/05/18  Duanne Guess, PA-C  buprenorphine-naloxone (SUBOXONE) 2-0.5 mg SUBL SL tablet Place 1-2 tablets See admin instructions under the tongue. Take 2 tablets by mouth in the morning, take 1 tablet by mouth midday and take 2 tablets by mouth at bedtime 04/14/17   [provider]  diphenoxylate-atropine (LOMOTIL) 2.5-0.025 MG per tablet Take  2 tablets by mouth every 6 (six) hours as needed for diarrhea or loose stools.  02/24/15   [provider]  escitalopram (LEXAPRO) 10 MG tablet Take 1 tablet (10 mg total) by mouth daily. 10/19/18   Rainey Pines, MD  ezetimibe (ZETIA) 10 MG tablet TAKE 1 TABLET BY MOUTH ONCE DAILY 09/07/18   Birdie Sons, MD  furosemide (LASIX) 40 MG tablet Take 1 tablet (40 mg total) by mouth 2 (two) times daily. Patient taking differently: Take 40 mg daily by mouth.  06/20/17   Birdie Sons, MD  gabapentin (NEURONTIN) 300 MG capsule Take 1 capsule (300 mg total) by mouth 2 (two) times daily. 02/24/18   Birdie Sons, MD  ipratropium-albuterol (DUONEB) 0.5-2.5 (3) MG/3ML SOLN Take 3 mLs by nebulization 4 (four) times daily. 07/15/18   Laverle Hobby, MD  lactulose ([place]) 10 GM/15ML solution TAKE 30 MLS BY MOUTH 3 TIMES DAILY Patient taking differently: Take 20 gm by mouth daily 09/09/17   Birdie Sons, MD  levothyroxine (SYNTHROID, LEVOTHROID) 50 MCG tablet Take 1 tablet (50 mcg total) by mouth daily. 08/06/18   Birdie Sons, MD  Melatonin 10 MG CAPS Take 10 mg at bedtime by mouth.    [provider]  metoprolol tartrate (LOPRESSOR) 25 MG tablet TAKE 1 TABLET BY MOUTH TWICE DAILY 09/19/18   Birdie Sons, MD  metroNIDAZOLE (METROCREAM) 0.75 % cream APPLY TOPICALLY AT BEDTIME 06/09/18   Birdie Sons, MD  nitroGLYCERIN (NITROSTAT) 0.4 MG SL tablet Place 0.4 mg under the tongue every 5 (five) minutes as needed for chest pain.    [provider]  nystatin cream (MYCOSTATIN) Apply 1 application 3 (three) times daily as needed topically (for rash).     [provider]  prazosin (MINIPRESS) 5 MG capsule Take 1 capsule (5 mg total) by mouth at bedtime. 10/19/18   Rainey Pines, MD  predniSONE (DELTASONE) 10 MG tablet Take 1 tablet (10 mg total) by mouth daily. 6,5,4,3,2,1 six day taper 10/26/18   Duanne Guess, PA-C  QUEtiapine (SEROQUEL) 300 MG tablet Take  1 tablet (300 mg total) by mouth at bedtime. 10/19/18   Rainey Pines, MD  ROZEREM 8 MG tablet TAKE 1 TABLET BY MOUTH AT BEDTIME. 07/06/18   Birdie Sons, MD  spironolactone (ALDACTONE) 50 MG tablet Take 1 tablet (50 mg total) by mouth daily. Patient taking differently: Take 50 mg by mouth 2 (two) times daily.  02/12/17   Nicholes Mango, MD  tamsulosin (FLOMAX) 0.4 MG CAPS capsule TAKE 1 CAPSULE BY MOUTH ONCE EVERY EVENING 09/07/18   Birdie Sons, MD  triamcinolone cream (KENALOG) 0.1 % Apply 1 application topically 2 (two) times daily as needed. 08/06/18   Birdie Sons, MD  venlafaxine XR (EFFEXOR-XR) 150 MG 24 hr capsule Take 1 capsule (150 mg total) by mouth daily with breakfast. 10/19/18   Rainey Pines, MD    Family History Family History  Problem Relation Age of Onset  .  Cancer Mother   . Cancer Father   . Alcohol abuse Brother   . Alcohol abuse Brother   . Drug abuse Brother     Social History Social History   Tobacco Use  . Smoking status: Never Smoker  . Smokeless tobacco: Never Used  Substance Use Topics  . Alcohol use: No    Alcohol/week: 0.0 standard drinks  . Drug use: Not Currently    Types: Oxycodone    Comment: none since 3/18     Allergies   Ambien [zolpidem tartrate]; Ibuprofen; Morphine; Morphine and related; Tylenol [acetaminophen]; Xanax [alprazolam]; Zaleplon; and Zolpidem   Review of Systems Review of Systems  Constitutional: Negative for fever.  HENT: Positive for congestion, rhinorrhea and sore throat. Negative for sinus pressure and sinus pain.   Respiratory: Positive for cough. Negative for wheezing and stridor.   Gastrointestinal: Negative for diarrhea, nausea and vomiting.  Musculoskeletal: Positive for myalgias. Negative for arthralgias and neck stiffness.  Skin: Negative for rash.  Neurological: Positive for headaches. Negative for dizziness.     Physical Exam Updated Vital Signs BP (!) 127/94 (BP Location: Left Arm)   Pulse 90    Temp 98.5 F (36.9 C) (Oral)   Resp 20   Ht 5\' 10"  (1.778 m)   Wt 114.3 kg   SpO2 95%   BMI 36.16 kg/m   Physical Exam  Constitutional: He is oriented to person, place, and time. He appears well-developed and well-nourished. No distress.  HENT:  Head: Normocephalic and atraumatic.  Right Ear: Hearing, tympanic membrane, external ear and ear canal normal.  Left Ear: Hearing, tympanic membrane, external ear and ear canal normal.  Nose: Rhinorrhea present. No nasal septal hematoma. Right sinus exhibits no maxillary sinus tenderness and no frontal sinus tenderness. Left sinus exhibits no maxillary sinus tenderness and no frontal sinus tenderness.  Mouth/Throat: Uvula is midline. No trismus in the jaw. No uvula swelling. No oropharyngeal exudate, posterior oropharyngeal edema, posterior oropharyngeal erythema or tonsillar abscesses. Tonsillar exudate.  Eyes: Conjunctivae are normal.  Neck: Normal range of motion.  Cardiovascular: Normal rate, regular rhythm and normal heart sounds.  Pulmonary/Chest: Effort normal. No stridor. No respiratory distress. He has wheezes. He exhibits no tenderness.  Good air movement bilaterally, subtle wheezing noted no rhonchi or crackles  Abdominal: Soft. He exhibits no distension. There is no tenderness. There is no guarding.  Musculoskeletal: Normal range of motion.  Neurological: He is alert and oriented to person, place, and time.  Skin: Skin is warm and dry. No rash noted.  Psychiatric: He has a normal mood and affect. His behavior is normal. Judgment and thought content normal.     ED Treatments / Results  Labs (all labs ordered are listed, but only abnormal results are displayed) Labs Reviewed - No data to display  EKG None  Radiology No results found.  Procedures Procedures (including critical care time)  Medications Ordered in ED Medications - No data to display   Initial Impression / Assessment and Plan / ED Course  I have reviewed  the triage vital signs and the nursing notes.  Pertinent labs & imaging results that were available during my care of the patient were reviewed by me and considered in my medical decision making (see chart for details).     57 year old male with history and exam concerning for strep pharyngitis.  He also has some subtle wheezing with complaints of a mild cough.  Vital signs are stable.  He started on Augmentin and given  a prescription for albuterol and prednisone.  He understands signs symptoms return to the ED for.  Final Clinical Impressions(s) / ED Diagnoses   Final diagnoses:  Pharyngitis, unspecified etiology  Viral upper respiratory tract infection    ED Discharge Orders         Ordered    amoxicillin-clavulanate (AUGMENTIN) 875-125 MG tablet  Every 12 hours     10/26/18 1931    albuterol (PROVENTIL HFA;VENTOLIN HFA) 108 (90 Base) MCG/ACT inhaler  Every 6 hours PRN     10/26/18 1931    predniSONE (DELTASONE) 10 MG tablet  Daily     10/26/18 1931           Renata Caprice 10/26/18 1934    Orbie Pyo, MD 10/26/18 [phone]

## 2018-10-26 NOTE — ED Notes (Signed)
See triage note  Presents with sore throat and prod cough  States sxs' started yesterday. Afebrile on arrival

## 2018-10-26 NOTE — Discharge Instructions (Addendum)
Please take antibiotics as prescribed.  Use inhaler as needed.  Take prednisone as prescribed.  Make sure drinking lots of fluids.  Return to the ER for any worsening symptoms or urgent changes in health.

## 2018-10-26 NOTE — ED Triage Notes (Addendum)
Pt ambulatory to triage.  Pt has a sore throat and cough since yesterday.   States coughing up green phlegm.  Non smoker.  Pt alert.  Speech clear.

## 2018-11-02 ENCOUNTER — Ambulatory Visit
Admission: RE | Admit: 2018-11-02 | Discharge: 2018-11-02 | Disposition: A | Discharge status: Home / Self Care | Payer: Medicare Other | Source: Ambulatory Visit | Attending: Family Medicine | Admitting: Family Medicine

## 2018-11-02 ENCOUNTER — Ambulatory Visit (INDEPENDENT_AMBULATORY_CARE_PROVIDER_SITE_OTHER): Payer: Medicare Other | Admitting: Family Medicine

## 2018-11-02 ENCOUNTER — Encounter: Payer: Self-pay | Admitting: Family Medicine

## 2018-11-02 VITALS — BP 150/88 | HR 84 | Temp 98.5°F | Resp 22 | Wt 261.0 lb

## 2018-11-02 DIAGNOSIS — R05 Cough: Secondary | ICD-10-CM

## 2018-11-02 DIAGNOSIS — R059 Cough, unspecified: Secondary | ICD-10-CM

## 2018-11-02 DIAGNOSIS — J984 Other disorders of lung: Secondary | ICD-10-CM | POA: Insufficient documentation

## 2018-11-02 DIAGNOSIS — R509 Fever, unspecified: Secondary | ICD-10-CM

## 2018-11-02 DIAGNOSIS — R0602 Shortness of breath: Secondary | ICD-10-CM | POA: Diagnosis not present

## 2018-11-02 MED ORDER — LEVOFLOXACIN 500 MG PO TABS: 500.0000 mg | ORAL_TABLET | Freq: Every day | ORAL | 0 refills | Status: DC

## 2018-11-02 NOTE — Patient Instructions (Signed)
Go to the [facility] on [address] for chest Xray  

## 2018-11-02 NOTE — Progress Notes (Signed)
Patient: Ray Robles Male    DOB: 1961/02/20   57 y.o.   MRN: [id number] Visit Date: 11/02/2018  Today's Provider: Lelon Huh, MD   Chief Complaint  Patient presents with  . Follow-up   Subjective:    HPI  Follow up ER visit  Patient was seen in ER for Pharyngitis and Viral URI on 10/26/2018 at which time he had been having sore throat and cough for about 4 days.  Treatment for this included starting Augmentin for presumed strep throat and given prescription for Albuterol and Prednisone. He reports good compliance with treatment. He reports this condition is Worse. Patient states he still has wheezing, shortness of breath and body aches. He is also has a productive cough with green/ yellow/ blood tinged sputum.   -----------------------------------------------------------------------------      Allergies  Allergen Reactions  . Ambien [Zolpidem Tartrate] Other (See Comments)    Reaction: Violent sleep  . Ibuprofen Other (See Comments)    Can't take because of liver  . Morphine Diarrhea and Nausea And Vomiting  . Morphine And Related Diarrhea and Nausea And Vomiting  . Tylenol [Acetaminophen] Other (See Comments)    Can't take because of liver  . Xanax [Alprazolam] Other (See Comments)    Brings the ammonia levels up  . Zaleplon     seizures  . Zolpidem Other (See Comments)    Reaction: Violent Sleep     Current Outpatient Medications:  .  albuterol (PROVENTIL HFA;VENTOLIN HFA) 108 (90 Base) MCG/ACT inhaler, Inhale 2 puffs into the lungs every 6 (six) hours as needed for wheezing or shortness of breath., Disp: 1 Inhaler, Rfl: 2 .  amoxicillin-clavulanate (AUGMENTIN) 875-125 MG tablet, Take 1 tablet by mouth every 12 (twelve) hours for 10 days., Disp: 20 tablet, Rfl: 0 .  buprenorphine-naloxone (SUBOXONE) 2-0.5 mg SUBL SL tablet, Place 1-2 tablets See admin instructions under the tongue. Take 2 tablets by mouth in the morning, take 1 tablet by mouth midday  and take 2 tablets by mouth at bedtime, Disp: , Rfl:  .  diphenoxylate-atropine (LOMOTIL) 2.5-0.025 MG per tablet, Take 2 tablets by mouth every 6 (six) hours as needed for diarrhea or loose stools. , Disp: , Rfl:  .  escitalopram (LEXAPRO) 10 MG tablet, Take 1 tablet (10 mg total) by mouth daily., Disp: 30 tablet, Rfl: 2 .  ezetimibe (ZETIA) 10 MG tablet, TAKE 1 TABLET BY MOUTH ONCE DAILY, Disp: 30 tablet, Rfl: 11 .  furosemide (LASIX) 40 MG tablet, Take 1 tablet (40 mg total) by mouth 2 (two) times daily. (Patient taking differently: Take 40 mg daily by mouth. ), Disp: 1 tablet, Rfl: 1 .  gabapentin (NEURONTIN) 300 MG capsule, Take 1 capsule (300 mg total) by mouth 2 (two) times daily., Disp: 60 capsule, Rfl: 12 .  ipratropium-albuterol (DUONEB) 0.5-2.5 (3) MG/3ML SOLN, Take 3 mLs by nebulization 4 (four) times daily., Disp: 360 mL, Rfl: 1 .  lactulose (CHRONULAC) 10 GM/15ML solution, TAKE 30 MLS BY MOUTH 3 TIMES DAILY (Patient taking differently: Take 20 gm by mouth daily), Disp: 240 mL, Rfl: 12 .  levothyroxine (SYNTHROID, LEVOTHROID) 50 MCG tablet, Take 1 tablet (50 mcg total) by mouth daily., Disp: 30 tablet, Rfl: 3 .  Melatonin 10 MG CAPS, Take 10 mg at bedtime by mouth., Disp: , Rfl:  .  metoprolol tartrate (LOPRESSOR) 25 MG tablet, TAKE 1 TABLET BY MOUTH TWICE DAILY, Disp: 60 tablet, Rfl: 12 .  metroNIDAZOLE (METROCREAM) 0.75 %  cream, APPLY TOPICALLY AT BEDTIME, Disp: 45 g, Rfl: 3 .  nitroGLYCERIN (NITROSTAT) 0.4 MG SL tablet, Place 0.4 mg under the tongue every 5 (five) minutes as needed for chest pain., Disp: , Rfl:  .  nystatin cream (MYCOSTATIN), Apply 1 application 3 (three) times daily as needed topically (for rash). , Disp: , Rfl:  .  prazosin (MINIPRESS) 5 MG capsule, Take 1 capsule (5 mg total) by mouth at bedtime., Disp: 30 capsule, Rfl: 3 .  predniSONE (DELTASONE) 10 MG tablet, Take 1 tablet (10 mg total) by mouth daily. 6,5,4,3,2,1 six day taper, Disp: 21 tablet, Rfl: 0 .   QUEtiapine (SEROQUEL) 300 MG tablet, Take 1 tablet (300 mg total) by mouth at bedtime., Disp: 30 tablet, Rfl: 3 .  ROZEREM 8 MG tablet, TAKE 1 TABLET BY MOUTH AT BEDTIME., Disp: 30 tablet, Rfl: 5 .  spironolactone (ALDACTONE) 50 MG tablet, Take 1 tablet (50 mg total) by mouth daily. (Patient taking differently: Take 50 mg by mouth 2 (two) times daily. ), Disp: 30 tablet, Rfl: 0 .  tamsulosin (FLOMAX) 0.4 MG CAPS capsule, TAKE 1 CAPSULE BY MOUTH ONCE EVERY EVENING, Disp: 30 capsule, Rfl: 5 .  triamcinolone cream (KENALOG) 0.1 %, Apply 1 application topically 2 (two) times daily as needed., Disp: 30 g, Rfl: 2 .  venlafaxine XR (EFFEXOR-XR) 150 MG 24 hr capsule, Take 1 capsule (150 mg total) by mouth daily with breakfast., Disp: 30 capsule, Rfl: 3  Review of Systems  Constitutional: Positive for chills, diaphoresis, fatigue and fever. Negative for appetite change.  HENT: Positive for congestion.   Respiratory: Positive for cough, shortness of breath and wheezing. Negative for chest tightness.   Cardiovascular: Negative for chest pain and palpitations.  Gastrointestinal: Negative for abdominal pain, nausea and vomiting.    Social History   Tobacco Use  . Smoking status: Never Smoker  . Smokeless tobacco: Never Used  Substance Use Topics  . Alcohol use: No    Alcohol/week: 0.0 standard drinks   Objective:   BP (!) 150/88 (BP Location: Left Arm, Patient Position: Sitting, Cuff Size: Large)   Pulse 84   Temp 98.5 F (36.9 C) (Oral)   Resp (!) 22   Wt 261 lb (118.4 kg)   SpO2 95% Comment: room air  BMI 37.45 kg/m  Vitals:   11/02/18 1512  BP: (!) 150/88  Pulse: 84  Resp: (!) 22  Temp: 98.5 F (36.9 C)  TempSrc: Oral  SpO2: 95%  Weight: 261 lb (118.4 kg)     Physical Exam  General Appearance:    Alert, cooperative, no distress  HENT:   bilateral TM normal without fluid or infection, neck without nodes, pharynx erythematous without exudate, sinuses nontender and nasal mucosa  congested  Eyes:    PERRL, conjunctiva/corneas clear, EOM's intact       Lungs:     Occasional expiratory wheeze, no rales, , respirations unlabored  Heart:    Regular rate and rhythm  Neurologic:   Awake, alert, oriented x 3. No apparent focal neurological           defect.           Assessment & Plan:     1. Cough - DG Chest 2 View; Future  2. Fever, unspecified fever cause  - DG Chest 2 View; Future  Worsening sx since starting Augmentin last week. Change to levofloxacin today.       Lelon Huh, MD  [facility]

## 2018-11-03 ENCOUNTER — Telehealth: Payer: Self-pay

## 2018-11-03 NOTE — Telephone Encounter (Signed)
Patient wife Tye [place] (per DRP) returned call and was advised.

## 2018-11-03 NOTE — Telephone Encounter (Signed)
-----   Message from Birdie Sons, MD sent at 11/03/2018  7:41 AM EST ----- Chest xray shows bronchitis. Should improve on levofloxacin prescribed yesterday. Call if sx worsen, or if not much better when finished with antibiotic.

## 2018-11-03 NOTE — Telephone Encounter (Signed)
LVMTRC 

## 2018-11-07 ENCOUNTER — Other Ambulatory Visit: Payer: Self-pay | Admitting: Family Medicine

## 2018-11-10 ENCOUNTER — Ambulatory Visit: Payer: Self-pay | Admitting: Urology

## 2018-11-17 ENCOUNTER — Encounter: Payer: Self-pay | Admitting: Family Medicine

## 2018-11-17 ENCOUNTER — Ambulatory Visit
Admission: RE | Admit: 2018-11-17 | Discharge: 2018-11-17 | Disposition: A | Discharge status: Home / Self Care | Payer: Medicare Other | Source: Ambulatory Visit | Attending: Family Medicine | Admitting: Family Medicine

## 2018-11-17 ENCOUNTER — Ambulatory Visit
Admission: RE | Admit: 2018-11-17 | Discharge: 2018-11-17 | Disposition: A | Discharge status: Home / Self Care | Payer: Medicare Other | Attending: Family Medicine | Admitting: Family Medicine

## 2018-11-17 ENCOUNTER — Ambulatory Visit (INDEPENDENT_AMBULATORY_CARE_PROVIDER_SITE_OTHER): Payer: Medicare Other | Admitting: Family Medicine

## 2018-11-17 VITALS — BP 130/70 | HR 92 | Temp 98.1°F | Resp 16 | Wt 260.0 lb

## 2018-11-17 DIAGNOSIS — R05 Cough: Secondary | ICD-10-CM | POA: Diagnosis not present

## 2018-11-17 DIAGNOSIS — J209 Acute bronchitis, unspecified: Secondary | ICD-10-CM

## 2018-11-17 DIAGNOSIS — R0602 Shortness of breath: Secondary | ICD-10-CM | POA: Diagnosis not present

## 2018-11-17 MED ORDER — AZITHROMYCIN 250 MG PO TABS: ORAL_TABLET | ORAL | 0 refills | Status: DC

## 2018-11-17 MED ORDER — IPRATROPIUM-ALBUTEROL 0.5-2.5 (3) MG/3ML IN SOLN: 3.0000 mL | Freq: Once | RESPIRATORY_TRACT | Status: DC

## 2018-11-17 NOTE — Progress Notes (Signed)
Patient: Ray Robles Male    DOB: 1961-09-01   57 y.o.   MRN: [id number] Visit Date: 11/17/2018  Today's Provider: Vernie Murders, PA   Chief Complaint  Patient presents with  . Cough   Subjective:    Cough and Fever From 11/02/2018-saw Dr. [name]. Patient was given levofloxacin and chest x-ray was done showing bronchitis.   Patient states he has completed antibiotics and symptoms have not improved. Patient states cough is not any better. Patient is still having chest congestion, shortness of breath and wheezing.   Past Medical History:  Diagnosis Date  . Anxiety   . Arthritis   . Cirrhosis ([place])   . Depression   . Dyspnea    doe  . Dysrhythmia   . GERD (gastroesophageal reflux disease)    food sticks in throat has esophagus stretched  . H/O bacterial pneumonia 11/11/2007   2008 and 2015   . Heart murmur   . Hepatitis C   . Hx of endoscopy    05/12/2014- [facility]. Dr Candace Cruise. Benign appearing stricture. Dilated the examination was otherwise normal.02/13/2012- Beniign appearing intrinsic mild stenosis at GE junction, successfully dialated  . Hypertension   . Hypothyroidism   . Liver disease   . Neuromuscular disorder ([place])    chronic pain peripheral neuropathy  . Pre-diabetes   . PTSD (post-traumatic stress disorder)    Past Surgical History:  Procedure Laterality Date  . COLONOSCOPY WITH PROPOFOL N/A 05/12/2015   Procedure: COLONOSCOPY WITH PROPOFOL;  Surgeon: Hulen Luster, MD;  Location: [facility] ENDOSCOPY;  Service: Gastroenterology;  Laterality: N/A;  . edg    . HERNIA REPAIR  08/2012   Dr. Jamal Collin; [facility]  . INGUINAL HERNIA REPAIR Right 10/28/2017   Procedure: HERNIA REPAIR INGUINAL ADULT;  Surgeon: Leonie Green, MD;  Location: [facility] ORS;  Service: General;  Laterality: Right;   Family History  Problem Relation Age of Onset  . Cancer Mother   . Cancer Father   . Alcohol abuse Brother   . Alcohol abuse Brother   . Drug abuse Brother     Allergies    Allergen Reactions  . Ambien [Zolpidem Tartrate] Other (See Comments)    Reaction: Violent sleep  . Ibuprofen Other (See Comments)    Can't take because of liver  . Morphine Diarrhea and Nausea And Vomiting  . Morphine And Related Diarrhea and Nausea And Vomiting  . Tylenol [Acetaminophen] Other (See Comments)    Can't take because of liver  . Xanax [Alprazolam] Other (See Comments)    Brings the ammonia levels up  . Zaleplon     seizures  . Zolpidem Other (See Comments)    Reaction: Violent Sleep    Current Outpatient Medications:  .  albuterol (PROVENTIL HFA;VENTOLIN HFA) 108 (90 Base) MCG/ACT inhaler, Inhale 2 puffs into the lungs every 6 (six) hours as needed for wheezing or shortness of breath., Disp: 1 Inhaler, Rfl: 2 .  buprenorphine-naloxone (SUBOXONE) 2-0.5 mg SUBL SL tablet, Place 1-2 tablets See admin instructions under the tongue. Take 2 tablets by mouth in the morning, take 1 tablet by mouth midday and take 2 tablets by mouth at bedtime, Disp: , Rfl:  .  diphenoxylate-atropine (LOMOTIL) 2.5-0.025 MG per tablet, Take 2 tablets by mouth every 6 (six) hours as needed for diarrhea or loose stools. , Disp: , Rfl:  .  escitalopram (LEXAPRO) 10 MG tablet, Take 1 tablet (10 mg total) by mouth daily., Disp: 30 tablet, Rfl:  2 .  ezetimibe (ZETIA) 10 MG tablet, TAKE 1 TABLET BY MOUTH ONCE DAILY, Disp: 30 tablet, Rfl: 11 .  furosemide (LASIX) 40 MG tablet, Take 1 tablet (40 mg total) by mouth 2 (two) times daily. (Patient taking differently: Take 40 mg daily by mouth. ), Disp: 1 tablet, Rfl: 1 .  gabapentin (NEURONTIN) 300 MG capsule, Take 1 capsule (300 mg total) by mouth 2 (two) times daily., Disp: 60 capsule, Rfl: 12 .  ipratropium-albuterol (DUONEB) 0.5-2.5 (3) MG/3ML SOLN, Take 3 mLs by nebulization 4 (four) times daily., Disp: 360 mL, Rfl: 1 .  lactulose (CHRONULAC) 10 GM/15ML solution, TAKE 30 MLS BY MOUTH 3 TIMES DAILY (Patient taking differently: Take 20 gm by mouth daily),  Disp: 240 mL, Rfl: 12 .  levothyroxine (SYNTHROID, LEVOTHROID) 50 MCG tablet, Take 1 tablet (50 mcg total) by mouth daily., Disp: 30 tablet, Rfl: 3 .  Melatonin 10 MG CAPS, Take 10 mg at bedtime by mouth., Disp: , Rfl:  .  metoprolol tartrate (LOPRESSOR) 25 MG tablet, TAKE 1 TABLET BY MOUTH TWICE DAILY, Disp: 60 tablet, Rfl: 12 .  metroNIDAZOLE (METROCREAM) 0.75 % cream, APPLY TOPICALLY AT BEDTIME, Disp: 45 g, Rfl: 3 .  nitroGLYCERIN (NITROSTAT) 0.4 MG SL tablet, Place 0.4 mg under the tongue every 5 (five) minutes as needed for chest pain., Disp: , Rfl:  .  nystatin cream (MYCOSTATIN), Apply 1 application 3 (three) times daily as needed topically (for rash). , Disp: , Rfl:  .  prazosin (MINIPRESS) 5 MG capsule, Take 1 capsule (5 mg total) by mouth at bedtime., Disp: 30 capsule, Rfl: 3 .  QUEtiapine (SEROQUEL) 300 MG tablet, Take 1 tablet (300 mg total) by mouth at bedtime., Disp: 30 tablet, Rfl: 3 .  ROZEREM 8 MG tablet, TAKE 1 TABLET BY MOUTH AT BEDTIME., Disp: 30 tablet, Rfl: 5 .  spironolactone (ALDACTONE) 50 MG tablet, Take 1 tablet (50 mg total) by mouth daily. (Patient taking differently: Take 50 mg by mouth 2 (two) times daily. ), Disp: 30 tablet, Rfl: 0 .  tamsulosin (FLOMAX) 0.4 MG CAPS capsule, TAKE 1 CAPSULE BY MOUTH ONCE EVERY EVENING, Disp: 30 capsule, Rfl: 5 .  triamcinolone cream (KENALOG) 0.1 %, APPLY TOPICALLY TWICE DAILY AS NEEDED, Disp: 30 g, Rfl: 2 .  venlafaxine XR (EFFEXOR-XR) 150 MG 24 hr capsule, Take 1 capsule (150 mg total) by mouth daily with breakfast., Disp: 30 capsule, Rfl: 3 .  levofloxacin (LEVAQUIN) 500 MG tablet, Take 1 tablet (500 mg total) by mouth daily. (Patient not taking: Reported on 11/17/2018), Disp: 7 tablet, Rfl: 0 .  predniSONE (DELTASONE) 10 MG tablet, Take 1 tablet (10 mg total) by mouth daily. 6,5,4,3,2,1 six day taper (Patient not taking: Reported on 11/17/2018), Disp: 21 tablet, Rfl: 0  Review of Systems  Constitutional: Negative for appetite  change, chills and fever.  HENT: Positive for congestion.   Respiratory: Positive for cough, shortness of breath and wheezing. Negative for chest tightness.   Cardiovascular: Negative for chest pain and palpitations.  Gastrointestinal: Negative for abdominal pain, nausea and vomiting.   Social History   Tobacco Use  . Smoking status: Never Smoker  . Smokeless tobacco: Never Used  Substance Use Topics  . Alcohol use: No    Alcohol/week: 0.0 standard drinks     Objective:   BP 130/70 (BP Location: Right Arm, Patient Position: Sitting, Cuff Size: Large)   Pulse 92   Temp 98.1 F (36.7 C) (Oral)   Resp 16   Wt 260  lb (117.9 kg)   SpO2 92%   BMI 37.31 kg/m  Vitals:   11/17/18 1429  BP: 130/70  Pulse: 92  Resp: 16  Temp: 98.1 F (36.7 C)  TempSrc: Oral  SpO2: 92%  Weight: 260 lb (117.9 kg)   Physical Exam Constitutional:      General: He is not in acute distress.    Appearance: He is well-developed.  HENT:     Head: Normocephalic and atraumatic.     Right Ear: Hearing, tympanic membrane and ear canal normal.     Left Ear: Hearing, tympanic membrane and ear canal normal.     Nose: Nose normal. No congestion or rhinorrhea.     Mouth/Throat:     Pharynx: Oropharynx is clear.  Eyes:     General: Lids are normal. No scleral icterus.       Right eye: No discharge.        Left eye: No discharge.     Conjunctiva/sclera: Conjunctivae normal.  Pulmonary:     Effort: Respiratory distress present.     Breath sounds: Wheezing and rhonchi present.  Abdominal:     General: Bowel sounds are normal.     Palpations: Abdomen is soft.  Musculoskeletal: Normal range of motion.  Skin:    Findings: No lesion or rash.  Neurological:     Mental Status: He is alert and oriented to person, place, and time.  Psychiatric:        Speech: Speech normal.        Behavior: Behavior normal.        Thought Content: Thought content normal.       Assessment & Plan    1. Bronchitis with  bronchospasm Started having pharyngitis with cough on 10-26-18 and treated with Augmentin for presumed strep throat and givne Albuterol with prednisone at the ER. Continued to have worsening wheezing and dyspnea with yellow-greenish sputum when rechecked on 11-02-18 by Dr. [name]. Antibiotic was changed to Levoquin 500 mg qd and checked CXR (no pneumonia).Developed a hacking cough, with "watery" sputum and wheezing over the past 3-4 days. Given Duoneb by nebulizer with a few wet rhonchi. Added Z-pak, Mucinex DM prn and ordered repeat CXR with CBC. May use Albuterol prn and be sure he finishes all the prednisone. May need a follow up appointment with Dr. Ashby Dawes (pulmonologist) if no better in 5-6 days. - ipratropium-albuterol (DUONEB) 0.5-2.5 (3) MG/3ML nebulizer solution 3 mL - CBC with Differential/Platelet - DG Chest 2 View - azithromycin (ZITHROMAX) 250 MG tablet; Take 2 tablets by mouth today then one tablet daily for 4 days.  Dispense: 6 tablet; Refill: [place], PA  [facility]

## 2018-11-18 ENCOUNTER — Ambulatory Visit: Payer: Self-pay | Admitting: Urology

## 2018-11-18 LAB — CBC WITH DIFFERENTIAL/PLATELET
BASOS: 0 %
Basophils Absolute: 0 10*3/uL (ref 0.0–0.2)
EOS (ABSOLUTE): 0.2 10*3/uL (ref 0.0–0.4)
EOS: 5 %
HEMATOCRIT: 34.2 % — AB (ref 37.5–51.0)
Hemoglobin: 11.4 g/dL — ABNORMAL LOW (ref 13.0–17.7)
Immature Grans (Abs): 0 10*3/uL (ref 0.0–0.1)
Immature Granulocytes: 0 %
Lymphocytes Absolute: 1.3 10*3/uL (ref 0.7–3.1)
Lymphs: 33 %
MCH: 28.8 pg (ref 26.6–33.0)
MCHC: 33.3 g/dL (ref 31.5–35.7)
MCV: 86 fL (ref 79–97)
MONOS ABS: 0.2 10*3/uL (ref 0.1–0.9)
Monocytes: 5 %
Neutrophils Absolute: 2.3 10*3/uL (ref 1.4–7.0)
Neutrophils: 57 %
Platelets: 127 10*3/uL — ABNORMAL LOW (ref 150–450)
RBC: 3.96 x10E6/uL — ABNORMAL LOW (ref 4.14–5.80)
RDW: 12.6 % (ref 12.3–15.4)
WBC: 4.1 10*3/uL (ref 3.4–10.8)

## 2018-11-19 ENCOUNTER — Telehealth: Payer: Self-pay

## 2018-11-19 NOTE — Telephone Encounter (Signed)
Patient is contacting office in regards to lab results. KW

## 2018-11-19 NOTE — Telephone Encounter (Signed)
I'm not sure why this was sent to me since patient was seen by Simona Huh and labs were ordered by him,  and I am not even in the office, But his CBC was normal.

## 2018-11-19 NOTE — Telephone Encounter (Signed)
Please advise results? 

## 2018-11-20 NOTE — Telephone Encounter (Signed)
Sent in error

## 2018-11-24 ENCOUNTER — Telehealth: Payer: Self-pay | Admitting: Family Medicine

## 2018-11-24 NOTE — Telephone Encounter (Signed)
I left a message asking the pt to call and schedule AWV-I with McKenzie. VDM (DD)

## 2018-12-05 ENCOUNTER — Other Ambulatory Visit: Payer: Self-pay | Admitting: Family Medicine

## 2018-12-05 DIAGNOSIS — E039 Hypothyroidism, unspecified: Secondary | ICD-10-CM

## 2018-12-07 ENCOUNTER — Ambulatory Visit (INDEPENDENT_AMBULATORY_CARE_PROVIDER_SITE_OTHER): Payer: Medicare Other | Admitting: Urology

## 2018-12-07 ENCOUNTER — Encounter: Payer: Self-pay | Admitting: Urology

## 2018-12-07 VITALS — BP 109/66 | HR 91 | Ht 71.0 in | Wt 251.8 lb

## 2018-12-07 DIAGNOSIS — R3911 Hesitancy of micturition: Secondary | ICD-10-CM | POA: Diagnosis not present

## 2018-12-07 DIAGNOSIS — R350 Frequency of micturition: Secondary | ICD-10-CM | POA: Diagnosis not present

## 2018-12-07 LAB — BLADDER SCAN AMB NON-IMAGING

## 2018-12-07 NOTE — Progress Notes (Signed)
12/07/2018 12:23 PM   GARVIN ELLENA [birth year] [id number]  Referring provider: Birdie Sons, [address] [place], [place] [address]  CC: Urinary urgency/frequency, nocturia  HPI: I saw Mr. Markos in urology clinic today in consultation for urinary urgency, frequency, and nocturia from Dr. [name].  He is a 58 year old male with numerous comorbidities including cirrhosis, chronic pain with history of narcotic addiction, anxiety and depression, and sleep apnea on BiPAP.  He also takes high-dose Lasix.  His primary complaint is urinary frequency during the day and night, with nocturia 5-6 times per night.  He denies any gross hematuria, flank pain, or dysuria.  He denies family history of prostate cancer or nephrolithiasis.  He has never been seen by a urologist before.  PSA 06/2018 was 0.1, and prostate measures 8 cc on recent cross-sectional imaging.  He drinks 4 to 5 cans of Coke per day, which she has decreased from 7 to 8/day.  He reports dry mouth secondary to his extensive medication list.  He was unable to tolerate CPAP for his sleep apnea, but has recently transitioned to BiPAP and reports he is compliant in using this.  There are no aggravating or alleviating factors.  Severity is moderate.  He takes Flomax at baseline.  He was unable to give a urine sample in clinic today.  Bladder scan is 19 cc.   PMH: Past Medical History:  Diagnosis Date  . Anxiety   . Arthritis   . Cirrhosis ([place])   . Depression   . Dyspnea    doe  . Dysrhythmia   . GERD (gastroesophageal reflux disease)    food sticks in throat has esophagus stretched  . H/O bacterial pneumonia 11/11/2007   2008 and 2015   . Heart murmur   . Hepatitis C   . Hx of endoscopy    05/12/2014- [facility]. Dr Candace Cruise. Benign appearing stricture. Dilated the examination was otherwise normal.02/13/2012- Beniign appearing intrinsic mild stenosis at GE junction, successfully dialated  . Hypertension   .  Hypothyroidism   . Liver disease   . Neuromuscular disorder ([place])    chronic pain peripheral neuropathy  . Pre-diabetes   . PTSD (post-traumatic stress disorder)     Surgical History: Past Surgical History:  Procedure Laterality Date  . COLONOSCOPY WITH PROPOFOL N/A 05/12/2015   Procedure: COLONOSCOPY WITH PROPOFOL;  Surgeon: Hulen Luster, MD;  Location: [facility] ENDOSCOPY;  Service: Gastroenterology;  Laterality: N/A;  . edg    . HERNIA REPAIR  08/2012   Dr. Jamal Collin; [facility]  . INGUINAL HERNIA REPAIR Right 10/28/2017   Procedure: HERNIA REPAIR INGUINAL ADULT;  Surgeon: Leonie Green, MD;  Location: [facility] ORS;  Service: General;  Laterality: Right;    Allergies:  Allergies  Allergen Reactions  . Ambien [Zolpidem Tartrate] Other (See Comments)    Reaction: Violent sleep  . Ibuprofen Other (See Comments)    Can't take because of liver  . Morphine Diarrhea and Nausea And Vomiting  . Morphine And Related Diarrhea and Nausea And Vomiting  . Tylenol [Acetaminophen] Other (See Comments)    Can't take because of liver  . Xanax [Alprazolam] Other (See Comments)    Brings the ammonia levels up  . Zaleplon     seizures  . Zolpidem Other (See Comments)    Reaction: Violent Sleep    Family History: Family History  Problem Relation Age of Onset  . Cancer Mother   . Cancer Father   . Alcohol abuse Brother   .  Alcohol abuse Brother   . Drug abuse Brother     Social History:  reports that he has never smoked. He has never used smokeless tobacco. He reports previous drug use. Drug: Oxycodone. He reports that he does not drink alcohol.  ROS: Please see flowsheet from today's date for complete review of systems.  Physical Exam: BP 109/66 (BP Location: Left Arm, Patient Position: Sitting, Cuff Size: Large)   Pulse 91   Ht 5\' 11"  (1.803 m)   Wt 251 lb 12.8 oz (114.2 kg)   BMI 35.12 kg/m    Constitutional:  Alert and oriented, No acute distress. Cardiovascular: No clubbing,  cyanosis, or edema. Respiratory: Normal respiratory effort, no increased work of breathing. GI: Abdomen is soft, nontender, nondistended, no abdominal masses GU: No CVA tendernessLymph: No cervical or inguinal lymphadenopathy. Skin: No rashes, bruises or suspicious lesions. Neurologic: Grossly intact, no focal deficits, moving all 4 extremities. Psychiatric: Normal mood and affect.  Laboratory Data: PSA 0.1   Pertinent Imaging: I have personally reviewed the CT from 09/2017.  No hydronephrosis, nephrolithiasis.  Prostate measures 8 cc  Assessment & Plan:   In summary, Mr. Boorman is a 58 year old male with numerous comorbidities who presents with primary complaint of nocturia 5-6 times per night and urgency and frequency during the day.  He has recently started BiPAP for sleep apnea, and drinks 4 to 6 cans of soda per day.  We discussed at length the relationship between coffee, soda, and tea and bladder irritation and overactive bladder symptoms.  We also discussed the close relationship between sleep apnea and nocturia.  I recommended behavioral strategies including minimizing soda in the diet, minimizing fluids in the evening after 7 PM, and double voiding prior to bed.  RTC 1 year for symptom check  Billey Co, MD  [place] [address] [place], [place] [address] [phone]

## 2018-12-21 ENCOUNTER — Ambulatory Visit (INDEPENDENT_AMBULATORY_CARE_PROVIDER_SITE_OTHER): Payer: Medicare Other | Admitting: Psychiatry

## 2018-12-21 ENCOUNTER — Other Ambulatory Visit: Payer: Self-pay

## 2018-12-21 ENCOUNTER — Encounter: Payer: Self-pay | Admitting: Psychiatry

## 2018-12-21 VITALS — BP 143/89 | HR 86 | Temp 97.7°F | Wt 256.4 lb

## 2018-12-21 DIAGNOSIS — F331 Major depressive disorder, recurrent, moderate: Secondary | ICD-10-CM

## 2018-12-21 MED ORDER — PRAZOSIN HCL 5 MG PO CAPS: 5.0000 mg | ORAL_CAPSULE | Freq: Every day | ORAL | 3 refills | Status: DC

## 2018-12-21 MED ORDER — MIRTAZAPINE 15 MG PO TABS: 15.0000 mg | ORAL_TABLET | Freq: Every day | ORAL | 2 refills | Status: DC

## 2018-12-21 MED ORDER — VENLAFAXINE HCL ER 150 MG PO CP24: 150.0000 mg | ORAL_CAPSULE | Freq: Every day | ORAL | 3 refills | Status: DC

## 2018-12-21 MED ORDER — QUETIAPINE FUMARATE 300 MG PO TABS: 300.0000 mg | ORAL_TABLET | Freq: Every day | ORAL | 3 refills | Status: DC
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
## 2019-01-25 DIAGNOSIS — K7469 Other cirrhosis of liver: Secondary | ICD-10-CM | POA: Diagnosis not present

## 2019-01-25 DIAGNOSIS — K766 Portal hypertension: Secondary | ICD-10-CM | POA: Diagnosis not present

## 2019-01-25 DIAGNOSIS — R188 Other ascites: Secondary | ICD-10-CM | POA: Diagnosis not present

## 2019-01-25 DIAGNOSIS — K7460 Unspecified cirrhosis of liver: Secondary | ICD-10-CM | POA: Diagnosis not present

## 2019-01-26 ENCOUNTER — Telehealth: Payer: Self-pay

## 2019-01-26 ENCOUNTER — Other Ambulatory Visit: Payer: Self-pay | Admitting: Family Medicine

## 2019-01-26 DIAGNOSIS — G4700 Insomnia, unspecified: Secondary | ICD-10-CM

## 2019-01-26 NOTE — Telephone Encounter (Signed)
LM for patient to call re: [facility] wanting compliance notes on BiPap, stating Medicare has stopped paying for Pap. Question as to whether or not patient is even still wearing machine.

## 2019-01-27 DIAGNOSIS — Z5181 Encounter for therapeutic drug level monitoring: Secondary | ICD-10-CM | POA: Diagnosis not present

## 2019-01-27 DIAGNOSIS — Z87898 Personal history of other specified conditions: Secondary | ICD-10-CM | POA: Diagnosis not present

## 2019-01-27 DIAGNOSIS — K7460 Unspecified cirrhosis of liver: Secondary | ICD-10-CM | POA: Diagnosis not present

## 2019-01-29 NOTE — Telephone Encounter (Signed)
Left another message to call office back.

## 2019-02-01 ENCOUNTER — Telehealth: Payer: Self-pay | Admitting: Internal Medicine

## 2019-02-01 NOTE — Telephone Encounter (Signed)
lmtcb for pt.  

## 2019-02-01 NOTE — Telephone Encounter (Signed)
Left message again for patient to call regarding Pap.   Called and spoke with Wells Guiles at [place], let her know we have not been able to get in touch with patient for compliance visit. Per Maryfrances Bunnell, Pap last used 09/24/2019.

## 2019-02-01 NOTE — Telephone Encounter (Signed)
I spoke to patient regarding his BiPap. The patient states that he is up and down so much at night, with having to go the bathroom and coughing that he hasn't been able to wear the BiPap very much. He has been coughing and coughing the mask off. Patient states he has an appointment later this week with his doctor to discuss his waking every hour and will have his wife call us back to schedule a f/u apt. I let him know that [facility] will probably take the machine away if he is not wearing it consistently. He is aware.

## 2019-02-04 DIAGNOSIS — G8929 Other chronic pain: Secondary | ICD-10-CM | POA: Diagnosis not present

## 2019-02-04 DIAGNOSIS — M5416 Radiculopathy, lumbar region: Secondary | ICD-10-CM | POA: Diagnosis not present

## 2019-02-04 DIAGNOSIS — M5117 Intervertebral disc disorders with radiculopathy, lumbosacral region: Secondary | ICD-10-CM | POA: Diagnosis not present

## 2019-02-04 DIAGNOSIS — M5441 Lumbago with sciatica, right side: Secondary | ICD-10-CM | POA: Diagnosis not present

## 2019-02-09 ENCOUNTER — Other Ambulatory Visit: Payer: Self-pay | Admitting: Student

## 2019-02-09 DIAGNOSIS — G8929 Other chronic pain: Secondary | ICD-10-CM

## 2019-02-09 DIAGNOSIS — M5441 Lumbago with sciatica, right side: Principal | ICD-10-CM

## 2019-02-15 ENCOUNTER — Ambulatory Visit: Payer: Medicare Other | Admitting: Psychiatry

## 2019-02-17 ENCOUNTER — Ambulatory Visit: Admission: RE | Admit: 2019-02-17 | Payer: Medicare Other | Source: Ambulatory Visit

## 2019-02-19 ENCOUNTER — Other Ambulatory Visit: Payer: Self-pay | Admitting: Psychiatry

## 2019-02-19 ENCOUNTER — Other Ambulatory Visit: Payer: Self-pay | Admitting: Family Medicine

## 2019-02-19 NOTE — Telephone Encounter (Signed)
Pharmacy requesting refills. Thanks!  

## 2019-02-25 DIAGNOSIS — J069 Acute upper respiratory infection, unspecified: Secondary | ICD-10-CM | POA: Diagnosis not present

## 2019-02-25 DIAGNOSIS — Z20828 Contact with and (suspected) exposure to other viral communicable diseases: Secondary | ICD-10-CM | POA: Diagnosis not present

## 2019-02-25 DIAGNOSIS — Z1383 Encounter for screening for respiratory disorder NEC: Secondary | ICD-10-CM | POA: Diagnosis not present

## 2019-03-01 DIAGNOSIS — J069 Acute upper respiratory infection, unspecified: Secondary | ICD-10-CM | POA: Diagnosis not present

## 2019-03-08 ENCOUNTER — Ambulatory Visit: Payer: Medicare Other | Admitting: Psychiatry

## 2019-03-10 ENCOUNTER — Telehealth: Payer: Self-pay | Admitting: Family Medicine

## 2019-03-10 NOTE — Telephone Encounter (Signed)
Ray Robles, a Education officer, museum with [employer] called wanting to know if Dr. [name] ordered a C Pap machine - Bi Pap for patient because of his sleep study.  [facility] sent him the machine a couple weeks ago but pt had difficulty using and a nurse was suppose to come out there to help him but instead she took the machine and he needs to get one back.  The Social worker said he come to [place] and to [place] but she thinks we are the ones who ordered the machine   Please call patient and find out what is going on so patient can get another c-pap  Thanks  teri

## 2019-03-10 NOTE — Telephone Encounter (Signed)
Dr. [name] did you order a CPAP/ Bipap for this patient?    It looks like the CPAP/ bi-pap is ordered through his pulmonologist [facility], Dr. Ashby Dawes). Per telephone message from 01/26/2019 and 02/01/2019 it was documented by [facility] pulmonology that [facility] wanted  compliance notes for his bi-pap machine. There was question as to whether patient was still using machine. Medicare stopped paying for pap. Patient told [facility] pulmonology that he has not worn it consistently due to nocturia and coughing the mask off. Patient was advised by Burley Saver. that [facility] would probably take the machine away due to not wearing it consistently.   I tried Chief Financial Officer. No answer. Left message to call back.

## 2019-04-06 DIAGNOSIS — H269 Unspecified cataract: Secondary | ICD-10-CM | POA: Diagnosis not present

## 2019-04-06 DIAGNOSIS — H04123 Dry eye syndrome of bilateral lacrimal glands: Secondary | ICD-10-CM | POA: Diagnosis not present

## 2019-05-09 DIAGNOSIS — J069 Acute upper respiratory infection, unspecified: Secondary | ICD-10-CM | POA: Diagnosis not present

## 2019-05-09 DIAGNOSIS — Z20828 Contact with and (suspected) exposure to other viral communicable diseases: Secondary | ICD-10-CM | POA: Diagnosis not present

## 2019-05-12 DIAGNOSIS — Z8616 Personal history of COVID-19: Secondary | ICD-10-CM | POA: Insufficient documentation

## 2019-05-18 DIAGNOSIS — K8500 Idiopathic acute pancreatitis without necrosis or infection: Secondary | ICD-10-CM | POA: Insufficient documentation

## 2019-05-18 DIAGNOSIS — K859 Acute pancreatitis without necrosis or infection, unspecified: Secondary | ICD-10-CM | POA: Diagnosis not present

## 2019-05-18 DIAGNOSIS — R410 Disorientation, unspecified: Secondary | ICD-10-CM | POA: Diagnosis not present

## 2019-05-18 DIAGNOSIS — R1084 Generalized abdominal pain: Secondary | ICD-10-CM | POA: Diagnosis not present

## 2019-05-18 DIAGNOSIS — K8591 Acute pancreatitis with uninfected necrosis, unspecified: Secondary | ICD-10-CM | POA: Diagnosis not present

## 2019-05-18 DIAGNOSIS — U071 COVID-19: Secondary | ICD-10-CM | POA: Diagnosis not present

## 2019-05-18 DIAGNOSIS — K766 Portal hypertension: Secondary | ICD-10-CM | POA: Diagnosis not present

## 2019-05-18 DIAGNOSIS — R109 Unspecified abdominal pain: Secondary | ICD-10-CM | POA: Diagnosis not present

## 2019-05-18 DIAGNOSIS — K7460 Unspecified cirrhosis of liver: Secondary | ICD-10-CM | POA: Diagnosis not present

## 2019-05-19 DIAGNOSIS — G8929 Other chronic pain: Secondary | ICD-10-CM | POA: Diagnosis not present

## 2019-05-19 DIAGNOSIS — U071 COVID-19: Secondary | ICD-10-CM | POA: Diagnosis not present

## 2019-05-19 DIAGNOSIS — R4182 Altered mental status, unspecified: Secondary | ICD-10-CM | POA: Diagnosis not present

## 2019-05-19 DIAGNOSIS — K8500 Idiopathic acute pancreatitis without necrosis or infection: Secondary | ICD-10-CM | POA: Diagnosis not present

## 2019-05-19 DIAGNOSIS — K7460 Unspecified cirrhosis of liver: Secondary | ICD-10-CM | POA: Diagnosis not present

## 2019-05-19 DIAGNOSIS — E669 Obesity, unspecified: Secondary | ICD-10-CM | POA: Diagnosis not present

## 2019-05-19 DIAGNOSIS — I10 Essential (primary) hypertension: Secondary | ICD-10-CM | POA: Diagnosis not present

## 2019-05-19 DIAGNOSIS — R918 Other nonspecific abnormal finding of lung field: Secondary | ICD-10-CM | POA: Diagnosis not present

## 2019-05-20 DIAGNOSIS — E669 Obesity, unspecified: Secondary | ICD-10-CM | POA: Diagnosis not present

## 2019-05-20 DIAGNOSIS — K7460 Unspecified cirrhosis of liver: Secondary | ICD-10-CM | POA: Diagnosis not present

## 2019-05-20 DIAGNOSIS — F5104 Psychophysiologic insomnia: Secondary | ICD-10-CM | POA: Diagnosis not present

## 2019-05-20 DIAGNOSIS — K8500 Idiopathic acute pancreatitis without necrosis or infection: Secondary | ICD-10-CM | POA: Diagnosis not present

## 2019-05-20 DIAGNOSIS — K859 Acute pancreatitis without necrosis or infection, unspecified: Secondary | ICD-10-CM | POA: Diagnosis not present

## 2019-05-20 DIAGNOSIS — I10 Essential (primary) hypertension: Secondary | ICD-10-CM | POA: Diagnosis not present

## 2019-05-20 DIAGNOSIS — U071 COVID-19: Secondary | ICD-10-CM | POA: Diagnosis not present

## 2019-05-27 DIAGNOSIS — K8500 Idiopathic acute pancreatitis without necrosis or infection: Secondary | ICD-10-CM | POA: Diagnosis not present

## 2019-05-27 DIAGNOSIS — U071 COVID-19: Secondary | ICD-10-CM | POA: Diagnosis not present

## 2019-05-27 DIAGNOSIS — K7460 Unspecified cirrhosis of liver: Secondary | ICD-10-CM | POA: Diagnosis not present

## 2019-05-27 DIAGNOSIS — F5101 Primary insomnia: Secondary | ICD-10-CM | POA: Diagnosis not present

## 2019-07-01 ENCOUNTER — Other Ambulatory Visit: Payer: Self-pay | Admitting: Family Medicine

## 2019-07-01 DIAGNOSIS — E785 Hyperlipidemia, unspecified: Secondary | ICD-10-CM

## 2019-07-07 DIAGNOSIS — K7290 Hepatic failure, unspecified without coma: Secondary | ICD-10-CM | POA: Diagnosis not present

## 2019-07-07 DIAGNOSIS — F325 Major depressive disorder, single episode, in full remission: Secondary | ICD-10-CM | POA: Diagnosis not present

## 2019-07-07 DIAGNOSIS — I10 Essential (primary) hypertension: Secondary | ICD-10-CM | POA: Diagnosis not present

## 2019-07-30 ENCOUNTER — Other Ambulatory Visit: Payer: Self-pay | Admitting: Family Medicine

## 2019-07-30 DIAGNOSIS — G4700 Insomnia, unspecified: Secondary | ICD-10-CM

## 2019-08-03 ENCOUNTER — Ambulatory Visit: Payer: Self-pay | Admitting: Family Medicine

## 2019-08-26 ENCOUNTER — Other Ambulatory Visit: Payer: Self-pay | Admitting: Psychiatry

## 2019-09-06 DIAGNOSIS — K7460 Unspecified cirrhosis of liver: Secondary | ICD-10-CM | POA: Diagnosis not present

## 2019-09-06 DIAGNOSIS — B182 Chronic viral hepatitis C: Secondary | ICD-10-CM | POA: Diagnosis not present

## 2019-09-06 DIAGNOSIS — K8591 Acute pancreatitis with uninfected necrosis, unspecified: Secondary | ICD-10-CM | POA: Diagnosis not present

## 2019-09-06 DIAGNOSIS — K7469 Other cirrhosis of liver: Secondary | ICD-10-CM | POA: Diagnosis not present

## 2019-09-06 DIAGNOSIS — K766 Portal hypertension: Secondary | ICD-10-CM | POA: Diagnosis not present

## 2019-09-22 ENCOUNTER — Other Ambulatory Visit: Payer: Self-pay | Admitting: Family Medicine

## 2019-09-22 ENCOUNTER — Other Ambulatory Visit: Payer: Self-pay | Admitting: Psychiatry

## 2019-09-22 DIAGNOSIS — I10 Essential (primary) hypertension: Secondary | ICD-10-CM

## 2019-10-04 DIAGNOSIS — R188 Other ascites: Secondary | ICD-10-CM | POA: Diagnosis not present

## 2019-10-04 DIAGNOSIS — K7460 Unspecified cirrhosis of liver: Secondary | ICD-10-CM | POA: Diagnosis not present

## 2019-10-12 ENCOUNTER — Ambulatory Visit (INDEPENDENT_AMBULATORY_CARE_PROVIDER_SITE_OTHER): Payer: Medicare Other | Admitting: Psychiatry

## 2019-10-12 ENCOUNTER — Other Ambulatory Visit: Payer: Self-pay

## 2019-10-12 ENCOUNTER — Encounter: Payer: Self-pay | Admitting: Psychiatry

## 2019-10-12 DIAGNOSIS — F4310 Post-traumatic stress disorder, unspecified: Secondary | ICD-10-CM

## 2019-10-12 DIAGNOSIS — F1120 Opioid dependence, uncomplicated: Secondary | ICD-10-CM | POA: Diagnosis not present

## 2019-10-12 DIAGNOSIS — F3342 Major depressive disorder, recurrent, in full remission: Secondary | ICD-10-CM | POA: Diagnosis not present

## 2019-10-12 MED ORDER — MIRTAZAPINE 15 MG PO TABS: 15.0000 mg | ORAL_TABLET | Freq: Every day | ORAL | 2 refills | Status: DC

## 2019-10-12 MED ORDER — VENLAFAXINE HCL ER 150 MG PO CP24: 150.0000 mg | ORAL_CAPSULE | Freq: Every day | ORAL | 2 refills | Status: DC

## 2019-10-12 MED ORDER — PRAZOSIN HCL 5 MG PO CAPS: 5.0000 mg | ORAL_CAPSULE | Freq: Every day | ORAL | 2 refills | Status: DC

## 2019-10-12 MED ORDER — QUETIAPINE FUMARATE 300 MG PO TABS: 300.0000 mg | ORAL_TABLET | Freq: Every day | ORAL | 2 refills | Status: DC

## 2019-10-12 NOTE — Progress Notes (Signed)
[facility] MD OP Progress Note  I connected with  Ray Robles on [birth year] by phone application and verified that I am speaking with the correct person using two identifiers.   I discussed the limitations of evaluation and management by phone. The patient expressed understanding and agreed to proceed.   10/12/2019 3:37 PM Ray Robles  MRN:  [id number]  Chief Complaint:  " I was doing okay till I ran out of medications."  HPI:  Pt reported doing well overall. He informed that 2 months ago he ran out of Seroquel and his other doctor sent the prescriptions for him. He reported that he took his last dose of Prazosin last night and he really needs it. He denied any other issues or concerns. He reported that he is still on suboxone and has been off of opioid pain meds. I have reviewed the PDMP during this encounter.  Visit Diagnosis:    ICD-10-CM   1. MDD (major depressive disorder), recurrent, in full remission ([place])  F33.42 mirtazapine (REMERON) 15 MG tablet    QUEtiapine (SEROQUEL) 300 MG tablet    venlafaxine XR (EFFEXOR-XR) 150 MG 24 hr capsule  2. PTSD (post-traumatic stress disorder)  F43.10 prazosin (MINIPRESS) 5 MG capsule    Past Psychiatric History: MDD, anxiety, PTSD  Past Medical History:  Past Medical History:  Diagnosis Date  . Anxiety   . Arthritis   . Cirrhosis ([place])   . Depression   . Dyspnea    doe  . Dysrhythmia   . GERD (gastroesophageal reflux disease)    food sticks in throat has esophagus stretched  . H/O bacterial pneumonia 11/11/2007   2008 and 2015   . Heart murmur   . Hepatitis C   . Hx of endoscopy    05/12/2014- [facility]. Dr Candace Cruise. Benign appearing stricture. Dilated the examination was otherwise normal.02/13/2012- Beniign appearing intrinsic mild stenosis at GE junction, successfully dialated  . Hypertension   . Hypothyroidism   . Liver disease   . Neuromuscular disorder ([place])    chronic pain peripheral neuropathy  . Pre-diabetes   . PTSD  (post-traumatic stress disorder)     Past Surgical History:  Procedure Laterality Date  . COLONOSCOPY WITH PROPOFOL N/A 05/12/2015   Procedure: COLONOSCOPY WITH PROPOFOL;  Surgeon: Hulen Luster, MD;  Location: [facility] ENDOSCOPY;  Service: Gastroenterology;  Laterality: N/A;  . edg    . HERNIA REPAIR  08/2012   Dr. Jamal Collin; [facility]  . INGUINAL HERNIA REPAIR Right 10/28/2017   Procedure: HERNIA REPAIR INGUINAL ADULT;  Surgeon: Leonie Green, MD;  Location: [facility] ORS;  Service: General;  Laterality: Right;    Family Psychiatric History: alcohol abuse- brother  Family History:  Family History  Problem Relation Age of Onset  . Cancer Mother   . Cancer Father   . Alcohol abuse Brother   . Alcohol abuse Brother   . Drug abuse Brother     Social History:  Social History   Socioeconomic History  . Marital status: Married    Spouse name: Ray Robles  . Number of children: 4  . Years of education: Not on file  . Highest education level: Associate degree: occupational, Hotel manager, or vocational program  Occupational History    Comment: retired  Scientific laboratory technician  . Financial resource strain: Not hard at all  . Food insecurity    Worry: Never true    Inability: Never true  . Transportation needs    Medical: No    Non-medical: No  Tobacco Use  . Smoking status: Never Smoker  . Smokeless tobacco: Never Used  Substance and Sexual Activity  . Alcohol use: No    Alcohol/week: 0.0 standard drinks  . Drug use: Not Currently    Types: Oxycodone    Comment: none since 3/18  . Sexual activity: Not Currently  Lifestyle  . Physical activity    Days per week: 0 days    Minutes per session: 0 min  . Stress: Not at all  Relationships  . Social connections    Talks on phone: More than three times a week    Gets together: More than three times a week    Attends religious service: More than 4 times per year    Active member of club or organization: Yes    Attends meetings of clubs or organizations:  More than 4 times per year    Relationship status: Married  Other Topics Concern  . Not on file  Social History Narrative  . Not on file    Allergies:  Allergies  Allergen Reactions  . Ambien [Zolpidem Tartrate] Other (See Comments)    Reaction: Violent sleep  . Ibuprofen Other (See Comments)    Can't take because of liver  . Morphine Diarrhea and Nausea And Vomiting  . Morphine And Related Diarrhea and Nausea And Vomiting  . Tylenol [Acetaminophen] Other (See Comments)    Can't take because of liver  . Xanax [Alprazolam] Other (See Comments)    Brings the ammonia levels up  . Zaleplon     seizures  . Zolpidem Other (See Comments)    Reaction: Violent Sleep    Metabolic Disorder Labs: Lab Results  Component Value Date   HGBA1C 5.4 01/10/2017   No results found for: PROLACTIN Lab Results  Component Value Date   CHOL 192 10/15/2018   TRIG 161 (H) 10/15/2018   HDL 33 (L) 10/15/2018   CHOLHDL 5.8 (H) 10/15/2018   LDLCALC 127 (H) 10/15/2018   LDLCALC 151 (H) 06/10/2018   Lab Results  Component Value Date   TSH 2.710 10/15/2018   TSH 0.38 (A) 07/29/2018   TSH 0.379 07/29/2018    Therapeutic Level Labs: Lab Results  Component Value Date   LITHIUM 1.19 01/12/2017   No results found for: VALPROATE No components found for:  CBMZ  Current Medications: Current Outpatient Medications  Medication Sig Dispense Refill  . buprenorphine-naloxone (SUBOXONE) 2-0.5 mg SUBL SL tablet Place 1-2 tablets See admin instructions under the tongue. Take 2 tablets by mouth in the morning, take 1 tablet by mouth midday and take 2 tablets by mouth at bedtime    . diphenoxylate-atropine (LOMOTIL) 2.5-0.025 MG per tablet Take 2 tablets by mouth every 6 (six) hours as needed for diarrhea or loose stools.     . ezetimibe (ZETIA) 10 MG tablet TAKE 1 TABLET BY MOUTH ONCE DAILY 30 tablet 11  . furosemide (LASIX) 40 MG tablet Take 1 tablet (40 mg total) by mouth 2 (two) times daily. (Patient  taking differently: Take 40 mg daily by mouth. ) 1 tablet 1  . gabapentin (NEURONTIN) 300 MG capsule Take 1 capsule (300 mg total) by mouth 2 (two) times daily. 60 capsule 12  . lactulose (CHRONULAC) 10 GM/15ML solution TAKE 30 MLS BY MOUTH 3 TIMES DAILY (Patient taking differently: Take 20 gm by mouth daily) 240 mL 12  . levothyroxine (SYNTHROID, LEVOTHROID) 50 MCG tablet TAKE 1 TABLET BY MOUTH ONCE DAILY 30 tablet 12  . Melatonin 10 MG  CAPS Take 10 mg at bedtime by mouth.    . metoprolol tartrate (LOPRESSOR) 25 MG tablet TAKE 1 TABLET BY MOUTH TWICE DAILY 60 tablet 12  . metroNIDAZOLE (METROCREAM) 0.75 % cream APPLY TOPICALLY AT BEDTIME 45 g 3  . mirtazapine (REMERON) 15 MG tablet Take 1 tablet (15 mg total) by mouth at bedtime. 30 tablet 2  . NARCAN 4 MG/0.1ML LIQD nasal spray kit     . nitroGLYCERIN (NITROSTAT) 0.4 MG SL tablet Place 0.4 mg under the tongue every 5 (five) minutes as needed for chest pain.    [name] nystatin cream (MYCOSTATIN) Apply 1 application 3 (three) times daily as needed topically (for rash).     [name] omeprazole (PRILOSEC) 20 MG capsule TAKE 2 CAPSULES BY MOUTH ONCE DAILY. 60 capsule 12  . prazosin (MINIPRESS) 5 MG capsule Take 1 capsule (5 mg total) by mouth at bedtime. 30 capsule 2  . predniSONE (DELTASONE) 10 MG tablet Take 1 tablet (10 mg total) by mouth daily. 6,5,4,3,2,1 six day taper 21 tablet 0  . QUEtiapine (SEROQUEL) 300 MG tablet Take 1 tablet (300 mg total) by mouth at bedtime. 30 tablet 2  . ramelteon (ROZEREM) 8 MG tablet TAKE 1 TABLET BY MOUTH AT BEDTIME 30 tablet 11  . spironolactone (ALDACTONE) 50 MG tablet Take 1 tablet (50 mg total) by mouth daily. (Patient taking differently: Take 50 mg by mouth 2 (two) times daily. ) 30 tablet 0  . tamsulosin (FLOMAX) 0.4 MG CAPS capsule TAKE 1 CAPSULE BY MOUTH ONCE EVERY EVENING 30 capsule 12  . triamcinolone cream (KENALOG) 0.1 % APPLY TOPICALLY TWICE DAILY AS NEEDED 30 g 2  . venlafaxine XR (EFFEXOR-XR) 150 MG 24 hr  capsule Take 1 capsule (150 mg total) by mouth daily with breakfast. 30 capsule 2  . XIFAXAN 550 MG TABS tablet      Current Facility-Administered Medications  Medication Dose Route Frequency Provider Last Rate Last Dose  . ipratropium-albuterol (DUONEB) 0.5-2.5 (3) MG/3ML nebulizer solution 3 mL  3 mL Nebulization Once Chrismon, Vickki Muff, PA         Psychiatric Specialty Exam: ROS  There were no vitals taken for this visit.There is no height or weight on file to calculate BMI.  General Appearance: unable to assess due to phone visit  Eye Contact:   unable to assess due to phone visit  Speech:  Clear and Coherent and Normal Rate  Volume:  Normal  Mood:  Euthymic  Affect:  Congruent  Thought Process:  Goal Directed, Linear and Descriptions of Associations: Intact  Orientation:  Full (Time, Place, and Person)  Thought Content: Logical   Suicidal Thoughts:  No  Homicidal Thoughts:  No  Memory:  Recent;   Good Remote;   Good  Judgement:  Fair  Insight:  Fair  Psychomotor Activity:  Normal  Concentration:  Concentration: Fair and Attention Span: Fair  Recall:  Good  Fund of Knowledge: Good  Language: Good  Akathisia:  Negative  Handed:  Right  AIMS (if indicated): not done  Assets:  Communication Skills Desire for Improvement Financial Resources/Insurance Social Support Transportation  ADL's:  Intact  Cognition: WNL  Sleep:  Good   Screenings: PHQ2-9     Office Visit from 09/11/2015 in [facility]  PHQ-2 Total Score  3  PHQ-9 Total Score  16       Assessment and Plan: 58 y/o male with hx of depression, PTSD and anxiety now doing well on his medications. He is  on suboxone maintenance therapy.  1. MDD (major depressive disorder), recurrent, in full remission ([place])  - mirtazapine (REMERON) 15 MG tablet; Take 1 tablet (15 mg total) by mouth at bedtime.  Dispense: 30 tablet; Refill: 2 - QUEtiapine (SEROQUEL) 300 MG tablet; Take 1 tablet (300 mg total) by  mouth at bedtime.  Dispense: 30 tablet; Refill: 2 - venlafaxine XR (EFFEXOR-XR) 150 MG 24 hr capsule; Take 1 capsule (150 mg total) by mouth daily with breakfast.  Dispense: 30 capsule; Refill: 2  2. PTSD (post-traumatic stress disorder)  - prazosin (MINIPRESS) 5 MG capsule; Take 1 capsule (5 mg total) by mouth at bedtime.  Dispense: 30 capsule; Refill: 2  3. Opioid dependence on maintenance agonist therapy, no symptoms (HCC) - Continue Suboxone TID prescribed by specialty clinic.  Continue same regimen, F/up in 3 months.    [place] Crane, MD 10/12/2019, 3:37 PM

## 2019-11-16 ENCOUNTER — Other Ambulatory Visit: Payer: Self-pay | Admitting: Family Medicine

## 2019-11-16 DIAGNOSIS — E039 Hypothyroidism, unspecified: Secondary | ICD-10-CM

## 2019-12-09 ENCOUNTER — Ambulatory Visit: Payer: Medicare Other | Admitting: Urology

## 2019-12-12 ENCOUNTER — Other Ambulatory Visit: Payer: Self-pay | Admitting: Family Medicine

## 2019-12-12 DIAGNOSIS — E039 Hypothyroidism, unspecified: Secondary | ICD-10-CM

## 2019-12-13 NOTE — Telephone Encounter (Signed)
LMTCB to schedule a follow up appointment. Patient has not been seen since 11/2018

## 2019-12-13 NOTE — Telephone Encounter (Signed)
Requested medication (s) are due for refill today: yes  Requested medication (s) are on the active medication list: yes  Last refill:  11/16/2019  Future visit scheduled: no  Notes to clinic:  no valid encounter within last 12 months   Requested Prescriptions  Pending Prescriptions Disp Refills   omeprazole (PRILOSEC) 20 MG capsule [Pharmacy Med Name: OMEPRAZOLE DR 20 MG CAP] 60 capsule 12    Sig: TAKE 2 CAPSULES BY MOUTH ONCE DAILY.      Gastroenterology: Proton Pump Inhibitors Failed - 12/12/2019  2:39 PM      Failed - Valid encounter within last 12 months    Recent Outpatient Visits           1 year ago Bronchitis with bronchospasm   [place], Vickki Muff, [place]   1 year ago Cough   [facility] Birdie Sons, MD   1 year ago Hypothyroidism, unspecified type   [facility] Birdie Sons, MD   1 year ago Rosacea (suspected)   [facility] Birdie Sons, MD   1 year ago Essential hypertension   [place], Kirstie Peri, MD       Future Appointments             In 1 week Diamantina [place], Herbert Seta, MD [facility]              levothyroxine (SYNTHROID) 50 MCG tablet [Pharmacy Med Name: LEVOTHYROXINE SODIUM 50 MCG TAB] 30 tablet 0    Sig: TAKE 1 TABLET BY MOUTH ONCE DAILY      Endocrinology:  Hypothyroid Agents Failed - 12/12/2019  2:39 PM      Failed - TSH needs to be rechecked within 3 months after an abnormal result. Refill until TSH is due.      Failed - TSH in normal range and within 360 days    TSH  Date Value Ref Range Status  10/15/2018 2.710 0.450 - 4.500 uIU/mL Final          Failed - Valid encounter within last 12 months    Recent Outpatient Visits           1 year ago Bronchitis with bronchospasm   [place], Vickki Muff, [place]   1 year ago Cough   [facility] Birdie Sons, MD   1 year ago  Hypothyroidism, unspecified type   [facility] Birdie Sons, MD   1 year ago Rosacea (suspected)   [facility] Birdie Sons, MD   1 year ago Essential hypertension   [place], Kirstie Peri, MD       Future Appointments             In 1 week Diamantina [place], Herbert Seta, [place]

## 2019-12-23 ENCOUNTER — Ambulatory Visit: Payer: Medicare Other | Admitting: Urology

## 2019-12-30 ENCOUNTER — Telehealth: Payer: Self-pay

## 2019-12-30 ENCOUNTER — Encounter: Payer: Self-pay | Admitting: Urology

## 2019-12-30 ENCOUNTER — Ambulatory Visit: Payer: Medicare Other | Admitting: Urology

## 2019-12-30 NOTE — Telephone Encounter (Signed)
  [place]-I thought you may want to review this before I marked him as screened  Copied from [place] [id number]. Topic: Clinical - COVID Pre-Screen >> Dec 30, 2019 10:58 AM Mathis Bud wrote: 1. To the best of your knowledge, have you been in close contact with anyone with a confirmed diagnosis of COVID 19? no  2. Have you had any one or more of the following: fever, chills, cough, shortness of breath or any flu-like symptoms?  no  3. Have you been diagnosed with or have a previous diagnosis of COVID 19?  Yes in march 2020  4. I am going to go over a few other symptoms with you. Please let me know if you are experiencing any of the following: Headaches, patient states he always have headaches

## 2019-12-31 ENCOUNTER — Other Ambulatory Visit: Payer: Self-pay

## 2019-12-31 ENCOUNTER — Encounter: Payer: Self-pay | Admitting: Family Medicine

## 2019-12-31 ENCOUNTER — Ambulatory Visit (INDEPENDENT_AMBULATORY_CARE_PROVIDER_SITE_OTHER): Payer: Medicare Other | Admitting: Family Medicine

## 2019-12-31 VITALS — BP 123/72 | HR 71 | Temp 96.2°F | Resp 18 | Ht 71.0 in | Wt 229.0 lb

## 2019-12-31 DIAGNOSIS — F1120 Opioid dependence, uncomplicated: Secondary | ICD-10-CM | POA: Diagnosis not present

## 2019-12-31 DIAGNOSIS — E039 Hypothyroidism, unspecified: Secondary | ICD-10-CM

## 2019-12-31 DIAGNOSIS — E785 Hyperlipidemia, unspecified: Secondary | ICD-10-CM | POA: Diagnosis not present

## 2019-12-31 DIAGNOSIS — Z125 Encounter for screening for malignant neoplasm of prostate: Secondary | ICD-10-CM

## 2019-12-31 DIAGNOSIS — F3342 Major depressive disorder, recurrent, in full remission: Secondary | ICD-10-CM

## 2019-12-31 NOTE — Progress Notes (Signed)
Patient: Ray Robles Male    DOB: [birth year]   59 y.o.   MRN: [id number] Visit Date: 12/31/2019  Today's Provider: Lelon Huh, MD   Chief Complaint  Patient presents with  . Follow-up  . Hypertension  . Hyperlipidemia  . Hypothyroidism   Subjective:     HPI    Hypertension, follow-up:  BP Readings from Last 3 Encounters:  12/31/19 123/72  12/07/18 109/66  11/17/18 130/70    He was last seen for hypertension 06/10/2018.  BP at that visit was 140/68. Management since that visit includes; no changes.He reports good compliance with treatment. He is not having side effects. none He is exercising. He is not adherent to low salt diet.   Outside blood pressures are 145/88. He is experiencing none.  Patient denies chest pain, chest pressure/discomfort, claudication, dyspnea, exertional chest pressure/discomfort, fatigue, irregular heart beat, lower extremity edema, near-syncope, orthopnea, palpitations, paroxysmal nocturnal dyspnea, syncope and tachypnea.   Cardiovascular risk factors include advanced age (older than 49 for men, 46 for women), dyslipidemia, hypertension and male gender.  Use of agents associated with hypertension: none.   ----------------------------------------------------------    Lipid/Cholesterol, Follow-up:   Last seen for this 10/07/2018.  Management since that visit includes; labs checked, no changes were made.  Last Lipid Panel:    Component Value Date/Time   CHOL 192 10/15/2018 1124   TRIG 161 (H) 10/15/2018 1124   HDL 33 (L) 10/15/2018 1124   CHOLHDL 5.8 (H) 10/15/2018 1124   LDLCALC 127 (H) 10/15/2018 1124    He reports good compliance with treatment. He is not having side effects. none  Wt Readings from Last 3 Encounters:  12/31/19 229 lb (103.9 kg)  12/07/18 251 lb 12.8 oz (114.2 kg)  11/17/18 260 lb (117.9 kg)    -----------------------------------------------------------  Hypothyroidism, unspecified type From  10/07/2018-Stable on lowered dose of levothyroxine. labs checked, no changes.   Follow up chronic back pain.  Had been doing well on gabapentin which he states he ran out of several months ago and doing well without medication.   Allergies  Allergen Reactions  . Ambien [Zolpidem Tartrate] Other (See Comments)    Reaction: Violent sleep  . Ibuprofen Other (See Comments)    Can't take because of liver  . Morphine Diarrhea and Nausea And Vomiting  . Morphine And Related Diarrhea and Nausea And Vomiting  . Tylenol [Acetaminophen] Other (See Comments)    Can't take because of liver  . Xanax [Alprazolam] Other (See Comments)    Brings the ammonia levels up  . Zaleplon     seizures  . Zolpidem Other (See Comments)    Reaction: Violent Sleep     Current Outpatient Medications:  .  buprenorphine-naloxone (SUBOXONE) 2-0.5 mg SUBL SL tablet, Place 1-2 tablets See admin instructions under the tongue. Take 2 tablets by mouth in the morning, take 1 tablet by mouth midday and take 2 tablets by mouth at bedtime, Disp: , Rfl:  .  diphenoxylate-atropine (LOMOTIL) 2.5-0.025 MG per tablet, Take 2 tablets by mouth every 6 (six) hours as needed for diarrhea or loose stools. , Disp: , Rfl:  .  ezetimibe (ZETIA) 10 MG tablet, TAKE 1 TABLET BY MOUTH ONCE DAILY, Disp: 30 tablet, Rfl: 11 .  furosemide (LASIX) 40 MG tablet, Take 1 tablet (40 mg total) by mouth 2 (two) times daily. (Patient taking differently: Take 40 mg daily by mouth. ), Disp: 1 tablet, Rfl: 1 .  gabapentin (NEURONTIN) 300  MG capsule, Take 1 capsule (300 mg total) by mouth 2 (two) times daily., Disp: 60 capsule, Rfl: 12 .  lactulose (CHRONULAC) 10 GM/15ML solution, TAKE 30 MLS BY MOUTH 3 TIMES DAILY (Patient taking differently: Take 20 gm by mouth daily), Disp: 240 mL, Rfl: 12 .  levothyroxine (SYNTHROID) 50 MCG tablet, TAKE 1 TABLET BY MOUTH ONCE DAILY, Disp: 30 tablet, Rfl: 5 .  Melatonin 10 MG CAPS, Take 10 mg at bedtime by mouth., Disp: ,  Rfl:  .  metoprolol tartrate (LOPRESSOR) 25 MG tablet, TAKE 1 TABLET BY MOUTH TWICE DAILY, Disp: 60 tablet, Rfl: 12 .  metroNIDAZOLE (METROCREAM) 0.75 % cream, APPLY TOPICALLY AT BEDTIME, Disp: 45 g, Rfl: 3 .  mirtazapine (REMERON) 15 MG tablet, Take 1 tablet (15 mg total) by mouth at bedtime., Disp: 30 tablet, Rfl: 2 .  NARCAN 4 MG/0.1ML LIQD nasal spray kit, , Disp: , Rfl:  .  nitroGLYCERIN (NITROSTAT) 0.4 MG SL tablet, Place 0.4 mg under the tongue every 5 (five) minutes as needed for chest pain., Disp: , Rfl:  .  nystatin cream (MYCOSTATIN), Apply 1 application 3 (three) times daily as needed topically (for rash). , Disp: , Rfl:  .  omeprazole (PRILOSEC) 20 MG capsule, TAKE 2 CAPSULES BY MOUTH ONCE DAILY, Disp: 60 capsule, Rfl: 12 .  OXYGEN, Inhale into the lungs., Disp: , Rfl:  .  prazosin (MINIPRESS) 5 MG capsule, Take 1 capsule (5 mg total) by mouth at bedtime., Disp: 30 capsule, Rfl: 2 .  QUEtiapine (SEROQUEL) 300 MG tablet, Take 1 tablet (300 mg total) by mouth at bedtime., Disp: 30 tablet, Rfl: 2 .  ramelteon (ROZEREM) 8 MG tablet, TAKE 1 TABLET BY MOUTH AT BEDTIME, Disp: 30 tablet, Rfl: 11 .  spironolactone (ALDACTONE) 50 MG tablet, Take 1 tablet (50 mg total) by mouth daily. (Patient taking differently: Take 50 mg by mouth 2 (two) times daily. ), Disp: 30 tablet, Rfl: 0 .  tamsulosin (FLOMAX) 0.4 MG CAPS capsule, TAKE 1 CAPSULE BY MOUTH ONCE EVERY EVENING, Disp: 30 capsule, Rfl: 12 .  triamcinolone cream (KENALOG) 0.1 %, APPLY TOPICALLY TWICE DAILY AS NEEDED, Disp: 30 g, Rfl: 2 .  venlafaxine XR (EFFEXOR-XR) 150 MG 24 hr capsule, Take 1 capsule (150 mg total) by mouth daily with breakfast., Disp: 30 capsule, Rfl: 2 .  XIFAXAN 550 MG TABS tablet, , Disp: , Rfl:  .  predniSONE (DELTASONE) 10 MG tablet, Take 1 tablet (10 mg total) by mouth daily. 6,5,4,3,2,1 six day taper (Patient not taking: Reported on 12/31/2019), Disp: 21 tablet, Rfl: 0  Current Facility-Administered Medications:  .   ipratropium-albuterol (DUONEB) 0.5-2.5 (3) MG/3ML nebulizer solution 3 mL, 3 mL, Nebulization, Once, Chrismon, Dennis E, PA  Review of Systems  Constitutional: Negative for appetite change, chills and fever.  Respiratory: Negative for chest tightness, shortness of breath and wheezing.   Cardiovascular: Negative for chest pain and palpitations.  Gastrointestinal: Negative for abdominal pain, nausea and vomiting.    Social History   Tobacco Use  . Smoking status: Never Smoker  . Smokeless tobacco: Never Used  Substance Use Topics  . Alcohol use: No    Alcohol/week: 0.0 standard drinks      Objective:   BP 123/72 (BP Location: Right Arm, Patient Position: Sitting, Cuff Size: Large)   Pulse 71   Temp (!) 96.2 F (35.7 C) (Other (Comment))   Resp 18   Ht '5\' 11"'$  (1.803 m)   Wt 229 lb (103.9 kg)   SpO2  97%   BMI 31.94 kg/m  Vitals:   12/31/19 1118  BP: 123/72  Pulse: 71  Resp: 18  Temp: (!) 96.2 F (35.7 C)  TempSrc: Other (Comment)  SpO2: 97%  Weight: 229 lb (103.9 kg)  Height: '5\' 11"'$  (1.803 m)  Body mass index is 31.94 kg/m.   Physical Exam   General: Appearance:    Obese male in no acute distress  Eyes:    PERRL, conjunctiva/corneas clear, EOM's intact       Lungs:     Clear to auscultation bilaterally, respirations unlabored  Heart:    Normal heart rate. Normal rhythm. No murmurs, rubs, or gallops.   MS:   All extremities are intact.   Neurologic:   Awake, alert, oriented x 3. No apparent focal neurological           defect.        No results found for any visits on 12/31/19.     Assessment & Plan    1. Hypothyroidism, unspecified type Due to check - TSH  2. Hyperlipidemia, unspecified hyperlipidemia type Doing well on ezetimibe  - Lipid panel - Comprehensive metabolic panel  3. Prostate cancer screening  - PSA  4. Opioid dependence on maintenance agonist therapy, no symptoms (HCC) Off OxyContin x 3 years and doing very well on xuboxone  5.  MDD (major depressive disorder), recurrent, in full remission ([place]) Doing well on current regiment of venlafaxine and sleep medications. Continue current medications.    Other orders - OXYGEN; Inhale into the lungs.     Lelon Huh, MD  [facility]

## 2020-01-01 LAB — COMPREHENSIVE METABOLIC PANEL
ALT: 12 IU/L (ref 0–44)
AST: 26 IU/L (ref 0–40)
Albumin/Globulin Ratio: 1.6 (ref 1.2–2.2)
Albumin: 4.6 g/dL (ref 3.8–4.9)
Alkaline Phosphatase: 161 IU/L — ABNORMAL HIGH (ref 39–117)
BUN/Creatinine Ratio: 9 — ABNORMAL LOW (ref 10–24)
BUN: 10 mg/dL (ref 8–27)
Bilirubin Total: 0.4 mg/dL (ref 0.0–1.2)
CO2: 24 mmol/L (ref 20–29)
Calcium: 9 mg/dL (ref 8.6–10.2)
Chloride: 97 mmol/L (ref 96–106)
Creatinine, Ser: 1.1 mg/dL (ref 0.76–1.27)
GFR calc Af Amer: 84 mL/min/{1.73_m2} (ref >59)
GFR calc non Af Amer: 73 mL/min/{1.73_m2} (ref >59)
Globulin, Total: 2.9 g/dL (ref 1.5–4.5)
Glucose: 147 mg/dL — ABNORMAL HIGH (ref 65–99)
Potassium: 3.7 mmol/L (ref 3.5–5.2)
Sodium: 136 mmol/L (ref 134–144)
Total Protein: 7.5 g/dL (ref 6.0–8.5)

## 2020-01-01 LAB — LIPID PANEL
Chol/HDL Ratio: 5.1 ratio — ABNORMAL HIGH (ref 0.0–5.0)
Cholesterol, Total: 183 mg/dL (ref 100–199)
HDL: 36 mg/dL — ABNORMAL LOW (ref >39)
LDL Chol Calc (NIH): 122 mg/dL — ABNORMAL HIGH (ref 0–99)
Triglycerides: 141 mg/dL (ref 0–149)
VLDL Cholesterol Cal: 25 mg/dL (ref 5–40)

## 2020-01-01 LAB — PSA: Prostate Specific Ag, Serum: 0.1 ng/mL (ref 0.0–4.0)

## 2020-01-01 LAB — TSH: TSH: 5.42 u[IU]/mL — ABNORMAL HIGH (ref 0.450–4.500)

## 2020-01-03 ENCOUNTER — Telehealth: Payer: Self-pay

## 2020-01-03 NOTE — Telephone Encounter (Signed)
See result note, called and spoke with patients wife and appt was made for 04/03/20. KW

## 2020-01-03 NOTE — Telephone Encounter (Signed)
Pt given lab results per notes of Dr [name] on 01/02/20. Pt verbalized understanding. Sending back to office. Please enter order for lab work and call to schedule.

## 2020-01-06 ENCOUNTER — Other Ambulatory Visit: Payer: Self-pay | Admitting: Family Medicine

## 2020-01-10 ENCOUNTER — Ambulatory Visit: Payer: Medicare Other | Admitting: Psychiatry

## 2020-01-10 ENCOUNTER — Other Ambulatory Visit: Payer: Self-pay

## 2020-01-27 ENCOUNTER — Encounter: Payer: Self-pay | Admitting: Psychiatry

## 2020-01-27 ENCOUNTER — Other Ambulatory Visit: Payer: Self-pay

## 2020-01-27 ENCOUNTER — Ambulatory Visit (INDEPENDENT_AMBULATORY_CARE_PROVIDER_SITE_OTHER): Payer: Medicare Other | Admitting: Psychiatry

## 2020-01-27 DIAGNOSIS — F1120 Opioid dependence, uncomplicated: Secondary | ICD-10-CM | POA: Diagnosis not present

## 2020-01-27 DIAGNOSIS — F3342 Major depressive disorder, recurrent, in full remission: Secondary | ICD-10-CM

## 2020-01-27 DIAGNOSIS — F4310 Post-traumatic stress disorder, unspecified: Secondary | ICD-10-CM

## 2020-01-27 MED ORDER — QUETIAPINE FUMARATE 300 MG PO TABS: 300.0000 mg | ORAL_TABLET | Freq: Every day | ORAL | 0 refills | Status: DC

## 2020-01-27 MED ORDER — MIRTAZAPINE 15 MG PO TABS: 15.0000 mg | ORAL_TABLET | Freq: Every day | ORAL | 0 refills | Status: DC

## 2020-01-27 MED ORDER — VENLAFAXINE HCL ER 150 MG PO CP24: 150.0000 mg | ORAL_CAPSULE | Freq: Every day | ORAL | 0 refills | Status: DC

## 2020-01-27 MED ORDER — PRAZOSIN HCL 5 MG PO CAPS: 5.0000 mg | ORAL_CAPSULE | Freq: Every day | ORAL | 0 refills | Status: DC

## 2020-01-27 NOTE — Progress Notes (Signed)
[place] MD OP Progress Note  I connected with  [name] on 01/27/20 by a video enabled telemedicine application and verified that I am speaking with the correct person using two identifiers.   I discussed the limitations of evaluation and management by telemedicine. The patient expressed understanding and agreed to proceed.    01/27/2020 11:26 AM AREND [facility]  MRN:  [id number]  Chief Complaint:  " I was doing okay till I ran out of medications 2 weeks ago."  HPI:  Pt reported that he was given out of his prescriptions about 2 weeks ago and he has not been doing well since then.  He reported not being able to sleep much during the night due to being out of Seroquel.  Other than that he has done well.  He enjoys time with his grandchildren. He stated there is nothing specific going on in his life other than the routine stuff.  He stated he is still on the fence about getting the COVID-19 vaccine as he has heard mixed feedback about it.  He would like to take more time to think about it. He reported that he is still on suboxone and has been off of opioid pain meds. I have reviewed the PDMP during this encounter.  Visit Diagnosis:    ICD-10-CM   1. MDD (major depressive disorder), recurrent, in full remission ([place])  F33.42   2. PTSD (post-traumatic stress disorder)  F43.10   3. Opioid dependence on maintenance agonist therapy, no symptoms (HCC)  F11.20     Past Psychiatric History: MDD, anxiety, PTSD  Past Medical History:  Past Medical History:  Diagnosis Date  . Anxiety   . Arthritis   . Cirrhosis ([place])   . Depression   . Dyspnea    doe  . Dysrhythmia   . GERD (gastroesophageal reflux disease)    food sticks in throat has esophagus stretched  . H/O bacterial pneumonia 11/11/2007   2008 and 2015   . Heart murmur   . Hepatitis C   . Hx of endoscopy    05/12/2014- [facility]. Dr Candace Cruise. Benign appearing stricture. Dilated the examination was otherwise normal.02/13/2012- Beniign  appearing intrinsic mild stenosis at GE junction, successfully dialated  . Hypertension   . Hypothyroidism   . Liver disease   . Neuromuscular disorder ([employer])    chronic pain peripheral neuropathy  . Pre-diabetes   . PTSD (post-traumatic stress disorder)     Past Surgical History:  Procedure Laterality Date  . COLONOSCOPY WITH PROPOFOL N/A 05/12/2015   Procedure: COLONOSCOPY WITH PROPOFOL;  Surgeon: Hulen Luster, MD;  Location: [facility] ENDOSCOPY;  Service: Gastroenterology;  Laterality: N/A;  . edg    . HERNIA REPAIR  08/2012   Dr. Jamal Collin; [facility]  . INGUINAL HERNIA REPAIR Right 10/28/2017   Procedure: HERNIA REPAIR INGUINAL ADULT;  Surgeon: Leonie Green, MD;  Location: [facility] ORS;  Service: General;  Laterality: Right;    Family Psychiatric History: alcohol abuse- brother  Family History:  Family History  Problem Relation Age of Onset  . Cancer Mother   . Cancer Father   . Alcohol abuse Brother   . Alcohol abuse Brother   . Drug abuse Brother     Social History:  Social History   Socioeconomic History  . Marital status: Married    Spouse name: Maralyn Sago  . Number of children: 4  . Years of education: Not on file  . Highest education level: Associate degree: occupational, Hotel manager, or vocational program  Occupational History    Comment: retired  Tobacco Use  . Smoking status: Never Smoker  . Smokeless tobacco: Never Used  Substance and Sexual Activity  . Alcohol use: No    Alcohol/week: 0.0 standard drinks  . Drug use: Not Currently    Types: Oxycodone    Comment: none since 3/18  . Sexual activity: Not Currently  Other Topics Concern  . Not on file  Social History Narrative  . Not on file   Social Determinants of Health   Financial Resource Strain:   . Difficulty of Paying Living Expenses: Not on file  Food Insecurity:   . Worried About Charity fundraiser in the Last Year: Not on file  . Ran Out of Food in the Last Year: Not on file  Transportation Needs:    . Lack of Transportation (Medical): Not on file  . Lack of Transportation (Non-Medical): Not on file  Physical Activity:   . Days of Exercise per Week: Not on file  . Minutes of Exercise per Session: Not on file  Stress:   . Feeling of Stress : Not on file  Social Connections:   . Frequency of Communication with Friends and Family: Not on file  . Frequency of Social Gatherings with Friends and Family: Not on file  . Attends Religious Services: Not on file  . Active Member of Clubs or Organizations: Not on file  . Attends Archivist Meetings: Not on file  . Marital Status: Not on file    Allergies:  Allergies  Allergen Reactions  . Ambien [Zolpidem Tartrate] Other (See Comments)    Reaction: Violent sleep  . Ibuprofen Other (See Comments)    Can't take because of liver  . Morphine Diarrhea and Nausea And Vomiting  . Morphine And Related Diarrhea and Nausea And Vomiting  . Tylenol [Acetaminophen] Other (See Comments)    Can't take because of liver  . Xanax [Alprazolam] Other (See Comments)    Brings the ammonia levels up  . Zaleplon     seizures  . Zolpidem Other (See Comments)    Reaction: Violent Sleep    Metabolic Disorder Labs: Lab Results  Component Value Date   HGBA1C 5.4 01/10/2017   No results found for: PROLACTIN Lab Results  Component Value Date   CHOL 183 12/31/2019   TRIG 141 12/31/2019   HDL 36 (L) 12/31/2019   CHOLHDL 5.1 (H) 12/31/2019   LDLCALC 122 (H) 12/31/2019   LDLCALC 127 (H) 10/15/2018   Lab Results  Component Value Date   TSH 5.420 (H) 12/31/2019   TSH 2.710 10/15/2018    Therapeutic Level Labs: Lab Results  Component Value Date   LITHIUM 1.19 01/12/2017   No results found for: VALPROATE No components found for:  CBMZ  Current Medications: Current Outpatient Medications  Medication Sig Dispense Refill  . buprenorphine-naloxone (SUBOXONE) 2-0.5 mg SUBL SL tablet Place 1-2 tablets See admin instructions under the  tongue. Take 2 tablets by mouth in the morning, take 1 tablet by mouth midday and take 2 tablets by mouth at bedtime    . diphenoxylate-atropine (LOMOTIL) 2.5-0.025 MG per tablet Take 2 tablets by mouth every 6 (six) hours as needed for diarrhea or loose stools.     . ezetimibe (ZETIA) 10 MG tablet TAKE 1 TABLET BY MOUTH ONCE DAILY 30 tablet 11  . furosemide (LASIX) 40 MG tablet Take 1 tablet (40 mg total) by mouth 2 (two) times daily. (Patient taking differently: Take 40 mg  daily by mouth. ) 1 tablet 1  . lactulose (CHRONULAC) 10 GM/15ML solution TAKE 30 MLS BY MOUTH 3 TIMES DAILY (Patient taking differently: Take 20 gm by mouth daily) 240 mL 12  . levothyroxine (SYNTHROID) 50 MCG tablet TAKE 1 TABLET BY MOUTH ONCE DAILY 30 tablet 5  . Melatonin 10 MG CAPS Take 10 mg at bedtime by mouth.    . metoprolol tartrate (LOPRESSOR) 25 MG tablet TAKE 1 TABLET BY MOUTH TWICE DAILY 60 tablet 12  . metroNIDAZOLE (METROCREAM) 0.75 % cream APPLY TOPICALLY AT BEDTIME 45 g 3  . mirtazapine (REMERON) 15 MG tablet Take 1 tablet (15 mg total) by mouth at bedtime. 30 tablet 2  . NARCAN 4 MG/0.1ML LIQD nasal spray kit     . nitroGLYCERIN (NITROSTAT) 0.4 MG SL tablet Place 0.4 mg under the tongue every 5 (five) minutes as needed for chest pain.    [name] nystatin cream (MYCOSTATIN) Apply 1 application 3 (three) times daily as needed topically (for rash).     [name] omeprazole (PRILOSEC) 20 MG capsule TAKE 2 CAPSULES BY MOUTH ONCE DAILY 60 capsule 12  . OXYGEN Inhale into the lungs.    . prazosin (MINIPRESS) 5 MG capsule Take 1 capsule (5 mg total) by mouth at bedtime. 30 capsule 2  . QUEtiapine (SEROQUEL) 300 MG tablet Take 1 tablet (300 mg total) by mouth at bedtime. 30 tablet 2  . ramelteon (ROZEREM) 8 MG tablet TAKE 1 TABLET BY MOUTH AT BEDTIME 30 tablet 11  . spironolactone (ALDACTONE) 50 MG tablet Take 1 tablet (50 mg total) by mouth daily. (Patient taking differently: Take 50 mg by mouth 2 (two) times daily. ) 30 tablet 0   . tamsulosin (FLOMAX) 0.4 MG CAPS capsule TAKE 1 CAPSULE BY MOUTH ONCE EVERY EVENING 30 capsule 12  . triamcinolone cream (KENALOG) 0.1 % APPLY TOPICALLY TWICE DAILY AS NEEDED 30 g 2  . venlafaxine XR (EFFEXOR-XR) 150 MG 24 hr capsule Take 1 capsule (150 mg total) by mouth daily with breakfast. 30 capsule 2  . XIFAXAN 550 MG TABS tablet      Current Facility-Administered Medications  Medication Dose Route Frequency Provider Last Rate Last Admin  . ipratropium-albuterol (DUONEB) 0.5-2.5 (3) MG/3ML nebulizer solution 3 mL  3 mL Nebulization Once Chrismon, Vickki Muff, PA         Psychiatric Specialty Exam: ROS  There were no vitals taken for this visit.There is no height or weight on file to calculate BMI.  General Appearance: Fairly groomed  Eye Contact:   good  Speech:  Clear and Coherent and Normal Rate  Volume:  Normal  Mood:  Euthymic  Affect:  Congruent  Thought Process:  Goal Directed, Linear and Descriptions of Associations: Intact  Orientation:  Full (Time, Place, and Person)  Thought Content: Logical   Suicidal Thoughts:  No  Homicidal Thoughts:  No  Memory:  Recent;   Good Remote;   Good  Judgement:  Fair  Insight:  Fair  Psychomotor Activity:  Normal  Concentration:  Concentration: Fair and Attention Span: Fair  Recall:  Good  Fund of Knowledge: Good  Language: Good  Akathisia:  Negative  Handed:  Right  AIMS (if indicated): not done  Assets:  Communication Skills Desire for Improvement Financial Resources/Insurance Social Support Transportation  ADL's:  Intact  Cognition: WNL  Sleep:  Poor after he ran out of Seroquel.   Screenings: PHQ2-9     [facility] from [facility]/11/[facility] in [facility]  Total Score  3  PHQ-9 Total Score  16       Assessment and Plan: 59 y/o male with hx of depression, PTSD and anxiety doing well on his current medications. He is on suboxone maintenance therapy.  He ran out of his medicines about 2 weeks ago.  He  was agreeable for the writer to send 90-day prescription so that he does not have to worry about frequent refills at the pharmacy.  1. MDD (major depressive disorder), recurrent, in full remission ([place])  - mirtazapine (REMERON) 15 MG tablet; Take 1 tablet (15 mg total) by mouth at bedtime.  Dispense: 30 tablet; Refill: 2 - QUEtiapine (SEROQUEL) 300 MG tablet; Take 1 tablet (300 mg total) by mouth at bedtime.  Dispense: 30 tablet; Refill: 2 - venlafaxine XR (EFFEXOR-XR) 150 MG 24 hr capsule; Take 1 capsule (150 mg total) by mouth daily with breakfast.  Dispense: 30 capsule; Refill: 2  2. PTSD (post-traumatic stress disorder)  - prazosin (MINIPRESS) 5 MG capsule; Take 1 capsule (5 mg total) by mouth at bedtime.  Dispense: 30 capsule; Refill: 2  3. Opioid dependence on maintenance agonist therapy, no symptoms (HCC) - Continue Suboxone TID prescribed by specialty clinic.  Continue same regimen, F/up in 3 months.    [place] Crane, MD 01/27/2020, 11:26 AM

## 2020-01-31 ENCOUNTER — Other Ambulatory Visit: Payer: Self-pay | Admitting: Family Medicine

## 2020-03-06 ENCOUNTER — Telehealth: Payer: Self-pay | Admitting: Family Medicine

## 2020-03-06 NOTE — Telephone Encounter (Signed)
Left message for patient to schedule Annual Wellness Visit.  Please schedule with Nurse Health Advisor Victoria Britt, RN at [facility]  

## 2020-03-31 NOTE — Progress Notes (Deleted)
Established patient visit   Patient: Ray Robles   DOB: [birth year]   59 y.o. Male  MRN: [id number] Visit Date: 04/03/2020  Today's healthcare provider: Lelon Huh, MD   No chief complaint on file.  Esperanza Heir Debbera Wolken,acting as a Education administrator for Lelon Huh, MD.,have documented all relevant documentation on the behalf of Lelon Huh, MD,as directed by  Lelon Huh, MD while in the presence of Lelon Huh, MD.  Subjective    HPI   Hypertension, follow-up  BP Readings from Last 3 Encounters:  12/31/19 123/72  12/07/18 109/66  11/17/18 130/70   He was last seen for hypertension 3 months ago.  BP at that visit was 123/72. Management since that visit includes no change. He reports good compliance with treatment. He is not having side effects.  He {is/is not:9024} exercising. He {is/is not:9024} adherent to low salt diet.   Outside blood pressures are {enter patient reported home BP, or 'not being checked':1}.  He does not smoke.  Use of agents associated with hypertension: none.   ----------------------------------------------------------------------------------------- Lipid/Cholesterol, follow-up  Last Lipid Panel: Lab Results  Component Value Date   CHOL 183 12/31/2019   LDLCALC 122 (H) 12/31/2019   HDL 36 (L) 12/31/2019   TRIG 141 12/31/2019   ALT 12 12/31/2019   AST 26 12/31/2019   PLT 127 (L) 11/17/2018    He was last seen for this 3 months ago.  Management since that visit includes patient advised to work on avoiding sweets and starchy foods in diet and exercise daily.   He reports good compliance with treatment. He is not having side effects.   Symptoms: No appetite changes No foot ulcerations No chest pain No chest pressure/discomfort No dyspnea No fatigue No lower extremity edema No nausea No numbness or tingling of extremity No orthopnea No palpitations No paroxysmal nocturnal dyspnea No polydipsia No polyuria No speech  difficulty No syncope No visual disturbances   He is following a {diet:21022986} diet. Current exercise: {exercise types:16438}  Wt Readings from Last 3 Encounters:  12/31/19 229 lb (103.9 kg)  12/07/18 251 lb 12.8 oz (114.2 kg)  11/17/18 260 lb (117.9 kg)   Last metabolic panel Lab Results  Component Value Date   GLUCOSE 147 (H) 12/31/2019   NA 136 12/31/2019   K 3.7 12/31/2019   BUN 10 12/31/2019   CREATININE 1.10 12/31/2019   GFRNONAA 73 12/31/2019   GFRAA 84 12/31/2019   CALCIUM 9.0 12/31/2019   AST 26 12/31/2019   ALT 12 12/31/2019   The 10-year ASCVD risk score (Goff DC Jr., et al., 2013) is: 10.9%  ----------------------------------------------------------------------------------------- Hypothyroid, follow-up  Lab Results  Component Value Date   TSH 5.420 (H) 12/31/2019   TSH 2.710 10/15/2018   TSH 0.38 (A) 07/29/2018   TSH 0.379 07/29/2018   T4TOTAL 7.2 06/20/2017   T4TOTAL 4.7 05/16/2017   Wt Readings from Last 3 Encounters:  12/31/19 229 lb (103.9 kg)  12/07/18 251 lb 12.8 oz (114.2 kg)  11/17/18 260 lb (117.9 kg)    He was last seen for hypothyroid 3 months ago.  Management since that visit includes no change. He reports good compliance with treatment. He is not having side effects.   Symptoms: No change in energy level No constipation No diarrhea No heat / cold intolerance No nervousness No palpitations No weight changes  -----------------------------------------------------------------------------------------   {Show patient history (optional):23778::" "}   Medications: Outpatient Medications Prior to Visit  Medication Sig  . buprenorphine-naloxone (SUBOXONE) 2-0.5  mg SUBL SL tablet Place 1-2 tablets See admin instructions under the tongue. Take 2 tablets by mouth in the morning, take 1 tablet by mouth midday and take 2 tablets by mouth at bedtime  . diphenoxylate-atropine (LOMOTIL) 2.5-0.025 MG per tablet Take 2 tablets by mouth every  6 (six) hours as needed for diarrhea or loose stools.   . ezetimibe (ZETIA) 10 MG tablet TAKE 1 TABLET BY MOUTH ONCE DAILY  . furosemide (LASIX) 40 MG tablet Take 1 tablet (40 mg total) by mouth 2 (two) times daily. (Patient taking differently: Take 40 mg daily by mouth. )  . lactulose (CHRONULAC) 10 GM/15ML solution TAKE 30 MLS BY MOUTH 3 TIMES DAILY (Patient taking differently: Take 20 gm by mouth daily)  . levothyroxine (SYNTHROID) 50 MCG tablet TAKE 1 TABLET BY MOUTH ONCE DAILY  . Melatonin 10 MG CAPS Take 10 mg at bedtime by mouth.  . metoprolol tartrate (LOPRESSOR) 25 MG tablet TAKE 1 TABLET BY MOUTH TWICE DAILY  . metroNIDAZOLE (METROCREAM) 0.75 % cream APPLY TOPICALLY AT BEDTIME  . mirtazapine (REMERON) 15 MG tablet Take 1 tablet (15 mg total) by mouth at bedtime.  [name] NARCAN 4 MG/0.1ML LIQD nasal spray kit   . nitroGLYCERIN (NITROSTAT) 0.4 MG SL tablet Place 0.4 mg under the tongue every 5 (five) minutes as needed for chest pain.  [name] nystatin cream (MYCOSTATIN) Apply 1 application 3 (three) times daily as needed topically (for rash).   [name] omeprazole (PRILOSEC) 20 MG capsule TAKE 2 CAPSULES BY MOUTH ONCE DAILY  . OXYGEN Inhale into the lungs.  . prazosin (MINIPRESS) 5 MG capsule Take 1 capsule (5 mg total) by mouth at bedtime.  [name] QUEtiapine (SEROQUEL) 300 MG tablet Take 1 tablet (300 mg total) by mouth at bedtime.  . ramelteon (ROZEREM) 8 MG tablet TAKE 1 TABLET BY MOUTH AT BEDTIME  . spironolactone (ALDACTONE) 50 MG tablet Take 1 tablet (50 mg total) by mouth daily. (Patient taking differently: Take 50 mg by mouth 2 (two) times daily. )  . tamsulosin (FLOMAX) 0.4 MG CAPS capsule TAKE 1 CAPSULE BY MOUTH ONCE EVERY EVENING  . triamcinolone cream (KENALOG) 0.1 % APPLY TOPICALLY TWICE DAILY AS NEEDED  . venlafaxine XR (EFFEXOR-XR) 150 MG 24 hr capsule Take 1 capsule (150 mg total) by mouth daily with breakfast.  . XIFAXAN 550 MG TABS tablet    Facility-Administered Medications Prior to Visit    Medication Dose Route Frequency Provider  . ipratropium-albuterol (DUONEB) 0.5-2.5 (3) MG/3ML nebulizer solution 3 mL  3 mL Nebulization Once Chrismon, Vickki Muff, PA    Review of Systems  Constitutional: Negative.   Respiratory: Negative.   Cardiovascular: Negative.   Musculoskeletal: Negative.     {Show previous labs (optional):23779::" "}  Objective    There were no vitals taken for this visit. {Show previous vital signs (optional):23777::" "}  Physical Exam  ***  No results found for any visits on 04/03/20.  Assessment & Plan     ***  No follow-ups on file.      {provider attestation***:1}   Lelon Huh, MD  [facility] [phone] (phone) [phone] (fax)  [place]

## 2020-04-03 ENCOUNTER — Ambulatory Visit: Payer: Self-pay | Admitting: Family Medicine

## 2020-04-20 ENCOUNTER — Telehealth (INDEPENDENT_AMBULATORY_CARE_PROVIDER_SITE_OTHER): Payer: Medicare Other | Admitting: Psychiatry

## 2020-04-20 ENCOUNTER — Other Ambulatory Visit: Payer: Self-pay

## 2020-04-20 ENCOUNTER — Encounter: Payer: Self-pay | Admitting: Psychiatry

## 2020-04-20 DIAGNOSIS — F4310 Post-traumatic stress disorder, unspecified: Secondary | ICD-10-CM | POA: Diagnosis not present

## 2020-04-20 DIAGNOSIS — F3342 Major depressive disorder, recurrent, in full remission: Secondary | ICD-10-CM

## 2020-04-20 MED ORDER — VENLAFAXINE HCL ER 75 MG PO CP24: 75.0000 mg | ORAL_CAPSULE | Freq: Every day | ORAL | 0 refills | Status: DC

## 2020-04-20 MED ORDER — QUETIAPINE FUMARATE 300 MG PO TABS: 300.0000 mg | ORAL_TABLET | Freq: Every day | ORAL | 0 refills | Status: DC

## 2020-04-20 MED ORDER — TRAZODONE HCL 50 MG PO TABS: ORAL_TABLET | ORAL | 0 refills | Status: DC

## 2020-04-20 MED ORDER — PRAZOSIN HCL 5 MG PO CAPS: 5.0000 mg | ORAL_CAPSULE | Freq: Every day | ORAL | 0 refills | Status: DC

## 2020-04-20 MED ORDER — VENLAFAXINE HCL ER 150 MG PO CP24: 150.0000 mg | ORAL_CAPSULE | Freq: Every day | ORAL | 0 refills | Status: DC

## 2020-04-20 NOTE — Progress Notes (Addendum)
[place] MD OP Progress Note Virtual Visit via Video Note  I connected with Ray Robles on 04/20/20 at  2:30 PM EDT by a video enabled telemedicine application and verified that I am speaking with the correct person using two identifiers.  Location: Patient: Home Provider: Clinic   I discussed the limitations of evaluation and management by telemedicine and the availability of in person appointments. The patient expressed understanding and agreed to proceed.  I provided 22  minutes of non-face-to-face time during this encounter.     04/20/2020 2:34 PM Ray Robles  MRN:  [id number]  Chief Complaint:  " I am dealing with a lot of depression."  HPI:  Pt reported that he has been feeling depressed. He reports that Effexor at its current does have not been effective at managing his depressive symptoms. He reports that he has deceased energy and poor sleep. He states that when I'm out I don't want to be out. During assessment patient observed sitting in bed in a dim lit room. Provider recommended that Effexor be increased to 225 mg daily to improve depressive symptoms. Patient was agreeable to adjustments. Provider instructed patient to discontinue Mirtazapine as it si no longer helpful with sleep. He was offered Trazodone 50 mg for sleep. Patient agreeable to adjustments. No other concerns noted at this time.  PDMP reviewed during this encounter. He is still taking Suboxone TID for opioid maintenance therapy.   Visit Diagnosis:    ICD-10-CM   1. MDD (major depressive disorder), recurrent, in full remission ([place])  F33.42 QUEtiapine (SEROQUEL) 300 MG tablet    venlafaxine XR (EFFEXOR-XR) 150 MG 24 hr capsule    venlafaxine XR (EFFEXOR-XR) 75 MG 24 hr capsule    traZODone (DESYREL) 50 MG tablet  2. PTSD (post-traumatic stress disorder)  F43.10 prazosin (MINIPRESS) 5 MG capsule    venlafaxine XR (EFFEXOR-XR) 150 MG 24 hr capsule    venlafaxine XR (EFFEXOR-XR) 75 MG 24 hr capsule    Past  Psychiatric History: MDD, anxiety, PTSD  Past Medical History:  Past Medical History:  Diagnosis Date  . Anxiety   . Arthritis   . Cirrhosis ([place])   . Depression   . Dyspnea    doe  . Dysrhythmia   . GERD (gastroesophageal reflux disease)    food sticks in throat has esophagus stretched  . H/O bacterial pneumonia 11/11/2007   2008 and 2015   . Heart murmur   . Hepatitis C   . Hx of endoscopy    05/12/2014- [facility]. Dr Candace Cruise. Benign appearing stricture. Dilated the examination was otherwise normal.02/13/2012- Beniign appearing intrinsic mild stenosis at GE junction, successfully dialated  . Hypertension   . Hypothyroidism   . Liver disease   . Neuromuscular disorder ([place])    chronic pain peripheral neuropathy  . Pre-diabetes   . PTSD (post-traumatic stress disorder)     Past Surgical History:  Procedure Laterality Date  . COLONOSCOPY WITH PROPOFOL N/A 05/12/2015   Procedure: COLONOSCOPY WITH PROPOFOL;  Surgeon: Hulen Luster, MD;  Location: [facility] ENDOSCOPY;  Service: Gastroenterology;  Laterality: N/A;  . edg    . HERNIA REPAIR  08/2012   Dr. Jamal Collin; [facility]  . INGUINAL HERNIA REPAIR Right 10/28/2017   Procedure: HERNIA REPAIR INGUINAL ADULT;  Surgeon: Leonie Green, MD;  Location: [facility] ORS;  Service: General;  Laterality: Right;    Family Psychiatric History: alcohol abuse- brother  Family History:  Family History  Problem Relation Age of Onset  . Cancer  Mother   . Cancer Father   . Alcohol abuse Brother   . Alcohol abuse Brother   . Drug abuse Brother     Social History:  Social History   Socioeconomic History  . Marital status: Married    Spouse name: Maralyn Sago  . Number of children: 4  . Years of education: Not on file  . Highest education level: Associate degree: occupational, Hotel manager, or vocational program  Occupational History    Comment: retired  Tobacco Use  . Smoking status: Never Smoker  . Smokeless tobacco: Never Used  Substance and Sexual Activity   . Alcohol use: No    Alcohol/week: 0.0 standard drinks  . Drug use: Not Currently    Types: Oxycodone    Comment: none since 3/18  . Sexual activity: Not Currently  Other Topics Concern  . Not on file  Social History Narrative  . Not on file   Social Determinants of Health   Financial Resource Strain:   . Difficulty of Paying Living Expenses:   Food Insecurity:   . Worried About Charity fundraiser in the Last Year:   . Arboriculturist in the Last Year:   Transportation Needs:   . Film/video editor (Medical):   [name] Lack of Transportation (Non-Medical):   Physical Activity:   . Days of Exercise per Week:   . Minutes of Exercise per Session:   Stress:   . Feeling of Stress :   Social Connections:   . Frequency of Communication with Friends and Family:   . Frequency of Social Gatherings with Friends and Family:   . Attends Religious Services:   . Active Member of Clubs or Organizations:   . Attends Archivist Meetings:   [name] Marital Status:     Allergies:  Allergies  Allergen Reactions  . Ambien [Zolpidem Tartrate] Other (See Comments)    Reaction: Violent sleep  . Ibuprofen Other (See Comments)    Can't take because of liver  . Morphine Diarrhea and Nausea And Vomiting  . Morphine And Related Diarrhea and Nausea And Vomiting  . Tylenol [Acetaminophen] Other (See Comments)    Can't take because of liver  . Xanax [Alprazolam] Other (See Comments)    Brings the ammonia levels up  . Zaleplon     seizures  . Zolpidem Other (See Comments)    Reaction: Violent Sleep    Metabolic Disorder Labs: Lab Results  Component Value Date   HGBA1C 5.4 01/10/2017   No results found for: PROLACTIN Lab Results  Component Value Date   CHOL 183 12/31/2019   TRIG 141 12/31/2019   HDL 36 (L) 12/31/2019   CHOLHDL 5.1 (H) 12/31/2019   LDLCALC 122 (H) 12/31/2019   LDLCALC 127 (H) 10/15/2018   Lab Results  Component Value Date   TSH 5.420 (H) 12/31/2019   TSH  2.710 10/15/2018    Therapeutic Level Labs: Lab Results  Component Value Date   LITHIUM 1.19 01/12/2017   No results found for: VALPROATE No components found for:  CBMZ  Current Medications: Current Outpatient Medications  Medication Sig Dispense Refill  . buprenorphine-naloxone (SUBOXONE) 2-0.5 mg SUBL SL tablet Place 1-2 tablets See admin instructions under the tongue. Take 2 tablets by mouth in the morning, take 1 tablet by mouth midday and take 2 tablets by mouth at bedtime    . diphenoxylate-atropine (LOMOTIL) 2.5-0.025 MG per tablet Take 2 tablets by mouth every 6 (six) hours as needed for diarrhea or  loose stools.     . ezetimibe (ZETIA) 10 MG tablet TAKE 1 TABLET BY MOUTH ONCE DAILY 30 tablet 11  . furosemide (LASIX) 40 MG tablet Take 1 tablet (40 mg total) by mouth 2 (two) times daily. (Patient taking differently: Take 40 mg daily by mouth. ) 1 tablet 1  . lactulose (CHRONULAC) 10 GM/15ML solution TAKE 30 MLS BY MOUTH 3 TIMES DAILY (Patient taking differently: Take 20 gm by mouth daily) 240 mL 12  . levothyroxine (SYNTHROID) 50 MCG tablet TAKE 1 TABLET BY MOUTH ONCE DAILY 30 tablet 5  . Melatonin 10 MG CAPS Take 10 mg at bedtime by mouth.    . metoprolol tartrate (LOPRESSOR) 25 MG tablet TAKE 1 TABLET BY MOUTH TWICE DAILY 60 tablet 12  . metroNIDAZOLE (METROCREAM) 0.75 % cream APPLY TOPICALLY AT BEDTIME 45 g 3  . mirtazapine (REMERON) 15 MG tablet Take 1 tablet (15 mg total) by mouth at bedtime. 90 tablet 0  . NARCAN 4 MG/0.1ML LIQD nasal spray kit     . nitroGLYCERIN (NITROSTAT) 0.4 MG SL tablet Place 0.4 mg under the tongue every 5 (five) minutes as needed for chest pain.    [name] nystatin cream (MYCOSTATIN) Apply 1 application 3 (three) times daily as needed topically (for rash).     [name] omeprazole (PRILOSEC) 20 MG capsule TAKE 2 CAPSULES BY MOUTH ONCE DAILY 60 capsule 12  . OXYGEN Inhale into the lungs.    . prazosin (MINIPRESS) 5 MG capsule Take 1 capsule (5 mg total) by mouth at  bedtime. 90 capsule 0  . QUEtiapine (SEROQUEL) 300 MG tablet Take 1 tablet (300 mg total) by mouth at bedtime. 90 tablet 0  . spironolactone (ALDACTONE) 50 MG tablet Take 1 tablet (50 mg total) by mouth daily. (Patient taking differently: Take 50 mg by mouth 2 (two) times daily. ) 30 tablet 0  . tamsulosin (FLOMAX) 0.4 MG CAPS capsule TAKE 1 CAPSULE BY MOUTH ONCE EVERY EVENING 30 capsule 12  . traZODone (DESYREL) 50 MG tablet Take 1 to 2 tablets at bedtime as needed for sleep 150 tablet 0  . triamcinolone cream (KENALOG) 0.1 % APPLY TOPICALLY TWICE DAILY AS NEEDED 30 g 2  . venlafaxine XR (EFFEXOR-XR) 150 MG 24 hr capsule Take 1 capsule (150 mg total) by mouth daily with breakfast. 90 capsule 0  . venlafaxine XR (EFFEXOR-XR) 75 MG 24 hr capsule Take 1 capsule (75 mg total) by mouth daily with breakfast. 90 capsule 0  . XIFAXAN 550 MG TABS tablet      Current Facility-Administered Medications  Medication Dose Route Frequency Provider Last Rate Last Admin  . ipratropium-albuterol (DUONEB) 0.5-2.5 (3) MG/3ML nebulizer solution 3 mL  3 mL Nebulization Once Chrismon, Vickki Muff, PA         Psychiatric Specialty Exam: ROS  There were no vitals taken for this visit.There is no height or weight on file to calculate BMI.  General Appearance: Fairly groomed  Eye Contact:   good  Speech:  Clear and Coherent and Normal Rate  Volume:  Normal  Mood:  Depressed  Affect:  Congruent  Thought Process:  Goal Directed, Linear and Descriptions of Associations: Intact  Orientation:  Full (Time, Place, and Person)  Thought Content: Logical   Suicidal Thoughts:  No  Homicidal Thoughts:  No  Memory:  Recent;   Good Remote;   Good  Judgement:  Fair  Insight:  Fair  Psychomotor Activity:  Normal  Concentration:  Concentration: Fair and Attention Span:  Fair  Recall:  Roel Cluck of Knowledge: Good  Language: Good  Akathisia:  Negative  Handed:  Right  AIMS (if indicated): not done  Assets:  Communication  Skills Desire for Improvement Financial Resources/Insurance Social Support Transportation  ADL's:  Intact  Cognition: WNL  Sleep:  Poor   Screenings: PHQ2-9     Office Visit from 09/11/2015 in [facility]  PHQ-2 Total Score  3  PHQ-9 Total Score  16       Assessment and Plan: 59 y/o male with hx of depression, PTSD and anxiety reports that he is feeling depressed. He also reports that he has poor sleep. Patient agreeable to increase Effexor to 225 mg daily. He also agreeable to discontinue mirtazapine for sleep and start Trazodone 50 mg to optimize sleep. Potential side effects of medication and risks vs benefits of treatment vs non-treatment were explained and discussed. All questions were answered.    1. MDD (major depressive disorder), recurrent, in full remission ([place])  - QUEtiapine (SEROQUEL) 300 MG tablet; Take 1 tablet (300 mg total) by mouth at bedtime.  Dispense: 90 tablet; Refill: 0 - Increase venlafaxine XR (EFFEXOR-XR) 150 MG 24 hr capsule; Take 1 capsule (150 mg total) by mouth daily with breakfast.  Dispense: 90 capsule; Refill: 0 - venlafaxine XR (EFFEXOR-XR) 75 MG 24 hr capsule; Take 1 capsule (75 mg total) by mouth daily with breakfast.  Dispense: 90 capsule; Refill: 0 - Start traZODone (DESYREL) 50 MG tablet; Take 1 to 2 tablets at bedtime as needed for sleep  Dispense: 150 tablet; Refill: 0  2. PTSD (post-traumatic stress disorder)  - prazosin (MINIPRESS) 5 MG capsule; Take 1 capsule (5 mg total) by mouth at bedtime.  Dispense: 90 capsule; Refill: 0 - Increase venlafaxine XR (EFFEXOR-XR) 150 MG 24 hr capsule; Take 1 capsule (150 mg total) by mouth daily with breakfast.  Dispense: 90 capsule; Refill: 0 - venlafaxine XR (EFFEXOR-XR) 75 MG 24 hr capsule; Take 1 capsule (75 mg total) by mouth daily with breakfast.  Dispense: 90 capsule; Refill: 0  F/up in 2 months.    Salley Slaughter, NP 04/20/2020, 2:34 PM    I saw and managed the patient  with NP B. Ronne Binning.  [place] Crane, MD 04/20/2020 2:53 PM

## 2020-04-20 NOTE — Addendum Note (Signed)
Addended by: [place] Crane on: 04/20/2020 02:53 PM   Modules accepted: Level of Service

## 2020-04-24 ENCOUNTER — Ambulatory Visit: Payer: Medicare Other | Admitting: Family Medicine

## 2020-04-28 ENCOUNTER — Encounter: Payer: Self-pay | Admitting: Family Medicine

## 2020-04-28 ENCOUNTER — Other Ambulatory Visit: Payer: Self-pay

## 2020-04-28 ENCOUNTER — Ambulatory Visit (INDEPENDENT_AMBULATORY_CARE_PROVIDER_SITE_OTHER): Payer: Medicare Other | Admitting: Family Medicine

## 2020-04-28 VITALS — BP 120/64 | HR 84 | Temp 96.9°F | Resp 18 | Ht 71.0 in | Wt 223.0 lb

## 2020-04-28 DIAGNOSIS — R7303 Prediabetes: Secondary | ICD-10-CM

## 2020-04-28 DIAGNOSIS — E039 Hypothyroidism, unspecified: Secondary | ICD-10-CM

## 2020-04-28 DIAGNOSIS — F3342 Major depressive disorder, recurrent, in full remission: Secondary | ICD-10-CM | POA: Diagnosis not present

## 2020-04-28 DIAGNOSIS — R5383 Other fatigue: Secondary | ICD-10-CM | POA: Diagnosis not present

## 2020-04-28 LAB — POCT GLYCOSYLATED HEMOGLOBIN (HGB A1C)
Est. average glucose Bld gHb Est-mCnc: 143
Hemoglobin A1C: 6.6 % — AB (ref 4.0–5.6)

## 2020-04-28 MED ORDER — LEVOTHYROXINE SODIUM 50 MCG PO TABS: 75.0000 ug | ORAL_TABLET | Freq: Every day | ORAL | Status: DC

## 2020-04-28 NOTE — Patient Instructions (Signed)
.   Please review the attached list of medications and notify my office if there are any errors.   . You can increase levothyroxine to 4mcg daily. You can take 1 1/2 tablets of the 66mcg tablets until they are gone. I'll send a prescription for 74mcg tablets before your current prescription runs out

## 2020-04-28 NOTE — Progress Notes (Signed)
I,Roshena L Chambers,acting as a scribe for Lelon Huh, MD.,have documented all relevant documentation on the behalf of Lelon Huh, MD,as directed by  Lelon Huh, MD while in the presence of Lelon Huh, MD.   Established patient visit   Patient: Ray Robles   DOB: Apr 11, 1961   59 y.o. Male  MRN: [id number] Visit Date: 04/28/2020  Today's healthcare provider: Lelon Huh, MD   Chief Complaint  Patient presents with  . Hypertension  . Hyperlipidemia  . Hypothyroidism   Subjective    HPI   Prediabetes, Follow-up  Lab Results  Component Value Date   HGBA1C 6.6 (A) 04/28/2020   HGBA1C 5.4 01/10/2017   HGBA1C 5.9 03/19/2015   GLUCOSE 147 (H) 12/31/2019   GLUCOSE 117 (H) 06/10/2018   GLUCOSE 151 (H) 10/21/2017    --------------------------------------------------------------------------------------------------- Hypothyroid, follow-up  Lab Results  Component Value Date   TSH 5.420 (H) 12/31/2019   TSH 2.710 10/15/2018   TSH 0.38 (A) 07/29/2018   TSH 0.379 07/29/2018   T4TOTAL 7.2 06/20/2017   T4TOTAL 4.7 05/16/2017   Wt Readings from Last 3 Encounters:  04/28/20 223 lb (101.2 kg)  12/31/19 229 lb (103.9 kg)  12/07/18 251 lb 12.8 oz (114.2 kg)    He was last seen for hypothyroid 4 months ago.  Management since that visit includes no change. He reports good compliance with treatment. He is not having side effects.   Symptoms: Yes change in energy level Yes constipation  No diarrhea No heat / cold intolerance  No nervousness No palpitations  No weight changes    -----------------------------------------------------------------------------------------   Medications: Outpatient Medications Prior to Visit  Medication Sig  . buprenorphine-naloxone (SUBOXONE) 2-0.5 mg SUBL SL tablet Place 1-2 tablets See admin instructions under the tongue. Take 2 tablets by mouth in the morning, take 1 tablet by mouth midday and take 2 tablets by mouth at  bedtime  . diphenoxylate-atropine (LOMOTIL) 2.5-0.025 MG per tablet Take 2 tablets by mouth every 6 (six) hours as needed for diarrhea or loose stools.   . ezetimibe (ZETIA) 10 MG tablet TAKE 1 TABLET BY MOUTH ONCE DAILY  . furosemide (LASIX) 40 MG tablet Take 1 tablet (40 mg total) by mouth 2 (two) times daily. (Patient taking differently: Take 40 mg daily by mouth. )  . lactulose (CHRONULAC) 10 GM/15ML solution TAKE 30 MLS BY MOUTH 3 TIMES DAILY (Patient taking differently: Take 20 gm by mouth daily)  . levothyroxine (SYNTHROID) 50 MCG tablet TAKE 1 TABLET BY MOUTH ONCE DAILY  . Melatonin 10 MG CAPS Take 10 mg at bedtime by mouth.  . metoprolol tartrate (LOPRESSOR) 25 MG tablet TAKE 1 TABLET BY MOUTH TWICE DAILY  . metroNIDAZOLE (METROCREAM) 0.75 % cream APPLY TOPICALLY AT BEDTIME  . mirtazapine (REMERON) 15 MG tablet Take 1 tablet (15 mg total) by mouth at bedtime.  [name] NARCAN 4 MG/0.1ML LIQD nasal spray kit   . nitroGLYCERIN (NITROSTAT) 0.4 MG SL tablet Place 0.4 mg under the tongue every 5 (five) minutes as needed for chest pain.  [name] nystatin cream (MYCOSTATIN) Apply 1 application 3 (three) times daily as needed topically (for rash).   [name] omeprazole (PRILOSEC) 20 MG capsule TAKE 2 CAPSULES BY MOUTH ONCE DAILY  . OXYGEN Inhale into the lungs.  . prazosin (MINIPRESS) 5 MG capsule Take 1 capsule (5 mg total) by mouth at bedtime.  [name] QUEtiapine (SEROQUEL) 300 MG tablet Take 1 tablet (300 mg total) by mouth at bedtime.  [name] spironolactone (ALDACTONE)  50 MG tablet Take 1 tablet (50 mg total) by mouth daily. (Patient taking differently: Take 50 mg by mouth 2 (two) times daily. )  . tamsulosin (FLOMAX) 0.4 MG CAPS capsule TAKE 1 CAPSULE BY MOUTH ONCE EVERY EVENING  . traZODone (DESYREL) 50 MG tablet Take 1 to 2 tablets at bedtime as needed for sleep  . triamcinolone cream (KENALOG) 0.1 % APPLY TOPICALLY TWICE DAILY AS NEEDED  . venlafaxine XR (EFFEXOR-XR) 150 MG 24 hr capsule Take 1 capsule (150 mg total)  by mouth daily with breakfast.  . venlafaxine XR (EFFEXOR-XR) 75 MG 24 hr capsule Take 1 capsule (75 mg total) by mouth daily with breakfast.  . XIFAXAN 550 MG TABS tablet    Facility-Administered Medications Prior to Visit  Medication Dose Route Frequency Provider  . ipratropium-albuterol (DUONEB) 0.5-2.5 (3) MG/3ML nebulizer solution 3 mL  3 mL Nebulization Once Chrismon, Vickki Muff, PA    Review of Systems  Constitutional: Positive for fatigue. Negative for appetite change, chills and fever.  Respiratory: Positive for shortness of breath. Negative for chest tightness and wheezing.   Cardiovascular: Negative for chest pain and palpitations.  Gastrointestinal: Negative for abdominal pain, nausea and vomiting.  Musculoskeletal: Positive for arthralgias and back pain.     Objective    BP 120/64 (BP Location: Left Arm, Patient Position: Sitting, Cuff Size: Large)   Pulse 84   Temp (!) 96.9 F (36.1 C) (Temporal)   Resp 18   Ht '5\' 11"'$  (1.803 m)   Wt 223 lb (101.2 kg)   SpO2 99% Comment: room air  BMI 31.10 kg/m   Physical Exam  General appearance: Obese male, cooperative and in no acute distress Head: Normocephalic, without obvious abnormality, atraumatic Respiratory: Respirations even and unlabored, normal respiratory rate Extremities: All extremities are intact.  Skin: Skin color, texture, turgor normal. No rashes seen  Psych: Appropriate mood and affect. Neurologic: Mental status: Alert, oriented to person, place, and time, thought content appropriate.   Results for orders placed or performed in visit on 04/28/20  POCT glycosylated hemoglobin (Hb A1C)  Result Value Ref Range   Hemoglobin A1C 6.6 (A) 4.0 - 5.6 %   Est. average glucose Bld gHb Est-mCnc 143    PHQ9 SCORE ONLY 04/28/2020 09/11/2015  PHQ-9 Total Score 17 16     Assessment & Plan     1. Prediabetes He does admit to drinking several soft drinks every day. Counseled to keep cut this down to 1 or 2.   2.  Other fatigue Likely multifactorial and exacerbated by hypothyroid and depression as below.   3. Major depression He states his psychiatrist recently increased venlafaxine to 225 mg daily which he is tolerating well  3. Hypothyroidism, unspecified type Likely contributing to fatigue and maybe to depression. Will increase to - levothyroxine (SYNTHROID) 50 MCG tablet; Take 1.5 tablets (75 mcg total) by mouth daily. [name] He will be out in 2-3 weeks, will send 19mg prescription before then.        The entirety of the information documented in the History of Present Illness, Review of Systems and Physical Exam were personally obtained by me. Portions of this information were initially documented by the CMA and reviewed by me for thoroughness and accuracy.      DLelon Huh MD  BPetaluma Valley [phone](phone) [phone](fax)  [place]

## 2020-05-17 ENCOUNTER — Other Ambulatory Visit: Payer: Self-pay | Admitting: Family Medicine

## 2020-05-17 DIAGNOSIS — E039 Hypothyroidism, unspecified: Secondary | ICD-10-CM

## 2020-05-17 MED ORDER — LEVOTHYROXINE SODIUM 75 MCG PO TABS: 75.0000 ug | ORAL_TABLET | Freq: Every day | ORAL | 3 refills | Status: DC

## 2020-05-22 ENCOUNTER — Other Ambulatory Visit: Payer: Self-pay | Admitting: Family Medicine

## 2020-05-22 DIAGNOSIS — E785 Hyperlipidemia, unspecified: Secondary | ICD-10-CM

## 2020-06-23 NOTE — Progress Notes (Signed)
Established patient visit   Patient: Ray Robles   DOB: 1961/06/16   59 y.o. Male  MRN: [id number] Visit Date: 06/26/2020  Today's healthcare provider: Lelon Huh, MD   Chief Complaint  Patient presents with  . Medication Problem   Subjective    HPI  Pt comes in to discuss when he was on Oxycodone in the past.  He states he is making a claim in a class action lawsuit regarding opioid use and needs all the dates that he was on opioids.   He is also here to follow up in fatigue and hypothyroid since increase levothyroxine dose in April. He is also taking vitamin B12 supplements and still feels extremely fatigued. He also has severe sleep apnea managed by sleep specialist (he thinks it is Dr. Chancy Milroy) He has Bipap but states he is not able to sleep when he wears it so is not able to use it consistently. .     Medications: Outpatient Medications Prior to Visit  Medication Sig  . buprenorphine-naloxone (SUBOXONE) 2-0.5 mg SUBL SL tablet Place 1-2 tablets See admin instructions under the tongue. Take 2 tablets by mouth in the morning, take 1 tablet by mouth midday and take 2 tablets by mouth at bedtime  . diphenoxylate-atropine (LOMOTIL) 2.5-0.025 MG per tablet Take 2 tablets by mouth every 6 (six) hours as needed for diarrhea or loose stools.   . ezetimibe (ZETIA) 10 MG tablet TAKE 1 TABLET BY MOUTH ONCE DAILY  . furosemide (LASIX) 40 MG tablet Take 1 tablet (40 mg total) by mouth 2 (two) times daily. (Patient taking differently: Take 40 mg daily by mouth. )  . lactulose (CHRONULAC) 10 GM/15ML solution TAKE 30 MLS BY MOUTH 3 TIMES DAILY (Patient taking differently: Take 20 gm by mouth daily)  . levothyroxine (SYNTHROID) 75 MCG tablet Take 1 tablet (75 mcg total) by mouth daily.  . Melatonin 10 MG CAPS Take 10 mg at bedtime by mouth.  . metoprolol tartrate (LOPRESSOR) 25 MG tablet TAKE 1 TABLET BY MOUTH TWICE DAILY  . metroNIDAZOLE (METROCREAM) 0.75 % cream APPLY TOPICALLY AT  BEDTIME  . mirtazapine (REMERON) 15 MG tablet Take 1 tablet (15 mg total) by mouth at bedtime.  [name] NARCAN 4 MG/0.1ML LIQD nasal spray kit   . nitroGLYCERIN (NITROSTAT) 0.4 MG SL tablet Place 0.4 mg under the tongue every 5 (five) minutes as needed for chest pain.  [name] nystatin cream (MYCOSTATIN) Apply 1 application 3 (three) times daily as needed topically (for rash).   [name] omeprazole (PRILOSEC) 20 MG capsule TAKE 2 CAPSULES BY MOUTH ONCE DAILY  . OXYGEN Inhale into the lungs.  . prazosin (MINIPRESS) 5 MG capsule Take 1 capsule (5 mg total) by mouth at bedtime.  [name] QUEtiapine (SEROQUEL) 300 MG tablet Take 1 tablet (300 mg total) by mouth at bedtime.  [name] spironolactone (ALDACTONE) 50 MG tablet Take 1 tablet (50 mg total) by mouth daily. (Patient taking differently: Take 50 mg by mouth 2 (two) times daily. )  . tamsulosin (FLOMAX) 0.4 MG CAPS capsule TAKE 1 CAPSULE BY MOUTH ONCE EVERY EVENING  . traZODone (DESYREL) 50 MG tablet Take 1 to 2 tablets at bedtime as needed for sleep  . triamcinolone cream (KENALOG) 0.1 % APPLY TOPICALLY TWICE DAILY AS NEEDED  . venlafaxine XR (EFFEXOR-XR) 150 MG 24 hr capsule Take 1 capsule (150 mg total) by mouth daily with breakfast.  . venlafaxine XR (EFFEXOR-XR) 75 MG 24 hr capsule Take 1 capsule (75 mg  total) by mouth daily with breakfast.  . Vitamin D, Ergocalciferol, (DRISDOL) 1.25 MG (50000 UNIT) CAPS capsule Take 50,000 Units by mouth every 7 (seven) days.  . XIFAXAN 550 MG TABS tablet    Facility-Administered Medications Prior to Visit  Medication Dose Route Frequency Provider  . ipratropium-albuterol (DUONEB) 0.5-2.5 (3) MG/3ML nebulizer solution 3 mL  3 mL Nebulization Once Chrismon, Dennis E, PA    Review of Systems  Constitutional: Positive for fatigue. Negative for activity change, appetite change, chills, diaphoresis, fever and unexpected weight change.  Respiratory: Negative.   Cardiovascular: Positive for leg swelling. Negative for chest pain and  palpitations.  Gastrointestinal: Negative.   Neurological: Negative for dizziness, light-headedness and headaches.      Objective    There were no vitals taken for this visit.  Physical Exam  General appearance: Obese male, cooperative and in no acute distress Head: Normocephalic, without obvious abnormality, atraumatic Respiratory: Respirations even and unlabored, normal respiratory rate Extremities: All extremities are intact.  Skin: Skin color, texture, turgor normal. No rashes seen  Psych: Appropriate mood and affect. Neurologic: Mental status: Alert, oriented to person, place, and time, thought content appropriate.   No results found for any visits on 06/26/20.  Assessment & Plan     1. Other fatigue/hypothyroid Multifactorial.suspect uncontrolled OSA is major factor. May be related to hypothyroid.  - T4, free - TSH - CBC  2. Sleep apnea, unspecified type Not tolerating or able to use BiPap consistently. Advised he needs to discuss this with his Sleep Specialist.   3. Opioid use disorder, moderate, in sustained remission (HCC) Was dependent on long-acting opioids for many years for control of chronic back pain. No off opioid and managed at pain clinic. He needs letter detailing dates of opioid use and dependence for class action pharmaceutical lawsuit.       The entirety of the information documented in the History of Present Illness, Review of Systems and Physical Exam were personally obtained by me. Portions of this information were initially documented by the CMA and reviewed by me for thoroughness and accuracy.       , MD  [facility] [phone] (phone) [phone] (fax)  [facility]  

## 2020-06-26 ENCOUNTER — Encounter: Payer: Self-pay | Admitting: Family Medicine

## 2020-06-26 ENCOUNTER — Ambulatory Visit (INDEPENDENT_AMBULATORY_CARE_PROVIDER_SITE_OTHER): Payer: Medicare Other | Admitting: Family Medicine

## 2020-06-26 ENCOUNTER — Other Ambulatory Visit: Payer: Self-pay

## 2020-06-26 VITALS — BP 134/80 | HR 84 | Temp 96.9°F | Wt 225.0 lb

## 2020-06-26 DIAGNOSIS — E039 Hypothyroidism, unspecified: Secondary | ICD-10-CM

## 2020-06-26 DIAGNOSIS — F1121 Opioid dependence, in remission: Secondary | ICD-10-CM | POA: Diagnosis not present

## 2020-06-26 DIAGNOSIS — R5383 Other fatigue: Secondary | ICD-10-CM

## 2020-06-26 DIAGNOSIS — G4730 Sleep apnea, unspecified: Secondary | ICD-10-CM | POA: Diagnosis not present

## 2020-06-27 ENCOUNTER — Telehealth: Payer: Self-pay

## 2020-06-27 LAB — CBC
Hematocrit: 37.6 % (ref 37.5–51.0)
Hemoglobin: 13.1 g/dL (ref 13.0–17.7)
MCH: 30.2 pg (ref 26.6–33.0)
MCHC: 34.8 g/dL (ref 31.5–35.7)
MCV: 87 fL (ref 79–97)
Platelets: 126 10*3/uL — ABNORMAL LOW (ref 150–450)
RBC: 4.34 x10E6/uL (ref 4.14–5.80)
RDW: 11.7 % (ref 11.6–15.4)
WBC: 4.1 10*3/uL (ref 3.4–10.8)

## 2020-06-27 LAB — TSH: TSH: 5.03 u[IU]/mL — ABNORMAL HIGH (ref 0.450–4.500)

## 2020-06-27 LAB — T4, FREE: Free T4: 0.94 ng/dL (ref 0.82–1.77)

## 2020-06-27 NOTE — Telephone Encounter (Signed)
-----   Message from Birdie Sons, MD sent at 06/27/2020  9:16 AM EDT ----- Labs show is he hypothyroid, which might make him a little fatigued, but I think it's mainly due to his sleep apnea.  He can start levothyroxine 50 mcg one tablet daily, #90, rf x 1. Schedule follow up for hypothyroid about 10 weeks (mid October) He needs to follow up with his sleep specialist about apnea and CPAP machinge.

## 2020-06-27 NOTE — Telephone Encounter (Signed)
LMTCB 06/27/2020.  PEC please advise pt of lab results below.   Thanks,   -Mickel Baas

## 2020-06-28 NOTE — Telephone Encounter (Signed)
Patient called, left VM to return the call to the office for results.  

## 2020-06-29 ENCOUNTER — Telehealth: Payer: Self-pay | Admitting: Family Medicine

## 2020-06-29 NOTE — Telephone Encounter (Signed)
Attempted to call pt. Left vm to return call to office to discuss lab results/ recommendations per Dr. [name].

## 2020-06-29 NOTE — Telephone Encounter (Signed)
Copied from [place] [id number]. Topic: Medicare AWV >> Jun 29, 2020  1:39 PM Cher Nakai R wrote: Reason for CRM:  Left message for patient to call back and schedule Medicare Annual Wellness Visit (AWV) either virtually or in office.  No hx of AWV eligible as of 12/02/2009  Please schedule at anytime with [facility] Health Advisor.

## 2020-06-30 NOTE — Telephone Encounter (Signed)
Attempted call at this time.  Left vm to return call to office re: lab results/ recommendations.  Multiple attempts have been made to contact patient.

## 2020-07-04 MED ORDER — LEVOTHYROXINE SODIUM 88 MCG PO TABS
88.0000 ug | ORAL_TABLET | Freq: Every day | ORAL | 0 refills | Status: DC
Start: 2020-07-04 — End: 2020-11-15

## 2020-07-04 NOTE — Telephone Encounter (Signed)
Patient's wife advised. Patient is taking Levothyroxine 75 mcg. Per Dr. [name] patient needs to increase Levothyroxine to 88 mcg. RX sent to pharmacy.

## 2020-07-04 NOTE — Addendum Note (Signed)
Addended by: Jules Schick on: 07/04/2020 02:00 PM   Modules accepted: Orders

## 2020-07-24 ENCOUNTER — Other Ambulatory Visit: Payer: Self-pay | Admitting: Psychiatry

## 2020-07-24 DIAGNOSIS — F4310 Post-traumatic stress disorder, unspecified: Secondary | ICD-10-CM

## 2020-07-24 DIAGNOSIS — F3342 Major depressive disorder, recurrent, in full remission: Secondary | ICD-10-CM

## 2020-07-25 ENCOUNTER — Telehealth: Payer: Self-pay

## 2020-07-25 NOTE — Telephone Encounter (Signed)
I called and spoke with patient. Patient states he spoke with Dr. [name] during his last office visit about witting a letter that stated he was taking Oxycodone/ OxyContin for many years due to his medical condition. Patient is needing this letter to provide to the courts in regards to  A pharmacuetical lawsuit against the makers of this medication. Patient would like [place] to call him with an update on when the letter is ready for pick up.

## 2020-07-25 NOTE — Telephone Encounter (Signed)
Copied from [place] [id number]. Topic: General - Other >> Jul 25, 2020 12:47 PM Keene Breath wrote: Reason for CRM: Patient would like the nurse or doctor to call him regarding a follow up letter that he said he was supposed to get regarding his medication.  CB# [phone]

## 2020-07-26 NOTE — Telephone Encounter (Signed)
Letter is finished on my work station.

## 2020-07-26 NOTE — Telephone Encounter (Signed)
Patient advised. Letter placed up front for pick up.

## 2020-07-28 ENCOUNTER — Telehealth: Payer: Self-pay | Admitting: *Deleted

## 2020-07-28 NOTE — Chronic Care Management (AMB) (Signed)
  Chronic Care Management   Outreach Note  07/28/2020 Name: Ray Robles MRN: [id number] DOB: 1961-06-12  [name] is a 59 y.o. year old male who is a primary care patient of [name], Kirstie Peri, MD. I reached out to [name] by phone today in response to a referral sent by [name]'s health plan.     An unsuccessful telephone outreach was attempted today. The patient was referred to the case management team for assistance with care management and care coordination.   Follow Up Plan: A HIPPA compliant phone message was left for the patient providing contact information and requesting a return call. The care management team will reach out to the patient again over the next 7-14 days. If patient returns call to provider office, please advise to call [place] at [phone].  [place] Management

## 2020-07-31 ENCOUNTER — Ambulatory Visit: Payer: Self-pay | Admitting: Family Medicine

## 2020-08-04 ENCOUNTER — Telehealth: Payer: Self-pay | Admitting: Family Medicine

## 2020-08-04 NOTE — Telephone Encounter (Signed)
Copied from [place] [id number]. Topic: Medicare AWV >> Aug 04, 2020  3:58 PM Cher Nakai R wrote: Reason for CRM: Left message for patient to call back and schedule Medicare Annual Wellness Visit (AWV) either virtually or in office.  No hx of AWV eligible as of 12/02/2009  please schedule at anytime with [facility] Health Advisor.

## 2020-08-08 ENCOUNTER — Other Ambulatory Visit: Payer: Self-pay | Admitting: Psychiatry

## 2020-08-08 DIAGNOSIS — F3342 Major depressive disorder, recurrent, in full remission: Secondary | ICD-10-CM

## 2020-08-09 NOTE — Chronic Care Management (AMB) (Signed)
  Chronic Care Management   Outreach Note  08/09/2020 Name: Ray Robles MRN: [id number] DOB: [birth year]  Ray Robles is a 59 y.o. year old male who is a primary care patient of [name], Kirstie Peri, MD. I reached out to [name] by phone today in response to a referral sent by Ray Robles health plan.     A second unsuccessful telephone outreach was attempted today. The patient was referred to the case management team for assistance with care management and care coordination.   Follow Up Plan: A HIPPA compliant phone message was left for the patient providing contact information and requesting a return call. The care management team will reach out to the patient again over the next 7 days. If patient returns call to provider office, please advise to call [place] at [phone].  [place] Management

## 2020-08-11 ENCOUNTER — Other Ambulatory Visit: Payer: Self-pay | Admitting: Family Medicine

## 2020-08-11 DIAGNOSIS — I10 Essential (primary) hypertension: Secondary | ICD-10-CM

## 2020-08-14 ENCOUNTER — Other Ambulatory Visit: Payer: Self-pay | Admitting: Psychiatry

## 2020-08-14 DIAGNOSIS — F4310 Post-traumatic stress disorder, unspecified: Secondary | ICD-10-CM

## 2020-08-14 DIAGNOSIS — F3342 Major depressive disorder, recurrent, in full remission: Secondary | ICD-10-CM

## 2020-08-15 NOTE — Chronic Care Management (AMB) (Signed)
°  Chronic Care Management   Outreach Note  08/15/2020 Name: Ray Robles MRN: [id number] DOB: 06-17-1961  Ray Robles is a 59 y.o. year old male who is a primary care patient of [name], Kirstie Peri, MD. I reached out to [name] by phone today in response to a referral sent by Mr. Ray Robles's health plan.     Third unsuccessful telephone outreach was attempted today. The patient was referred to the case management team for assistance with care management and care coordination. The patient's primary care provider has been notified of our unsuccessful attempts to make or maintain contact with the patient. The care management team is pleased to engage with this patient at any time in the future should he/she be interested in assistance from the care management team.   Follow Up Plan: A HIPAA compliant phone message was left for the patient providing contact information and requesting a return call. If patient returns call to provider office, please advise to call [place] at [phone].  [place] Management

## 2020-08-18 NOTE — Progress Notes (Deleted)
Established patient visit   Patient: Ray Robles   DOB: 1961-04-18   59 y.o. Male  MRN: [id number] Visit Date: 08/21/2020  Today's healthcare provider: Lelon Huh, MD   No chief complaint on file.  Subjective    HPI   Hypothyroid, follow-up  He was last seen for hypothyroid 3 months ago.  Management since that visit includes ***. He reports {excellent/good/fair/poor:19665} compliance with treatment. He {is/is not:21021397} having side effects. {document side effects if present:1}  Symptoms: {Yes/No:20286} change in energy level {Yes/No:20286} constipation  {Yes/No:20286} diarrhea {Yes/No:20286} heat / cold intolerance  {Yes/No:20286} nervousness {Yes/No:20286} palpitations  {Yes/No:20286} weight changes    ----------------------------------------------------------------------------------------- Prediabetes- 04/28/20 He does admit to drinking several soft drinks every day. Counseled to keep cut this down to 1 or 2.   {Show patient history (optional):23778::" "}   Medications: Outpatient Medications Prior to Visit  Medication Sig  . buprenorphine-naloxone (SUBOXONE) 2-0.5 mg SUBL SL tablet Place 1-2 tablets See admin instructions under the tongue. Take 2 tablets by mouth in the morning, take 1 tablet by mouth midday and take 2 tablets by mouth at bedtime  . diphenoxylate-atropine (LOMOTIL) 2.5-0.025 MG per tablet Take 2 tablets by mouth every 6 (six) hours as needed for diarrhea or loose stools.   . ezetimibe (ZETIA) 10 MG tablet TAKE 1 TABLET BY MOUTH ONCE DAILY  . furosemide (LASIX) 40 MG tablet Take 1 tablet (40 mg total) by mouth 2 (two) times daily. (Patient taking differently: Take 40 mg daily by mouth. )  . lactulose (CHRONULAC) 10 GM/15ML solution TAKE 30 MLS BY MOUTH 3 TIMES DAILY (Patient taking differently: Take 20 gm by mouth daily)  . levothyroxine (SYNTHROID) 88 MCG tablet Take 1 tablet (88 mcg total) by mouth daily.  . Melatonin 10 MG CAPS Take 10  mg at bedtime by mouth.  . metoprolol tartrate (LOPRESSOR) 25 MG tablet TAKE 1 TABLET BY MOUTH TWICE DAILY WITH FOOD  . metroNIDAZOLE (METROCREAM) 0.75 % cream APPLY TOPICALLY AT BEDTIME  . mirtazapine (REMERON) 15 MG tablet Take 1 tablet (15 mg total) by mouth at bedtime.  [name] NARCAN 4 MG/0.1ML LIQD nasal spray kit   . nitroGLYCERIN (NITROSTAT) 0.4 MG SL tablet Place 0.4 mg under the tongue every 5 (five) minutes as needed for chest pain.  [name] nystatin cream (MYCOSTATIN) Apply 1 application 3 (three) times daily as needed topically (for rash).   [name] omeprazole (PRILOSEC) 20 MG capsule TAKE 2 CAPSULES BY MOUTH ONCE DAILY  . OXYGEN Inhale into the lungs.  . prazosin (MINIPRESS) 5 MG capsule TAKE 1 CAPSULE BY MOUTH AT BEDTIME  . QUEtiapine (SEROQUEL) 300 MG tablet TAKE 1 TABLET BY MOUTH AT BEDTIME  . spironolactone (ALDACTONE) 50 MG tablet Take 1 tablet (50 mg total) by mouth daily. (Patient taking differently: Take 50 mg by mouth 2 (two) times daily. )  . tamsulosin (FLOMAX) 0.4 MG CAPS capsule TAKE 1 CAPSULE BY MOUTH ONCE EVERY EVENING  . traZODone (DESYREL) 50 MG tablet Take 1 to 2 tablets at bedtime as needed for sleep  . triamcinolone cream (KENALOG) 0.1 % APPLY TOPICALLY TWICE DAILY AS NEEDED  . venlafaxine XR (EFFEXOR-XR) 150 MG 24 hr capsule TAKE 1 CAPSULE BY MOUTH ONCE DAILY  . venlafaxine XR (EFFEXOR-XR) 75 MG 24 hr capsule TAKE 1 CAPSULE BY MOUTH ONCE DAILY WITH BREAKFAST  . Vitamin D, Ergocalciferol, (DRISDOL) 1.25 MG (50000 UNIT) CAPS capsule Take 50,000 Units by mouth every 7 (seven) days.  [name] XIFAXAN 550 MG  TABS tablet    Facility-Administered Medications Prior to Visit  Medication Dose Route Frequency Provider  . ipratropium-albuterol (DUONEB) 0.5-2.5 (3) MG/3ML nebulizer solution 3 mL  3 mL Nebulization Once Chrismon, Vickki Muff, PA    Review of Systems  {Heme  Chem  Endocrine  Serology  Results Review (optional):23779::" "}  Objective    There were no vitals taken for this  visit. {Show previous vital signs (optional):23777::" "}  Physical Exam  ***  No results found for any visits on 08/21/20.  Assessment & Plan     ***  No follow-ups on file.      {provider attestation***:1}   Lelon Huh, MD  [facility] [phone] (phone) [phone] (fax)  [place]

## 2020-08-21 ENCOUNTER — Ambulatory Visit (INDEPENDENT_AMBULATORY_CARE_PROVIDER_SITE_OTHER): Payer: Medicare Other | Admitting: Family Medicine

## 2020-08-21 ENCOUNTER — Ambulatory Visit: Payer: Medicare Other | Admitting: Family Medicine

## 2020-08-21 ENCOUNTER — Encounter: Payer: Self-pay | Admitting: Family Medicine

## 2020-08-21 VITALS — BP 221/113 | HR 128

## 2020-08-21 DIAGNOSIS — Z20822 Contact with and (suspected) exposure to covid-19: Secondary | ICD-10-CM | POA: Diagnosis not present

## 2020-08-21 DIAGNOSIS — I10 Essential (primary) hypertension: Secondary | ICD-10-CM

## 2020-08-21 DIAGNOSIS — R7303 Prediabetes: Secondary | ICD-10-CM | POA: Diagnosis not present

## 2020-08-21 DIAGNOSIS — Z8616 Personal history of COVID-19: Secondary | ICD-10-CM

## 2020-08-21 DIAGNOSIS — R6889 Other general symptoms and signs: Secondary | ICD-10-CM

## 2020-08-21 DIAGNOSIS — E785 Hyperlipidemia, unspecified: Secondary | ICD-10-CM | POA: Diagnosis not present

## 2020-08-21 NOTE — Progress Notes (Signed)
Ray Robles,acting as a scribe for Ray Huh, MD.,have documented all relevant documentation on the behalf of Ray Huh, MD,as directed by  Ray Huh, MD while in the presence of Ray Huh, MD.  Virtual telephone visit    Virtual Visit via Telephone Note   This visit type was conducted due to national recommendations for restrictions regarding the COVID-19 Pandemic (e.g. social distancing) in an effort to limit this patient's exposure and mitigate transmission in our community. Due to his co-morbid illnesses, this patient is at least at moderate risk for complications without adequate follow up. This format is felt to be most appropriate for this patient at this time. The patient did not have access to video technology or had technical difficulties with video requiring transitioning to audio format only (telephone). Physical exam was limited to content and character of the telephone converstion.    Patient location: home Provider location: [facility]  I discussed the limitations of evaluation and management by telemedicine and the availability of in person appointments. The patient expressed understanding and agreed to proceed.   Visit Date: 08/21/2020  Today's healthcare provider: Lelon Huh, MD   Chief Complaint  Patient presents with  . Hypertension  . Hypothyroidism  . Prediabetes   Subjective    HPI  He states he was exposed to Covid last week and started having shortness of breath, cough, myalgias, chills a few days after exposure.  He states he had Covid last summer.   He is going for Covid test later this afternoon.   Hypertension, follow-up  BP Readings from Last 3 Encounters:  08/21/20 (!) 221/113  06/26/20 (!) 134/80  04/28/20 120/64   Wt Readings from Last 3 Encounters:  06/26/20 (!) 225 lb (102.1 kg)  04/28/20 223 lb (101.2 kg)  12/31/19 229 lb (103.9 kg)     He was last seen for hypertension 3 months ago.  BP at that visit was  120/64. Management since that visit includes no changes.  He reports excellent compliance with treatment. He is not having side effects.  He is following a Regular diet. He is not exercising. He does not smoke.  Use of agents associated with hypertension: none.   Outside blood pressures are being checked.  --------------------------------------------------------------------------------------------------- Hypothyroid, follow-up  Lab Results  Component Value Date   TSH 5.030 (H) 06/26/2020   TSH 5.420 (H) 12/31/2019   TSH 2.710 10/15/2018   FREET4 0.94 06/26/2020   T4TOTAL 7.2 06/20/2017   T4TOTAL 4.7 05/16/2017   Wt Readings from Last 3 Encounters:  06/26/20 (!) 225 lb (102.1 kg)  04/28/20 223 lb (101.2 kg)  12/31/19 229 lb (103.9 kg)    He was last seen for hypothyroid 3 months ago.  Management since that visit includes Likely contributing to fatigue and maybe to depression. Will increase Levothyroxine to 75 mcg. He reports excellent compliance with treatment. He is not having side effects.   -----------------------------------------------------------------------------------------   1. Prediabetes- 04/28/20 He does admit to drinking several soft drinks every day. Counseled to keep cut this down to 1 or 2.     Medications: Outpatient Medications Prior to Visit  Medication Sig  . buprenorphine-naloxone (SUBOXONE) 2-0.5 mg SUBL SL tablet Place 1-2 tablets See admin instructions under the tongue. Take 2 tablets by mouth in the morning, take 1 tablet by mouth midday and take 2 tablets by mouth at bedtime  . diphenoxylate-atropine (LOMOTIL) 2.5-0.025 MG per tablet Take 2 tablets by mouth every 6 (six) hours as needed for  diarrhea or loose stools.   . ezetimibe (ZETIA) 10 MG tablet TAKE 1 TABLET BY MOUTH ONCE DAILY  . furosemide (LASIX) 40 MG tablet Take 1 tablet (40 mg total) by mouth 2 (two) times daily. (Patient taking differently: Take 40 mg daily by mouth. )  . lactulose  (CHRONULAC) 10 GM/15ML solution TAKE 30 MLS BY MOUTH 3 TIMES DAILY (Patient taking differently: Take 20 gm by mouth daily)  . levothyroxine (SYNTHROID) 88 MCG tablet Take 1 tablet (88 mcg total) by mouth daily.  . Melatonin 10 MG CAPS Take 10 mg at bedtime by mouth.  . metoprolol tartrate (LOPRESSOR) 25 MG tablet TAKE 1 TABLET BY MOUTH TWICE DAILY WITH FOOD  . metroNIDAZOLE (METROCREAM) 0.75 % cream APPLY TOPICALLY AT BEDTIME  . mirtazapine (REMERON) 15 MG tablet Take 1 tablet (15 mg total) by mouth at bedtime.  [name] NARCAN 4 MG/0.1ML LIQD nasal spray kit   . nitroGLYCERIN (NITROSTAT) 0.4 MG SL tablet Place 0.4 mg under the tongue every 5 (five) minutes as needed for chest pain.  [name] nystatin cream (MYCOSTATIN) Apply 1 application 3 (three) times daily as needed topically (for rash).   [name] omeprazole (PRILOSEC) 20 MG capsule TAKE 2 CAPSULES BY MOUTH ONCE DAILY  . OXYGEN Inhale into the lungs.  . prazosin (MINIPRESS) 5 MG capsule TAKE 1 CAPSULE BY MOUTH AT BEDTIME  . QUEtiapine (SEROQUEL) 300 MG tablet TAKE 1 TABLET BY MOUTH AT BEDTIME  . spironolactone (ALDACTONE) 50 MG tablet Take 1 tablet (50 mg total) by mouth daily. (Patient taking differently: Take 50 mg by mouth 2 (two) times daily. )  . tamsulosin (FLOMAX) 0.4 MG CAPS capsule TAKE 1 CAPSULE BY MOUTH ONCE EVERY EVENING  . traZODone (DESYREL) 50 MG tablet Take 1 to 2 tablets at bedtime as needed for sleep  . triamcinolone cream (KENALOG) 0.1 % APPLY TOPICALLY TWICE DAILY AS NEEDED  . venlafaxine XR (EFFEXOR-XR) 150 MG 24 hr capsule TAKE 1 CAPSULE BY MOUTH ONCE DAILY  . venlafaxine XR (EFFEXOR-XR) 75 MG 24 hr capsule TAKE 1 CAPSULE BY MOUTH ONCE DAILY WITH BREAKFAST  . Vitamin D, Ergocalciferol, (DRISDOL) 1.25 MG (50000 UNIT) CAPS capsule Take 50,000 Units by mouth every 7 (seven) days.  [name] XIFAXAN 550 MG TABS tablet    Facility-Administered Medications Prior to Visit  Medication Dose Route Frequency Provider  . ipratropium-albuterol (DUONEB)  0.5-2.5 (3) MG/3ML nebulizer solution 3 mL  3 mL Nebulization Once Chrismon, Dennis E, PA       Objective    BP (!) 221/113 (BP Location: Left Arm)   Pulse (!) 128   SpO2 99%  (wrist BP monitor 160/100 with brachial cuff.) Oxygen = 98% by patient report.       Assessment & Plan     1. Flu-like symptoms   2. Close exposure to COVID-19 virus He is going to be tested later today. Advised to call with results and counseled on benefits of MAB  Treatments.   3. Prediabetes   4. Hyperlipidemia, unspecified hyperlipidemia type   5. Essential hypertension   Will reschedule follow up after covid testing/treatment.       I discussed the assessment and treatment plan with the patient. The patient was provided an opportunity to ask questions and all were answered. The patient agreed with the plan and demonstrated an understanding of the instructions.   The patient was advised to call back or seek an in-person evaluation if the symptoms worsen or if the condition fails to improve as  anticipated.  I provided 8 minutes of non-face-to-face time during this encounter.  The entirety of the information documented in the History of Present Illness, Review of Systems and Physical Exam were personally obtained by me. Portions of this information were initially documented by the CMA and reviewed by me for thoroughness and accuracy.     Ray Huh, MD [facility] [phone] (phone) [phone] (fax)  [place]

## 2020-09-13 ENCOUNTER — Other Ambulatory Visit: Payer: Medicare Other

## 2020-09-13 DIAGNOSIS — Z20822 Contact with and (suspected) exposure to covid-19: Secondary | ICD-10-CM

## 2020-09-14 LAB — SARS-COV-2, NAA 2 DAY TAT

## 2020-09-14 LAB — NOVEL CORONAVIRUS, NAA: SARS-CoV-2, NAA: NOT DETECTED

## 2020-09-21 ENCOUNTER — Telehealth: Payer: Self-pay | Admitting: Family Medicine

## 2020-09-28 NOTE — Telephone Encounter (Signed)
Please advise patient is time to schedule follow up for lipids and pre-diabetes. He had an appt scheduled in September but he wasn't able to come in because he was sick. Was supposed to be rescheduled for this month

## 2020-10-10 ENCOUNTER — Ambulatory Visit: Payer: Medicare Other | Admitting: Family Medicine

## 2020-10-17 ENCOUNTER — Other Ambulatory Visit: Payer: Self-pay | Admitting: Psychiatry

## 2020-10-17 DIAGNOSIS — F3342 Major depressive disorder, recurrent, in full remission: Secondary | ICD-10-CM

## 2020-10-17 DIAGNOSIS — F4310 Post-traumatic stress disorder, unspecified: Secondary | ICD-10-CM

## 2020-10-27 ENCOUNTER — Other Ambulatory Visit: Payer: Self-pay

## 2020-10-27 ENCOUNTER — Emergency Department
Admission: EM | Admit: 2020-10-27 | Discharge: 2020-10-28 | Disposition: A | Discharge status: Home / Self Care | Payer: Medicare Other | Attending: Emergency Medicine | Admitting: Emergency Medicine

## 2020-10-27 ENCOUNTER — Emergency Department: Payer: Medicare Other

## 2020-10-27 DIAGNOSIS — Z79899 Other long term (current) drug therapy: Secondary | ICD-10-CM | POA: Diagnosis not present

## 2020-10-27 DIAGNOSIS — I10 Essential (primary) hypertension: Secondary | ICD-10-CM | POA: Diagnosis not present

## 2020-10-27 DIAGNOSIS — E039 Hypothyroidism, unspecified: Secondary | ICD-10-CM | POA: Insufficient documentation

## 2020-10-27 DIAGNOSIS — Z20822 Contact with and (suspected) exposure to covid-19: Secondary | ICD-10-CM | POA: Diagnosis not present

## 2020-10-27 DIAGNOSIS — J40 Bronchitis, not specified as acute or chronic: Secondary | ICD-10-CM | POA: Diagnosis not present

## 2020-10-27 DIAGNOSIS — R0602 Shortness of breath: Secondary | ICD-10-CM | POA: Diagnosis present

## 2020-10-27 LAB — CBC
HCT: 37.6 % — ABNORMAL LOW (ref 39.0–52.0)
Hemoglobin: 12.4 g/dL — ABNORMAL LOW (ref 13.0–17.0)
MCH: 28.9 pg (ref 26.0–34.0)
MCHC: 33 g/dL (ref 30.0–36.0)
MCV: 87.6 fL (ref 80.0–100.0)
Platelets: 145 10*3/uL — ABNORMAL LOW (ref 150–400)
RBC: 4.29 MIL/uL (ref 4.22–5.81)
RDW: 12.9 % (ref 11.5–15.5)
WBC: 4.1 10*3/uL (ref 4.0–10.5)
nRBC: 0 % (ref 0.0–0.2)

## 2020-10-27 LAB — BASIC METABOLIC PANEL
Anion gap: 10 (ref 5–15)
BUN: 10 mg/dL (ref 6–20)
CO2: 26 mmol/L (ref 22–32)
Calcium: 9.4 mg/dL (ref 8.9–10.3)
Chloride: 101 mmol/L (ref 98–111)
Creatinine, Ser: 0.94 mg/dL (ref 0.61–1.24)
GFR, Estimated: >60 mL/min (ref >60)
Glucose, Bld: 177 mg/dL — ABNORMAL HIGH (ref 70–99)
Potassium: 4 mmol/L (ref 3.5–5.1)
Sodium: 137 mmol/L (ref 135–145)

## 2020-10-27 LAB — TROPONIN I (HIGH SENSITIVITY)
Troponin I (High Sensitivity): 4 ng/L (ref <18)
Troponin I (High Sensitivity): 4 ng/L (ref <18)

## 2020-10-27 MED ORDER — IPRATROPIUM-ALBUTEROL 0.5-2.5 (3) MG/3ML IN SOLN
3.0000 mL | Freq: Once | RESPIRATORY_TRACT | Status: AC
  Administered 2020-10-28: 3 mL via RESPIRATORY_TRACT
  Filled 2020-10-27: qty 6

## 2020-10-27 MED ORDER — IPRATROPIUM-ALBUTEROL 0.5-2.5 (3) MG/3ML IN SOLN
3.0000 mL | Freq: Once | RESPIRATORY_TRACT | Status: AC
  Administered 2020-10-27: 3 mL via RESPIRATORY_TRACT

## 2020-10-27 MED ORDER — AMOXICILLIN-POT CLAVULANATE 875-125 MG PO TABS
1.0000 | ORAL_TABLET | Freq: Once | ORAL | Status: AC
  Administered 2020-10-27: 1 via ORAL
  Filled 2020-10-27: qty 1

## 2020-10-27 MED ORDER — PREDNISONE 20 MG PO TABS
60.0000 mg | ORAL_TABLET | Freq: Once | ORAL | Status: AC
  Administered 2020-10-27: 60 mg via ORAL
  Filled 2020-10-27: qty 3

## 2020-10-27 NOTE — ED Provider Notes (Signed)
[facility] Emergency Department Provider Note  ____________________________________________  Time seen: Approximately 11:40 PM  I have reviewed the triage vital signs and the nursing notes.   HISTORY  Chief Complaint Shortness of Breath and Fever   HPI Ray Robles is a 59 y.o. male with a history of cirrhosis of the liver, hypertension, pancreatitis, chronic pain syndrome who presents for evaluation of shortness of breath.  Patient reports that has been feeling sick for 6 days.  Cough productive of green sputum, shortness of breath, chest tightness, chills, subjective fever, fatigue, insomnia.  Symptoms have been getting worse.  Patient reports symptoms are similar to prior episode of pneumonia.  No vomiting or diarrhea.  Patient reports received his flu shot 3 weeks ago.  Has not been vaccinated for Covid.  Did have Covid last year. No history of COPD or smoking but patient does report having a few yearly episodes like this one.  No personal or family history of PE or DVT, no recent travel immobilization, no leg pain or swelling, no hemoptysis or exogenous hormones.  Past Medical History:  Diagnosis Date  . Anxiety   . Arthritis   . Cirrhosis ([place])   . Depression   . Dyspnea    doe  . Dysrhythmia   . GERD (gastroesophageal reflux disease)    food sticks in throat has esophagus stretched  . H/O bacterial pneumonia 11/11/2007   2008 and 2015   . Heart murmur   . Hepatitis C   . Hx of endoscopy    05/12/2014- [facility]. Dr Candace Cruise. Benign appearing stricture. Dilated the examination was otherwise normal.02/13/2012- Beniign appearing intrinsic mild stenosis at GE junction, successfully dialated  . Hypertension   . Hypothyroidism   . Liver disease   . Neuromuscular disorder ([place])    chronic pain peripheral neuropathy  . Pre-diabetes   . PTSD (post-traumatic stress disorder)     Patient Active Problem List   Diagnosis Date Noted  . MDD (major depressive  disorder), recurrent, in full remission ([place]) 10/12/2019  . PTSD (post-traumatic stress disorder) 10/12/2019  . Opioid dependence on maintenance agonist therapy, no symptoms ([place]) 10/12/2019  . Idiopathic acute pancreatitis 05/18/2019  . Personal history of COVID-19 05/12/2019  . Hyperlipidemia 08/06/2018  . Rosacea (suspected) 02/17/2018  . Opioid use disorder, moderate, in sustained remission ([place]) 01/22/2018  . Inguinal hernia recurrent bilateral 09/02/2017  . Edema 06/20/2017  . Hypothyroid 06/06/2017  . Somnolence 02/12/2017  . Sciatica, right side 07/18/2016  . Actinic keratosis 01/29/2016  . Cirrhosis, non-alcoholic ([place]) 76/28/3151  . Abnormal LFTs 05/12/2015  . Ache in joint 05/12/2015  . Arthritis of knee 05/12/2015  . Chronic pain 05/12/2015  . Dysphagia 05/12/2015  . ED (erectile dysfunction) of organic origin 05/12/2015  . Hypokalemia 05/12/2015  . Positive urine drug screen 05/12/2015  . Renal function impairment 05/12/2015  . Hernia, inguinal, right 05/12/2015  . Seborrheic keratosis 05/12/2015  . Hepatic encephalopathy ([place])   . Hypertension   . Sleep apnea   . Anxiety   . Arthritis 03/22/2015  . Migraine with aura 02/09/2010  . Herniated cervical disc 07/07/2007  . Lumbar disc herniation 07/07/2007  . Insomnia 06/10/2007  . Ascites 05/06/2007  . Esophageal reflux 04/01/2005  . Depression, major, recurrent, moderate ([place]) 12/02/1998  . Neuropathy, peripheral, autonomic, idiopathic 12/02/1998  . Hepatitis C 12/02/1988    Past Surgical History:  Procedure Laterality Date  . COLONOSCOPY WITH PROPOFOL N/A 05/12/2015   Procedure: COLONOSCOPY WITH PROPOFOL;  Surgeon: Hulen Luster, MD;  Location: [facility] ENDOSCOPY;  Service: Gastroenterology;  Laterality: N/A;  . edg    . HERNIA REPAIR  08/2012   Dr. Jamal Collin; [facility]  . INGUINAL HERNIA REPAIR Right 10/28/2017   Procedure: HERNIA REPAIR INGUINAL ADULT;  Surgeon: Leonie Green, MD;  Location: [facility] ORS;  Service:  General;  Laterality: Right;    Prior to Admission medications   Medication Sig Start Date End Date Taking? Authorizing Provider  albuterol (VENTOLIN HFA) 108 (90 Base) MCG/ACT inhaler Inhale 2 puffs into the lungs every 6 (six) hours as needed for wheezing or shortness of breath. 10/28/20   Rudene Re, MD  amoxicillin-clavulanate (AUGMENTIN) 875-125 MG tablet Take 1 tablet by mouth 2 (two) times daily for 7 days. 10/28/20 11/04/20  Rudene Re, MD  buprenorphine-naloxone (SUBOXONE) 2-0.5 mg SUBL SL tablet Place 1-2 tablets See admin instructions under the tongue. Take 2 tablets by mouth in the morning, take 1 tablet by mouth midday and take 2 tablets by mouth at bedtime 04/14/17   [provider]  diphenoxylate-atropine (LOMOTIL) 2.5-0.025 MG per tablet Take 2 tablets by mouth every 6 (six) hours as needed for diarrhea or loose stools.  02/24/15   [provider]  ezetimibe (ZETIA) 10 MG tablet TAKE 1 TABLET BY MOUTH ONCE DAILY 05/22/20   Birdie Sons, MD  furosemide (LASIX) 40 MG tablet Take 1 tablet (40 mg total) by mouth 2 (two) times daily. Patient taking differently: Take 40 mg daily by mouth.  06/20/17   Birdie Sons, MD  lactulose (CHRONULAC) 10 GM/15ML solution TAKE 30 MLS BY MOUTH 3 TIMES DAILY Patient taking differently: Take 20 gm by mouth daily 09/09/17   Birdie Sons, MD  levothyroxine (SYNTHROID) 88 MCG tablet Take 1 tablet (88 mcg total) by mouth daily. 07/04/20   Birdie Sons, MD  Melatonin 10 MG CAPS Take 10 mg at bedtime by mouth.    [provider]  metoprolol tartrate (LOPRESSOR) 25 MG tablet TAKE 1 TABLET BY MOUTH TWICE DAILY WITH FOOD 08/11/20   Birdie Sons, MD  metroNIDAZOLE (METROCREAM) 0.75 % cream APPLY TOPICALLY AT BEDTIME 06/09/18   Birdie Sons, MD  mirtazapine (REMERON) 15 MG tablet Take 1 tablet (15 mg total) by mouth at bedtime. 01/27/20   [place] Crane, MD  [facility] 4 MG/0.1ML LIQD nasal spray kit  10/14/18    [provider]  nitroGLYCERIN (NITROSTAT) 0.4 MG SL tablet Place 0.4 mg under the tongue every 5 (five) minutes as needed for chest pain.    [provider]  nystatin cream (MYCOSTATIN) Apply 1 application 3 (three) times daily as needed topically (for rash).     [provider]  omeprazole (PRILOSEC) 20 MG capsule TAKE 2 CAPSULES BY MOUTH ONCE DAILY 12/13/19   Birdie Sons, MD  OXYGEN Inhale into the lungs.    [provider]  prazosin (MINIPRESS) 5 MG capsule TAKE 1 CAPSULE BY MOUTH AT BEDTIME 08/14/20   [place] Crane, MD  predniSONE (DELTASONE) 20 MG tablet Take 3 tablets (60 mg total) by mouth daily for 4 days. 10/28/20 11/01/20  Rudene Re, MD  QUEtiapine (SEROQUEL) 300 MG tablet TAKE 1 TABLET BY MOUTH AT BEDTIME 08/14/20   [place] Crane, MD  spironolactone (ALDACTONE) 50 MG tablet Take 1 tablet (50 mg total) by mouth daily. Patient taking differently: Take 50 mg by mouth 2 (two) times daily.  02/12/17   Nicholes Mango, MD  tamsulosin (FLOMAX) 0.4 MG CAPS  capsule TAKE 1 CAPSULE BY MOUTH ONCE EVERY EVENING 01/31/20   Birdie Sons, MD  traZODone (DESYREL) 50 MG tablet Take 1 to 2 tablets at bedtime as needed for sleep 04/20/20   [place] Crane, MD  triamcinolone cream (KENALOG) 0.1 % APPLY TOPICALLY TWICE DAILY AS NEEDED 11/07/18   Birdie Sons, MD  venlafaxine XR (EFFEXOR-XR) 150 MG 24 hr capsule TAKE 1 CAPSULE BY MOUTH ONCE DAILY 07/25/20   [place] Crane, MD  venlafaxine XR (EFFEXOR-XR) 75 MG 24 hr capsule TAKE 1 CAPSULE BY MOUTH ONCE DAILY WITH BREAKFAST 07/25/20   [place] Crane, MD  Vitamin D, Ergocalciferol, (DRISDOL) 1.25 MG (50000 UNIT) CAPS capsule Take 50,000 Units by mouth every 7 (seven) days.    [provider]  Doreene Nest 550 MG TABS tablet  10/08/18   [provider]    Allergies Ambien [zolpidem tartrate], Ibuprofen, Morphine, Morphine and related, Tylenol [acetaminophen], Xanax [alprazolam], Zaleplon, and  Zolpidem  Family History  Problem Relation Age of Onset  . Cancer Mother   . Cancer Father   . Alcohol abuse Brother   . Alcohol abuse Brother   . Drug abuse Brother     Social History Social History   Tobacco Use  . Smoking status: Never Smoker  . Smokeless tobacco: Never Used  Vaping Use  . Vaping Use: Never used  Substance Use Topics  . Alcohol use: No    Alcohol/week: 0.0 standard drinks  . Drug use: Not Currently    Types: Oxycodone    Review of Systems  Constitutional: + subjective fever, chills Eyes: Negative for visual changes. ENT: Negative for sore throat. Neck: No neck pain  Cardiovascular: Negative for chest pain. + chest tightness Respiratory: + shortness of breath productive cough Gastrointestinal: Negative for abdominal pain, vomiting or diarrhea. Genitourinary: Negative for dysuria. Musculoskeletal: Negative for back pain. Skin: Negative for rash. Neurological: Negative for headaches, weakness or numbness. Psych: No SI or HI  ____________________________________________   PHYSICAL EXAM:  VITAL SIGNS: ED Triage Vitals  Enc Vitals Group     BP 10/27/20 1809 (!) 165/131     Pulse Rate 10/27/20 1809 (!) 106     Resp 10/27/20 1809 19     Temp 10/27/20 1809 98.2 F (36.8 C)     Temp Source 10/27/20 2331 Oral     SpO2 10/27/20 1809 97 %     Weight 10/27/20 1805 215 lb (97.5 kg)     Height 10/27/20 1805 5' 10.5" (1.791 m)     Head Circumference --      Peak Flow --      Pain Score 10/27/20 1804 7     Pain Loc --      Pain Edu? --      Excl. in [place]? --     Constitutional: Alert and oriented. Well appearing and in no apparent distress. HEENT:      Head: Normocephalic and atraumatic.         Eyes: Conjunctivae are normal. Sclera is non-icteric.       Mouth/Throat: Mucous membranes are moist.       Neck: Supple with no signs of meningismus. Cardiovascular: Regular rate and rhythm. No murmurs, gallops, or rubs. 2+ symmetrical distal pulses are  present in all extremities. No JVD. Respiratory: Normal respiratory effort. Lungs are clear to auscultation bilaterally. No wheezes, crackles, or rhonchi.  Gastrointestinal: Soft, non tender, and non distended. Musculoskeletal: No edema, cyanosis, or erythema of extremities. Neurologic: Normal speech and language. Face  is symmetric. Moving all extremities. No gross focal neurologic deficits are appreciated. Skin: Skin is warm, dry and intact. No rash noted. Psychiatric: Mood and affect are normal. Speech and behavior are normal.  ____________________________________________   LABS (all labs ordered are listed, but only abnormal results are displayed)  Labs Reviewed  CBC - Abnormal; Notable for the following components:      Result Value   Hemoglobin 12.4 (*)    HCT 37.6 (*)    Platelets 145 (*)    All other components within normal limits  BASIC METABOLIC PANEL - Abnormal; Notable for the following components:   Glucose, Bld 177 (*)    All other components within normal limits  RESP PANEL BY RT-PCR (RSV, FLU A&B, COVID)  RVPGX2  TROPONIN I (HIGH SENSITIVITY)  TROPONIN I (HIGH SENSITIVITY)   ____________________________________________  EKG  ED ECG REPORT I, Rudene Re, the attending physician, personally viewed and interpreted this ECG.  Normal sinus rhythm, rate of 100, normal intervals, normal axis, no ST elevations or depressions.  Unchanged from prior from 2018  ____________________________________________  RADIOLOGY  I have personally reviewed the images performed during this visit and I agree with the Radiologist's read.   Interpretation by Radiologist:  DG Chest 2 View  Result Date: 10/27/2020 CLINICAL DATA:  Shortness of breath and fever EXAM: CHEST - 2 VIEW COMPARISON:  11/17/2018 FINDINGS: Small hiatal hernia. Cardiac and mediastinal margins appear normal. No blunting of the costophrenic angles. Suspected subsegmental atelectasis in the left lower lobe.  The lungs appear otherwise clear. I suspect the left first and second ribs are partially fused. IMPRESSION: 1. Small hiatal hernia. 2. Suspected subsegmental atelectasis in the left lower lobe. Electronically Signed   By: Van Clines M.D.   On: 10/27/2020 18:51     ____________________________________________   PROCEDURES  Procedure(s) performed:yes .1-3 Lead EKG Interpretation Performed by: Rudene Re, MD Authorized by: Rudene Re, MD     Interpretation: non-specific     ECG rate assessment: normal     Rhythm: sinus rhythm     Ectopy: none     Critical Care performed:  None ____________________________________________   INITIAL IMPRESSION / ASSESSMENT AND PLAN / ED COURSE  59 y.o. male with a history of cirrhosis of the liver, hypertension, pancreatitis, chronic pain syndrome who presents for evaluation of shortness of breath, productive cough, chest tightness, subjective fever, malaise x 6 days.  Patient is in no obvious distress with normal work of breathing, normal sats, lungs are clear to auscultation with no wheezing, looks euvolemic, no asymmetric leg swelling.  Differential diagnoses including bronchitis versus pneumonia versus Covid versus flu versus viral illness.  CXR visualized by me with no infiltrate, confirmed by radiology.  No fever here.  Patient reports that the highest temperature he got at home was 98, no leukocytosis.  No significant electrolyte derangements, EKG within normal limits.  2 high-sensitivity troponin negative.  Will treat as bronchitis with duo nebs, prednisone and Augmentin.  Will swab for Covid, flu and RSV.  Old medical records reviewed showing the patient does have several similar episodes throughout the year that are treated as bronchitis.  Patient placed on telemetry for close monitoring.  _________________________ 1:12 AM on 10/28/2020 -----------------------------------------  Patient feels markedly improved after duo  nebs, prednisone and Augmentin.  Remains with normal work of breathing and normal sats both at rest and with ambulation.  Covid, flu and RSV negative.  Patient's diagnosis consistent with bronchitis.  Will discharge home with albuterol,  prednisone and Augmentin.  Recommended follow-up with PCP and discussed my standard return precautions.    _____________________________________________ Please note:  Patient was evaluated in Emergency Department today for the symptoms described in the history of present illness. Patient was evaluated in the context of the global COVID-19 pandemic, which necessitated consideration that the patient might be at risk for infection with the SARS-CoV-2 virus that causes COVID-19. Institutional protocols and algorithms that pertain to the evaluation of patients at risk for COVID-19 are in a state of rapid change based on information released by regulatory bodies including the CDC and federal and state organizations. These policies and algorithms were followed during the patient's care in the ED.  Some ED evaluations and interventions may be delayed as a result of limited staffing during the pandemic.   [place] Controlled Substance Database was reviewed by me. ____________________________________________   FINAL CLINICAL IMPRESSION(S) / ED DIAGNOSES   Final diagnoses:  Bronchitis      NEW MEDICATIONS STARTED DURING THIS VISIT:  ED Discharge Orders         Ordered    predniSONE (DELTASONE) 20 MG tablet  Daily        10/28/20 0113    amoxicillin-clavulanate (AUGMENTIN) 875-125 MG tablet  2 times daily        10/28/20 0113    albuterol (VENTOLIN HFA) 108 (90 Base) MCG/ACT inhaler  Every 6 hours PRN        10/28/20 0113           Note:  This document was prepared using Dragon voice recognition software and may include unintentional dictation errors.    Alfred Levins, [place], MD 10/28/20 [phone]

## 2020-10-27 NOTE — ED Triage Notes (Addendum)
Pt comes via POV from home with c/o SOB and fever. Pt states this started on Saturday. Pt states insomnia, dizziness and aches and pains. Pt states green productive cough. Pt states some vomiting. Pt states hx of cirrhosis.  Pt also states hot and cold flashes and some chest pressure.

## 2020-10-28 ENCOUNTER — Other Ambulatory Visit: Payer: Self-pay | Admitting: Psychiatry

## 2020-10-28 DIAGNOSIS — F3342 Major depressive disorder, recurrent, in full remission: Secondary | ICD-10-CM

## 2020-10-28 DIAGNOSIS — J40 Bronchitis, not specified as acute or chronic: Secondary | ICD-10-CM | POA: Diagnosis not present

## 2020-10-28 DIAGNOSIS — F4310 Post-traumatic stress disorder, unspecified: Secondary | ICD-10-CM

## 2020-10-28 LAB — RESP PANEL BY RT-PCR (RSV, FLU A&B, COVID)  RVPGX2
Influenza A by PCR: NEGATIVE
Influenza B by PCR: NEGATIVE
Resp Syncytial Virus by PCR: NEGATIVE
SARS Coronavirus 2 by RT PCR: NEGATIVE

## 2020-10-28 MED ORDER — PREDNISONE 20 MG PO TABS: 60.0000 mg | ORAL_TABLET | Freq: Every day | ORAL | 0 refills | Status: AC

## 2020-10-28 MED ORDER — AMOXICILLIN-POT CLAVULANATE 875-125 MG PO TABS: 1.0000 | ORAL_TABLET | Freq: Two times a day (BID) | ORAL | 0 refills | Status: AC

## 2020-10-28 MED ORDER — ALBUTEROL SULFATE HFA 108 (90 BASE) MCG/ACT IN AERS: 2.0000 | INHALATION_SPRAY | Freq: Four times a day (QID) | RESPIRATORY_TRACT | 2 refills | Status: AC | PRN

## 2020-10-28 NOTE — ED Notes (Signed)
BP taken just after completion of 2 duonebs, will repeat in 30.  Pt reports feeling better after treatments.

## 2020-10-30 ENCOUNTER — Telehealth: Payer: Self-pay

## 2020-10-30 NOTE — Telephone Encounter (Signed)
Patient will need a virtual appt. Left message to call back. OK for PEC to schedule.

## 2020-10-30 NOTE — Telephone Encounter (Signed)
Copied from [place] [phone]. Topic: General - Other >> Oct 30, 2020 12:56 PM Celene Kras wrote: Reason for CRM: Pt called stating that he was admitted into the hospital with bronchitis. He is needing to set up a hosp f/u, but is still having symptoms. Please advise.

## 2020-11-01 NOTE — Telephone Encounter (Signed)
I called and spoke with patient advising him as below. Appointment for virtual video visit scheduled for 11/03/2020 at 1pm with Dr. [name].

## 2020-11-03 ENCOUNTER — Encounter: Payer: Self-pay | Admitting: Family Medicine

## 2020-11-03 NOTE — Progress Notes (Deleted)
MyChart Video Visit    Virtual Visit via Video Note   This visit type was conducted due to national recommendations for restrictions regarding the COVID-19 Pandemic (e.g. social distancing) in an effort to limit this patient's exposure and mitigate transmission in our community. This patient is at least at moderate risk for complications without adequate follow up. This format is felt to be most appropriate for this patient at this time. Physical exam was limited by quality of the video and audio technology used for the visit.   Patient location: *** Provider location: ***  I discussed the limitations of evaluation and management by telemedicine and the availability of in person appointments. The patient expressed understanding and agreed to proceed.  Patient: Ray Robles   DOB: September 25, 1961   59 y.o. Male  MRN: [id number] Visit Date: 11/03/2020  Today's healthcare provider: Lelon Huh, MD   Chief Complaint  Patient presents with  . Follow-up   Subjective    HPI  Follow up ER visit  Patient was seen in ER for shortness of breath and fever on 10/27/2020. He was treated for bronchitis. He had negative Covid, flu and RSV tests.  Treatment for this included discharge home with albuterol, prednisone and Augmentin.  Recommended follow-up with PCP . He reports good compliance with treatment. He reports this condition is Improved. Home O2 is now running 98%.  -----------------------------------------------------------------------------------------   {Show patient history (optional):23778::" "}  Medications: Outpatient Medications Prior to Visit  Medication Sig  . albuterol (VENTOLIN HFA) 108 (90 Base) MCG/ACT inhaler Inhale 2 puffs into the lungs every 6 (six) hours as needed for wheezing or shortness of breath.  [name] amoxicillin-clavulanate (AUGMENTIN) 875-125 MG tablet Take 1 tablet by mouth 2 (two) times daily for 7 days.  . buprenorphine-naloxone (SUBOXONE) 2-0.5 mg SUBL  SL tablet Place 1-2 tablets See admin instructions under the tongue. Take 2 tablets by mouth in the morning, take 1 tablet by mouth midday and take 2 tablets by mouth at bedtime  . diphenoxylate-atropine (LOMOTIL) 2.5-0.025 MG per tablet Take 2 tablets by mouth every 6 (six) hours as needed for diarrhea or loose stools.   . ezetimibe (ZETIA) 10 MG tablet TAKE 1 TABLET BY MOUTH ONCE DAILY  . furosemide (LASIX) 40 MG tablet Take 1 tablet (40 mg total) by mouth 2 (two) times daily. (Patient taking differently: Take 40 mg daily by mouth. )  . lactulose (CHRONULAC) 10 GM/15ML solution TAKE 30 MLS BY MOUTH 3 TIMES DAILY (Patient taking differently: Take 20 gm by mouth daily)  . levothyroxine (SYNTHROID) 88 MCG tablet Take 1 tablet (88 mcg total) by mouth daily.  . Melatonin 10 MG CAPS Take 10 mg at bedtime by mouth.  . metoprolol tartrate (LOPRESSOR) 25 MG tablet TAKE 1 TABLET BY MOUTH TWICE DAILY WITH FOOD  . metroNIDAZOLE (METROCREAM) 0.75 % cream APPLY TOPICALLY AT BEDTIME  . mirtazapine (REMERON) 15 MG tablet Take 1 tablet (15 mg total) by mouth at bedtime.  [name] NARCAN 4 MG/0.1ML LIQD nasal spray kit   . nitroGLYCERIN (NITROSTAT) 0.4 MG SL tablet Place 0.4 mg under the tongue every 5 (five) minutes as needed for chest pain.  [name] nystatin cream (MYCOSTATIN) Apply 1 application 3 (three) times daily as needed topically (for rash).   [name] omeprazole (PRILOSEC) 20 MG capsule TAKE 2 CAPSULES BY MOUTH ONCE DAILY  . OXYGEN Inhale into the lungs.  . prazosin (MINIPRESS) 5 MG capsule TAKE 1 CAPSULE BY MOUTH AT BEDTIME  . QUEtiapine (  SEROQUEL) 300 MG tablet TAKE 1 TABLET BY MOUTH AT BEDTIME  . spironolactone (ALDACTONE) 50 MG tablet Take 1 tablet (50 mg total) by mouth daily. (Patient taking differently: Take 50 mg by mouth 2 (two) times daily. )  . tamsulosin (FLOMAX) 0.4 MG CAPS capsule TAKE 1 CAPSULE BY MOUTH ONCE EVERY EVENING  . traZODone (DESYREL) 50 MG tablet Take 1 to 2 tablets at bedtime as needed for sleep   . triamcinolone cream (KENALOG) 0.1 % APPLY TOPICALLY TWICE DAILY AS NEEDED  . venlafaxine XR (EFFEXOR-XR) 150 MG 24 hr capsule TAKE 1 CAPSULE BY MOUTH ONCE DAILY  . venlafaxine XR (EFFEXOR-XR) 75 MG 24 hr capsule TAKE 1 CAPSULE BY MOUTH ONCE DAILY WITH BREAKFAST  . Vitamin D, Ergocalciferol, (DRISDOL) 1.25 MG (50000 UNIT) CAPS capsule Take 50,000 Units by mouth every 7 (seven) days.  [name] XIFAXAN 550 MG TABS tablet    Facility-Administered Medications Prior to Visit  Medication Dose Route Frequency Provider  . ipratropium-albuterol (DUONEB) 0.5-2.5 (3) MG/3ML nebulizer solution 3 mL  3 mL Nebulization Once Chrismon, Vickki Muff, PA    Review of Systems  Constitutional: Positive for fatigue. Negative for appetite change, chills, diaphoresis and fever.  HENT: Positive for congestion and rhinorrhea. Negative for ear pain, postnasal drip, sinus pressure, sinus pain, sneezing and sore throat.   Respiratory: Positive for cough (productive with green mucus), shortness of breath and wheezing. Negative for chest tightness.   Cardiovascular: Negative for chest pain and palpitations.  Gastrointestinal: Negative for abdominal pain, nausea and vomiting.  Musculoskeletal: Positive for myalgias.  Neurological: Positive for headaches. Negative for dizziness.    {Heme  Chem  Endocrine  Serology  Results Review (optional):23779::" "}  Objective    There were no vitals taken for this visit. {Show previous vital signs (optional):23777::" "}  Physical Exam     Assessment & Plan     ***  No follow-ups on file.     I discussed the assessment and treatment plan with the patient. The patient was provided an opportunity to ask questions and all were answered. The patient agreed with the plan and demonstrated an understanding of the instructions.   The patient was advised to call back or seek an in-person evaluation if the symptoms worsen or if the condition fails to improve as anticipated.  I  provided *** minutes of non-face-to-face time during this encounter.  {provider attestation***:1}  Lelon Huh, MD [facility] [phone] (phone) [phone] (fax)  [place]

## 2020-11-13 ENCOUNTER — Other Ambulatory Visit: Payer: Self-pay

## 2020-11-13 ENCOUNTER — Encounter: Payer: Self-pay | Admitting: Family Medicine

## 2020-11-13 ENCOUNTER — Ambulatory Visit (INDEPENDENT_AMBULATORY_CARE_PROVIDER_SITE_OTHER): Payer: Medicare Other | Admitting: Family Medicine

## 2020-11-13 VITALS — BP 139/70 | HR 77 | Temp 98.2°F | Resp 16 | Wt 233.8 lb

## 2020-11-13 DIAGNOSIS — E039 Hypothyroidism, unspecified: Secondary | ICD-10-CM

## 2020-11-13 DIAGNOSIS — I10 Essential (primary) hypertension: Secondary | ICD-10-CM | POA: Diagnosis not present

## 2020-11-13 DIAGNOSIS — R739 Hyperglycemia, unspecified: Secondary | ICD-10-CM | POA: Diagnosis not present

## 2020-11-13 DIAGNOSIS — J40 Bronchitis, not specified as acute or chronic: Secondary | ICD-10-CM

## 2020-11-13 NOTE — Patient Instructions (Addendum)
•   Please review the attached list of medications and notify my office if there are any errors.    Please contact your eyecare professional to schedule a routine eye exam   Covid-19 vaccines: The Covid vaccines have been given to hundreds of millions of people and found to be very effective and are as safe as any other vaccine.  The [employer] vaccine has been associated with very rare dangerous blood clots, but only in adult women under the age of 38.  The risk of dying from Covid infections is much higher than having a serious reaction to the vaccine.  I strongly recommend getting fully vaccinated against Covid-19.  I recommend that adult women under 60 get fully vaccinated, but the [place] vaccines may be safer for those women than the [employer] vaccine.

## 2020-11-13 NOTE — Progress Notes (Signed)
Established patient visit   Patient: Ray Robles   DOB: 08-31-1961   59 y.o. Male  MRN: [id number] Visit Date: 11/13/2020  Today's healthcare provider: Lelon Huh, MD   Chief Complaint  Patient presents with  . Follow-up   Subjective    HPI  Follow up for Bronchitis  The patient was last seen for this 2 weeks ago. Changes made at last visit include no changes. Patient reports that he has not had to use his rescue inhaler. He is done with Augmentin and prednisone.   He reports good compliance with treatment. He feels that condition is resolved. He is not having side effects.   Is also due for follow up pre-diabetes and hypertension Lab Results  Component Value Date   HGBA1C 6.6 (A) 04/28/2020   HGBA1C 5.4 01/10/2017    Wt Readings from Last 3 Encounters:  11/13/20 233 lb 12.8 oz (106.1 kg)  10/27/20 215 lb (97.5 kg)  06/26/20 (!) 225 lb (102.1 kg)        Medications: Outpatient Medications Prior to Visit  Medication Sig  . albuterol (VENTOLIN HFA) 108 (90 Base) MCG/ACT inhaler Inhale 2 puffs into the lungs every 6 (six) hours as needed for wheezing or shortness of breath.  . buprenorphine-naloxone (SUBOXONE) 2-0.5 mg SUBL SL tablet Place 1-2 tablets See admin instructions under the tongue. Take 2 tablets by mouth in the morning, take 1 tablet by mouth midday and take 2 tablets by mouth at bedtime  . diphenoxylate-atropine (LOMOTIL) 2.5-0.025 MG tablet Take 2 tablets by mouth every 6 (six) hours as needed for diarrhea or loose stools.   . ezetimibe (ZETIA) 10 MG tablet TAKE 1 TABLET BY MOUTH ONCE DAILY  . furosemide (LASIX) 40 MG tablet Take 1 tablet (40 mg total) by mouth 2 (two) times daily. (Patient taking differently: Take 40 mg by mouth daily.)  . lactulose (CHRONULAC) 10 GM/15ML solution TAKE 30 MLS BY MOUTH 3 TIMES DAILY  . levothyroxine (SYNTHROID) 88 MCG tablet Take 1 tablet (88 mcg total) by mouth daily.  . Melatonin 10 MG CAPS Take 10 mg at  bedtime by mouth.  . metoprolol tartrate (LOPRESSOR) 25 MG tablet TAKE 1 TABLET BY MOUTH TWICE DAILY WITH FOOD  . metroNIDAZOLE (METROCREAM) 0.75 % cream APPLY TOPICALLY AT BEDTIME  . mirtazapine (REMERON) 15 MG tablet Take 1 tablet (15 mg total) by mouth at bedtime.  [name] NARCAN 4 MG/0.1ML LIQD nasal spray kit   . nitroGLYCERIN (NITROSTAT) 0.4 MG SL tablet Place 0.4 mg under the tongue every 5 (five) minutes as needed for chest pain.  [name] nystatin cream (MYCOSTATIN) Apply 1 application 3 (three) times daily as needed topically (for rash).   [name] omeprazole (PRILOSEC) 20 MG capsule TAKE 2 CAPSULES BY MOUTH ONCE DAILY  . OXYGEN Inhale into the lungs.  . prazosin (MINIPRESS) 5 MG capsule TAKE 1 CAPSULE BY MOUTH AT BEDTIME  . QUEtiapine (SEROQUEL) 300 MG tablet TAKE 1 TABLET BY MOUTH AT BEDTIME  . spironolactone (ALDACTONE) 50 MG tablet Take 1 tablet (50 mg total) by mouth daily. (Patient taking differently: Take 50 mg by mouth 2 (two) times daily.)  . tamsulosin (FLOMAX) 0.4 MG CAPS capsule TAKE 1 CAPSULE BY MOUTH ONCE EVERY EVENING  . traZODone (DESYREL) 50 MG tablet Take 1 to 2 tablets at bedtime as needed for sleep  . triamcinolone cream (KENALOG) 0.1 % APPLY TOPICALLY TWICE DAILY AS NEEDED  . venlafaxine XR (EFFEXOR-XR) 150 MG 24 hr capsule TAKE  1 CAPSULE BY MOUTH ONCE DAILY  . venlafaxine XR (EFFEXOR-XR) 75 MG 24 hr capsule TAKE 1 CAPSULE BY MOUTH ONCE DAILY WITH BREAKFAST  . Vitamin D, Ergocalciferol, (DRISDOL) 1.25 MG (50000 UNIT) CAPS capsule Take 50,000 Units by mouth every 7 (seven) days.  [name] XIFAXAN 550 MG TABS tablet    Facility-Administered Medications Prior to Visit  Medication Dose Route Frequency Provider  . ipratropium-albuterol (DUONEB) 0.5-2.5 (3) MG/3ML nebulizer solution 3 mL  3 mL Nebulization Once Chrismon, Vickki Muff, PA-C    Review of Systems  Constitutional: Negative.   HENT: Negative.   Eyes: Negative.   Respiratory: Negative for cough and shortness of breath.    Cardiovascular: Negative for chest pain, palpitations and leg swelling.  Neurological: Negative for dizziness, light-headedness and headaches.      Objective    BP 139/70   Pulse 77   Temp 98.2 F (36.8 C)   Resp 16   Wt 233 lb 12.8 oz (106.1 kg)   BMI 33.07 kg/m    Physical Exam   General: Appearance:    Obese male in no acute distress  Eyes:    PERRL, conjunctiva/corneas clear, EOM's intact       Lungs:     Clear to auscultation bilaterally, respirations unlabored  Heart:    Normal heart rate. Normal rhythm. No murmurs, rubs, or gallops.   MS:   All extremities are intact.   Neurologic:   Awake, alert, oriented x 3. No apparent focal neurological           defect.         Assessment & Plan     1. Bronchitis Resolved.   2. Primary hypertension Well controlled.  Continue current medications.   - CBC - Comprehensive metabolic panel - Lipid panel  3. Hypothyroidism, unspecified type  - TSH  4. Hyperglycemia  - Hemoglobin A1c   He states he got the flu vaccine at the pain clinic in [facility]       The entirety of the information documented in the History of Present Illness, Review of Systems and Physical Exam were personally obtained by me. Portions of this information were initially documented by the CMA and reviewed by me for thoroughness and accuracy.      Lelon Huh, MD  [facility] [phone] (phone) [phone] (fax)  [place]

## 2020-11-14 ENCOUNTER — Encounter: Payer: Self-pay | Admitting: Family Medicine

## 2020-11-14 DIAGNOSIS — E119 Type 2 diabetes mellitus without complications: Secondary | ICD-10-CM | POA: Insufficient documentation

## 2020-11-14 LAB — COMPREHENSIVE METABOLIC PANEL
ALT: 11 IU/L (ref 0–44)
AST: 11 IU/L (ref 0–40)
Albumin/Globulin Ratio: 1.6 (ref 1.2–2.2)
Albumin: 4.1 g/dL (ref 3.8–4.8)
Alkaline Phosphatase: 138 IU/L — ABNORMAL HIGH (ref 44–121)
BUN/Creatinine Ratio: 6 — ABNORMAL LOW (ref 10–24)
BUN: 6 mg/dL — ABNORMAL LOW (ref 8–27)
Bilirubin Total: 0.5 mg/dL (ref 0.0–1.2)
CO2: 25 mmol/L (ref 20–29)
Calcium: 9 mg/dL (ref 8.6–10.2)
Chloride: 104 mmol/L (ref 96–106)
Creatinine, Ser: 0.93 mg/dL (ref 0.76–1.27)
GFR calc Af Amer: 102 mL/min/{1.73_m2} (ref >59)
GFR calc non Af Amer: 88 mL/min/{1.73_m2} (ref >59)
Globulin, Total: 2.5 g/dL (ref 1.5–4.5)
Glucose: 132 mg/dL — ABNORMAL HIGH (ref 65–99)
Potassium: 4.3 mmol/L (ref 3.5–5.2)
Sodium: 143 mmol/L (ref 134–144)
Total Protein: 6.6 g/dL (ref 6.0–8.5)

## 2020-11-14 LAB — LIPID PANEL
Chol/HDL Ratio: 5.1 ratio — ABNORMAL HIGH (ref 0.0–5.0)
Cholesterol, Total: 180 mg/dL (ref 100–199)
HDL: 35 mg/dL — ABNORMAL LOW (ref >39)
LDL Chol Calc (NIH): 119 mg/dL — ABNORMAL HIGH (ref 0–99)
Triglycerides: 144 mg/dL (ref 0–149)
VLDL Cholesterol Cal: 26 mg/dL (ref 5–40)

## 2020-11-14 LAB — CBC
Hematocrit: 36 % — ABNORMAL LOW (ref 37.5–51.0)
Hemoglobin: 12.1 g/dL — ABNORMAL LOW (ref 13.0–17.7)
MCH: 28.7 pg (ref 26.6–33.0)
MCHC: 33.6 g/dL (ref 31.5–35.7)
MCV: 86 fL (ref 79–97)
Platelets: 147 10*3/uL — ABNORMAL LOW (ref 150–450)
RBC: 4.21 x10E6/uL (ref 4.14–5.80)
RDW: 13.3 % (ref 11.6–15.4)
WBC: 3.7 10*3/uL (ref 3.4–10.8)

## 2020-11-14 LAB — HEMOGLOBIN A1C
Est. average glucose Bld gHb Est-mCnc: 154 mg/dL
Hgb A1c MFr Bld: 7 % — ABNORMAL HIGH (ref 4.8–5.6)

## 2020-11-14 LAB — TSH: TSH: 0.801 u[IU]/mL (ref 0.450–4.500)

## 2020-11-15 ENCOUNTER — Other Ambulatory Visit: Payer: Self-pay

## 2020-11-15 ENCOUNTER — Other Ambulatory Visit: Payer: Self-pay | Admitting: Psychiatry

## 2020-11-15 DIAGNOSIS — E119 Type 2 diabetes mellitus without complications: Secondary | ICD-10-CM

## 2020-11-15 DIAGNOSIS — F4310 Post-traumatic stress disorder, unspecified: Secondary | ICD-10-CM

## 2020-11-15 DIAGNOSIS — F3342 Major depressive disorder, recurrent, in full remission: Secondary | ICD-10-CM

## 2020-11-15 DIAGNOSIS — E039 Hypothyroidism, unspecified: Secondary | ICD-10-CM

## 2020-11-15 MED ORDER — LEVOTHYROXINE SODIUM 88 MCG PO TABS: 88.0000 ug | ORAL_TABLET | Freq: Every day | ORAL | 2 refills | Status: DC

## 2020-11-15 MED ORDER — METFORMIN HCL ER 500 MG PO TB24: 500.0000 mg | ORAL_TABLET | Freq: Every day | ORAL | 3 refills | Status: DC

## 2020-11-15 NOTE — Telephone Encounter (Signed)
He has 45mcg dose on medication list, but was last dispensed 75 mcg tablets at [facility] on 09-22-2020 which must have been old prescription. Have sent new prescriptions to [facility], he just needs to make follow up appt.

## 2020-11-15 NOTE — Telephone Encounter (Signed)
Tried calling patient. Left message to call back. OK for [facility] triage to advise of results, send in prescriptions and schedule follow up appointment.   Dr. [name], will you clarify what dose you would like to change Levothyroxine to? Patient's medication list has that he is currently on 41mcg daily.

## 2020-11-15 NOTE — Telephone Encounter (Signed)
-----   Message from Birdie Sons, MD sent at 11/14/2020  7:58 AM EST ----- A1c is 7.0, indicating average blood sugar of 154 which is diabetic.  Needs referral to lifestyle center and needs to start metformin ER 500mg  once a day #30, rf x 3.  Is also hyPOthyroid. Need to increase levothyroxine to 70mcg daily, #90. Rf x 1.  Rest of labs are good. Please schedule follow up in 10-12 weeks for thyroid and diabetes.

## 2020-11-16 NOTE — Telephone Encounter (Signed)
Patient advised and verbalized understanding. Follow up appointment scheduled for 02/16/2021 at 1:40pm. Referral order for Lifestyle center placed. Im not sure of all the answers for this referral order. Will you review the questions.

## 2020-11-26 NOTE — Progress Notes (Signed)
No show

## 2020-12-26 ENCOUNTER — Other Ambulatory Visit: Payer: Self-pay | Admitting: Family Medicine

## 2020-12-26 NOTE — Telephone Encounter (Signed)
Requested medication (s) are due for refill today: expired medication  Requested medication (s) are on the active medication list: yes  Last refill:  12/13/19   #60 12 refills  Future visit scheduled: yes in 1 week  Notes to clinic:  expired medication. Do you want to renew?     Requested Prescriptions  Pending Prescriptions Disp Refills   omeprazole (PRILOSEC) 40 MG capsule [Pharmacy Med Name: OMEPRAZOLE DR 40 MG CAP] 60 capsule     Sig: TAKE 1 CAPSULE BY MOUTH ONCE DAILY      Gastroenterology: Proton Pump Inhibitors Passed - 12/26/2020  6:08 PM      Passed - Valid encounter within last 12 months    Recent Outpatient Visits           1 month ago [place], Donald E, MD   4 months ago Flu-like symptoms   [facility] Birdie Sons, MD   6 months ago Other fatigue   [facility] Birdie Sons, MD   8 months ago Prediabetes   [facility] Birdie Sons, MD   12 months ago Hypothyroidism, unspecified type   [facility] Birdie Sons, MD       Future Appointments             In 1 month Fisher, Kirstie Peri, MD [facility], [place]

## 2020-12-26 NOTE — Telephone Encounter (Signed)
Future visit in 1 month  

## 2021-01-04 ENCOUNTER — Ambulatory Visit: Payer: Self-pay | Admitting: *Deleted

## 2021-01-04 NOTE — Telephone Encounter (Signed)
Pt called in c/o having severe abd pain in upper stomach area like he had a year and a half ago with pancreatitis that landed him in the hospital.  See notes below.     I have referred him to the ED.  He is agreeable to going.  Wife is taking him to [facility] ED now.  I forwarded my notes to [facility] for Dr. Maralyn Sago information.   Reason for Disposition . [1] SEVERE pain (e.g., excruciating) AND [2] present > 1 hour    Pancreatitis symptoms like he had before and was in the hospital.  Answer Assessment - Initial Assessment Questions 1. ONSET:  "When did this first appear?"     I have a inguinal hernia.   I think it might be pancreatitis.    Pain started 3-4 days ago.   A yr and a half ago it flared up and I was in the hospital.  I'm having nausea but no vomiting.   I'm hurting at upper stomach all the way across.  I ate potato soup last night that's all.     2. APPEARANCE: "What does it look like?"     *No Answer* 3. SIZE: "How big is it?" (inches, cm or compare to coins, fruit)     *No Answer* 4. LOCATION: "Where exactly is the hernia located?"     *No Answer* 5. PATTERN: "Does the swelling come and go, or has it been constant since it started?"     *No Answer* 6. PAIN: "Is there any pain?" If Yes, ask: "How bad is it?"  (Scale 1-10; or mild, moderate, severe)     *No Answer* 7. DIAGNOSIS: "Have you been seen by a doctor for this?" "Did the doctor diagnose you as having a hernia?"     *No Answer* 8. OTHER SYMPTOMS: "Do you have any other symptoms?" (e.g., fever, abdominal pain, vomiting)     *No Answer* 9. PREGNANCY: "Is there any chance you are pregnant?" "When was your last menstrual period?"     *No Answer*  Answer Assessment - Initial Assessment Questions 1. LOCATION: "Where does it hurt?"      It hurts in the upper stomach area all the way across.  It started 3-4 days ago.   It really hurts.   It feels like pancreatitis.   I had this happen a year  and a half ago and I ended up in the hospital.  I'm very nauseated but not vomiting.   I did eat some potato soup late night but food really makes the pain worse. 2. RADIATION: "Does the pain shoot anywhere else?" (e.g., chest, back)     No just across the whole upper stomach area. 3. ONSET: "When did the pain begin?" (e.g., minutes, hours or days ago)      3-4 days ago 4. SUDDEN: "Gradual or sudden onset?"     Not asked 5. PATTERN "Does the pain come and go, or is it constant?"    - If constant: "Is it getting better, staying the same, or worsening?"      (Note: Constant means the pain never goes away completely; most serious pain is constant and it progresses)     - If intermittent: "How long does it last?" "Do you have pain now?"     (Note: Intermittent means the pain goes away completely between bouts)     Constant 6. SEVERITY: "How bad is the pain?"  (e.g., Scale 1-10; mild, moderate, or severe)    -  MILD (1-3): doesn't interfere with normal activities, abdomen soft and not tender to touch     - MODERATE (4-7): interferes with normal activities or awakens from sleep, tender to touch     - SEVERE (8-10): excruciating pain, doubled over, unable to do any normal activities       Severe   7. RECURRENT SYMPTOM: "Have you ever had this type of stomach pain before?" If Yes, ask: "When was the last time?" and "What happened that time?"      Yes    A year and a half ago.   It was pancreatitis and I ended up in the hospital.   This feels exactly the same. 8. AGGRAVATING FACTORS: "Does anything seem to cause this pain?" (e.g., foods, stress, alcohol)     Food makes the pain worse. 9. CARDIAC SYMPTOMS: "Do you have any of the following symptoms: chest pain, difficulty breathing, sweating, nausea?"     No 10. OTHER SYMPTOMS: "Do you have any other symptoms?" (e.g., fever, vomiting, diarrhea)       Nausea    No vomiting 11. PREGNANCY: "Is there any chance you are pregnant?" "When was your last  menstrual period?"       N/A  Protocols used: ABDOMINAL PAIN - UPPER-A-AH, HERNIA-A-AH

## 2021-01-09 NOTE — Telephone Encounter (Signed)
Please review refill request and advise. This medication is not listed on his active medication list.

## 2021-01-18 ENCOUNTER — Ambulatory Visit: Payer: Medicare Other | Admitting: *Deleted

## 2021-01-29 ENCOUNTER — Encounter: Payer: Self-pay | Admitting: *Deleted

## 2021-01-29 ENCOUNTER — Other Ambulatory Visit: Payer: Self-pay | Admitting: Psychiatry

## 2021-01-29 ENCOUNTER — Encounter: Payer: Medicare Other | Attending: Family Medicine | Admitting: *Deleted

## 2021-01-29 ENCOUNTER — Other Ambulatory Visit: Payer: Self-pay

## 2021-01-29 VITALS — BP 124/80 | Ht 70.5 in | Wt 219.4 lb

## 2021-01-29 DIAGNOSIS — E119 Type 2 diabetes mellitus without complications: Secondary | ICD-10-CM | POA: Insufficient documentation

## 2021-01-29 DIAGNOSIS — F3342 Major depressive disorder, recurrent, in full remission: Secondary | ICD-10-CM

## 2021-01-29 DIAGNOSIS — F4310 Post-traumatic stress disorder, unspecified: Secondary | ICD-10-CM

## 2021-01-29 NOTE — Progress Notes (Signed)
Diabetes Self-Management Education  Visit Type: First/Initial  Appt. Start Time: 1555 Appt. End Time: 1705  01/29/2021  Ray Robles, identified by name and date of birth, is a 60 y.o. male with a diagnosis of Diabetes: Type 2.   ASSESSMENT  Blood pressure 124/80, height 5' 10.5" (1.791 m), weight 219 lb 6.4 oz (99.5 kg). Body mass index is 31.04 kg/m.   Diabetes Self-Management Education - 01/29/21 1927      Visit Information   Visit Type First/Initial      Initial Visit   Diabetes Type Type 2    Are you currently following a meal plan? No    Are you taking your medications as prescribed? Yes    Date Diagnosed ? 1 year      Health Coping   How would you rate your overall health? Poor      Psychosocial Assessment   Patient Belief/Attitude about Diabetes Other (comment)   "no different"   Self-care barriers None    Self-management support Doctor's office;Family    Other persons present Spouse/SO    Patient Concerns Nutrition/Meal planning;Medication;Monitoring;Healthy Lifestyle;Problem Solving;Glycemic Control;Weight Control    Special Needs None    Preferred Learning Style Auditory;Visual;Hands on    [place]    How often do you need to have someone help you when you read instructions, pamphlets, or other written materials from your doctor or pharmacy? 3 - Sometimes    What is the last grade level you completed in school? Wife completed paperwork - depends on how he feels      Pre-Education Assessment   Patient understands the diabetes disease and treatment process. Needs Instruction    Patient understands incorporating nutritional management into lifestyle. Needs Instruction    Patient undertands incorporating physical activity into lifestyle. Needs Instruction    Patient understands using medications safely. Needs Instruction    Patient understands monitoring blood glucose, interpreting and using results Needs Instruction    Patient  understands prevention, detection, and treatment of acute complications. Needs Instruction    Patient understands prevention, detection, and treatment of chronic complications. Needs Instruction    Patient understands how to develop strategies to address psychosocial issues. Needs Instruction    Patient understands how to develop strategies to promote health/change behavior. Needs Instruction      Complications   Last HgB A1C per patient/outside source 7 %   11/13/2020   How often do you check your blood sugar? 0 times/day (not testing)   Provided Accu-Chek Guide Me meter and instructed on use. BG upon return demonstration was 184 mg/dL at 4:45 pm. Pt drank regular Pepsi during the appointment.   Have you had a dilated eye exam in the past 12 months? Yes    Have you had a dental exam in the past 12 months? No   no teeth - wears dentures   Are you checking your feet? No      Dietary Intake   Breakfast skips - doesn't wake up until 2:00 pm    Lunch chicken or [place] (wife reports he eats in phases - certain foods for a month then he eats something else for a month    Snack (afternoon) 2 snacks/day - cookies, fruit (grapes, tangerines, bananas, strawberries)    Dinner chicken wings from [employer]; beef, pork; potatoes, beans, corn, rice, pasta, green beans, lettuce, tomatoes, greens    Snack (evening) Nabs   Beverage(s) Pepsi - was drinking one during this appointment; sugar sweetened tea and  coffee      Exercise   Exercise Type ADL's      Patient Education   Previous Diabetes Education No    Disease state  Definition of diabetes, type 1 and 2, and the diagnosis of diabetes;Factors that contribute to the development of diabetes    Nutrition management  Role of diet in the treatment of diabetes and the relationship between the three main macronutrients and blood glucose level;Food label reading, portion sizes and measuring food.;Reviewed blood glucose goals for pre and post meals and how to  evaluate the patients' food intake on their blood glucose level.    Physical activity and exercise  Role of exercise on diabetes management, blood pressure control and cardiac health.    Medications Reviewed patients medication for diabetes, action, purpose, timing of dose and side effects.    Monitoring Taught/evaluated SMBG meter.;Purpose and frequency of SMBG.;Taught/discussed recording of test results and interpretation of SMBG.;Identified appropriate SMBG and/or A1C goals.    Chronic complications Relationship between chronic complications and blood glucose control;Identified and discussed with patient  current chronic complications    Psychosocial adjustment Identified and addressed patients feelings and concerns about diabetes      Individualized Goals (developed by patient)   Reducing Risk Other (comment)   improve blood sugars, decrease medications, prevent diabetes complications, lose weight, lead a healthier lifestyle, become more fit     Outcomes   Expected Outcomes Demonstrated limited interest in learning.  Expect minimal changes           Individualized Plan for Diabetes Self-Management Training:   Learning Objective:  Patient will have a greater understanding of diabetes self-management. Patient education plan is to attend individual and/or group sessions per assessed needs and concerns.   Plan:   Patient Instructions  Check blood sugars 1 x day before breakfast or 2 hrs after supper every day Bring blood sugar records to the next appointment/class  Call your doctor for a prescription for:  1. Meter strips (type) Accu-Chek Guide checking  1 times per day  2. Lancets (type) Accu-Chek Softclix checking  1     times per day  Exercise: Walk as tolerated   Eat 3 meals day,  1-2  snacks a day Space meals 4-6 hours apart Limit fried foods Drink more water and avoid sugar sweetened drinks (soda)  Call back if you want to schedule Diabetes classes or an appointment with  the dietitian  Expected Outcomes:  Demonstrated limited interest in learning.  Expect minimal changes  Education material provided:  General Meal Planning Guidelines Simple Meal Plan Meter = Accu-Chek Guide Me  If problems or questions, patient to contact team via:  Johny Drilling, RN, [place], [place] [phone]  Future DSME appointment:  The patient reports he doesn't want to come to Diabetes classes and he wants to think about coming back to see a dietitian. His wife reports that they will call later to schedule an appointment. Will discharge him if no response in 2 weeks.

## 2021-01-29 NOTE — Patient Instructions (Signed)
Check blood sugars 1 x day before breakfast or 2 hrs after supper every day Bring blood sugar records to the next appointment/class  Call your doctor for a prescription for:  1. Meter strips (type) Accu-Chek Guide checking  1 times per day  2. Lancets (type) Accu-Chek Softclix checking  1     times per day  Exercise: Walk as tolerated   Eat 3 meals day,  1-2  snacks a day Space meals 4-6 hours apart Limit fried foods Drink more water and avoid sugar sweetened drinks (soda)  Call back if you want to schedule Diabetes classes or an appointment with the dietitian

## 2021-02-01 ENCOUNTER — Other Ambulatory Visit: Payer: Self-pay | Admitting: Psychiatry

## 2021-02-01 DIAGNOSIS — F3342 Major depressive disorder, recurrent, in full remission: Secondary | ICD-10-CM

## 2021-02-01 DIAGNOSIS — F4310 Post-traumatic stress disorder, unspecified: Secondary | ICD-10-CM

## 2021-02-02 ENCOUNTER — Other Ambulatory Visit: Payer: Self-pay | Admitting: Family Medicine

## 2021-02-02 NOTE — Telephone Encounter (Signed)
Patient was seen by [facility] nutrition for Diabetes on 01/29/2021. He was given a glucose meter starter kit. His wife is calling requesting refills for lancets and test strips. Below is the documented plan from the Lifestyle center. Please advise how often patient should be checking his blood sugars and the qty for pharmacy to dispense.   Plan:   Patient Instructions  Check blood sugars 1 x day before breakfast or 2 hrs after supper every day Bring blood sugar records to the next appointment/class  Call your doctor for a prescription for:             1. Meter strips (type)   Accu-Chek Guide        checking  1      times per day             2. Lancets (type)         Accu-Chek Softclix      checking  1     times per day

## 2021-02-02 NOTE — Telephone Encounter (Signed)
Medication Refill - Medication: Accu-chek simply easy (needs more testing supplies, test strips/lancets/needles etc.)   Has the patient contacted their pharmacy? No. (Agent: If no, request that the patient contact the pharmacy for the refill.) (Agent: If yes, when and what did the pharmacy advise?)  Preferred Pharmacy (with phone number or street name):  [facility] [place], [place] [address]  Phone: [phone] Fax: [phone]     Agent: Please be advised that RX refills may take up to 3 business days. We ask that you follow-up with your pharmacy.

## 2021-02-02 NOTE — Telephone Encounter (Signed)
Patient requesting Accu-chek simply easy testing supplies. Test strips, lancets needles. No order noted for accu-chek . Please advise.

## 2021-02-04 ENCOUNTER — Other Ambulatory Visit: Payer: Self-pay | Admitting: Psychiatry

## 2021-02-04 DIAGNOSIS — F3342 Major depressive disorder, recurrent, in full remission: Secondary | ICD-10-CM

## 2021-02-04 DIAGNOSIS — F4310 Post-traumatic stress disorder, unspecified: Secondary | ICD-10-CM

## 2021-02-05 ENCOUNTER — Telehealth (HOSPITAL_COMMUNITY): Payer: Self-pay | Admitting: *Deleted

## 2021-02-05 DIAGNOSIS — F3342 Major depressive disorder, recurrent, in full remission: Secondary | ICD-10-CM

## 2021-02-05 DIAGNOSIS — F4310 Post-traumatic stress disorder, unspecified: Secondary | ICD-10-CM

## 2021-02-05 MED ORDER — ACCU-CHEK GUIDE VI STRP: ORAL_STRIP | 4 refills | Status: AC

## 2021-02-05 MED ORDER — ACCU-CHEK SOFTCLIX LANCETS MISC: 4 refills | Status: AC

## 2021-02-05 MED ORDER — ACCU-CHEK SOFTCLIX LANCET DEV KIT: PACK | 0 refills | Status: AC

## 2021-02-05 NOTE — Telephone Encounter (Signed)
Call from patients wife requesting medicine for patient who she states is not doing well because he has been without his medicine for a week. Reveiwed chart and patient not seen since May 2021. He needs Minipress, Effexor and Seroquel. Chart looks like he would have last had medicine in 12/22. Dr Toy Care is out of the office today, spoke with wife to explain it will be tomorrow before I can address this request with Dr Toy Care and will call her tomorrow. Offered her to go to the ED if unable to wait till tomorrow.

## 2021-02-06 MED ORDER — VENLAFAXINE HCL ER 150 MG PO CP24: 150.0000 mg | ORAL_CAPSULE | Freq: Every day | ORAL | 0 refills | Status: DC

## 2021-02-06 MED ORDER — QUETIAPINE FUMARATE 300 MG PO TABS: 300.0000 mg | ORAL_TABLET | Freq: Every day | ORAL | 0 refills | Status: DC

## 2021-02-06 MED ORDER — PRAZOSIN HCL 5 MG PO CAPS: 5.0000 mg | ORAL_CAPSULE | Freq: Every day | ORAL | 0 refills | Status: DC

## 2021-02-06 MED ORDER — VENLAFAXINE HCL ER 75 MG PO CP24: 75.0000 mg | ORAL_CAPSULE | Freq: Every day | ORAL | 0 refills | Status: DC

## 2021-02-06 NOTE — Telephone Encounter (Signed)
Called and spoke with patients wife this am re medicine being called into pharmacy for him for 90 days per Dr Toy Care. Also told her going forward he would be seen at the [facility] office but we would help to fascilitate his move and we would be in touch re this in the future.

## 2021-02-06 NOTE — Addendum Note (Signed)
Addended by: [place] Crane on: 02/06/2021 08:38 AM   Modules accepted: Orders

## 2021-02-06 NOTE — Telephone Encounter (Addendum)
This patient has not been seen since May 2021.  He is from [facility] therefore should be served by [facility] in [place]. As a courtesy I am sending 90 day prescriptions for him, so he is good for 3 months. Shawn can you please talk to Ms. Norton and explain to her that this patient needs to be transferred to the care of a provider in [place] clinic.

## 2021-02-16 ENCOUNTER — Ambulatory Visit (INDEPENDENT_AMBULATORY_CARE_PROVIDER_SITE_OTHER): Payer: Medicare Other | Admitting: Family Medicine

## 2021-02-16 ENCOUNTER — Encounter: Payer: Self-pay | Admitting: Family Medicine

## 2021-02-16 ENCOUNTER — Other Ambulatory Visit: Payer: Self-pay

## 2021-02-16 VITALS — BP 135/80 | HR 76 | Temp 97.8°F | Resp 18 | Ht 71.0 in | Wt 211.0 lb

## 2021-02-16 DIAGNOSIS — E119 Type 2 diabetes mellitus without complications: Secondary | ICD-10-CM | POA: Diagnosis not present

## 2021-02-16 DIAGNOSIS — I10 Essential (primary) hypertension: Secondary | ICD-10-CM | POA: Diagnosis not present

## 2021-02-16 LAB — POCT GLYCOSYLATED HEMOGLOBIN (HGB A1C)
Est. average glucose Bld gHb Est-mCnc: 123
Hemoglobin A1C: 5.9 % — AB (ref 4.0–5.6)

## 2021-02-16 NOTE — Progress Notes (Signed)
I,April Miller,acting as a scribe for Lelon Huh, MD.,have documented all relevant documentation on the behalf of Lelon Huh, MD,as directed by  Lelon Huh, MD while in the presence of Lelon Huh, MD.   Established patient visit   Patient: Ray Robles   DOB: 1961/11/05   60 y.o. Male  MRN: [id number] Visit Date: 02/16/2021  Today's healthcare provider: Lelon Huh, MD   Chief Complaint  Patient presents with  . Follow-up  . Diabetes  . Hypertension   Subjective    HPI  Diabetes Mellitus Type II, follow-up  Lab Results  Component Value Date   HGBA1C 7.0 (H) 11/13/2020   HGBA1C 6.6 (A) 04/28/2020   HGBA1C 5.4 01/10/2017   Last seen for diabetes 3 months ago.  Management since then includes continuing the same treatment. He reports good compliance with treatment. He is not having side effects. none  Home blood sugar records: fasting range: 119-140  Episodes of hypoglycemia? No none   Current insulin regiment: n/a Most Recent Eye Exam: past due  --------------------------------------------------------------------  Hypertension, follow-up  BP Readings from Last 3 Encounters:  02/16/21 135/80  01/29/21 124/80  11/13/20 139/70   Wt Readings from Last 3 Encounters:  02/16/21 211 lb (95.7 kg)  01/29/21 219 lb 6.4 oz (99.5 kg)  11/13/20 233 lb 12.8 oz (106.1 kg)     He was last seen for hypertension 3 months ago.  BP at that visit was 139/70. Management since that visit includes; Well controlled.  Continue current medications.  He reports good compliance with treatment. He is not having side effects. none He is not exercising. He is adherent to low salt diet.   Outside blood pressures are 150/99-190/101.  He does not smoke.  Use of agents associated with hypertension: none.   --------------------------------------------------------------------       Medications: Outpatient Medications Prior to Visit  Medication Sig  . Accu-Chek  Softclix Lancets lancets Use to check blood sugar daily for type 2 diabetes  . albuterol (VENTOLIN HFA) 108 (90 Base) MCG/ACT inhaler Inhale 2 puffs into the lungs every 6 (six) hours as needed for wheezing or shortness of breath.  . buprenorphine-naloxone (SUBOXONE) 2-0.5 mg SUBL SL tablet Place 1-2 tablets See admin instructions under the tongue. Take 2 tablets by mouth in the morning, take 1 tablet by mouth midday and take 2 tablets by mouth at bedtime  . diphenoxylate-atropine (LOMOTIL) 2.5-0.025 MG tablet Take 2 tablets by mouth every 6 (six) hours as needed for diarrhea or loose stools.   . ezetimibe (ZETIA) 10 MG tablet TAKE 1 TABLET BY MOUTH ONCE DAILY  . furosemide (LASIX) 20 MG tablet Take 20 mg by mouth daily.  [name] glucose blood (ACCU-CHEK GUIDE) test strip Use to check blood sugar daily for type 2 diabetes  . lactulose (CHRONULAC) 10 GM/15ML solution TAKE 30 MLS BY MOUTH 3 TIMES DAILY  . Lancets Misc. (ACCU-CHEK SOFTCLIX LANCET DEV) KIT Use to check blood sugar daily for type 2 diabetes  . levothyroxine (SYNTHROID) 75 MCG tablet Take 75 mcg by mouth daily.  . Melatonin 10 MG CAPS Take 10 mg at bedtime by mouth.  . metFORMIN (GLUCOPHAGE-XR) 500 MG 24 hr tablet Take 1 tablet (500 mg total) by mouth daily with breakfast.  . metoprolol tartrate (LOPRESSOR) 25 MG tablet TAKE 1 TABLET BY MOUTH TWICE DAILY WITH FOOD  . metroNIDAZOLE (METROCREAM) 0.75 % cream APPLY TOPICALLY AT BEDTIME  . mirtazapine (REMERON) 15 MG tablet Take 1 tablet (15 mg  total) by mouth at bedtime.  [name] NARCAN 4 MG/0.1ML LIQD nasal spray kit   . nitroGLYCERIN (NITROSTAT) 0.4 MG SL tablet Place 0.4 mg under the tongue every 5 (five) minutes as needed for chest pain.  [name] nystatin cream (MYCOSTATIN) Apply 1 application 3 (three) times daily as needed topically (for rash).   [name] omeprazole (PRILOSEC) 40 MG capsule Take 1 capsule (40 mg total) by mouth daily.  . ondansetron (ZOFRAN-ODT) 4 MG disintegrating tablet Take by mouth.  .  OXYGEN Inhale into the lungs.  . prazosin (MINIPRESS) 5 MG capsule TAKE 1 CAPSULE BY MOUTH AT BEDTIME  . prazosin (MINIPRESS) 5 MG capsule Take 1 capsule (5 mg total) by mouth at bedtime.  [name] QUEtiapine (SEROQUEL) 300 MG tablet TAKE 1 TABLET BY MOUTH AT BEDTIME  . QUEtiapine (SEROQUEL) 300 MG tablet Take 1 tablet (300 mg total) by mouth at bedtime.  . rifaximin (XIFAXAN) 550 MG TABS tablet Take 1 tablet by mouth 2 (two) times daily.  [name] spironolactone (ALDACTONE) 100 MG tablet Take 100 mg by mouth daily.  . tamsulosin (FLOMAX) 0.4 MG CAPS capsule TAKE 1 CAPSULE BY MOUTH ONCE EVERY EVENING  . traZODone (DESYREL) 50 MG tablet Take 1 to 2 tablets at bedtime as needed for sleep (Patient not taking: No sig reported)  . triamcinolone cream (KENALOG) 0.1 % APPLY TOPICALLY TWICE DAILY AS NEEDED  . venlafaxine XR (EFFEXOR-XR) 150 MG 24 hr capsule Take 1 capsule (150 mg total) by mouth daily.  [name] venlafaxine XR (EFFEXOR-XR) 75 MG 24 hr capsule TAKE 1 CAPSULE BY MOUTH ONCE DAILY WITH BREAKFAST  . venlafaxine XR (EFFEXOR-XR) 75 MG 24 hr capsule Take 1 capsule (75 mg total) by mouth daily with breakfast.  . Vitamin D, Ergocalciferol, (DRISDOL) 1.25 MG (50000 UNIT) CAPS capsule Take 50,000 Units by mouth every 7 (seven) days.   Facility-Administered Medications Prior to Visit  Medication Dose Route Frequency Provider  . ipratropium-albuterol (DUONEB) 0.5-2.5 (3) MG/3ML nebulizer solution 3 mL  3 mL Nebulization Once Chrismon, Vickki Muff, PA-C    Review of Systems  Constitutional: Negative for appetite change, chills and fever.  Respiratory: Negative for chest tightness, shortness of breath and wheezing.   Cardiovascular: Negative for chest pain and palpitations.  Gastrointestinal: Negative for abdominal pain, nausea and vomiting.       Objective    BP 135/80 (BP Location: Left Arm, Patient Position: Sitting, Cuff Size: Large)   Pulse 76   Temp 97.8 F (36.6 C) (Oral)   Resp 18   Ht $R'5\' 11"'qU$  (1.803 m)    Wt 211 lb (95.7 kg)   SpO2 98%   BMI 29.43 kg/m     Physical Exam    General: Appearance:     Overweight male in no acute distress  Eyes:    PERRL, conjunctiva/corneas clear, EOM's intact       Lungs:     Clear to auscultation bilaterally, respirations unlabored  Heart:    Normal heart rate. Normal rhythm. No murmurs, rubs, or gallops.   MS:   All extremities are intact.   Neurologic:   Awake, alert, oriented x 3. No apparent focal neurological           defect.        Results for orders placed or performed in visit on 02/16/21  POCT glycosylated hemoglobin (Hb A1C)  Result Value Ref Range   Hemoglobin A1C 5.9 (A) 4.0 - 5.6 %   Est. average glucose Bld gHb Est-mCnc 123  Assessment & Plan     1. Controlled type 2 diabetes mellitus without complication, without long-term current use of insulin ([place]) Well controlled.  Continue current medications.    2. Essential hypertension Well controlled.  Continue current medications.     Future Appointments  Date Time Provider [place]  07/02/2021  2:00 PM [name] Kirstie Peri, MD [facility]         The entirety of the information documented in the History of Present Illness, Review of Systems and Physical Exam were personally obtained by me. Portions of this information were initially documented by the CMA and reviewed by me for thoroughness and accuracy.      Lelon Huh, MD  [facility] [phone]) [phone] (fax)  [place]

## 2021-02-28 ENCOUNTER — Encounter: Payer: Self-pay | Admitting: *Deleted

## 2021-03-05 ENCOUNTER — Other Ambulatory Visit: Payer: Self-pay | Admitting: Family Medicine

## 2021-03-05 DIAGNOSIS — E119 Type 2 diabetes mellitus without complications: Secondary | ICD-10-CM

## 2021-03-05 NOTE — Telephone Encounter (Signed)
Requested Prescriptions  Pending Prescriptions Disp Refills  . metFORMIN (GLUCOPHAGE-XR) 500 MG 24 hr tablet [Pharmacy Med Name: METFORMIN HCL ER 500 MG TAB] 90 tablet 1    Sig: TAKE 1 TABLET BY MOUTH ONCE DAILY WITH BREAKFAST     Endocrinology:  Diabetes - Biguanides Passed - 03/05/2021 12:01 PM      Passed - Cr in normal range and within 360 days    Creatinine  Date Value Ref Range Status  03/29/2015 1.06 mg/dL Final    Comment:    0.61-1.24 NOTE: New Reference Range  02/07/15    Creatinine, Ser  Date Value Ref Range Status  11/13/2020 0.93 0.76 - 1.27 mg/dL Final         Passed - HBA1C is between 0 and 7.9 and within 180 days    Hemoglobin A1C  Date Value Ref Range Status  02/16/2021 5.9 (A) 4.0 - 5.6 % Final  03/19/2015 5.9 % Final    Comment:    4.0-6.0 NOTE: New Reference Range  02/07/15    Hgb A1c MFr Bld  Date Value Ref Range Status  11/13/2020 7.0 (H) 4.8 - 5.6 % Final    Comment:             Prediabetes: 5.7 - 6.4          Diabetes: >6.4          Glycemic control for adults with diabetes: <7.0          Passed - eGFR in normal range and within 360 days    EGFR (African American)  Date Value Ref Range Status  03/29/2015 >60  Final   GFR calc Af Amer  Date Value Ref Range Status  11/13/2020 102 >59 mL/min/1.73 Final    Comment:    **In accordance with recommendations from the NKF-ASN Task force,**   [facility] is in the process of updating its eGFR calculation to the   2021 CKD-EPI creatinine equation that estimates kidney function   without a race variable.    EGFR (Non-African Amer.)  Date Value Ref Range Status  03/29/2015 >60  Final    Comment:    eGFR values <28mL/min/1.73 m2 may be an indication of chronic kidney disease (CKD). Calculated eGFR is useful in patients with stable renal function. The eGFR calculation will not be reliable in acutely ill patients when serum creatinine is changing rapidly. It is not useful in patients on dialysis.  The eGFR calculation may not be applicable to patients at the low and high extremes of body sizes, pregnant women, and vegetarians.    GFR, Estimated  Date Value Ref Range Status  10/27/2020 >60 >60 mL/min Final    Comment:    (NOTE) Calculated using the CKD-EPI Creatinine Equation (2021)    GFR calc non Af Amer  Date Value Ref Range Status  11/13/2020 88 >59 mL/min/1.73 Final         Passed - Valid encounter within last 6 months    Recent Outpatient Visits          2 weeks ago Controlled type 2 diabetes mellitus without complication, without long-term current use of insulin [facility])   [facility] Birdie Sons, MD   3 months ago [place], Donald E, MD   6 months ago Flu-like symptoms   [facility] Birdie Sons, MD   8 months ago Other fatigue   [facility] Birdie Sons, MD   10  months ago Prediabetes   [facility] [name], Kirstie Peri, MD

## 2021-03-17 ENCOUNTER — Other Ambulatory Visit: Payer: Self-pay | Admitting: Family Medicine

## 2021-03-17 DIAGNOSIS — I10 Essential (primary) hypertension: Secondary | ICD-10-CM

## 2021-03-17 NOTE — Telephone Encounter (Signed)
Requested Prescriptions  Pending Prescriptions Disp Refills  . metoprolol tartrate (LOPRESSOR) 25 MG tablet [Pharmacy Med Name: METOPROLOL TARTRATE 25 MG TAB] 180 tablet 0    Sig: TAKE 1 TABLET BY MOUTH TWICE DAILY WITH FOOD     Cardiovascular:  Beta Blockers Passed - 03/17/2021 10:56 AM      Passed - Last BP in normal range    BP Readings from Last 1 Encounters:  02/16/21 135/80         Passed - Last Heart Rate in normal range    Pulse Readings from Last 1 Encounters:  02/16/21 76         Passed - Valid encounter within last 6 months    Recent Outpatient Visits          4 weeks ago Controlled type 2 diabetes mellitus without complication, without long-term current use of insulin [facility])   [facility] Birdie Sons, MD   4 months ago [place], Donald E, MD   6 months ago Flu-like symptoms   [facility] Birdie Sons, MD   8 months ago Other fatigue   [facility] Birdie Sons, MD   10 months ago Prediabetes   [facility] [name], Kirstie Peri, MD

## 2021-04-13 ENCOUNTER — Other Ambulatory Visit: Payer: Self-pay | Admitting: Family Medicine

## 2021-04-13 DIAGNOSIS — I10 Essential (primary) hypertension: Secondary | ICD-10-CM

## 2021-04-13 NOTE — Telephone Encounter (Signed)
Requested medications are due for refill today yes  Requested medications are on the active medication list yes  Last refill 1/25  Last visit 01/2021  Future visit scheduled yes 07/2021  Notes to clinic Historical Provider

## 2021-04-20 ENCOUNTER — Emergency Department
Admission: EM | Admit: 2021-04-20 | Discharge: 2021-04-20 | Disposition: A | Discharge status: Home / Self Care | Payer: Medicare Other | Attending: Emergency Medicine | Admitting: Emergency Medicine

## 2021-04-20 ENCOUNTER — Encounter: Payer: Self-pay | Admitting: Emergency Medicine

## 2021-04-20 ENCOUNTER — Other Ambulatory Visit: Payer: Self-pay

## 2021-04-20 DIAGNOSIS — Z5321 Procedure and treatment not carried out due to patient leaving prior to being seen by health care provider: Secondary | ICD-10-CM | POA: Diagnosis not present

## 2021-04-20 DIAGNOSIS — K4091 Unilateral inguinal hernia, without obstruction or gangrene, recurrent: Secondary | ICD-10-CM | POA: Diagnosis not present

## 2021-04-20 LAB — BASIC METABOLIC PANEL
Anion gap: 11 (ref 5–15)
BUN: 10 mg/dL (ref 8–23)
CO2: 25 mmol/L (ref 22–32)
Calcium: 9.6 mg/dL (ref 8.9–10.3)
Chloride: 103 mmol/L (ref 98–111)
Creatinine, Ser: 0.86 mg/dL (ref 0.61–1.24)
GFR, Estimated: >60 mL/min (ref >60)
Glucose, Bld: 132 mg/dL — ABNORMAL HIGH (ref 70–99)
Potassium: 4 mmol/L (ref 3.5–5.1)
Sodium: 139 mmol/L (ref 135–145)

## 2021-04-20 LAB — CBC
HCT: 40.4 % (ref 39.0–52.0)
Hemoglobin: 13.9 g/dL (ref 13.0–17.0)
MCH: 29.5 pg (ref 26.0–34.0)
MCHC: 34.4 g/dL (ref 30.0–36.0)
MCV: 85.8 fL (ref 80.0–100.0)
Platelets: 132 10*3/uL — ABNORMAL LOW (ref 150–400)
RBC: 4.71 MIL/uL (ref 4.22–5.81)
RDW: 12.6 % (ref 11.5–15.5)
WBC: 4.9 10*3/uL (ref 4.0–10.5)
nRBC: 0 % (ref 0.0–0.2)

## 2021-04-20 NOTE — ED Notes (Signed)
Called for pt to be roomed without answer.  

## 2021-04-20 NOTE — ED Notes (Signed)
Called for pt to be roomed without answer.

## 2021-04-20 NOTE — ED Triage Notes (Signed)
Pt to ED via POV with c/o R inguinal hernia that "busted out". Pt with obvious inguinal hernia to R groin. Pt states hernia popped out 2 nights ago. Pt states pain 8/10.

## 2021-04-20 NOTE — ED Notes (Signed)
Called for Pt to be roomed without answer. Checked outside and in restroom.

## 2021-04-24 ENCOUNTER — Ambulatory Visit: Payer: Self-pay

## 2021-04-24 NOTE — Telephone Encounter (Signed)
Pt. Reports he has a right inguinal hernia. Had surgery on it 2 years ago. Seen in ED 04/20/21. Surgeon can not see pt. Until "late in June." per pt. Requests appointment with his PCP. Appointment made. Instructed to go to ED for worsening of symptoms.  Reason for Disposition . Can't reduce the hernia (NO pain, local tenderness, or vomiting)  Answer Assessment - Initial Assessment Questions 1. ONSET:  "When did this first appear?"     04/20/21 2. APPEARANCE: "What does it look like?"     Softball size 3. SIZE: "How big is it?" (inches, cm or compare to coins, fruit)     Softball size 4. LOCATION: "Where exactly is the hernia located?"     Right groin 5. PATTERN: "Does the swelling come and go, or has it been constant since it started?"     Constant 6. PAIN: "Is there any pain?" If Yes, ask: "How bad is it?"  (Scale 1-10; or mild, moderate, severe)     Now- 7 7. DIAGNOSIS: "Have you been seen by a doctor (or NP/PA) for this?" "Did the doctor diagnose you as having a hernia?"     Yes 8. OTHER SYMPTOMS: "Do you have any other symptoms?" (e.g., fever, abdominal pain, vomiting)     Pain 9. PREGNANCY: "Is there any chance you are pregnant?" "When was your last menstrual period?"     n/a  Protocols used: HERNIA-A-AH

## 2021-04-27 ENCOUNTER — Other Ambulatory Visit: Payer: Self-pay

## 2021-04-27 ENCOUNTER — Encounter: Payer: Self-pay | Admitting: Family Medicine

## 2021-04-27 ENCOUNTER — Ambulatory Visit (INDEPENDENT_AMBULATORY_CARE_PROVIDER_SITE_OTHER): Payer: Medicare Other | Admitting: Family Medicine

## 2021-04-27 VITALS — BP 172/93 | HR 92 | Temp 98.8°F | Resp 16 | Ht 70.5 in | Wt 198.0 lb

## 2021-04-27 DIAGNOSIS — K4091 Unilateral inguinal hernia, without obstruction or gangrene, recurrent: Secondary | ICD-10-CM

## 2021-04-27 DIAGNOSIS — F3342 Major depressive disorder, recurrent, in full remission: Secondary | ICD-10-CM | POA: Diagnosis not present

## 2021-04-27 MED ORDER — QUETIAPINE FUMARATE 300 MG PO TABS: 300.0000 mg | ORAL_TABLET | Freq: Every day | ORAL | 1 refills | Status: DC

## 2021-04-27 NOTE — Progress Notes (Signed)
Established patient visit   Patient: Ray Robles   DOB: 07/01/1961   60 y.o. Male  MRN: [id number] Visit Date: 04/27/2021  Today's healthcare provider: Lelon Huh, MD   Chief Complaint  Patient presents with  . Inguinal Hernia   Subjective    Groin Pain This is a new problem. The current episode started in the past 7 days. The problem has been gradually worsening. The pain is moderate (can be severe). Pertinent negatives include no discolored urine, fever, frequency, hematuria or urinary retention.    Patient reports that he has had right inguinal surgery in by Dr. Tamala Julian in 2018. He had rapid onset right inguinal pain and swelling about a week ago which has gotten a little worse since then.       Medications: Outpatient Medications Prior to Visit  Medication Sig  . Accu-Chek Softclix Lancets lancets Use to check blood sugar daily for type 2 diabetes  . albuterol (VENTOLIN HFA) 108 (90 Base) MCG/ACT inhaler Inhale 2 puffs into the lungs every 6 (six) hours as needed for wheezing or shortness of breath.  . buprenorphine-naloxone (SUBOXONE) 2-0.5 mg SUBL SL tablet Place 1-2 tablets See admin instructions under the tongue. Take 2 tablets by mouth in the morning, take 1 tablet by mouth midday and take 2 tablets by mouth at bedtime  . diphenoxylate-atropine (LOMOTIL) 2.5-0.025 MG tablet Take 2 tablets by mouth every 6 (six) hours as needed for diarrhea or loose stools.   . ezetimibe (ZETIA) 10 MG tablet TAKE 1 TABLET BY MOUTH ONCE DAILY  . furosemide (LASIX) 20 MG tablet Take 20 mg by mouth daily.  [name] glucose blood (ACCU-CHEK GUIDE) test strip Use to check blood sugar daily for type 2 diabetes  . lactulose (CHRONULAC) 10 GM/15ML solution TAKE 30 MLS BY MOUTH 3 TIMES DAILY  . Lancets Misc. (ACCU-CHEK SOFTCLIX LANCET DEV) KIT Use to check blood sugar daily for type 2 diabetes  . levothyroxine (SYNTHROID) 75 MCG tablet TAKE 1 TABLET BY MOUTH ONCE DAILY  . Melatonin 10 MG CAPS  Take 10 mg at bedtime by mouth.  . metFORMIN (GLUCOPHAGE-XR) 500 MG 24 hr tablet TAKE 1 TABLET BY MOUTH ONCE DAILY WITH BREAKFAST  . metoprolol tartrate (LOPRESSOR) 25 MG tablet TAKE 1 TABLET BY MOUTH TWICE DAILY WITH FOOD  . metroNIDAZOLE (METROCREAM) 0.75 % cream APPLY TOPICALLY AT BEDTIME  . mirtazapine (REMERON) 15 MG tablet Take 1 tablet (15 mg total) by mouth at bedtime.  [name] NARCAN 4 MG/0.1ML LIQD nasal spray kit   . nitroGLYCERIN (NITROSTAT) 0.4 MG SL tablet Place 0.4 mg under the tongue every 5 (five) minutes as needed for chest pain.  [name] nystatin cream (MYCOSTATIN) Apply 1 application 3 (three) times daily as needed topically (for rash).   [name] omeprazole (PRILOSEC) 40 MG capsule Take 1 capsule (40 mg total) by mouth daily.  . ondansetron (ZOFRAN-ODT) 4 MG disintegrating tablet Take by mouth.  . OXYGEN Inhale into the lungs.  . prazosin (MINIPRESS) 5 MG capsule TAKE 1 CAPSULE BY MOUTH AT BEDTIME  . QUEtiapine (SEROQUEL) 300 MG tablet TAKE 1 TABLET BY MOUTH AT BEDTIME  . QUEtiapine (SEROQUEL) 300 MG tablet Take 1 tablet (300 mg total) by mouth at bedtime.  . rifaximin (XIFAXAN) 550 MG TABS tablet Take 1 tablet by mouth 2 (two) times daily.  [name] spironolactone (ALDACTONE) 100 MG tablet Take 100 mg by mouth daily.  . tamsulosin (FLOMAX) 0.4 MG CAPS capsule TAKE 1 CAPSULE BY MOUTH ONCE  EVERY EVENING  . triamcinolone cream (KENALOG) 0.1 % APPLY TOPICALLY TWICE DAILY AS NEEDED  . venlafaxine XR (EFFEXOR-XR) 150 MG 24 hr capsule Take 1 capsule (150 mg total) by mouth daily.  [name] venlafaxine XR (EFFEXOR-XR) 75 MG 24 hr capsule TAKE 1 CAPSULE BY MOUTH ONCE DAILY WITH BREAKFAST  . Vitamin D, Ergocalciferol, (DRISDOL) 1.25 MG (50000 UNIT) CAPS capsule Take 50,000 Units by mouth every 7 (seven) days.  . prazosin (MINIPRESS) 5 MG capsule Take 1 capsule (5 mg total) by mouth at bedtime.  . traZODone (DESYREL) 50 MG tablet Take 1 to 2 tablets at bedtime as needed for sleep (Patient not taking: No sig  reported)  . venlafaxine XR (EFFEXOR-XR) 75 MG 24 hr capsule Take 1 capsule (75 mg total) by mouth daily with breakfast.   Facility-Administered Medications Prior to Visit  Medication Dose Route Frequency Provider  . ipratropium-albuterol (DUONEB) 0.5-2.5 (3) MG/3ML nebulizer solution 3 mL  3 mL Nebulization Once Chrismon, Vickki Muff, PA-C    Review of Systems  Constitutional: Negative for fever.  Genitourinary: Negative for frequency.       Objective    BP (!) 172/93   Pulse 92   Temp 98.8 F (37.1 C)   Resp 16   Ht 5' 10.5" (1.791 m)   Wt 198 lb (89.8 kg)   BMI 28.01 kg/m     Physical Exam   Large tender inguinal mass consistent with large hernia.   Assessment & Plan     1. Unilateral recurrent inguinal hernia without obstruction or gangrene  - Urgent Ambulatory referral to General Surgery  Go to ER if changes or worsens before appointment with surgery.   2. MDD (major depressive disorder), recurrent, in full remission [facility]) He has been prescribed Seroquel by psychiatrist who he reports is no longer available. He reports this is the only medication that has every helped him sleep. refill- QUEtiapine (SEROQUEL) 300 MG tablet; Take 1 tablet (300 mg total) by mouth at bedtime.  Dispense: 90 tablet; Refill: 1       The entirety of the information documented in the History of Present Illness, Review of Systems and Physical Exam were personally obtained by me. Portions of this information were initially documented by the CMA and reviewed by me for thoroughness and accuracy.      Lelon Huh, MD  [facility] [phone] (phone) [phone] (fax)  [place]

## 2021-05-01 ENCOUNTER — Encounter: Payer: Self-pay | Admitting: General Surgery

## 2021-05-01 ENCOUNTER — Ambulatory Visit (INDEPENDENT_AMBULATORY_CARE_PROVIDER_SITE_OTHER): Payer: Medicare Other | Admitting: General Surgery

## 2021-05-01 ENCOUNTER — Other Ambulatory Visit: Payer: Self-pay

## 2021-05-01 ENCOUNTER — Telehealth: Payer: Self-pay

## 2021-05-01 VITALS — BP 169/91 | HR 94 | Temp 97.9°F | Ht 70.5 in | Wt 199.6 lb

## 2021-05-01 DIAGNOSIS — K4021 Bilateral inguinal hernia, without obstruction or gangrene, recurrent: Secondary | ICD-10-CM

## 2021-05-01 NOTE — H&P (View-Only) (Signed)
Patient ID: Ray Robles, male   DOB: 04/04/1961, 60 y.o.   MRN: [id number]  Chief Complaint  Patient presents with  . New Patient (Initial Visit)    Inguinal hernia    HPI Ray Robles is a 60 y.o. male.  He has been referred by his primary care doctor, Dr. Lelon Huh, for evaluation of a recurrent right inguinal hernia.  Ray Robles gives a history of open right inguinal hernia repairs performed in 2013 (Dr. Jamal Collin) in 2018 (Dr. Tamala Julian).  He says that he did well until about a week ago when he noticed a large bulge in his right groin.  This has been painful but has not affected his bowel function or urination.  He has not had any nausea or vomiting, nor any fevers or chills.  He saw Dr. [name] last week and was diagnosed with hernia recurrence.  He is here today to discuss repair.  Notably, he does have a history of cirrhosis and is followed by gastroenterology at [facility].  Review of the electronic medical record suggest that he is very well compensated without significant ascites and with normal coagulation parameters.   Past Medical History:  Diagnosis Date  . Anxiety   . Arthritis   . Cirrhosis ([place])   . Depression   . Diabetes mellitus without complication ([place])   . Dyspnea    doe  . Dysrhythmia   . GERD (gastroesophageal reflux disease)    food sticks in throat has esophagus stretched  . H/O bacterial pneumonia 11/11/2007   2008 and 2015   . Heart murmur   . Hepatitis C   . Hx of endoscopy    05/12/2014- [facility]. Dr Candace Cruise. Benign appearing stricture. Dilated the examination was otherwise normal.02/13/2012- Beniign appearing intrinsic mild stenosis at GE junction, successfully dialated  . Hypertension   . Hypothyroidism   . Liver disease   . Neuromuscular disorder ([place])    chronic pain peripheral neuropathy  . Pre-diabetes   . PTSD (post-traumatic stress disorder)     Past Surgical History:  Procedure Laterality Date  . COLONOSCOPY WITH PROPOFOL N/A 05/12/2015   Procedure:  COLONOSCOPY WITH PROPOFOL;  Surgeon: Hulen Luster, MD;  Location: [facility] ENDOSCOPY;  Service: Gastroenterology;  Laterality: N/A;  . edg    . HERNIA REPAIR  08/2012   Dr. Jamal Collin; [facility]  . INGUINAL HERNIA REPAIR Right 10/28/2017   Procedure: HERNIA REPAIR INGUINAL ADULT;  Surgeon: Leonie Green, MD;  Location: [facility] ORS;  Service: General;  Laterality: Right;    Family History  Problem Relation Age of Onset  . Cancer Mother   . Cancer Father   . Alcohol abuse Brother   . Diabetes Brother   . Alcohol abuse Brother   . Drug abuse Brother     Social History Social History   Tobacco Use  . Smoking status: Never Smoker  . Smokeless tobacco: Never Used  Vaping Use  . Vaping Use: Never used  Substance Use Topics  . Alcohol use: No    Alcohol/week: 0.0 standard drinks  . Drug use: Not Currently    Types: Oxycodone    Allergies  Allergen Reactions  . Ambien [Zolpidem Tartrate] Other (See Comments)    Reaction: Violent sleep  . Ibuprofen Other (See Comments)    Can't take because of liver  . Morphine Diarrhea and Nausea And Vomiting  . Morphine And Related Diarrhea and Nausea And Vomiting  . Tylenol [Acetaminophen] Other (See Comments)    Can't take  because of liver  . Xanax [Alprazolam] Other (See Comments)    Brings the ammonia levels up  . Zaleplon     seizures  . Zolpidem Other (See Comments)    Reaction: Violent Sleep    Current Outpatient Medications  Medication Sig Dispense Refill  . Accu-Chek Softclix Lancets lancets Use to check blood sugar daily for type 2 diabetes 100 each 4  . albuterol (VENTOLIN HFA) 108 (90 Base) MCG/ACT inhaler Inhale 2 puffs into the lungs every 6 (six) hours as needed for wheezing or shortness of breath. 8 g 2  . buprenorphine-naloxone (SUBOXONE) 2-0.5 mg SUBL SL tablet Place 1-2 tablets See admin instructions under the tongue. Take 2 tablets by mouth in the morning, take 1 tablet by mouth midday and take 2 tablets by mouth at bedtime     . diphenoxylate-atropine (LOMOTIL) 2.5-0.025 MG tablet Take 2 tablets by mouth every 6 (six) hours as needed for diarrhea or loose stools.     . ezetimibe (ZETIA) 10 MG tablet TAKE 1 TABLET BY MOUTH ONCE DAILY 30 tablet 11  . furosemide (LASIX) 20 MG tablet Take 20 mg by mouth daily.    [name] glucose blood (ACCU-CHEK GUIDE) test strip Use to check blood sugar daily for type 2 diabetes 100 each 4  . lactulose (CHRONULAC) 10 GM/15ML solution TAKE 30 MLS BY MOUTH 3 TIMES DAILY 240 mL 12  . Lancets Misc. (ACCU-CHEK SOFTCLIX LANCET DEV) KIT Use to check blood sugar daily for type 2 diabetes 1 kit 0  . levothyroxine (SYNTHROID) 75 MCG tablet TAKE 1 TABLET BY MOUTH ONCE DAILY 90 tablet 1  . Melatonin 10 MG CAPS Take 10 mg at bedtime by mouth.    . metFORMIN (GLUCOPHAGE-XR) 500 MG 24 hr tablet TAKE 1 TABLET BY MOUTH ONCE DAILY WITH BREAKFAST 90 tablet 1  . metoprolol tartrate (LOPRESSOR) 25 MG tablet TAKE 1 TABLET BY MOUTH TWICE DAILY WITH FOOD 180 tablet 0  . metroNIDAZOLE (METROCREAM) 0.75 % cream APPLY TOPICALLY AT BEDTIME 45 g 3  . mirtazapine (REMERON) 15 MG tablet Take 1 tablet (15 mg total) by mouth at bedtime. 90 tablet 0  . NARCAN 4 MG/0.1ML LIQD nasal spray kit     . nitroGLYCERIN (NITROSTAT) 0.4 MG SL tablet Place 0.4 mg under the tongue every 5 (five) minutes as needed for chest pain.    [name] nystatin cream (MYCOSTATIN) Apply 1 application 3 (three) times daily as needed topically (for rash).     [name] omeprazole (PRILOSEC) 40 MG capsule Take 1 capsule (40 mg total) by mouth daily. 90 capsule 3  . ondansetron (ZOFRAN-ODT) 4 MG disintegrating tablet Take by mouth.    . OXYGEN Inhale into the lungs.    . prazosin (MINIPRESS) 5 MG capsule TAKE 1 CAPSULE BY MOUTH AT BEDTIME 90 capsule 0  . QUEtiapine (SEROQUEL) 300 MG tablet Take 1 tablet (300 mg total) by mouth at bedtime. 90 tablet 1  . rifaximin (XIFAXAN) 550 MG TABS tablet Take 1 tablet by mouth 2 (two) times daily.    [name] spironolactone (ALDACTONE)  100 MG tablet Take 100 mg by mouth daily.    . tamsulosin (FLOMAX) 0.4 MG CAPS capsule TAKE 1 CAPSULE BY MOUTH ONCE EVERY EVENING 90 capsule 2  . triamcinolone cream (KENALOG) 0.1 % APPLY TOPICALLY TWICE DAILY AS NEEDED 30 g 2  . venlafaxine XR (EFFEXOR-XR) 150 MG 24 hr capsule Take 1 capsule (150 mg total) by mouth daily. 90 capsule 0  . venlafaxine XR (EFFEXOR-XR)  75 MG 24 hr capsule TAKE 1 CAPSULE BY MOUTH ONCE DAILY WITH BREAKFAST 90 capsule 0  . Vitamin D, Ergocalciferol, (DRISDOL) 1.25 MG (50000 UNIT) CAPS capsule Take 50,000 Units by mouth every 7 (seven) days.     Current Facility-Administered Medications  Medication Dose Route Frequency Provider Last Rate Last Admin  . ipratropium-albuterol (DUONEB) 0.5-2.5 (3) MG/3ML nebulizer solution 3 mL  3 mL Nebulization Once Chrismon, Vickki Muff, PA-C        Review of Systems Review of Systems  Constitutional: Positive for unexpected weight change.  HENT: Positive for tinnitus.   Eyes: Positive for visual disturbance.  Respiratory: Positive for shortness of breath.   Cardiovascular: Positive for leg swelling.       Claudication  Gastrointestinal: Positive for constipation.       GERD  Neurological: Positive for headaches.  Psychiatric/Behavioral: Positive for dysphoric mood.  All other systems reviewed and are negative.   Blood pressure (!) 169/91, pulse 94, temperature 97.9 F (36.6 C), temperature source Oral, height 5' 10.5" (1.791 m), weight 199 lb 9.6 oz (90.5 kg), SpO2 97 %. Body mass index is 28.23 kg/m.  Physical Exam Physical Exam Exam conducted with a chaperone present.  Constitutional:      General: He is not in acute distress.    Appearance: Normal appearance. He is obese.  HENT:     Head: Normocephalic and atraumatic.     Nose:     Comments: Covered with a mask    Mouth/Throat:     Comments: Covered with a mask Eyes:     General: No scleral icterus.       Right eye: No discharge.        Left eye: No discharge.   Neck:     Comments: No palpable cervical or supraclavicular lymphadenopathy.  The trachea is midline.  No dominant thyroid masses or thyromegaly appreciated.  The gland moves freely with deglutition. Cardiovascular:     Rate and Rhythm: Normal rate and regular rhythm.     Pulses: Normal pulses.  Pulmonary:     Effort: Pulmonary effort is normal.     Breath sounds: Normal breath sounds.  Abdominal:     General: There is no distension.     Palpations: Abdomen is soft.     Hernia: A hernia is present. Hernia is present in the right inguinal area.     Comments: No detectable fluid wave.  Genitourinary:      Comments: There is a right inguinal hernia clearly visible.  It is firm and somewhat tender.  I did not make a substantial effort to reduce it. Musculoskeletal:     Right lower leg: Edema present.     Left lower leg: Edema present.     Comments: 1+ nonpitting ankle edema.  Skin:    Coloration: Skin is not jaundiced.  Neurological:     General: No focal deficit present.     Mental Status: He is alert and oriented to person, place, and time.  Psychiatric:        Mood and Affect: Mood normal.        Behavior: Behavior normal.     Data Reviewed Labs reviewed via the electronic medical record demonstrate slightly decreased platelets at 147, normal bilirubin, normal renal function, mildly elevated alkaline phosphatase at 152.  Alpha-fetoprotein was not elevated.  I reviewed the report of an outside MRI performed in 2020.  I was able to review the images but the report is copied here:  EXAM: MRI ABDOMEN W WO CONTRAST  DATE: 09/06/2019  ACCESSION: [id number]  DICTATED: 09/06/2019 2:24 PM  INTERPRETATION LOCATION: [place]   CLINICAL INDICATION: 60 years old Male with [place] screening. + History of cirrhosis and HCV ; Liver disease, chronic, HCC screening ; Portal hypertension - K74.60 - Cirrhosis of liver without ascites, unspecified hepatic cirrhosis type (CMS - HCC)     COMPARISON: CT abdomen pelvis with contrast 05/18/2019; MR abdomen 01/25/2019.   TECHNIQUE: MRI of the abdomen was obtained without [and with IV contrast. Multisequence, multiplanar images were obtained.]    FINDINGS:   LINES/DEVICES: None.   LOWER CHEST: Unchanged appearance of right-sided posterior fat-containing diaphragmatic hernia. Similar appearance of fluid containing structures in the lower mediastinum immediately adjacent to the esophagus, likely fluid within the hiatal hernia.   ABDOMEN/PELVIS   HEPATOBILIARY: The liver is cirrhotic with a nodular contour and left hepatic hypertrophy.   No early arterial enhancing or washout lesions are identified.   No biliary ductal dilatation. Gallbladder is unremarkable.   PANCREAS: Atrophic. Nondilated main pancreatic duct.  SPLEEN: Similar splenomegaly measuring 14.6 cm craniocaudally. Small splenule.  ADRENAL GLANDS: Unremarkable.  KIDNEYS/URETERS: Symmetric nephrograms. Stable simple right renal cysts. No enhancing renal mass or hydronephrosis.  BOWEL/PERITONEUM/RETROPERITONEUM: Small hiatal hernia. No bowel obstruction. Colonic diverticulosis. No acute inflammatory process. No ascites.  VASCULATURE: Abdominal aorta is patent and normal in caliber. Common hepatic artery arises directly from the aorta. Patent portal venous system. Unchanged small caliber perisplenic varices. IVC and hepatic veins are patent.  LYMPH NODES: Unchanged prominent subcentimeter right upper quadrant lymph nodes, likely reactive.   BONES/SOFT TISSUES: No focal enhancing osseous lesions. Rectus diastases.   IMPRESSION:   Cirrhosis with sequela of portal hypertension.   No evidence of HCC.   Interval resolution of acute uncomplicated pancreatitis.   Additional chronic/incidental findings, as described above.   ______________________   LI-RADS 5 = Definitely hepatocellular carcinoma (concordant with OPTN 5)  LI-RADS 4 = Probably hepatocellular  carcinoma  LI-RADS 3 = Indeterminate  LI-RADS 2 = Probably benign  LI-RADS 1 = Definitely benign   NOTE: The LI-RADS / OPTN classification of liver lesions has been adopted to standardize CT and MRI scan reporting in patients at risk for hepatocellular carcinoma. The imaging criteria for definite hepatocellular carcinoma are concordant for the LI-RADS and OPTN systems. LI-RADS criteria and documentation are available online at http://garrison-lawrence.org/. This report utilizes LI-RADS version 2018.  I also reviewed the report of the CT scan done in 2020.  Once again, the images are not available but the report is copied here:  Interface, Rad Results In - 05/19/2019 8:03 AM EDT  EXAM: CT ABDOMEN PELVIS W CONTRAST  DATE: 05/18/2019 9:57 PM  ACCESSION: [id number]  DICTATED: 05/18/2019 10:15 PM  INTERPRETATION LOCATION: [place]: cirrhotic with diffuse nonspecific abdominal pain, known COVID positive 2 weeks ago ; Abdominal pain, acute, nonlocalized    COMPARISON: Abdominal MRI 01/25/2019   TECHNIQUE: A spiral CT scan of the abdomen and pelvis was obtained with IV contrast from the lung bases throughthe pubic symphysis. Images were reconstructed in the axial plane. Coronal and sagittal reformatted images were also provided for further evaluation.   FINDINGS:   LINES AND TUBES: None.   LOWER THORAX: Small fat-containing right Bochdalek hernia. Moderate sized hiatal hernia predominantly containing fat and small volume free fluid.   HEPATOBILIARY: Nodular liver contour. Right posterior hepatic notch. Hypertrophy of liver segments 2 and 3. The gallbladder is present and  otherwise unremarkable. Nobiliary dilatation.   SPLEEN: Splenomegaly measuring 15.6 cm in maximum dimension.  PANCREAS: Diffuse pancreatic atrophy. Peripancreatic fat stranding with small-volume fluid buildup along Gerota's fascia, left greater than right. No well-defined fluidcollections. No  peripancreatic vascular abnormality.   ADRENALS: Unremarkable.  KIDNEYS/URETERS: Simple right upper pole renal cyst   BLADDER: Unremarkable.  PELVIC/REPRODUCTIVE ORGANS: Unremarkable.   GI TRACT: No dilated or thick walled loops of bowel. Normal appendix. Hiatal hernia as described above.   PERITONEUM/RETROPERITONEUM AND MESENTERY: No free air or fluid.  LYMPH NODES: No enlarged lymph nodes.  VESSELS: The aorta is normal in caliber. No significant calcified atherosclerotic disease. The portal venous system is patent. Large caliber splenic vein suggestive of portal hypertension. The hepatic veins and IVC are unremarkable.   BONES AND SOFT TISSUES: Large bilateral fat-containing inguinal hernias. The right inguinal hernia contains fluid and demonstrates adjacent inflammatory changes. Diffuse osteopenia. T12 vertebral body compression deformity.    IMPRESSION:  Acute uncomplicated pancreatitis.   Cirrhotic liver morphology with evidence of portal hypertension.   Inguinal, hiatal and Bochdalek hernias as described above.   ====================  ADDENDUM (05/19/2019 8:00 AM):  On review, the following additional findings were noted:   1. Small volume free fluid within the abdomen, likely related to underlying pancreatitis.  2. Sclerotic bone island within the left eighth lateral rib.  I was able to personally review the images of a CT scan performed in 2018.  Bilateral inguinal hernias are visualized.  Assessment Impression and plan: This is a 60 year old man who has had a right inguinal hernia repaired on 2 separate occasions.  He states that both times, the repair was done open.  He has had a recurrence.  He also has a left-sided inguinal hernia.  I have recommended that he undergo robot-assisted laparoscopic repair of both hernias.  Plan I have explained the procedure, risks, and aftercare of inguinal hernia repair to [employer]   Risks include but are not limited to  bleeding, infection, wound problems, anesthesia, recurrence, bladder or intestine injury, urinary retention, testicular dysfunction, chronic pain, mesh problems.  He  seems to understand and agrees to proceed.  Questions were answered to his stated satisfaction.  We will work on getting him scheduled.  We will obtain medical clearance from his gastroenterology group.  I will place a consultation to anesthesiology, secondary to his history of cirrhosis, although I do think he is well compensated at this point.    Fredirick Maudlin 05/01/2021, 3:55 PM

## 2021-05-01 NOTE — Patient Instructions (Addendum)
Our surgery scheduler Pamala Hurry will call you within 24-48 to get you scheduled. If you have not heard from her after 48 hours, please call our office. You will not  need to get Covid tested before surgery and have the blue sheet available when she calls to write down important information.  If you have any concerns or questions, please feel free to call our office.       Inguinal Hernia, Adult An inguinal hernia is when fat or your intestines push through a weak spot in a muscle where your leg meets your lower belly (groin). This causes a bulge. This kind of hernia could also be:  In your scrotum, if you are male.  In folds of skin around your vagina, if you are male. There are three types of inguinal hernias:  Hernias that can be pushed back into the belly (are reducible). This type rarely causes pain.  Hernias that cannot be pushed back into the belly (are incarcerated).  Hernias that cannot be pushed back into the belly and lose their blood supply (are strangulated). This type needs emergency surgery. What are the causes? This condition is caused by having a weak spot in the muscles or tissues in your groin. This develops over time. The hernia may poke through the weak spot when you strain your lower belly muscles all of a sudden, such as when you:  Lift a heavy object.  Strain to poop (have a bowel movement). Trouble pooping (constipation) can lead to straining.  Cough. What increases the risk? This condition is more likely to develop in:  Males.  Pregnant females.  People who: ? Are overweight. ? Work in jobs that require long periods of standing or heavy lifting. ? Have had an inguinal hernia before. ? Smoke or have lung disease. These factors can lead to long-term (chronic) coughing. What are the signs or symptoms? Symptoms may depend on the size of the hernia. Often, a small hernia has no symptoms. Symptoms of a larger hernia may include:  A bulge in the groin  area. This is easier to see when standing. You might not be able to see it when you are lying down.  Pain or burning in the groin. This may get worse when you lift, strain, or cough.  A dull ache or a feeling of pressure in the groin.  An abnormal bulge in the scrotum, in males. Symptoms of a strangulated inguinal hernia may include:  A bulge in your groin that is very painful and tender to the touch.  A bulge that turns red or purple.  Fever, feeling like you may vomit (nausea), and vomiting.  Not being able to poop or to pass gas. How is this treated? Treatment depends on the size of your hernia and whether you have symptoms. If you do not have symptoms, your doctor may have you watch your hernia carefully and have you come in for follow-up visits. If your hernia is large or if you have symptoms, you may need surgery to repair the hernia. Follow these instructions at home: Lifestyle  Avoid lifting heavy objects.  Avoid standing for long amounts of time.  Do not smoke or use any products that contain nicotine or tobacco. If you need help quitting, ask your doctor.  Stay at a healthy weight. Prevent trouble pooping You may need to take these actions to prevent or treat trouble pooping:  Drink enough fluid to keep your pee (urine) pale yellow.  Take over-the-counter or prescription medicines.  Eat foods that are high in fiber. These include beans, whole grains, and fresh fruits and vegetables.  Limit foods that are high in fat and sugar. These include fried or sweet foods. General instructions  You may try to push your hernia back in place by very gently pressing on it when you are lying down. Do not try to push the bulge back in if it will not go in easily.  Watch your hernia for any changes in shape, size, or color. Tell your doctor if you see any changes.  Take over-the-counter and prescription medicines only as told by your doctor.  Keep all follow-up visits. Contact  a doctor if:  You have a fever or chills.  You have new symptoms.  Your symptoms get worse. Get help right away if:  You have pain in your groin that gets worse all of a sudden.  You have a bulge in your groin that: ? Gets bigger all of a sudden, and it does not get smaller after that. ? Turns red or purple. ? Is painful when you touch it.  You are a male, and you have: ? Sudden pain in your scrotum. ? A sudden change in the size of your scrotum.  You cannot push the hernia back in place by very gently pressing on it when you are lying down.  You feel like you may vomit, and that feeling does not go away.  You keep vomiting.  You have a fast heartbeat.  You cannot poop or pass gas. These symptoms may be an emergency. Get help right away. Call your local emergency services (911 in the [place]  Do not wait to see if the symptoms will go away.  Do not drive yourself to the hospital. Summary  An inguinal hernia is when fat or your intestines push through a weak spot in a muscle where your leg meets your lower belly (groin). This causes a bulge.  If you do not have symptoms, you may not need treatment. If you have symptoms or a large hernia, you may need surgery.  Avoid lifting heavy objects. Also, avoid standing for long amounts of time.  Do not try to push the bulge back in if it will not go in easily. This information is not intended to replace advice given to you by your health care provider. Make sure you discuss any questions you have with your health care provider. Document Revised: 07/18/2020 Document Reviewed: 07/18/2020 Elsevier Patient Education  2021 [employer].

## 2021-05-01 NOTE — Progress Notes (Signed)
Patient ID: Ray Robles, male   DOB: 04/04/1961, 60 y.o.   MRN: [id number]  Chief Complaint  Patient presents with  . New Patient (Initial Visit)    Inguinal hernia    HPI Ray Robles is a 60 y.o. male.  He has been referred by his primary care doctor, Dr. Lelon Robles, for evaluation of a recurrent right inguinal hernia.  Ray Robles gives a history of open right inguinal hernia repairs performed in 2013 (Dr. Jamal Collin) in 2018 (Dr. Tamala Julian).  He says that he did well until about a week ago when he noticed a large bulge in his right groin.  This has been painful but has not affected his bowel function or urination.  He has not had any nausea or vomiting, nor any fevers or chills.  He saw Dr. [name] last week and was diagnosed with hernia recurrence.  He is here today to discuss repair.  Notably, he does have a history of cirrhosis and is followed by gastroenterology at [facility].  Review of the electronic medical record suggest that he is very well compensated without significant ascites and with normal coagulation parameters.   Past Medical History:  Diagnosis Date  . Anxiety   . Arthritis   . Cirrhosis ([place])   . Depression   . Diabetes mellitus without complication ([place])   . Dyspnea    doe  . Dysrhythmia   . GERD (gastroesophageal reflux disease)    food sticks in throat has esophagus stretched  . H/O bacterial pneumonia 11/11/2007   2008 and 2015   . Heart murmur   . Hepatitis C   . Hx of endoscopy    05/12/2014- [facility]. Dr Candace Cruise. Benign appearing stricture. Dilated the examination was otherwise normal.02/13/2012- Beniign appearing intrinsic mild stenosis at GE junction, successfully dialated  . Hypertension   . Hypothyroidism   . Liver disease   . Neuromuscular disorder ([place])    chronic pain peripheral neuropathy  . Pre-diabetes   . PTSD (post-traumatic stress disorder)     Past Surgical History:  Procedure Laterality Date  . COLONOSCOPY WITH PROPOFOL N/A 05/12/2015   Procedure:  COLONOSCOPY WITH PROPOFOL;  Surgeon: Hulen Luster, MD;  Location: [facility] ENDOSCOPY;  Service: Gastroenterology;  Laterality: N/A;  . edg    . HERNIA REPAIR  08/2012   Dr. Jamal Collin; [facility]  . INGUINAL HERNIA REPAIR Right 10/28/2017   Procedure: HERNIA REPAIR INGUINAL ADULT;  Surgeon: Leonie Green, MD;  Location: [facility] ORS;  Service: General;  Laterality: Right;    Family History  Problem Relation Age of Onset  . Cancer Mother   . Cancer Father   . Alcohol abuse Brother   . Diabetes Brother   . Alcohol abuse Brother   . Drug abuse Brother     Social History Social History   Tobacco Use  . Smoking status: Never Smoker  . Smokeless tobacco: Never Used  Vaping Use  . Vaping Use: Never used  Substance Use Topics  . Alcohol use: No    Alcohol/week: 0.0 standard drinks  . Drug use: Not Currently    Types: Oxycodone    Allergies  Allergen Reactions  . Ambien [Zolpidem Tartrate] Other (See Comments)    Reaction: Violent sleep  . Ibuprofen Other (See Comments)    Can't take because of liver  . Morphine Diarrhea and Nausea And Vomiting  . Morphine And Related Diarrhea and Nausea And Vomiting  . Tylenol [Acetaminophen] Other (See Comments)    Can't take  because of liver  . Xanax [Alprazolam] Other (See Comments)    Brings the ammonia levels up  . Zaleplon     seizures  . Zolpidem Other (See Comments)    Reaction: Violent Sleep    Current Outpatient Medications  Medication Sig Dispense Refill  . Accu-Chek Softclix Lancets lancets Use to check blood sugar daily for type 2 diabetes 100 each 4  . albuterol (VENTOLIN HFA) 108 (90 Base) MCG/ACT inhaler Inhale 2 puffs into the lungs every 6 (six) hours as needed for wheezing or shortness of breath. 8 g 2  . buprenorphine-naloxone (SUBOXONE) 2-0.5 mg SUBL SL tablet Place 1-2 tablets See admin instructions under the tongue. Take 2 tablets by mouth in the morning, take 1 tablet by mouth midday and take 2 tablets by mouth at bedtime     . diphenoxylate-atropine (LOMOTIL) 2.5-0.025 MG tablet Take 2 tablets by mouth every 6 (six) hours as needed for diarrhea or loose stools.     . ezetimibe (ZETIA) 10 MG tablet TAKE 1 TABLET BY MOUTH ONCE DAILY 30 tablet 11  . furosemide (LASIX) 20 MG tablet Take 20 mg by mouth daily.    . glucose blood (ACCU-CHEK GUIDE) test strip Use to check blood sugar daily for type 2 diabetes 100 each 4  . lactulose (CHRONULAC) 10 GM/15ML solution TAKE 30 MLS BY MOUTH 3 TIMES DAILY 240 mL 12  . Lancets Misc. (ACCU-CHEK SOFTCLIX LANCET DEV) KIT Use to check blood sugar daily for type 2 diabetes 1 kit 0  . levothyroxine (SYNTHROID) 75 MCG tablet TAKE 1 TABLET BY MOUTH ONCE DAILY 90 tablet 1  . Melatonin 10 MG CAPS Take 10 mg at bedtime by mouth.    . metFORMIN (GLUCOPHAGE-XR) 500 MG 24 hr tablet TAKE 1 TABLET BY MOUTH ONCE DAILY WITH BREAKFAST 90 tablet 1  . metoprolol tartrate (LOPRESSOR) 25 MG tablet TAKE 1 TABLET BY MOUTH TWICE DAILY WITH FOOD 180 tablet 0  . metroNIDAZOLE (METROCREAM) 0.75 % cream APPLY TOPICALLY AT BEDTIME 45 g 3  . mirtazapine (REMERON) 15 MG tablet Take 1 tablet (15 mg total) by mouth at bedtime. 90 tablet 0  . NARCAN 4 MG/0.1ML LIQD nasal spray kit     . nitroGLYCERIN (NITROSTAT) 0.4 MG SL tablet Place 0.4 mg under the tongue every 5 (five) minutes as needed for chest pain.    . nystatin cream (MYCOSTATIN) Apply 1 application 3 (three) times daily as needed topically (for rash).     . omeprazole (PRILOSEC) 40 MG capsule Take 1 capsule (40 mg total) by mouth daily. 90 capsule 3  . ondansetron (ZOFRAN-ODT) 4 MG disintegrating tablet Take by mouth.    . OXYGEN Inhale into the lungs.    . prazosin (MINIPRESS) 5 MG capsule TAKE 1 CAPSULE BY MOUTH AT BEDTIME 90 capsule 0  . QUEtiapine (SEROQUEL) 300 MG tablet Take 1 tablet (300 mg total) by mouth at bedtime. 90 tablet 1  . rifaximin (XIFAXAN) 550 MG TABS tablet Take 1 tablet by mouth 2 (two) times daily.    . spironolactone (ALDACTONE)  100 MG tablet Take 100 mg by mouth daily.    . tamsulosin (FLOMAX) 0.4 MG CAPS capsule TAKE 1 CAPSULE BY MOUTH ONCE EVERY EVENING 90 capsule 2  . triamcinolone cream (KENALOG) 0.1 % APPLY TOPICALLY TWICE DAILY AS NEEDED 30 g 2  . venlafaxine XR (EFFEXOR-XR) 150 MG 24 hr capsule Take 1 capsule (150 mg total) by mouth daily. 90 capsule 0  . venlafaxine XR (EFFEXOR-XR)   75 MG 24 hr capsule TAKE 1 CAPSULE BY MOUTH ONCE DAILY WITH BREAKFAST 90 capsule 0  . Vitamin D, Ergocalciferol, (DRISDOL) 1.25 MG (50000 UNIT) CAPS capsule Take 50,000 Units by mouth every 7 (seven) days.     Current Facility-Administered Medications  Medication Dose Route Frequency Provider Last Rate Last Admin  . ipratropium-albuterol (DUONEB) 0.5-2.5 (3) MG/3ML nebulizer solution 3 mL  3 mL Nebulization Once Chrismon, Dennis E, PA-C        Review of Systems Review of Systems  Constitutional: Positive for unexpected weight change.  HENT: Positive for tinnitus.   Eyes: Positive for visual disturbance.  Respiratory: Positive for shortness of breath.   Cardiovascular: Positive for leg swelling.       Claudication  Gastrointestinal: Positive for constipation.       GERD  Neurological: Positive for headaches.  Psychiatric/Behavioral: Positive for dysphoric mood.  All other systems reviewed and are negative.   Blood pressure (!) 169/91, pulse 94, temperature 97.9 F (36.6 C), temperature source Oral, height 5' 10.5" (1.791 m), weight 199 lb 9.6 oz (90.5 kg), SpO2 97 %. Body mass index is 28.23 kg/m.  Physical Exam Physical Exam Exam conducted with a chaperone present.  Constitutional:      General: He is not in acute distress.    Appearance: Normal appearance. He is obese.  HENT:     Head: Normocephalic and atraumatic.     Nose:     Comments: Covered with a mask    Mouth/Throat:     Comments: Covered with a mask Eyes:     General: No scleral icterus.       Right eye: No discharge.        Left eye: No discharge.   Neck:     Comments: No palpable cervical or supraclavicular lymphadenopathy.  The trachea is midline.  No dominant thyroid masses or thyromegaly appreciated.  The gland moves freely with deglutition. Cardiovascular:     Rate and Rhythm: Normal rate and regular rhythm.     Pulses: Normal pulses.  Pulmonary:     Effort: Pulmonary effort is normal.     Breath sounds: Normal breath sounds.  Abdominal:     General: There is no distension.     Palpations: Abdomen is soft.     Hernia: A hernia is present. Hernia is present in the right inguinal area.     Comments: No detectable fluid wave.  Genitourinary:      Comments: There is a right inguinal hernia clearly visible.  It is firm and somewhat tender.  I did not make a substantial effort to reduce it. Musculoskeletal:     Right lower leg: Edema present.     Left lower leg: Edema present.     Comments: 1+ nonpitting ankle edema.  Skin:    Coloration: Skin is not jaundiced.  Neurological:     General: No focal deficit present.     Mental Status: He is alert and oriented to person, place, and time.  Psychiatric:        Mood and Affect: Mood normal.        Behavior: Behavior normal.     Data Reviewed Labs reviewed via the electronic medical record demonstrate slightly decreased platelets at 147, normal bilirubin, normal renal function, mildly elevated alkaline phosphatase at 152.  Alpha-fetoprotein was not elevated.  I reviewed the report of an outside MRI performed in 2020.  I was able to review the images but the report is copied here:    EXAM: MRI ABDOMEN W WO CONTRAST  DATE: 09/06/2019  ACCESSION: [id number]  DICTATED: 09/06/2019 2:24 PM  INTERPRETATION LOCATION: [facility]   CLINICAL INDICATION: 60 years old Male with HCC screening. + History of cirrhosis and HCV ; Liver disease, chronic, HCC screening ; Portal hypertension - K74.60 - Cirrhosis of liver without ascites, unspecified hepatic cirrhosis type (CMS - HCC)     COMPARISON: CT abdomen pelvis with contrast 05/18/2019; MR abdomen 01/25/2019.   TECHNIQUE: MRI of the abdomen was obtained without [and with IV contrast. Multisequence, multiplanar images were obtained.]    FINDINGS:   LINES/DEVICES: None.   LOWER CHEST: Unchanged appearance of right-sided posterior fat-containing diaphragmatic hernia. Similar appearance of fluid containing structures in the lower mediastinum immediately adjacent to the esophagus, likely fluid within the hiatal hernia.   ABDOMEN/PELVIS   HEPATOBILIARY: The liver is cirrhotic with a nodular contour and left hepatic hypertrophy.   No early arterial enhancing or washout lesions are identified.   No biliary ductal dilatation. Gallbladder is unremarkable.   PANCREAS: Atrophic. Nondilated main pancreatic duct.  SPLEEN: Similar splenomegaly measuring 14.6 cm craniocaudally. Small splenule.  ADRENAL GLANDS: Unremarkable.  KIDNEYS/URETERS: Symmetric nephrograms. Stable simple right renal cysts. No enhancing renal mass or hydronephrosis.  BOWEL/PERITONEUM/RETROPERITONEUM: Small hiatal hernia. No bowel obstruction. Colonic diverticulosis. No acute inflammatory process. No ascites.  VASCULATURE: Abdominal aorta is patent and normal in caliber. Common hepatic artery arises directly from the aorta. Patent portal venous system. Unchanged small caliber perisplenic varices. IVC and hepatic veins are patent.  LYMPH NODES: Unchanged prominent subcentimeter right upper quadrant lymph nodes, likely reactive.   BONES/SOFT TISSUES: No focal enhancing osseous lesions. Rectus diastases.   IMPRESSION:   Cirrhosis with sequela of portal hypertension.   No evidence of HCC.   Interval resolution of acute uncomplicated pancreatitis.   Additional chronic/incidental findings, as described above.   ______________________   LI-RADS 5 = Definitely hepatocellular carcinoma (concordant with OPTN 5)  LI-RADS 4 = Probably hepatocellular  carcinoma  LI-RADS 3 = Indeterminate  LI-RADS 2 = Probably benign  LI-RADS 1 = Definitely benign   NOTE: The LI-RADS / OPTN classification of liver lesions has been adopted to standardize CT and MRI scan reporting in patients at risk for hepatocellular carcinoma. The imaging criteria for definite hepatocellular carcinoma are concordant for the LI-RADS and OPTN systems. LI-RADS criteria and documentation are available online at www.acr.org/LI-RADS. This report utilizes LI-RADS version 2018.  I also reviewed the report of the CT scan done in 2020.  Once again, the images are not available but the report is copied here:  Interface, Rad Results In - 05/19/2019 8:03 AM EDT  EXAM: CT ABDOMEN PELVIS W CONTRAST  DATE: 05/18/2019 9:57 PM  ACCESSION: [id number]  DICTATED: 05/18/2019 10:15 PM  INTERPRETATION LOCATION: [facility]   CLINICAL INDICATION: cirrhotic with diffuse nonspecific abdominal pain, known COVID positive 2 weeks ago ; Abdominal pain, acute, nonlocalized    COMPARISON: Abdominal MRI 01/25/2019   TECHNIQUE: A spiral CT scan of the abdomen and pelvis was obtained with IV contrast from the lung bases throughthe pubic symphysis. Images were reconstructed in the axial plane. Coronal and sagittal reformatted images were also provided for further evaluation.   FINDINGS:   LINES AND TUBES: None.   LOWER THORAX: Small fat-containing right Bochdalek hernia. Moderate sized hiatal hernia predominantly containing fat and small volume free fluid.   HEPATOBILIARY: Nodular liver contour. Right posterior hepatic notch. Hypertrophy of liver segments 2 and 3. The gallbladder is present and   otherwise unremarkable. Nobiliary dilatation.   SPLEEN: Splenomegaly measuring 15.6 cm in maximum dimension.  PANCREAS: Diffuse pancreatic atrophy. Peripancreatic fat stranding with small-volume fluid buildup along Gerota's fascia, left greater than right. No well-defined fluidcollections. No  peripancreatic vascular abnormality.   ADRENALS: Unremarkable.  KIDNEYS/URETERS: Simple right upper pole renal cyst   BLADDER: Unremarkable.  PELVIC/REPRODUCTIVE ORGANS: Unremarkable.   GI TRACT: No dilated or thick walled loops of bowel. Normal appendix. Hiatal hernia as described above.   PERITONEUM/RETROPERITONEUM AND MESENTERY: No free air or fluid.  LYMPH NODES: No enlarged lymph nodes.  VESSELS: The aorta is normal in caliber. No significant calcified atherosclerotic disease. The portal venous system is patent. Large caliber splenic vein suggestive of portal hypertension. The hepatic veins and IVC are unremarkable.   BONES AND SOFT TISSUES: Large bilateral fat-containing inguinal hernias. The right inguinal hernia contains fluid and demonstrates adjacent inflammatory changes. Diffuse osteopenia. T12 vertebral body compression deformity.    IMPRESSION:  Acute uncomplicated pancreatitis.   Cirrhotic liver morphology with evidence of portal hypertension.   Inguinal, hiatal and Bochdalek hernias as described above.   ====================  ADDENDUM (05/19/2019 8:00 AM):  On review, the following additional findings were noted:   1. Small volume free fluid within the abdomen, likely related to underlying pancreatitis.  2. Sclerotic bone island within the left eighth lateral rib.  I was able to personally review the images of a CT scan performed in 2018.  Bilateral inguinal hernias are visualized.  Assessment Impression and plan: This is a 59-year-old man who has had a right inguinal hernia repaired on 2 separate occasions.  He states that both times, the repair was done open.  He has had a recurrence.  He also has a left-sided inguinal hernia.  I have recommended that he undergo robot-assisted laparoscopic repair of both hernias.  Plan I have explained the procedure, risks, and aftercare of inguinal hernia repair to Cadel J Kirstein.   Risks include but are not limited to  bleeding, infection, wound problems, anesthesia, recurrence, bladder or intestine injury, urinary retention, testicular dysfunction, chronic pain, mesh problems.  He  seems to understand and agrees to proceed.  Questions were answered to his stated satisfaction.  We will work on getting him scheduled.  We will obtain medical clearance from his gastroenterology group.  I will place a consultation to anesthesiology, secondary to his history of cirrhosis, although I do think he is well compensated at this point.    Josehua Hammar 05/01/2021, 3:55 PM   

## 2021-05-01 NOTE — Telephone Encounter (Signed)
Faxed medical clearance to [name], NP at [phone].

## 2021-05-02 ENCOUNTER — Telehealth: Payer: Self-pay | Admitting: General Surgery

## 2021-05-02 NOTE — Telephone Encounter (Signed)
Patient has been advised of Pre-Admission date/time, COVID Testing date and Surgery date.  Surgery Date: 05/14/21 Preadmission Testing Date: 05/09/21 (phone 8a-1p) Covid Testing Date: Not needed.     Patient has been made aware to call [phone], between 1-3:00pm the day before surgery, to find out what time to arrive for surgery.

## 2021-05-04 ENCOUNTER — Telehealth: Payer: Self-pay

## 2021-05-04 NOTE — Telephone Encounter (Signed)
Received medical clearance from [name], NP. Per Dawn: The biggest concern which needs to be prioritized for the surgery are avoidance of fluid overload, over sedation, and infection prevention. See VOCAL-Penn Score on attached sheet.

## 2021-05-09 ENCOUNTER — Other Ambulatory Visit
Admission: RE | Admit: 2021-05-09 | Discharge: 2021-05-09 | Disposition: A | Discharge status: Home / Self Care | Payer: Medicare Other | Source: Ambulatory Visit | Attending: General Surgery | Admitting: General Surgery

## 2021-05-09 NOTE — Patient Instructions (Addendum)
Your procedure is scheduled on: Report to the Registration Desk on the 1st floor of the [facility]. To find out your arrival time, please call [phone] between 1PM - 3PM on:  REMEMBER: Instructions that are not followed completely may result in serious medical risk, up to and including death; or upon the discretion of your surgeon and anesthesiologist your surgery may need to be rescheduled.  Do not eat food after midnight the night before surgery.  No gum chewing, lozengers or hard candies.  You may however, drink CLEAR liquids up to 2 hours before you are scheduled to arrive for your surgery. Do not drink anything within 2 hours of your scheduled arrival time.  Clear liquids include: - water  - apple juice without pulp - gatorade (not RED, PURPLE, OR BLUE) - black coffee or tea (Do NOT add milk or creamers to the coffee or tea) Do NOT drink anything that is not on this list.  Type 1 and Type 2 diabetics should only drink water.  In addition, your doctor has ordered for you to drink the provided  Ensure Pre-Surgery Clear Carbohydrate Drink  Gatorade G2 Drinking this carbohydrate drink up to two hours before surgery helps to reduce insulin resistance and improve patient outcomes. Please complete drinking 2 hours prior to scheduled arrival time.  TAKE THESE MEDICATIONS THE MORNING OF SURGERY WITH A SIP OF WATER:  (take one the night before and one on the morning of surgery - helps to prevent nausea after surgery.)  Use inhalers on the day of surgery and bring to the hospital.  Stop Metformin 2 days prior to surgery.  Follow recommendations from Cardiologist, Pulmonologist or PCP regarding stopping Aspirin, Coumadin, Plavix, Eliquis, Pradaxa, or Pletal.  One week prior to surgery: Stop Anti-inflammatories (NSAIDS) such as Advil, Aleve, Ibuprofen, Motrin, Naproxen, Naprosyn and Aspirin based products such as Excedrin, Goodys Powder, BC Powder. You may however, continue to  take Tylenol if needed for pain up until the day of surgery. Stop ANY OVER THE COUNTER vitamins and supplements until after surgery.   No Alcohol for 24 hours before or after surgery.  No Smoking including e-cigarettes for 24 hours prior to surgery.  No chewable tobacco products for at least 6 hours prior to surgery.  No nicotine patches on the day of surgery.  Do not use any "recreational" drugs for at least a week prior to your surgery.  Please be advised that the combination of cocaine and anesthesia may have negative outcomes, up to and including death. If you test positive for cocaine, your surgery will be cancelled.  On the morning of surgery brush your teeth with toothpaste and water, you may rinse your mouth with mouthwash if you wish. Do not swallow any toothpaste or mouthwash.  Do not wear lotions, powders, or perfumes.   Do not shave body from the neck down 48 hours prior to surgery just in case you cut yourself which could leave a site for infection.  Also, freshly shaved skin may become irritated if using the CHG soap.  Contact lenses, hearing aids and dentures may not be worn into surgery.  Do not bring valuables to the hospital. [facility] is not responsible for any missing/lost belongings or valuables.   Use CHG Soap or wipes as directed on instruction sheet.   Notify your doctor if there is any change in your medical condition (cold, fever, infection).  Wear comfortable clothing (specific to your surgery type) to the hospital.  Plan for  stool softeners for home use; pain medications have a tendency to cause constipation. You can also help prevent constipation by eating foods high in fiber such as fruits and vegetables and drinking plenty of fluids as your diet allows.  After surgery, you can help prevent lung complications by doing breathing exercises.  Take deep breaths and cough every 1-2 hours. Your doctor may order a device called an Incentive Spirometer to  help you take deep breaths. When coughing or sneezing, hold a pillow firmly against your incision with both hands. This is called "splinting." Doing this helps protect your incision. It also decreases belly discomfort.  If you are being admitted to the hospital overnight, leave your suitcase in the car. After surgery it may be brought to your room.  If you are being discharged the day of surgery, you will not be allowed to drive home. You will need a responsible adult (18 years or older) to drive you home and stay with you that night.   If you are taking public transportation, you will need to have a responsible adult (18 years or older) with you. Please confirm with your physician that it is acceptable to use public transportation.   Please call the [place] Dept. at [phone] if you have any questions about these instructions.  Surgery Visitation Policy:  Patients undergoing a surgery or procedure may have one family member or support person with them as long as that person is not COVID-19 positive or experiencing its symptoms.  That person may remain in the waiting area during the procedure.  Inpatient Visitation:    Visiting hours are 7 a.m. to 8 p.m. Inpatients will be allowed two visitors daily. The visitors may change each day during the patient's stay. No visitors under the age of 4. Any visitor under the age of 28 must be accompanied by an adult. The visitor must pass COVID-19 screenings, use hand sanitizer when entering and exiting the patient's room and wear a mask at all times, including in the patient's room. Patients must also wear a mask when staff or their visitor are in the room. Masking is required regardless of vaccination status.

## 2021-05-10 ENCOUNTER — Encounter: Payer: Self-pay | Admitting: General Surgery

## 2021-05-10 ENCOUNTER — Other Ambulatory Visit: Payer: Self-pay

## 2021-05-10 ENCOUNTER — Encounter
Admission: RE | Admit: 2021-05-10 | Discharge: 2021-05-10 | Disposition: A | Discharge status: Home / Self Care | Payer: Medicare Other | Source: Ambulatory Visit | Attending: General Surgery | Admitting: General Surgery

## 2021-05-10 HISTORY — DX: Pneumonia, unspecified organism: J18.9

## 2021-05-10 NOTE — Patient Instructions (Addendum)
Your procedure is scheduled on: May 14, 2021 MONDAY Report to the Registration Desk on the 1st floor of the [facility]. To find out your arrival time, please call [phone] between 1PM - 3PM on: Friday May 11, 2021  REMEMBER: Instructions that are not followed completely may result in serious medical risk, up to and including death; or upon the discretion of your surgeon and anesthesiologist your surgery may need to be rescheduled.  DO NOT EAT OR DRINK after midnight the night before surgery.  No gum chewing, lozengers or hard candies.   TAKE THESE MEDICATIONS THE MORNING OF SURGERY WITH A SIP OF WATER: ZETIA METOPROLOL LEVOTHYROXINE EFFEXOR TAMSULOSIN XIFAXAN OMEPRAZOLE (take one the night before and one on the morning of surgery - helps to prevent nausea after surgery.)  Use inhalers on the day of surgery   Stop Metformin  2 days prior to surgery. LAST DOSE 05-11-2021 Friday   One week prior to surgery: Stop Anti-inflammatories (NSAIDS) such as Advil, Aleve, Ibuprofen, Motrin, Naproxen, Naprosyn and  ASPIRIN OR Aspirin based products such as Excedrin, Goodys Powder, BC Powder. Stop ANY OVER THE COUNTER supplements until after surgery. You may however, continue to take Tylenol if needed for pain up until the day of surgery.  No Alcohol for 24 hours before or after surgery.  No Smoking including e-cigarettes for 24 hours prior to surgery.  No chewable tobacco products for at least 6 hours prior to surgery.  No nicotine patches on the day of surgery.  Do not use any "recreational" drugs for at least a week prior to your surgery.  Please be advised that the combination of cocaine and anesthesia may have negative outcomes, up to and including death. If you test positive for cocaine, your surgery will be cancelled.  On the morning of surgery brush your teeth with toothpaste and water, you may rinse your mouth with mouthwash if you wish. Do not swallow any toothpaste or  mouthwash.  Do not wear jewelry, make-up, hairpins, clips or nail polish.  Do not wear lotions, powders, or perfumes OR DEODORANT   Do not shave body from the neck down 48 hours prior to surgery just in case you cut yourself which could leave a site for infection.  Also, freshly shaved skin may become irritated if using the CHG soap.  Contact lenses, hearing aids and dentures may not be worn into surgery.  Do not bring valuables to the hospital. [facility] is not responsible for any missing/lost belongings or valuables.   Use CHG Soap as directed on instruction sheet.  Notify your doctor if there is any change in your medical condition (cold, fever, infection).  Wear comfortable clothing (specific to your surgery type) to the hospital.  Plan for stool softeners for home use; pain medications have a tendency to cause constipation. You can also help prevent constipation by eating foods high in fiber such as fruits and vegetables and drinking plenty of fluids as your diet allows.  After surgery, you can help prevent lung complications by doing breathing exercises.  Take deep breaths and cough every 1-2 hours. Your doctor may order a device called an Incentive Spirometer to help you take deep breaths. When coughing or sneezing, hold a pillow firmly against your incision with both hands. This is called "splinting." Doing this helps protect your incision. It also decreases belly discomfort.  If you are being discharged the day of surgery, you will not be allowed to drive home. You will need a  responsible adult (18 years or older) to drive you home and stay with you that night.   If you are taking public transportation, you will need to have a responsible adult (18 years or older) with you. Please confirm with your physician that it is acceptable to use public transportation.   Please call the [facility] at [phone] if you have any questions about these  instructions.  Surgery Visitation Policy:  Patients undergoing a surgery or procedure may have one family member or support person with them as long as that person is not COVID-19 positive or experiencing its symptoms.  That person may remain in the waiting area during the procedure.  Inpatient Visitation:    Visiting hours are 7 a.m. to 8 p.m. Inpatients will be allowed two visitors daily. The visitors may change each day during the patient's stay. No visitors under the age of 64. Any visitor under the age of 82 must be accompanied by an adult. The visitor must pass COVID-19 screenings, use hand sanitizer when entering and exiting the patient's room and wear a mask at all times, including in the patient's room. Patients must also wear a mask when staff or their visitor are in the room. Masking is required regardless of vaccination status.  Total Hip/Knee Replacement Preoperative Educational Video  To better prepare for surgery, please view our videos that explain the physical activity and discharge planning required to have the best surgical recovery at [facility]  [email or web address]      Questions? Call [phone] or email [email or web address]

## 2021-05-11 ENCOUNTER — Encounter
Admission: RE | Admit: 2021-05-11 | Discharge: 2021-05-11 | Disposition: A | Discharge status: Home / Self Care | Payer: Medicare Other | Source: Ambulatory Visit | Attending: General Surgery | Admitting: General Surgery

## 2021-05-11 ENCOUNTER — Encounter: Payer: Self-pay | Admitting: General Surgery

## 2021-05-11 DIAGNOSIS — Z01812 Encounter for preprocedural laboratory examination: Secondary | ICD-10-CM | POA: Diagnosis present

## 2021-05-11 HISTORY — DX: Other specified health status: Z78.9

## 2021-05-11 LAB — CBC
HCT: 36.9 % — ABNORMAL LOW (ref 39.0–52.0)
Hemoglobin: 12.4 g/dL — ABNORMAL LOW (ref 13.0–17.0)
MCH: 29.3 pg (ref 26.0–34.0)
MCHC: 33.6 g/dL (ref 30.0–36.0)
MCV: 87.2 fL (ref 80.0–100.0)
Platelets: 159 10*3/uL (ref 150–400)
RBC: 4.23 MIL/uL (ref 4.22–5.81)
RDW: 12.9 % (ref 11.5–15.5)
WBC: 4.2 10*3/uL (ref 4.0–10.5)
nRBC: 0 % (ref 0.0–0.2)

## 2021-05-11 LAB — PROTIME-INR
INR: 1 (ref 0.8–1.2)
Prothrombin Time: 13.2 seconds (ref 11.4–15.2)

## 2021-05-11 LAB — COMPREHENSIVE METABOLIC PANEL
ALT: 51 U/L — ABNORMAL HIGH (ref 0–44)
AST: 46 U/L — ABNORMAL HIGH (ref 15–41)
Albumin: 4 g/dL (ref 3.5–5.0)
Alkaline Phosphatase: 116 U/L (ref 38–126)
Anion gap: 10 (ref 5–15)
BUN: 8 mg/dL (ref 8–23)
CO2: 25 mmol/L (ref 22–32)
Calcium: 9.1 mg/dL (ref 8.9–10.3)
Chloride: 102 mmol/L (ref 98–111)
Creatinine, Ser: 0.81 mg/dL (ref 0.61–1.24)
GFR, Estimated: >60 mL/min (ref >60)
Glucose, Bld: 134 mg/dL — ABNORMAL HIGH (ref 70–99)
Potassium: 4 mmol/L (ref 3.5–5.1)
Sodium: 137 mmol/L (ref 135–145)
Total Bilirubin: 0.7 mg/dL (ref 0.3–1.2)
Total Protein: 7.6 g/dL (ref 6.5–8.1)

## 2021-05-11 LAB — APTT: aPTT: 30 seconds (ref 24–36)

## 2021-05-11 NOTE — Consult Note (Signed)
Perioperative Services  Pre-Admission/Anesthesia Testing Clinical Consult  Date: 05/11/21  Patient Demographics:  Name: Ray Robles DOB:   02/26/1961 MRN:   [id number]  Planned Surgical Procedure(s):    Case: [id number] Date/Time: 05/14/21 0715   Procedure: XI ROBOTIC ASSISTED INGUINAL HERNIA REPAIR WITH MESH (Bilateral)   Anesthesia type: General   Pre-op diagnosis: bilateral inguinal hernia   Location: [facility] OR ROOM 06 / [place] ORS FOR ANESTHESIA GROUP   Surgeons: Fredirick Maudlin, MD     NOTE: Available PAT nursing documentation and vital signs have been reviewed. Clinical nursing staff has updated patient's PMH/PSHx, current medication list, and drug allergies/intolerances to ensure comprehensive history available to assist in medical decision making as it pertains to the aforementioned surgical procedure and anticipated anesthetic course. Extensive review of available clinical information performed. [place] PMH and PSHx updated with any diagnoses/procedures that  may have been inadvertently omitted during his intake with the pre-admission testing department's nursing staff.  Clinical Discussion:  Ray Robles is a 60 y.o. male who is submitted for pre-surgical anesthesia review and clearance prior to him undergoing the above procedure. Patient has never been a smoker. Pertinent PMH includes: cardiac murmur, cirrhosis, HTN, T2DM, hypothyroidism, GERD, hiatal hernia, PUD, HCV (s/p successful Tx), OSAH (noncompliant with nocturnal PAP therapy), DOE, hepatic encephalopathy, splenomegaly, OA, anxiety, depression, PTSD, opioid dependence disorder (in remission), chronic pain syndrome.  Patient with a PMH significant for inguinal hernia.  Patient underwent repairs of a RIGHT inguinal hernia and 2013 and again in 2018 for recurrence.  Towards end of May, patient began to notice a "bulge" in the RIGHT side of his groin, which prompted a visit to his PCP [name], MD).  PCP diagnosed patient  with yet another inguinal hernia recurrence and referred patient to general surgery for consideration of repair.  Patient met with Dr. Fredirick Maudlin (general surgery) on 05/01/2021 to discuss surgical options for hernia repair.  On exam, Dr. [name] appreciated BILATERAL inguinal hernias (R >L).  Risk versus benefits of surgical repair were discussed.  Of note, both the patient's previous hernia repairs were open procedures.  Dr. [name] recommended robot-assisted laparoscopic repair at this time. Patient with a past medical history significant for decompensated hepatic cirrhosis and other various comorbidities. Anesthesia consult was requested by primary attending surgeon.   Patient is under the care of [facility] hepatology clinic [name], FNP).  Unable to view records in [place]; records requested for review via fax.  PCP had made previous request for records and last office note from 10/2019 was received.  Note was reviewed by PAT APP.  Patient with decompensated cirrhosis (ascites, encephalopathy, upper GI bleed/varices) secondary to HCV diagnosis.  Patient was treated for treatment naive genotype 3 HCV with Epclusa and ribavirin for total of 12 weeks resulting in total cure.  At the time of his last office visit with GI, patient denied continued ETOH use.  Patient complained of fatigue, however patient not using prescribed nocturnal PAP therapy in the setting of known OSAH diagnosis.  No encephalopathy symptoms on prescribed daily rifaximin and lactulose.  Patient denied any abdominal pain, jaundice, pruritus, shortness of breath, chest pain, hematochezia, melena, or changes to his bowel habits. (+) intention weight loss of approximately 25 pounds reported. No appreciable ascites on exam.  Child Pugh A (score 5). MELD-Na score 8.  Patient not an ideal OLT candidate due to low meld score, psychiatric history, and history of substance abuse.  No changes were made to patient's medication regimen.  Patient to  follow-up with gastroenterology/hepatology in 6 months or sooner if needed.  Last documented blood pressure elevated at 169/91 despite currently prescribed diuretic and beta-blocker therapies.  In review of his past medical record, blood pressure elevations felt to be secondary to patient's pain which he rated 8/10 at that time.  Patient is on ezetimibe for his HLD. Patient takes buprenorphine-naloxone daily for pain and continue treatment of his opioid dependence (in remission).  T2DM well controlled on currently prescribed regimen; last Hgb A1c 5.9% when checked on 02/16/2021.  Patient is scheduled for elective BILATERAL inguinal hernia repairs on 05/14/2021 with Dr. Fredirick Maudlin.  Given patient's past medical history significant for the advanced hepatic disease, presurgical cardiac clearance was sought from patient's gastroenterology/hepatology provider.  Per [name], FNP-C, "the biggest concerns which need to be prioritized with surgery or avoidance of fluid overload, oversedation, and infection prevention. Patient may proceed as planned with an overall ACCEPTABLE risk of significant perioperative complications". This patient is not on any type of daily anticoagulation/antiplatelet therapies.   Patient denies previous perioperative complications with anesthesia in the past. In review of the available records, it is noted that patient underwent a general anesthetic course here (ASA III) in 10/2017 without documented complications.   Vitals with BMI 05/10/2021 05/01/2021 04/27/2021  Height $Remov'5\' 10"'FgDgKV$  5' 10.5" 5' 10.5"  Weight 190 lbs 199 lbs 10 oz 198 lbs  BMI 83.66 29.47 28  Systolic - 654 650  Diastolic - 91 93  Pulse - 94 92  Some encounter information is confidential and restricted. Go to Review Flowsheets activity to see all data.    Providers/Specialists:   NOTE: Primary physician provider listed below. Patient may have been seen by APP or partner within same practice.   PROVIDER ROLE /  SPECIALTY LAST Lucy Antigua, MD  General Surgery 05/01/2021  Birdie Sons, MD  Primary Care Provider 04/27/2021  [name], FNP-C  Gastroenterology / Hepatology 10/04/2019   Allergies:  Ambien [zolpidem tartrate], Ibuprofen, Morphine, Tylenol [acetaminophen], Xanax [alprazolam], and Zaleplon  Current Home Medications:    ipratropium-albuterol (DUONEB) 0.5-2.5 (3) MG/3ML nebulizer solution 3 mL    albuterol (VENTOLIN HFA) 108 (90 Base) MCG/ACT inhaler   aspirin-acetaminophen-caffeine (EXCEDRIN MIGRAINE) 250-250-65 MG tablet   buprenorphine-naloxone (SUBOXONE) 2-0.5 mg SUBL SL tablet   diphenoxylate-atropine (LOMOTIL) 2.5-0.025 MG tablet   ezetimibe (ZETIA) 10 MG tablet   furosemide (LASIX) 20 MG tablet   lactulose (CHRONULAC) 10 GM/15ML solution   levothyroxine (SYNTHROID) 88 MCG tablet   Melatonin 10 MG CAPS   metFORMIN (GLUCOPHAGE-XR) 500 MG 24 hr tablet   metoprolol tartrate (LOPRESSOR) 25 MG tablet   metroNIDAZOLE (METROCREAM) 0.75 % cream   mirtazapine (REMERON) 15 MG tablet   naloxone (NARCAN) nasal spray 4 mg/0.1 mL   nitroGLYCERIN (NITROSTAT) 0.4 MG SL tablet   nystatin cream (MYCOSTATIN)   omeprazole (PRILOSEC) 40 MG capsule   ondansetron (ZOFRAN-ODT) 4 MG disintegrating tablet   prazosin (MINIPRESS) 5 MG capsule   QUEtiapine (SEROQUEL) 300 MG tablet   rifaximin (XIFAXAN) 550 MG TABS tablet   spironolactone (ALDACTONE) 100 MG tablet   tamsulosin (FLOMAX) 0.4 MG CAPS capsule   triamcinolone cream (KENALOG) 0.1 %   venlafaxine XR (EFFEXOR-XR) 150 MG 24 hr capsule   venlafaxine XR (EFFEXOR-XR) 75 MG 24 hr capsule   Vitamin D, Ergocalciferol, (DRISDOL) 1.25 MG (50000 UNIT) CAPS capsule   Accu-Chek Softclix Lancets lancets   glucose blood (ACCU-CHEK GUIDE) test strip   Lancets Misc. (ACCU-CHEK SOFTCLIX  LANCET DEV) KIT   levothyroxine (SYNTHROID) 75 MCG tablet   OXYGEN   History:   Past Medical History:  Diagnosis Date   Anxiety    Arthritis     Chronic pain disorder    on buprenorphrine-naloxone   Cirrhosis ([place])    a.) 10/04/2019 - Child Pugh A: 5 and MELD-Na score: 8   Depression    Dysrhythmia    Exertional dyspnea    GERD (gastroesophageal reflux disease)    food sticks in throat has esophagus stretched   H/O bacterial pneumonia 11/11/2007   2008 and 2015    Heart murmur    Hepatic encephalopathy (HCC)    takes daily lactulose and rifaximin   Hepatitis C    treatment naive genotype 3; Tx'd with 12 weeks Epclusa and Ribavirin   Hiatal hernia    History of substance abuse ([place])    in remission; uses buprenorphrine-naloxone   Hx of endoscopy    05/12/2014- [facility]. Dr Candace Cruise. Benign appearing stricture. Dilated the examination was otherwise normal.02/13/2012- Beniign appearing intrinsic mild stenosis at GE junction, successfully dialated   Hypertension    Hypothyroidism    On supplemental oxygen by nasal cannula    2L PRN   Opioid dependence in early, early partial, sustained full, or sustained partial remission (HCC)    Peripheral neuropathy    Pneumonia    PTSD (post-traumatic stress disorder)    PUD (peptic ulcer disease)    Renal cyst, right    Sleep apnea    compliance issues with prescribed nocturnal BiPAP therapy   Splenomegaly    a.)  14.6 cm on 09/06/2019 MRI   T2DM (type 2 diabetes mellitus) ([place])    Past Surgical History:  Procedure Laterality Date   COLONOSCOPY WITH PROPOFOL N/A 05/12/2015   Procedure: COLONOSCOPY WITH PROPOFOL;  Surgeon: Hulen Luster, MD;  [facility] END[facility]COPY;  Service: Gastroenterology;  Laterality: N/A;   edg     HERNIA REPAIR  08/2012   Dr. Jamal Collin; [place] Right 10/28/2017   Procedure: HERNIA REPAIR INGUINAL ADULT;  Surgeon: Leonie Green, MD;  Location: [facility] ORS;  Service: General;  Laterality: Right;   Family History  Problem Relation Age of Onset   Cancer Mother        Throat, lung, skin   COPD Mother    Arthritis Mother    Diabetes Father     Arthritis Father    Cancer Father        Bladder   Alcohol abuse Brother    Diabetes Brother    Alcohol abuse Brother    Drug abuse Brother    Cancer Paternal Grandfather    Cancer Paternal Uncle    Social History   Tobacco Use   Smoking status: Never   Smokeless tobacco: Never  Vaping Use   Vaping Use: Never used  Substance Use Topics   Alcohol use: No    Alcohol/week: 0.0 standard drinks   Drug use: Not Currently    Types: Oxycodone    Pertinent Clinical Results:  LABS: Labs reviewed: Acceptable for surgery.  Hospital Outpatient Visit on 05/11/2021  Component Date Value Ref Range Status   Sodium 05/11/2021 137  135 - 145 mmol/L Final   Potassium 05/11/2021 4.0  3.5 - 5.1 mmol/L Final   Chloride 05/11/2021 102  98 - 111 mmol/L Final   CO2 05/11/2021 25  22 - 32 mmol/L Final   Glucose, Bld 05/11/2021 134 (A) 70 - 99  mg/dL Final   Glucose reference range applies only to samples taken after fasting for at least 8 hours.   BUN 05/11/2021 8  8 - 23 mg/dL Final   Creatinine, Ser 05/11/2021 0.81  0.61 - 1.24 mg/dL Final   Calcium 05/11/2021 9.1  8.9 - 10.3 mg/dL Final   Total Protein 05/11/2021 7.6  6.5 - 8.1 g/dL Final   Albumin 05/11/2021 4.0  3.5 - 5.0 g/dL Final   AST 05/11/2021 46 (A) 15 - 41 U/L Final   ALT 05/11/2021 51 (A) 0 - 44 U/L Final   Alkaline Phosphatase 05/11/2021 116  38 - 126 U/L Final   Total Bilirubin 05/11/2021 0.7  0.3 - 1.2 mg/dL Final   GFR, Estimated 05/11/2021 >60  >60 mL/min Final   Comment: (NOTE) Calculated using the CKD-EPI Creatinine Equation (2021)    Anion gap 05/11/2021 10  5 - 15 Final   Performed at [facility], [place], [place] [address]   WBC 05/11/2021 4.2  4.0 - 10.5 K/uL Final   RBC 05/11/2021 4.23  4.22 - 5.81 MIL/uL Final   Hemoglobin 05/11/2021 12.4 (A) 13.0 - 17.0 g/dL Final   HCT 05/11/2021 36.9 (A) 39.0 - 52.0 % Final   MCV 05/11/2021 87.2  80.0 - 100.0 fL Final   MCH 05/11/2021 29.3  26.0 -  34.0 pg Final   MCHC 05/11/2021 33.6  30.0 - 36.0 g/dL Final   RDW 05/11/2021 12.9  11.5 - 15.5 % Final   Platelets 05/11/2021 159  150 - 400 K/uL Final   nRBC 05/11/2021 0.0  0.0 - 0.2 % Final   Performed at [facility], [address] [place], [place] [address]   Prothrombin Time 05/11/2021 13.2  11.4 - 15.2 seconds Final   INR 05/11/2021 1.0  0.8 - 1.2 Final   Comment: (NOTE) INR goal varies based on device and disease states. Performed at [facility], [address] [place], [place] [address]    aPTT 05/11/2021 30  24 - 36 seconds Final   Performed at [facility], [address] [place],  [address]    ECG: Date: l11/26/2021 Time ECG obtained: 1751 PM Rate: 100 bpm Rhythm: normal sinus Axis (leads I and aVF): Normal Intervals: PR 168 ms. QRS 80 ms. QTc 469 ms. ST segment and T wave changes: No evidence of acute ST segment elevation or depression Comparison: Similar to previous tracing obtained on 05/21/2019   IMAGING / PROCEDURES: MRI ABDOMEN WITH AND WITHOUT contrast performed on 09/06/2019 Cirrhosis and sequela of portal hypertension No evidence of HCC Interval resolution of acute uncompensated pancreatitis Splenomegaly at 14.6 cm craniocaudally.  Small splenule Stable simple right renal cyst.  No enhancing renal mass or hydronephrosis.  Small hiatal hernia. Colonic diverticulosis.  No acute inflammatory process.  No ascites.  Impression and Plan:  KARMA HINEY has been referred for pre-anesthesia review and clearance prior to him undergoing the planned anesthetic and procedural courses. Available labs, pertinent testing, and imaging results were personally reviewed by me.  Based on today's labs the following risk stratifications were calculated:  Child Pugh Class  Total bilirubin 0.7 mg-dL Albumin 4.0 g/dL INR 1.0 No ascites No encephalopathy TOTAL: 5 - Class A - abdominal surgery perioperative mortality: 10%  MELD-Na  score No hemodialysis Creatinine 0.81 mg/dL Total bilirubin 0.7 mg/dL INR 1.0 Sodium 137 mmol/L TOTAL: 9 - <2% estimated 90-day mortality   VOCAL-Penn score Age: 72 Albumin 4.0 g/dL Total  bilirubin 0.7 mg/dL Platelet count 159 K/uL BMI >30: No (last documented BMI 27.26 kg/m) NAFLD: No ASA score: III Emergency procedure: No Surgery type: abdominal wall TOTAL: 30/90/180-day mortality rates 0.5% / 0.9% / 2.1%; 90-day decompensation rate 4.1%  This patient has been appropriately cleared by gastroenterology/hepatology with an overall ACCEPTABLE risk of significant perioperative cardiovascular complications. Based on clinical review performed today (05/11/21), barring any significant acute changes in the patient's overall condition, it is anticipated that he will be able to proceed with the planned surgical intervention. Any acute changes in clinical condition may necessitate his procedure being postponed and/or cancelled. Patient will meet with anesthesia team (MD and/or CRNA) on the day of his procedure for preoperative evaluation/assessment. Questions regarding anesthetic course will be fielded at that time.   Pre-surgical instructions were reviewed with the patient during his PAT appointment and questions were fielded by PAT clinical staff. Patient was advised that if any questions or concerns arise prior to his procedure then he should return a call to PAT and/or his surgeon's office to discuss.  Honor Loh, MSN, APRN, FNP-C, CEN [facility]  Peri-operative Services Nurse Practitioner Phone: [phone] 05/11/21 5:30 PM  NOTE: This note has been prepared using Dragon dictation software. Despite my best ability to proofread, there is always the potential that unintentional transcriptional errors may still occur from this process.

## 2021-05-13 MED ORDER — CEFAZOLIN SODIUM-DEXTROSE 2-4 GM/100ML-% IV SOLN
2.0000 g | INTRAVENOUS | Status: AC
  Administered 2021-05-14 (×2): 2 g via INTRAVENOUS

## 2021-05-13 MED ORDER — CHLORHEXIDINE GLUCONATE CLOTH 2 % EX PADS
6.0000 | MEDICATED_PAD | Freq: Once | CUTANEOUS | Status: AC
  Administered 2021-05-14: 6 via TOPICAL

## 2021-05-13 MED ORDER — CHLORHEXIDINE GLUCONATE CLOTH 2 % EX PADS
6.0000 | MEDICATED_PAD | Freq: Once | CUTANEOUS | Status: AC
Start: 2021-05-13 — End: 2021-05-14
  Administered 2021-05-14: 6 via TOPICAL

## 2021-05-13 MED ORDER — SODIUM CHLORIDE 0.9 % IV SOLN: INTRAVENOUS | Status: DC

## 2021-05-14 ENCOUNTER — Ambulatory Visit: Payer: Medicare Other | Admitting: Urgent Care

## 2021-05-14 ENCOUNTER — Ambulatory Visit
Admission: RE | Admit: 2021-05-14 | Discharge: 2021-05-14 | Disposition: A | Discharge status: Home / Self Care | Payer: Medicare Other | Attending: General Surgery | Admitting: General Surgery

## 2021-05-14 ENCOUNTER — Encounter: Payer: Self-pay | Admitting: General Surgery

## 2021-05-14 ENCOUNTER — Encounter
Admission: RE | Disposition: A | Discharge status: Home / Self Care | Payer: Self-pay | Source: Home / Self Care | Attending: General Surgery

## 2021-05-14 ENCOUNTER — Other Ambulatory Visit: Payer: Self-pay

## 2021-05-14 DIAGNOSIS — K4021 Bilateral inguinal hernia, without obstruction or gangrene, recurrent: Secondary | ICD-10-CM

## 2021-05-14 DIAGNOSIS — Z885 Allergy status to narcotic agent status: Secondary | ICD-10-CM | POA: Diagnosis not present

## 2021-05-14 DIAGNOSIS — Z888 Allergy status to other drugs, medicaments and biological substances status: Secondary | ICD-10-CM | POA: Diagnosis not present

## 2021-05-14 DIAGNOSIS — Z79899 Other long term (current) drug therapy: Secondary | ICD-10-CM | POA: Insufficient documentation

## 2021-05-14 DIAGNOSIS — K55069 Acute infarction of intestine, part and extent unspecified: Secondary | ICD-10-CM | POA: Diagnosis not present

## 2021-05-14 DIAGNOSIS — K766 Portal hypertension: Secondary | ICD-10-CM | POA: Insufficient documentation

## 2021-05-14 DIAGNOSIS — K4031 Unilateral inguinal hernia, with obstruction, without gangrene, recurrent: Secondary | ICD-10-CM | POA: Diagnosis present

## 2021-05-14 DIAGNOSIS — K4090 Unilateral inguinal hernia, without obstruction or gangrene, not specified as recurrent: Secondary | ICD-10-CM | POA: Diagnosis not present

## 2021-05-14 DIAGNOSIS — K660 Peritoneal adhesions (postprocedural) (postinfection): Secondary | ICD-10-CM | POA: Insufficient documentation

## 2021-05-14 DIAGNOSIS — Z7984 Long term (current) use of oral hypoglycemic drugs: Secondary | ICD-10-CM | POA: Diagnosis not present

## 2021-05-14 DIAGNOSIS — Z886 Allergy status to analgesic agent status: Secondary | ICD-10-CM | POA: Insufficient documentation

## 2021-05-14 DIAGNOSIS — K7460 Unspecified cirrhosis of liver: Secondary | ICD-10-CM | POA: Insufficient documentation

## 2021-05-14 HISTORY — DX: Polyneuropathy, unspecified: G62.9

## 2021-05-14 HISTORY — DX: Chronic pain syndrome: G89.4

## 2021-05-14 HISTORY — DX: Type 2 diabetes mellitus without complications: E11.9

## 2021-05-14 HISTORY — DX: Peptic ulcer, site unspecified, unspecified as acute or chronic, without hemorrhage or perforation: K27.9

## 2021-05-14 HISTORY — DX: Splenomegaly, not elsewhere classified: R16.1

## 2021-05-14 HISTORY — DX: Opioid dependence, in remission: F11.21

## 2021-05-14 HISTORY — PX: XI ROBOTIC ASSISTED INGUINAL HERNIA REPAIR WITH MESH: SHX6706

## 2021-05-14 HISTORY — DX: Other forms of dyspnea: R06.09

## 2021-05-14 HISTORY — DX: Other psychoactive substance abuse, in remission: F19.11

## 2021-05-14 HISTORY — DX: Cyst of kidney, acquired: N28.1

## 2021-05-14 HISTORY — DX: Diaphragmatic hernia without obstruction or gangrene: K44.9

## 2021-05-14 LAB — GLUCOSE, CAPILLARY
Glucose-Capillary: 138 mg/dL — ABNORMAL HIGH (ref 70–99)
Glucose-Capillary: 229 mg/dL — ABNORMAL HIGH (ref 70–99)

## 2021-05-14 SURGERY — REPAIR, HERNIA, INGUINAL, ROBOT-ASSISTED, LAPAROSCOPIC, USING MESH
Anesthesia: General | Laterality: Bilateral

## 2021-05-14 MED ORDER — FENTANYL CITRATE (PF) 100 MCG/2ML IJ SOLN
INTRAMUSCULAR | Status: AC
  Filled 2021-05-14: qty 2

## 2021-05-14 MED ORDER — LIDOCAINE HCL (CARDIAC) PF 100 MG/5ML IV SOSY
PREFILLED_SYRINGE | INTRAVENOUS | Status: DC | PRN
  Administered 2021-05-14: 100 mg via INTRAVENOUS

## 2021-05-14 MED ORDER — FENTANYL CITRATE (PF) 100 MCG/2ML IJ SOLN
INTRAMUSCULAR | Status: DC | PRN
  Administered 2021-05-14 (×4): 50 ug via INTRAVENOUS

## 2021-05-14 MED ORDER — FENTANYL CITRATE (PF) 100 MCG/2ML IJ SOLN
INTRAMUSCULAR | Status: AC
  Administered 2021-05-14: 50 ug via INTRAVENOUS
  Filled 2021-05-14: qty 2

## 2021-05-14 MED ORDER — LACTATED RINGERS IV SOLN: INTRAVENOUS | Status: DC | PRN

## 2021-05-14 MED ORDER — MIDAZOLAM HCL 2 MG/2ML IJ SOLN
INTRAMUSCULAR | Status: DC | PRN
  Administered 2021-05-14: 2 mg via INTRAVENOUS

## 2021-05-14 MED ORDER — VISTASEAL 10 ML SINGLE DOSE KIT
PACK | CUTANEOUS | Status: DC | PRN
  Administered 2021-05-14: 10 mL via TOPICAL

## 2021-05-14 MED ORDER — METOPROLOL TARTRATE 25 MG PO TABS
ORAL_TABLET | ORAL | Status: AC
  Filled 2021-05-14: qty 1

## 2021-05-14 MED ORDER — ROCURONIUM BROMIDE 100 MG/10ML IV SOLN
INTRAVENOUS | Status: DC | PRN
  Administered 2021-05-14 (×2): 20 mg via INTRAVENOUS
  Administered 2021-05-14: 50 mg via INTRAVENOUS
  Administered 2021-05-14: 20 mg via INTRAVENOUS

## 2021-05-14 MED ORDER — PROPOFOL 10 MG/ML IV BOLUS
INTRAVENOUS | Status: DC | PRN
  Administered 2021-05-14: 150 mg via INTRAVENOUS

## 2021-05-14 MED ORDER — PHENYLEPHRINE HCL (PRESSORS) 10 MG/ML IV SOLN
INTRAVENOUS | Status: DC | PRN
  Administered 2021-05-14 (×3): 200 ug via INTRAVENOUS

## 2021-05-14 MED ORDER — MIDAZOLAM HCL 2 MG/2ML IJ SOLN
INTRAMUSCULAR | Status: AC
  Filled 2021-05-14: qty 2

## 2021-05-14 MED ORDER — PROPOFOL 10 MG/ML IV BOLUS
INTRAVENOUS | Status: AC
  Filled 2021-05-14: qty 20

## 2021-05-14 MED ORDER — ACETAMINOPHEN 10 MG/ML IV SOLN
INTRAVENOUS | Status: DC | PRN
  Administered 2021-05-14: 1000 mg via INTRAVENOUS

## 2021-05-14 MED ORDER — LIDOCAINE-EPINEPHRINE 1 %-1:100000 IJ SOLN
INTRAMUSCULAR | Status: DC | PRN
  Administered 2021-05-14: 9 mL via INTRAMUSCULAR
  Administered 2021-05-14: 2 mL via INTRAMUSCULAR

## 2021-05-14 MED ORDER — FENTANYL CITRATE (PF) 100 MCG/2ML IJ SOLN
25.0000 ug | INTRAMUSCULAR | Status: DC | PRN
  Administered 2021-05-14 (×2): 50 ug via INTRAVENOUS

## 2021-05-14 MED ORDER — SUGAMMADEX SODIUM 200 MG/2ML IV SOLN
INTRAVENOUS | Status: DC | PRN
  Administered 2021-05-14: 200 mg via INTRAVENOUS

## 2021-05-14 MED ORDER — OXYCODONE HCL ER 10 MG PO T12A
EXTENDED_RELEASE_TABLET | ORAL | Status: AC
  Administered 2021-05-14: 10 mg
  Filled 2021-05-14: qty 1

## 2021-05-14 MED ORDER — CHLORHEXIDINE GLUCONATE 0.12 % MT SOLN
OROMUCOSAL | Status: AC
  Administered 2021-05-14: 15 mL via OROMUCOSAL
  Filled 2021-05-14: qty 15

## 2021-05-14 MED ORDER — METOPROLOL TARTRATE 25 MG PO TABS
25.0000 mg | ORAL_TABLET | Freq: Once | ORAL | Status: AC
  Administered 2021-05-14: 25 mg via ORAL

## 2021-05-14 MED ORDER — LIDOCAINE-EPINEPHRINE 1 %-1:100000 IJ SOLN
INTRAMUSCULAR | Status: AC
  Filled 2021-05-14: qty 1

## 2021-05-14 MED ORDER — HYDROMORPHONE HCL 1 MG/ML IJ SOLN
INTRAMUSCULAR | Status: AC
  Filled 2021-05-14: qty 1

## 2021-05-14 MED ORDER — CHLORHEXIDINE GLUCONATE 0.12 % MT SOLN: 15.0000 mL | Freq: Once | OROMUCOSAL | Status: AC

## 2021-05-14 MED ORDER — ONDANSETRON HCL 4 MG/2ML IJ SOLN
INTRAMUSCULAR | Status: AC
  Filled 2021-05-14: qty 2

## 2021-05-14 MED ORDER — ACETAMINOPHEN 10 MG/ML IV SOLN
INTRAVENOUS | Status: AC
  Filled 2021-05-14: qty 100

## 2021-05-14 MED ORDER — OXYCODONE HCL 5 MG PO TABS: 5.0000 mg | ORAL_TABLET | Freq: Four times a day (QID) | ORAL | 0 refills | Status: DC | PRN

## 2021-05-14 MED ORDER — DEXAMETHASONE SODIUM PHOSPHATE 10 MG/ML IJ SOLN
INTRAMUSCULAR | Status: DC | PRN
  Administered 2021-05-14: 10 mg via INTRAVENOUS

## 2021-05-14 MED ORDER — HYDROMORPHONE HCL 1 MG/ML IJ SOLN
0.2500 mg | Freq: Once | INTRAMUSCULAR | Status: AC
Start: 2021-05-14 — End: 2021-05-14
  Administered 2021-05-14: 0.25 mg via INTRAVENOUS

## 2021-05-14 MED ORDER — ORAL CARE MOUTH RINSE: 15.0000 mL | Freq: Once | OROMUCOSAL | Status: AC

## 2021-05-14 MED ORDER — OXYCODONE HCL 5 MG PO TABS: 10.0000 mg | ORAL_TABLET | ORAL | Status: DC | PRN

## 2021-05-14 MED ORDER — BUPIVACAINE HCL (PF) 0.25 % IJ SOLN
INTRAMUSCULAR | Status: AC
  Filled 2021-05-14: qty 30

## 2021-05-14 MED ORDER — CEFAZOLIN SODIUM-DEXTROSE 2-4 GM/100ML-% IV SOLN
INTRAVENOUS | Status: AC
  Filled 2021-05-14: qty 100

## 2021-05-14 MED ORDER — ONDANSETRON HCL 4 MG/2ML IJ SOLN
4.0000 mg | Freq: Once | INTRAMUSCULAR | Status: AC | PRN
  Administered 2021-05-14: 4 mg via INTRAVENOUS

## 2021-05-14 SURGICAL SUPPLY — 68 items
ADH SKN CLS APL DERMABOND .7 (GAUZE/BANDAGES/DRESSINGS) ×1
APL LAPSCP 35 DL APL RGD (MISCELLANEOUS) ×1
APL PRP STRL LF DISP 70% ISPRP (MISCELLANEOUS) ×3
APPLICATOR VISTASEAL 35 (MISCELLANEOUS) ×2 IMPLANT
BAG INFUSER PRESSURE 100CC (MISCELLANEOUS) ×2 IMPLANT
BAG SPEC RTRVL LRG 6X4 10 (ENDOMECHANICALS) ×1
BLADE SURG SZ11 CARB STEEL (BLADE) ×2 IMPLANT
CANNULA REDUC XI 12-8 STAPL (CANNULA) ×2
CANNULA REDUCER 12-8 DVNC XI (CANNULA) ×1 IMPLANT
CHLORAPREP W/TINT 26 (MISCELLANEOUS) ×6 IMPLANT
COVER TIP SHEARS 8 DVNC (MISCELLANEOUS) ×1 IMPLANT
COVER TIP SHEARS 8MM DA VINCI (MISCELLANEOUS) ×2
COVER WAND RF STERILE (DRAPES) ×4 IMPLANT
DEFOGGER SCOPE WARMER CLEARIFY (MISCELLANEOUS) ×2 IMPLANT
DERMABOND ADVANCED (GAUZE/BANDAGES/DRESSINGS) ×1
DERMABOND ADVANCED .7 DNX12 (GAUZE/BANDAGES/DRESSINGS) ×1 IMPLANT
DRAPE ARM DVNC X/XI (DISPOSABLE) ×3 IMPLANT
DRAPE COLUMN DVNC XI (DISPOSABLE) ×1 IMPLANT
DRAPE DA VINCI XI ARM (DISPOSABLE) ×6
DRAPE DA VINCI XI COLUMN (DISPOSABLE) ×2
ELECT CAUTERY BLADE TIP 2.5 (TIP) ×2
ELECT REM PT RETURN 9FT ADLT (ELECTROSURGICAL) ×2
ELECTRODE CAUTERY BLDE TIP 2.5 (TIP) ×1 IMPLANT
ELECTRODE REM PT RTRN 9FT ADLT (ELECTROSURGICAL) ×1 IMPLANT
GLOVE SURG ENC MOIS LTX SZ6.5 (GLOVE) ×6 IMPLANT
GLOVE SURG UNDER POLY LF SZ7 (GLOVE) ×6 IMPLANT
GOWN STRL REUS W/ TWL LRG LVL3 (GOWN DISPOSABLE) ×3 IMPLANT
GOWN STRL REUS W/TWL LRG LVL3 (GOWN DISPOSABLE) ×6
GRADUATE 1200CC STRL 31836 (MISCELLANEOUS) ×2 IMPLANT
GRASPER SUT TROCAR 14GX15 (MISCELLANEOUS) ×2 IMPLANT
IRRIGATOR SUCT 8 DISP DVNC XI (IRRIGATION / IRRIGATOR) ×1 IMPLANT
IRRIGATOR SUCTION 8MM XI DISP (IRRIGATION / IRRIGATOR) ×2
IV NS 1000ML (IV SOLUTION) ×2
IV NS 1000ML BAXH (IV SOLUTION) ×1 IMPLANT
KIT PINK PAD W/HEAD ARE REST (MISCELLANEOUS) ×2
KIT PINK PAD W/HEAD ARM REST (MISCELLANEOUS) ×1 IMPLANT
LABEL OR SOLS (LABEL) ×2 IMPLANT
MANIFOLD NEPTUNE II (INSTRUMENTS) ×2 IMPLANT
MESH 3DMAX 3X5 LT MED (Mesh General) ×2 IMPLANT
MESH VENTRALIGHT ST 4X6IN (Mesh General) ×2 IMPLANT
NEEDLE HYPO 22GX1.5 SAFETY (NEEDLE) ×2 IMPLANT
NEEDLE INSUFFLATION 14GA 120MM (NEEDLE) ×2 IMPLANT
NS IRRIG 500ML POUR BTL (IV SOLUTION) ×2 IMPLANT
OBTURATOR OPTICAL STANDARD 8MM (TROCAR) ×2
OBTURATOR OPTICAL STND 8 DVNC (TROCAR) ×1
OBTURATOR OPTICALSTD 8 DVNC (TROCAR) ×1 IMPLANT
PACK LAP CHOLECYSTECTOMY (MISCELLANEOUS) ×2 IMPLANT
PENCIL ELECTRO HAND CTR (MISCELLANEOUS) ×2 IMPLANT
POUCH SPECIMEN RETRIEVAL 10MM (ENDOMECHANICALS) ×2 IMPLANT
SEAL CANN UNIV 5-8 DVNC XI (MISCELLANEOUS) ×3 IMPLANT
SEAL XI 5MM-8MM UNIVERSAL (MISCELLANEOUS) ×6
SET TUBE SMOKE EVAC HIGH FLOW (TUBING) ×2 IMPLANT
SOLUTION ELECTROLUBE (MISCELLANEOUS) ×2 IMPLANT
STAPLER CANNULA SEAL DVNC XI (STAPLE) ×1 IMPLANT
STAPLER CANNULA SEAL XI (STAPLE) ×2
STRIP CLOSURE SKIN 1/2X4 (GAUZE/BANDAGES/DRESSINGS) ×2 IMPLANT
SUT MNCRL 4-0 (SUTURE) ×2
SUT MNCRL 4-0 27XMFL (SUTURE) ×1
SUT V-LOC 90 ABS 3-0 VLT  V-20 (SUTURE) ×12
SUT V-LOC 90 ABS 3-0 VLT V-20 (SUTURE) ×6 IMPLANT
SUT VIC AB 2-0 SH 27 (SUTURE) ×2
SUT VIC AB 2-0 SH 27XBRD (SUTURE) ×1 IMPLANT
SUT VICRYL 0 AB UR-6 (SUTURE) ×2 IMPLANT
SUT VLOC 90 6 CV-15 VIOLET (SUTURE) ×6 IMPLANT
SUT VLOC 90 S/L VL9 GS22 (SUTURE) IMPLANT
SUTURE MNCRL 4-0 27XMF (SUTURE) ×1 IMPLANT
TAPE TRANSPORE STRL 2 31045 (GAUZE/BANDAGES/DRESSINGS) ×2 IMPLANT
TRAY FOLEY MTR SLVR 16FR STAT (SET/KITS/TRAYS/PACK) ×2 IMPLANT

## 2021-05-14 NOTE — Progress Notes (Signed)
Discussed patient with Dr. Randa Lynn and Dr. Kayleen Memos.  Patient has received total of 158mcq fentanyl in pacu, 10mg  po oxycodone and 0.25 IV dilaudid, also 4mg  IV zofran.  Patient states pain remains 9/10  vitals 155/77 HR 81, will continue to monitor.  Dr. Kayleen Memos made aware of blood sugar 229 post op, states for patient to take blood sugar medication when he gets home. Patient verbalizes understanding.

## 2021-05-14 NOTE — Transfer of Care (Signed)
Immediate Anesthesia Transfer of Care Note  Patient: Ray Robles  Procedure(s) Performed: XI ROBOTIC ASSISTED INGUINAL HERNIA REPAIR WITH MESH (Bilateral)  Patient Location: PACU  Anesthesia Type:General  Level of Consciousness: awake, alert  and oriented  Airway & Oxygen Therapy: Patient Spontanous Breathing and Patient connected to face mask oxygen  Post-op Assessment: Report given to RN and Post -op Vital signs reviewed and stable  Post vital signs: Reviewed and stable  Last Vitals:  Vitals Value Taken Time  BP 156/70 05/14/21 1418  Temp    Pulse 90 05/14/21 1420  Resp 16 05/14/21 1420  SpO2 100 % 05/14/21 1420  Vitals shown include unvalidated device data.  Last Pain:  Vitals:   05/14/21 0625  TempSrc: Temporal  PainSc: 5          Complications: No notable events documented.

## 2021-05-14 NOTE — Anesthesia Procedure Notes (Signed)
Date/Time: 05/14/2021 7:47 AM Performed by: Philbert Riser, CRNA

## 2021-05-14 NOTE — Anesthesia Preprocedure Evaluation (Signed)
Anesthesia Evaluation  Patient identified by MRN, date of birth, ID band Patient awake    Reviewed: Allergy & Precautions, H&P , NPO status , Patient's Chart, lab work & pertinent test results, reviewed documented beta blocker date and time   History of Anesthesia Complications Negative for: history of anesthetic complications  Airway Mallampati: III  TM Distance: >3 FB Neck ROM: full    Dental  (+) Edentulous Upper, Edentulous Lower   Pulmonary neg pulmonary ROS, shortness of breath and with exertion, sleep apnea , neg COPD, neg recent URI,    Pulmonary exam normal        Cardiovascular Exercise Tolerance: Poor hypertension, On Medications (-) angina(-) Past MI and (-) Cardiac Stents negative cardio ROS Normal cardiovascular exam+ dysrhythmias + Valvular Problems/Murmurs  Rhythm:regular Rate:Normal     Neuro/Psych  Headaches, neg Seizures PSYCHIATRIC DISORDERS Anxiety Depression  Neuromuscular disease    GI/Hepatic hiatal hernia, PUD, GERD  Medicated,(+) Cirrhosis       , Hepatitis -, C  Endo/Other  diabetesHypothyroidism   Renal/GU negative Renal ROS  negative genitourinary   Musculoskeletal   Abdominal   Peds  Hematology negative hematology ROS (+)   Anesthesia Other Findings Past Medical History: No date: Anxiety No date: Arthritis No date: Cirrhosis ([place]) No date: Depression No date: Dyspnea     Comment:  doe No date: Dysrhythmia No date: GERD (gastroesophageal reflux disease)     Comment:  food sticks in throat has esophagus stretched 11/11/2007: H/O bacterial pneumonia     Comment:  2008 and 2015  No date: Heart murmur No date: Hepatitis C No date: Hx of endoscopy     Comment:  05/12/2014- [facility]. Dr Candace Cruise. Benign appearing stricture.               Dilated the examination was otherwise normal.02/13/2012-               Beniign appearing intrinsic mild stenosis at GE junction,              successfully  dialated No date: Hypertension No date: Hypothyroidism No date: Liver disease No date: Neuromuscular disorder (HCC)     Comment:  chronic pain peripheral neuropathy No date: Pre-diabetes No date: PTSD (post-traumatic stress disorder) Past Surgical History: 05/12/2015: COLONOSCOPY WITH PROPOFOL; N/A     Comment:  Procedure: COLONOSCOPY WITH PROPOFOL;  Surgeon: Hulen Luster, MD;  Location: [facility] ENDOSCOPY;  Service:               Gastroenterology;  Laterality: N/A; No date: edg 08/2012: HERNIA REPAIR     Comment:  Dr. Jamal Collin; [facility]   Reproductive/Obstetrics negative OB ROS                             Anesthesia Physical  Anesthesia Plan  ASA: 3  Anesthesia Plan: General   Post-op Pain Management:    Induction: Intravenous  PONV Risk Score and Plan: 3 and Ondansetron, Dexamethasone, Midazolam and Treatment may vary due to age or medical condition  Airway Management Planned: Oral ETT  Additional Equipment:   Intra-op Plan:   Post-operative Plan: Extubation in OR  Informed Consent: I have reviewed the patients History and Physical, chart, labs and discussed the procedure including the risks, benefits and alternatives for the proposed anesthesia with the patient or authorized representative who has indicated his/her understanding and acceptance.  Dental Advisory Given  Plan Discussed with: CRNA  Anesthesia Plan Comments:         Anesthesia Quick Evaluation

## 2021-05-14 NOTE — Anesthesia Procedure Notes (Signed)
Procedure Name: Intubation Date/Time: 05/14/2021 7:47 AM Performed by: Philbert Riser, CRNA Pre-anesthesia Checklist: Patient identified, Emergency Drugs available, Suction available and Patient being monitored Patient Re-evaluated:Patient Re-evaluated prior to induction Oxygen Delivery Method: Circle system utilized Preoxygenation: Pre-oxygenation with 100% oxygen Induction Type: IV induction Ventilation: Mask ventilation without difficulty Laryngoscope Size: McGraph and 3 Grade View: Grade I Tube type: Oral Tube size: 7.5 mm Number of attempts: 1 Airway Equipment and Method: Stylet and Oral airway Placement Confirmation: ETT inserted through vocal cords under direct vision, positive ETCO2 and breath sounds checked- equal and bilateral Secured at: 22 cm Tube secured with: Tape Dental Injury: Teeth and Oropharynx as per pre-operative assessment

## 2021-05-14 NOTE — Interval H&P Note (Signed)
History and Physical Interval Note:  05/14/2021 7:15 AM  Ray Robles  has presented today for surgery, with the diagnosis of bilateral inguinal hernia.  The various methods of treatment have been discussed with the patient and family. After consideration of risks, benefits and other options for treatment, the patient has consented to  Procedure(s): XI ROBOTIC [place] (Bilateral) as a surgical intervention.  The patient's history has been reviewed, patient examined, no change in status, stable for surgery.  I have reviewed the patient's chart and labs.  Questions were answered to the patient's satisfaction.     Fredirick Maudlin

## 2021-05-14 NOTE — Discharge Instructions (Signed)

## 2021-05-15 ENCOUNTER — Encounter: Payer: Self-pay | Admitting: General Surgery

## 2021-05-16 NOTE — Op Note (Signed)
Robot-Assisted Laparoscopic Transabdominal Inguinal Hernia Repair   Pre-operative Diagnosis: Recurrent recurrent strangulated right inguinal hernia; left inguinal hernia   Post-operative Diagnosis: Same   Procedure: robot-assisted laparoscopic repair of a strangulated recurrent recurrent right inguinal hernia and robot-assisted laparoscopic repair of a left inguinal hernia.  Partial omentectomy.   Surgeon: Fredirick Maudlin, MD   Anesthesia: GETA   Findings: On the right, there was extensive scar tissue and the omentum was incarcerated within the hernia.  The peritoneum was extremely friable and shredded during the dissection.  It was a direct defect and I felt that there was likely a large wad of mesh in the superficial layers that I was unable to reach via the transabdominal approach.  On the left, there were small defects in both the direct and indirect position, containing only a small amount of fat.  Indications: The patient presents with a symptomatic right inguinal hernia.  He had previously undergone 2 previous right inguinal hernia repairs and now had a recurrence.  Imaging also identified a left inguinal hernia.  He was offered repair of both via a robot-assisted approach.   Procedure In Detail:   The patient was seen again in the holding room. The benefits, complications, treatment options, and expected outcomes were discussed with the patient. The risks of bleeding, infection, recurrence of symptoms, failure to resolve symptoms, recurrence of hernia, ischemic orchitis, chronic pain syndrome or neuroma, were discussed again. The likelihood of improving the patient's symptoms with return to their baseline status is good.  The patient and/or family concurred with the proposed plan, giving informed consent.   The patient was brought to the operating room and placed supine on the OR table.  Prior to the induction of general anesthesia, antibiotic prophylaxis was administered. VTE prophylaxis  was in place. General endotracheal anesthesia was then administered and tolerated well. After the induction, the abdomen was prepped with Chloraprep and draped in the sterile fashion.  A Foley catheter was aseptically placed.  The patient wa sterilely prepped and draped in standard fashion.  A timeout was performed confirming the patient's identity, the procedure being performed, his allergies, all necessary equipment was available, and that maintenance anesthesia was adequate.   Optiview technique was used to enter the abdomen in a supraumbilical location, just off of the midline.  Pneumoperitoneum was achieved without hemodynamic changes.  Two 8 mm robotic trochars were placed under direct vision.  The Optiview trocar was replaced with a third 8 mm robotic trocar.  The skin and subcutaneous tissues were infiltrated with local anesthetic prior to making the incisions and placing the trochars.  A small direct and small indirect hernia were appreciated on the left; a large direct right inguinal hernia containing omentum and with visible dense adhesions was seen.  Infarcted omentum.  The robot was brought to the table and docked in the standard fashion.  No collision between the arms was observed.  The instruments were kept under direct view at all times.  We elevated the peritoneal flap on the right.  The tissue was extremely friable and essentially shredded into pieces as the dissection was attempted.  Nearly 3 hours of careful tedious dissection was required to reduce the incarcerated omentum.  Once the omentum was free, it was visibly ischemic and infarcted.  Upon further inspection of the defect, there appeared to be visible mesh entrapping the sac.  The surgical assistant palpated the groin area and reported feeling hard irregular tissue, suggesting a wad of mesh was present.  I  elected to amputate the sac in order to facilitate the repair.  Once this was done, I had good exposure of the defect.  Due to  the lack of peritoneum to cover any mesh prosthesis, I elected to use a ventral light mesh.  An appropriately sized mesh was selected and placed into the abdominal cavity.  It was sutured in place using 3-0 V-lock suture.  I had good coverage of the direct, indirect and femoral spaces via this approach.  I attempted to partially cover the mesh with what peritoneum was available to me, but again, it was extremely friable and did not hold suture well.  I elected to abort any further peritoneal flap closure at that point and address the left-sided hernias.  The peritoneal flap was opened and easily dissected out on the left, due to the lack of prior surgical intervention on this side.  The sac was reduced and dissected free from adjacent structures. We preserved the vas and the vessels. Once dissection was completed a medium Bard 3D mesh was placed and tacked in place with 3-0 Vicryl suture.  Hemostasis was confirmed. There was excellent coverage of the direct, indirect and femoral spaces. The flap was closed with a V-Loc suture   Second look revealed no complications or injuries.  The infarcted omentum, however did not appear viable and I was concerned for potential infection due to the ischemic tissue.  Using the bipolar and monopolar scissors, the ischemic omentum was excised.  The robot was undocked and withdrawn from the surgical field.  I upsized one of the 8 mm ports to accommodate a 12 mm port so that the ischemic omentum could be withdrawn using an Endopouch bag.  Once the omentum was removed, the PMI and Carter-Thomason cone were used to close the fascia with interrupted 0 Vicryl suture.  The remaining trochars were withdrawn and no evidence of injury or bleeding was identified.   Each site was closed with interrupted subcuticular Monocryl.  The skin was cleaned.  Dermabond and Steri-Strips were applied.  The Foley catheter was removed.  The patient was awakened, extubated, and taken to the  postanesthesia care unit in good condition.  EBL: 25 cc   IVF: see anesthesia record  UOP:  see anesthesia record  Specimens: None  Complications: None immediately apparent      Fredirick Maudlin, MD, FACS

## 2021-05-16 NOTE — Anesthesia Postprocedure Evaluation (Signed)
Anesthesia Post Note  Patient: Ray Robles  Procedure(s) Performed: XI ROBOTIC ASSISTED INGUINAL HERNIA REPAIR WITH MESH (Bilateral)  Patient location during evaluation: PACU Anesthesia Type: General Level of consciousness: awake and alert and oriented Pain management: pain level controlled Vital Signs Assessment: post-procedure vital signs reviewed and stable Respiratory status: spontaneous breathing, nonlabored ventilation and respiratory function stable Cardiovascular status: blood pressure returned to baseline and stable Postop Assessment: no signs of nausea or vomiting Anesthetic complications: no   No notable events documented.   Last Vitals:  Vitals:   05/14/21 1546 05/14/21 1634  BP: (!) 156/75 (!) 157/73  Pulse: 77 77  Resp: 16 16  Temp: (!) 36.3 C   SpO2: 98% 98%    Last Pain:  Vitals:   05/14/21 1546  TempSrc: Temporal  PainSc: 8                  Yamil Oelke

## 2021-05-23 ENCOUNTER — Other Ambulatory Visit (HOSPITAL_COMMUNITY): Payer: Self-pay | Admitting: Psychiatry

## 2021-05-23 DIAGNOSIS — F3342 Major depressive disorder, recurrent, in full remission: Secondary | ICD-10-CM

## 2021-05-23 DIAGNOSIS — F4310 Post-traumatic stress disorder, unspecified: Secondary | ICD-10-CM

## 2021-05-23 NOTE — Telephone Encounter (Signed)
Hi Lea, can you please contact this pt for an appt with Dr. Modesta Messing as soon as possible. Thanks.

## 2021-05-24 ENCOUNTER — Other Ambulatory Visit (HOSPITAL_COMMUNITY): Payer: Self-pay | Admitting: Psychiatry

## 2021-05-24 DIAGNOSIS — F4310 Post-traumatic stress disorder, unspecified: Secondary | ICD-10-CM

## 2021-05-24 DIAGNOSIS — F3342 Major depressive disorder, recurrent, in full remission: Secondary | ICD-10-CM

## 2021-05-25 ENCOUNTER — Other Ambulatory Visit: Payer: Self-pay | Admitting: Family Medicine

## 2021-05-25 DIAGNOSIS — E039 Hypothyroidism, unspecified: Secondary | ICD-10-CM

## 2021-05-29 ENCOUNTER — Encounter: Payer: Medicare Other | Admitting: General Surgery

## 2021-05-31 ENCOUNTER — Encounter: Payer: Self-pay | Admitting: General Surgery

## 2021-05-31 ENCOUNTER — Ambulatory Visit (INDEPENDENT_AMBULATORY_CARE_PROVIDER_SITE_OTHER): Payer: Medicare Other | Admitting: General Surgery

## 2021-05-31 ENCOUNTER — Other Ambulatory Visit: Payer: Self-pay

## 2021-05-31 VITALS — BP 147/84 | HR 80 | Temp 98.1°F | Ht 70.5 in | Wt 201.0 lb

## 2021-05-31 DIAGNOSIS — Z8719 Personal history of other diseases of the digestive system: Secondary | ICD-10-CM

## 2021-05-31 DIAGNOSIS — Z9889 Other specified postprocedural states: Secondary | ICD-10-CM

## 2021-05-31 NOTE — Patient Instructions (Addendum)
Continue to use ice to the area several times a day. You should wear compression shorts. This will help with the swelling.   Take 800 mg Ibuprofen every 6 hours around the clock for the next 4-5 day.   Call us if the swelling and pain do not improve in 1 month.   GENERAL POST-OPERATIVE PATIENT INSTRUCTIONS   WOUND CARE INSTRUCTIONS:  Keep a dry clean dressing on the wound if there is drainage. The initial bandage may be removed after 24 hours.  Once the wound has quit draining you may leave it open to air.  If clothing rubs against the wound or causes irritation and the wound is not draining you may cover it with a dry dressing during the daytime.  Try to keep the wound dry and avoid ointments on the wound unless directed to do so.  If the wound becomes bright red and painful or starts to drain infected material that is not clear, please contact your physician immediately.  If the wound is mildly pink and has a thick firm ridge underneath it, this is normal, and is referred to as a healing ridge.  This will resolve over the next 4-6 weeks.  BATHING: You may shower if you have been informed of this by your surgeon. However, Please do not submerge in a tub, hot tub, or pool until incisions are completely sealed or have been told by your surgeon that you may do so.  DIET:  You may eat any foods that you can tolerate.  It is a good idea to eat a high fiber diet and take in plenty of fluids to prevent constipation.  If you do become constipated you may want to take a mild laxative or take ducolax tablets on a daily basis until your bowel habits are regular.  Constipation can be very uncomfortable, along with straining, after recent surgery.  ACTIVITY:  You are encouraged to cough and deep breath or use your incentive spirometer if you were given one, every 15-30 minutes when awake.  This will help prevent respiratory complications and low grade fevers post-operatively if you had a general anesthetic.  You  may want to hug a pillow when coughing and sneezing to add additional support to the surgical area, if you had abdominal or chest surgery, which will decrease pain during these times.  You are encouraged to walk and engage in light activity for the next two weeks.  You should not lift more than 20 pounds for 6 weeks after surgery as it could put you at increased risk for complications.  Twenty pounds is roughly equivalent to a plastic bag of groceries. At that time- Listen to your body when lifting, if you have pain when lifting, stop and then try again in a few days. Soreness after doing exercises or activities of daily living is normal as you get back in to your normal routine.  MEDICATIONS:  Try to take narcotic medications and anti-inflammatory medications, such as tylenol, ibuprofen, naprosyn, etc., with food.  This will minimize stomach upset from the medication.  Should you develop nausea and vomiting from the pain medication, or develop a rash, please discontinue the medication and contact your physician.  You should not drive, make important decisions, or operate machinery when taking narcotic pain medication.  SUNBLOCK Use sun block to incision area over the next year if this area will be exposed to sun. This helps decrease scarring and will allow you avoid a permanent darkened area over your incision.  QUESTIONS:  Please feel free to call our office if you have any questions, and we will be glad to assist you. [phone].

## 2021-05-31 NOTE — Progress Notes (Signed)
Ray Robles is here today for a postoperative visit.  He is a 60 year old man who underwent robot-assisted bilateral inguinal hernia repair.  The left side was [place] and was quite straightforward, but the right had been previously operated on twice before, both via an open approach.  The right side contained strangulated omentum and due to the infarcted tissue, a partial omentectomy was required.  There was heavy scarring present on the right and the peritoneum shredded during my attempts to open a flap for repair.  I ended up needing to sew in a VentraLight patch.  I also felt like he potentially had a bunched up piece of mesh on the right that was inaccessible for removal via the transperitoneal approach.  Today, he states that he is having a fair amount of pain and swelling on the left side.  The right side does not bother him much.  He is eating, urinating, and defecating normally.  He says the pain on the left comes and goes but is worse with activity.  Due to his use of Suboxone, the narcotic pain medications that were prescribed did not help him.  He is unable to take Tylenol secondary to significant liver disease.  He has been relying upon ibuprofen but states that this has not been as effective as he would like.  He has not worn compression shorts and has not been icing the area, either.  Today's Vitals   05/31/21 1405  BP: (!) 147/84  Pulse: 80  Temp: 98.1 F (36.7 C)  SpO2: 96%  Weight: 201 lb (91.2 kg)  Height: 5' 10.5" (1.791 m)   Body mass index is 28.43 kg/m. Focused examination of the surgical area performed with chaperone present.  The robotic trocar sites are healing well.  There is no erythema, induration, or drainage present.  I actually do not appreciate any bruising or swelling in the inguinal areas or genitals.  The bulky tissue/mesh in the right groin remains present, but it is not bothersome to him at this time.  Impression and plan: This is a 60 year old man who  had a robot-assisted laparoscopic bilateral inguinal hernia repair.  He is still having more pain than he expected and reports swelling in the left side.  I have advised that he wear the compression shorts that were recommended as well as ice the area as needed.  Because he cannot take acetaminophen due to his liver disease and narcotics are ineffective secondary to Suboxone use, I have recommended that he take ibuprofen on a scheduled basis around-the-clock for the next 3 to 4 days to see if there is any improvement.  Hopefully, within the next couple of weeks, he will have improvement and resolution of his pain.  If he does not, we may need to perform a CT scan.  I have asked him and his wife to keep [place] informed about his progress so that we may intervene if necessary.
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
## 2021-06-06 ENCOUNTER — Encounter: Payer: Self-pay | Admitting: Psychiatry

## 2021-06-06 ENCOUNTER — Other Ambulatory Visit: Payer: Self-pay

## 2021-06-06 ENCOUNTER — Telehealth (INDEPENDENT_AMBULATORY_CARE_PROVIDER_SITE_OTHER): Payer: Medicare Other | Admitting: Psychiatry

## 2021-06-06 DIAGNOSIS — F4310 Post-traumatic stress disorder, unspecified: Secondary | ICD-10-CM

## 2021-06-06 DIAGNOSIS — F3342 Major depressive disorder, recurrent, in full remission: Secondary | ICD-10-CM | POA: Diagnosis not present

## 2021-06-06 DIAGNOSIS — F1120 Opioid dependence, uncomplicated: Secondary | ICD-10-CM

## 2021-06-06 MED ORDER — VENLAFAXINE HCL ER 75 MG PO CP24: 75.0000 mg | ORAL_CAPSULE | Freq: Every day | ORAL | 1 refills | Status: DC

## 2021-06-06 MED ORDER — PRAZOSIN HCL 5 MG PO CAPS: 5.0000 mg | ORAL_CAPSULE | Freq: Every day | ORAL | 0 refills | Status: DC

## 2021-06-06 MED ORDER — VENLAFAXINE HCL ER 150 MG PO CP24: 150.0000 mg | ORAL_CAPSULE | Freq: Every day | ORAL | 0 refills | Status: DC

## 2021-06-08 ENCOUNTER — Other Ambulatory Visit: Payer: Self-pay | Admitting: Family Medicine

## 2021-06-08 DIAGNOSIS — E039 Hypothyroidism, unspecified: Secondary | ICD-10-CM

## 2021-06-08 DIAGNOSIS — E785 Hyperlipidemia, unspecified: Secondary | ICD-10-CM

## 2021-06-08 NOTE — Telephone Encounter (Signed)
Requested medication (s) are due for refill today: Yes  Requested medication (s) are on the active medication list: Yes  Last refill:  05/22/20  Future visit scheduled: Yes  Notes to clinic:  Unable to refill per protocol, Rx expired.      Requested Prescriptions  Pending Prescriptions Disp Refills   ezetimibe (ZETIA) 10 MG tablet [Pharmacy Med Name: EZETIMIBE 10 MG TAB] 30 tablet 11    Sig: TAKE 1 TABLET BY MOUTH ONCE DAILY      Cardiovascular:  Antilipid - Sterol Transport Inhibitors Failed - 06/08/2021  2:50 PM      Failed - LDL in normal range and within 360 days    LDL Chol Calc (NIH)  Date Value Ref Range Status  11/13/2020 119 (H) 0 - 99 mg/dL Final          Failed - HDL in normal range and within 360 days    HDL  Date Value Ref Range Status  11/13/2020 35 (L) >39 mg/dL Final          Passed - Total Cholesterol in normal range and within 360 days    Cholesterol, Total  Date Value Ref Range Status  11/13/2020 180 100 - 199 mg/dL Final          Passed - Triglycerides in normal range and within 360 days    Triglycerides  Date Value Ref Range Status  11/13/2020 144 0 - 149 mg/dL Final          Passed - Valid encounter within last 12 months    Recent Outpatient Visits           1 month ago Unilateral recurrent inguinal hernia without obstruction or gangrene   [facility] Birdie Sons, MD   3 months ago Controlled type 2 diabetes mellitus without complication, without long-term current use of insulin ([place])   [facility] Birdie Sons, MD   6 months ago [place] Birdie Sons, MD   9 months ago Flu-like symptoms   [facility] Birdie Sons, MD   11 months ago Other fatigue   [facility] Birdie Sons, MD                 Refused Prescriptions Disp Refills   levothyroxine (SYNTHROID) 88 MCG tablet [Pharmacy Med Name: LEVOTHYROXINE SODIUM 88 MCG  TAB] 90 tablet 2    Sig: TAKE 1 TABLET BY MOUTH ONCE DAILY ON AN EMPTY STOMACH. WAIT 30 MINUTES BEFORE TAKING OTHER MEDS.      Endocrinology:  Hypothyroid Agents Failed - 06/08/2021  2:50 PM      Failed - TSH needs to be rechecked within 3 months after an abnormal result. Refill until TSH is due.      Passed - TSH in normal range and within 360 days    TSH  Date Value Ref Range Status  11/13/2020 0.801 0.450 - 4.500 uIU/mL Final          Passed - Valid encounter within last 12 months    Recent Outpatient Visits           1 month ago Unilateral recurrent inguinal hernia without obstruction or gangrene   [facility] Birdie Sons, MD   3 months ago Controlled type 2 diabetes mellitus without complication, without long-term current use of insulin [facility])   [facility] Birdie Sons, MD   6 months ago [facility] Birdie Sons, MD   9 months ago Flu-like symptoms   [facility] Birdie Sons, MD   11 months ago Other fatigue   [facility] Birdie Sons, MD

## 2021-07-02 ENCOUNTER — Ambulatory Visit: Payer: Medicare Other | Admitting: Family Medicine

## 2021-07-02 NOTE — Progress Notes (Deleted)
Complete physical exam   Patient: Ray Robles   DOB: 12/12/1960   60 y.o. Male  MRN: [id number] Visit Date: 07/02/2021  Today's healthcare provider: Lelon Huh, MD   No chief complaint on file.  Subjective    Ray Robles is a 60 y.o. male who presents today for a complete physical exam.  He reports consuming a {diet types:17450} diet. {Exercise:19826} He generally feels {well/fairly well/poorly:18703}. He reports sleeping {well/fairly well/poorly:18703}. He {does/does not:200015} have additional problems to discuss today.  HPI  ***  Past Medical History:  Diagnosis Date   Anxiety    Arthritis    Chronic pain disorder    on buprenorphrine-naloxone   Cirrhosis ([place])    a.) 10/04/2019 - Child Pugh A: 5 and MELD-Na score: 8   Depression    Dysrhythmia    Exertional dyspnea    GERD (gastroesophageal reflux disease)    food sticks in throat has esophagus stretched   H/O bacterial pneumonia 11/11/2007   2008 and 2015    Heart murmur    Hepatic encephalopathy (HCC)    takes daily lactulose and rifaximin   Hepatitis C    treatment naive genotype 3; Tx'd with 12 weeks Epclusa and Ribavirin   Hiatal hernia    History of substance abuse ([place])    in remission; uses buprenorphrine-naloxone   Hx of endoscopy    05/12/2014- [facility]. Dr Candace Cruise. Benign appearing stricture. Dilated the examination was otherwise normal.02/13/2012- Beniign appearing intrinsic mild stenosis at GE junction, successfully dialated   Hypertension    Hypothyroidism    On supplemental oxygen by nasal cannula    2L PRN   Opioid dependence in early, early partial, sustained full, or sustained partial remission (HCC)    Peripheral neuropathy    Pneumonia    PTSD (post-traumatic stress disorder)    PUD (peptic ulcer disease)    Renal cyst, right    Sleep apnea    compliance issues with prescribed nocturnal BiPAP therapy   Splenomegaly    a.)  14.6 cm on 09/06/2019 MRI   T2DM (type 2 diabetes  mellitus) ([place])    Past Surgical History:  Procedure Laterality Date   COLONOSCOPY WITH PROPOFOL N/A 05/12/2015   Procedure: COLONOSCOPY WITH PROPOFOL;  Surgeon: Hulen Luster, MD;  Location: [facility] ENDOSCOPY;  Service: Gastroenterology;  Laterality: N/A;   edg     HERNIA REPAIR  08/2012   Dr. Jamal Collin; [place] Right 10/28/2017   Procedure: HERNIA REPAIR INGUINAL ADULT;  Surgeon: Leonie Green, MD;  Location: [facility] ORS;  Service: General;  Laterality: Right;   XI ROBOTIC ASSISTED INGUINAL HERNIA REPAIR WITH MESH Bilateral 05/14/2021   Procedure: XI ROBOTIC ASSISTED INGUINAL HERNIA REPAIR WITH MESH;  Surgeon: Fredirick Maudlin, MD;  Location: [facility] ORS;  Service: General;  Laterality: Bilateral;   Social History   Socioeconomic History   Marital status: Married    Spouse name: Comptroller   Number of children: 4   Years of education: Not on file   Highest education level: Associate degree: occupational, Hotel manager, or vocational program  Occupational History    Comment: retired  Tobacco Use   Smoking status: Never   Smokeless tobacco: Never  Vaping Use   Vaping Use: Never used  Substance and Sexual Activity   Alcohol use: No    Alcohol/week: 0.0 standard drinks   Drug use: Not Currently    Types: Oxycodone   Sexual activity: Not Currently  Other  Topics Concern   Not on file  Social History Narrative   Not on file   Social Determinants of Health   Financial Resource Strain: Not on file  Food Insecurity: Not on file  Transportation Needs: Not on file  Physical Activity: Not on file  Stress: Not on file  Social Connections: Not on file  Intimate Partner Violence: Not on file   Family Status  Relation Name Status   Mother  Deceased   Father  Deceased   Brother  Deceased   Brother  Alive   Brother  Alive   Brother  Alive   PGF  (Not Specified)   Psychiatrist  Other   Family History  Problem Relation Age of Onset   Cancer Mother        Throat, lung,  skin   COPD Mother    Arthritis Mother    Diabetes Father    Arthritis Father    Cancer Father        Bladder   Alcohol abuse Brother    Diabetes Brother    Alcohol abuse Brother    Drug abuse Brother    Cancer Paternal Grandfather    Cancer Paternal Uncle    Allergies  Allergen Reactions   Ambien [Zolpidem Tartrate] Other (See Comments)    Reaction: Violent sleep   Ibuprofen Other (See Comments)    Can't take because of liver   Morphine Diarrhea and Nausea And Vomiting   Tylenol [Acetaminophen] Other (See Comments)    Can't take because of liver   Xanax [Alprazolam] Other (See Comments)    Brings the ammonia levels up   [place]     seizures    Patient Care Team: Birdie Sons, MD as PCP - General (Family Medicine) Earnie Larsson, MD as Consulting Physician (Neurosurgery) Manya Silvas, MD (Inactive) (Gastroenterology) Emmaline Kluver., MD (Rheumatology) Oh, Lupita Dawn, MD (Inactive) (Gastroenterology) Rayvon Char, MD as Referring Physician (Gastroenterology) Christene Lye, MD as Consulting Physician (General Surgery) Birdie Sons, MD as Referring Physician (Family Medicine)   Medications: Outpatient Medications Prior to Visit  Medication Sig   Accu-Chek Softclix Lancets lancets Use to check blood sugar daily for type 2 diabetes   albuterol (VENTOLIN HFA) 108 (90 Base) MCG/ACT inhaler Inhale 2 puffs into the lungs every 6 (six) hours as needed for wheezing or shortness of breath.   aspirin-acetaminophen-caffeine (EXCEDRIN MIGRAINE) 250-250-65 MG tablet Take 2-3 tablets by mouth every 6 (six) hours as needed for headache.   buprenorphine-naloxone (SUBOXONE) 2-0.5 mg SUBL SL tablet Place 1-2 tablets See admin instructions under the tongue. Take 2 tablets by mouth in the morning, take 1 tablet by mouth midday and take 2 tablets by mouth at bedtime   diphenoxylate-atropine (LOMOTIL) 2.5-0.025 MG tablet Take 2 tablets by mouth every 6 (six) hours as  needed for diarrhea or loose stools.    ezetimibe (ZETIA) 10 MG tablet TAKE 1 TABLET BY MOUTH ONCE DAILY   furosemide (LASIX) 20 MG tablet Take 20 mg by mouth daily.   glucose blood (ACCU-CHEK GUIDE) test strip Use to check blood sugar daily for type 2 diabetes   lactulose (CHRONULAC) 10 GM/15ML solution TAKE 30 MLS BY MOUTH 3 TIMES DAILY (Patient taking differently: Take 20 g by mouth 3 (three) times daily.)   Lancets Misc. (ACCU-CHEK SOFTCLIX LANCET DEV) KIT Use to check blood sugar daily for type 2 diabetes   levothyroxine (SYNTHROID) 75 MCG tablet TAKE 1 TABLET BY MOUTH ONCE DAILY  Melatonin 10 MG CAPS Take 10 mg at bedtime by mouth.   metFORMIN (GLUCOPHAGE-XR) 500 MG 24 hr tablet TAKE 1 TABLET BY MOUTH ONCE DAILY WITH BREAKFAST (Patient taking differently: Take 500 mg by mouth daily.)   metoprolol tartrate (LOPRESSOR) 25 MG tablet TAKE 1 TABLET BY MOUTH TWICE DAILY WITH FOOD   metroNIDAZOLE (METROCREAM) 0.75 % cream APPLY TOPICALLY AT BEDTIME (Patient taking differently: Apply 1 application topically at bedtime.)   naloxone (NARCAN) nasal spray 4 mg/0.1 mL Place 1 spray into the nose as needed (opioid overdose).   nitroGLYCERIN (NITROSTAT) 0.4 MG SL tablet Place 0.4 mg under the tongue every 5 (five) minutes as needed for chest pain.   nystatin cream (MYCOSTATIN) Apply 1 application 3 (three) times daily as needed topically (for rash).    omeprazole (PRILOSEC) 40 MG capsule Take 1 capsule (40 mg total) by mouth daily.   ondansetron (ZOFRAN-ODT) 4 MG disintegrating tablet Take 4 mg by mouth every 8 (eight) hours as needed for vomiting or nausea.   OXYGEN Inhale into the lungs.   prazosin (MINIPRESS) 5 MG capsule Take 1 capsule (5 mg total) by mouth at bedtime.   QUEtiapine (SEROQUEL) 300 MG tablet Take 1 tablet (300 mg total) by mouth at bedtime.   rifaximin (XIFAXAN) 550 MG TABS tablet Take 550 mg by mouth 2 (two) times daily.   spironolactone (ALDACTONE) 100 MG tablet Take 100 mg by mouth  daily.   tamsulosin (FLOMAX) 0.4 MG CAPS capsule TAKE 1 CAPSULE BY MOUTH ONCE EVERY EVENING (Patient taking differently: Take 0.4 mg by mouth every evening.)   triamcinolone cream (KENALOG) 0.1 % APPLY TOPICALLY TWICE DAILY AS NEEDED (Patient taking differently: Apply 1 application topically 2 (two) times daily as needed (rash).)   venlafaxine XR (EFFEXOR-XR) 150 MG 24 hr capsule Take 1 capsule (150 mg total) by mouth daily. Take with 75 mg capsule to equal 225 mg once daily   venlafaxine XR (EFFEXOR-XR) 75 MG 24 hr capsule Take 1 capsule (75 mg total) by mouth daily. Take with 150 mg dose to equal 225 mg once daily   Vitamin D, Ergocalciferol, (DRISDOL) 1.25 MG (50000 UNIT) CAPS capsule Take 50,000 Units by mouth every Monday.   Facility-Administered Medications Prior to Visit  Medication Dose Route Frequency Provider   ipratropium-albuterol (DUONEB) 0.5-2.5 (3) MG/3ML nebulizer solution 3 mL  3 mL Nebulization Once Chrismon, Vickki Muff, PA-C    Review of Systems  All other systems reviewed and are negative.  {Labs  Heme  Chem  Endocrine  Serology  Results Review (optional):23779}  Objective    There were no vitals taken for this visit. {Show previous vital signs (optional):23777}  Physical Exam  ***  Last depression screening scores PHQ 2/9 Scores 04/27/2021 01/29/2021 11/13/2020  PHQ - 2 Score 3 0 2  PHQ- 9 Score 12 - 9  Some encounter information is confidential and restricted. Go to Review Flowsheets activity to see all data.   Last fall risk screening Fall Risk  01/29/2021  Falls in the past year? 1  Number falls in past yr: 1  Injury with Fall? 0  Risk for fall due to : History of fall(s);Mental status change   Last Audit-C alcohol use screening Alcohol Use Disorder Test (AUDIT) 04/27/2021  1. How often do you have a drink containing alcohol? 0  2. How many drinks containing alcohol do you have on a typical day when you are drinking? 0  3. How often do you have six or  more drinks on one occasion? 0  AUDIT-C Score 0   A score of 3 or more in women, and 4 or more in men indicates increased risk for alcohol abuse, EXCEPT if all of the points are from question 1   No results found for any visits on 07/02/21.  Assessment & Plan    Routine Health Maintenance and Physical Exam  Exercise Activities and Dietary recommendations  Goals   None     Immunization History  Administered Date(s) Administered   Influenza,inj,Quad PF,6+ Mos 08/30/2015, 08/19/2016, 09/02/2017, 08/06/2018   Influenza-Unspecified 10/31/2019   Pneumococcal Polysaccharide-23 10/30/2009   Tdap 10/29/2011    Health Maintenance  Topic Date Due   COVID-19 Vaccine (1) Never done   FOOT EXAM  Never done   HIV Screening  Never done   Zoster Vaccines- Shingrix (1 of 2) Never done   Pneumococcal Vaccine 34-7 Years old (2 - PCV) 10/30/2010   OPHTHALMOLOGY EXAM  04/15/2019   INFLUENZA VACCINE  07/02/2021   HEMOGLOBIN A1C  08/19/2021   TETANUS/TDAP  10/28/2021   COLONOSCOPY (Pts 45-83yr Insurance coverage will need to be confirmed)  05/11/2025   PNEUMOCOCCAL POLYSACCHARIDE VACCINE AGE 31-64 HIGH RISK  Completed   Hepatitis C Screening  Completed   HPV VACCINES  Aged Out    Discussed health benefits of physical activity, and encouraged him to engage in regular exercise appropriate for his age and condition.  ***  No follow-ups on file.     {provider attestation***:1}   DLelon Huh MD  [phone](phone) [phone](fax)  [place]

## 2021-07-18 ENCOUNTER — Other Ambulatory Visit: Payer: Self-pay | Admitting: Family Medicine

## 2021-07-18 DIAGNOSIS — E119 Type 2 diabetes mellitus without complications: Secondary | ICD-10-CM

## 2021-07-18 DIAGNOSIS — I10 Essential (primary) hypertension: Secondary | ICD-10-CM

## 2021-07-18 NOTE — Telephone Encounter (Signed)
Patient will need an office visit for further refills. Requested Prescriptions  Pending Prescriptions Disp Refills  . metoprolol tartrate (LOPRESSOR) 25 MG tablet [Pharmacy Med Name: METOPROLOL TARTRATE 25 MG TAB] 180 tablet 0    Sig: TAKE 1 TABLET BY MOUTH TWICE DAILY WITH FOOD     Cardiovascular:  Beta Blockers Failed - 07/18/2021  6:03 PM      Failed - Last BP in normal range    BP Readings from Last 1 Encounters:  05/31/21 (!) 147/84         Passed - Last Heart Rate in normal range    Pulse Readings from Last 1 Encounters:  05/31/21 80         Passed - Valid encounter within last 6 months    Recent Outpatient Visits          2 months ago Unilateral recurrent inguinal hernia without obstruction or gangrene   [facility] Malva Limes, MD   5 months ago Controlled type 2 diabetes mellitus without complication, without long-term current use of insulin (HCC)   [facility] Malva Limes, MD   8 months ago Bronchitis   [facility] Malva Limes, MD   11 months ago Flu-like symptoms   [facility] Malva Limes, MD   1 year ago Other fatigue   [facility] Malva Limes, MD             . metFORMIN (GLUCOPHAGE-XR) 500 MG 24 hr tablet [Pharmacy Med Name: METFORMIN HCL ER 500 MG TAB] 90 tablet 0    Sig: TAKE 1 TABLET BY MOUTH ONCE DAILY WITH BREAKFAST     Endocrinology:  Diabetes - Biguanides Passed - 07/18/2021  6:03 PM      Passed - Cr in normal range and within 360 days    Creatinine  Date Value Ref Range Status  03/29/2015 1.06 mg/dL Final    Comment:    4.46-6.75 NOTE: New Reference Range  02/07/15    Creatinine, Ser  Date Value Ref Range Status  05/11/2021 0.81 0.61 - 1.24 mg/dL Final         Passed - HBA1C is between 0 and 7.9 and within 180 days    Hemoglobin A1C  Date Value Ref Range Status  02/16/2021 5.9 (A) 4.0 - 5.6 % Final  03/19/2015 5.9 % Final    Comment:     4.0-6.0 NOTE: New Reference Range  02/07/15    Hgb A1c MFr Bld  Date Value Ref Range Status  11/13/2020 7.0 (H) 4.8 - 5.6 % Final    Comment:             Prediabetes: 5.7 - 6.4          Diabetes: >6.4          Glycemic control for adults with diabetes: <7.0          Passed - eGFR in normal range and within 360 days    EGFR (African American)  Date Value Ref Range Status  03/29/2015 >60  Final   GFR calc Af Amer  Date Value Ref Range Status  11/13/2020 102 >59 mL/min/1.73 Final    Comment:    **In accordance with recommendations from the NKF-ASN Task force,**   [facility] is in the process of updating its eGFR calculation to the   2021 CKD-EPI creatinine equation that estimates kidney function   without a race variable.    EGFR (Non-African Amer.)  Date Value Ref Range Status  03/29/2015 >60  Final    Comment:    eGFR values <70mL/min/1.73 m2 may be an indication of chronic kidney disease (CKD). Calculated eGFR is useful in patients with stable renal function. The eGFR calculation will not be reliable in acutely ill patients when serum creatinine is changing rapidly. It is not useful in patients on dialysis. The eGFR calculation may not be applicable to patients at the low and high extremes of body sizes, pregnant women, and vegetarians.    GFR, Estimated  Date Value Ref Range Status  05/11/2021 >60 >60 mL/min Final    Comment:    (NOTE) Calculated using the CKD-EPI Creatinine Equation (2021)          Passed - Valid encounter within last 6 months    Recent Outpatient Visits          2 months ago Unilateral recurrent inguinal hernia without obstruction or gangrene   [facility] Birdie Sons, MD   5 months ago Controlled type 2 diabetes mellitus without complication, without long-term current use of insulin [facility])   [facility] Birdie Sons, MD   8 months ago [place], Donald E, MD   11  months ago Flu-like symptoms   [facility] Birdie Sons, MD   1 year ago Other fatigue   [facility] Birdie Sons, MD
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
## 2021-07-23 ENCOUNTER — Telehealth (INDEPENDENT_AMBULATORY_CARE_PROVIDER_SITE_OTHER): Payer: Medicare Other | Admitting: Psychiatry

## 2021-07-23 ENCOUNTER — Encounter: Payer: Self-pay | Admitting: Psychiatry

## 2021-07-23 ENCOUNTER — Other Ambulatory Visit: Payer: Self-pay

## 2021-07-23 DIAGNOSIS — F4310 Post-traumatic stress disorder, unspecified: Secondary | ICD-10-CM

## 2021-07-23 DIAGNOSIS — F3341 Major depressive disorder, recurrent, in partial remission: Secondary | ICD-10-CM

## 2021-07-23 MED ORDER — PRAZOSIN HCL 5 MG PO CAPS: 5.0000 mg | ORAL_CAPSULE | Freq: Every day | ORAL | 0 refills | Status: DC

## 2021-07-23 MED ORDER — PRAZOSIN HCL 1 MG PO CAPS: ORAL_CAPSULE | ORAL | 0 refills | Status: DC

## 2021-07-23 NOTE — Patient Instructions (Signed)
Continue venlafaxine 225 mg daily Continue quetiapine 300 mg at night Increase prazosin 6 mg at night  Next appointment- 11/14 at 4 PM

## 2021-08-21 ENCOUNTER — Other Ambulatory Visit: Payer: Self-pay | Admitting: Family Medicine

## 2021-08-21 DIAGNOSIS — E039 Hypothyroidism, unspecified: Secondary | ICD-10-CM

## 2021-09-12 ENCOUNTER — Encounter: Payer: Self-pay | Admitting: General Surgery

## 2021-09-20 ENCOUNTER — Other Ambulatory Visit: Payer: Self-pay | Admitting: Family Medicine

## 2021-09-20 DIAGNOSIS — E785 Hyperlipidemia, unspecified: Secondary | ICD-10-CM

## 2021-09-26 ENCOUNTER — Telehealth: Payer: Self-pay

## 2021-09-26 NOTE — Telephone Encounter (Signed)
I recall he states that he was unable to obtain refill I ordered in July when he had a visit with me in August. Could you contact the pharmacy when it was filled. It was ordered for 90 days, so I believe he has enough meds until next visit.

## 2021-09-26 NOTE — Telephone Encounter (Signed)
received fax requesting a refill on the venlafaxine 150mg 

## 2021-10-03 NOTE — Telephone Encounter (Signed)
Last filled 9-20 and he has another refill. Message noted and closed.

## 2021-10-08 ENCOUNTER — Other Ambulatory Visit (HOSPITAL_COMMUNITY): Payer: Self-pay | Admitting: Psychiatry

## 2021-10-08 ENCOUNTER — Telehealth: Payer: Self-pay

## 2021-10-08 DIAGNOSIS — F4310 Post-traumatic stress disorder, unspecified: Secondary | ICD-10-CM

## 2021-10-08 DIAGNOSIS — F3342 Major depressive disorder, recurrent, in full remission: Secondary | ICD-10-CM

## 2021-10-08 MED ORDER — VENLAFAXINE HCL ER 150 MG PO CP24: 150.0000 mg | ORAL_CAPSULE | Freq: Every day | ORAL | 0 refills | Status: AC

## 2021-10-08 MED ORDER — VENLAFAXINE HCL ER 75 MG PO CP24: 75.0000 mg | ORAL_CAPSULE | Freq: Every day | ORAL | 1 refills | Status: AC

## 2021-10-08 NOTE — Telephone Encounter (Signed)
pt needs a refill on the effexor 150mg  and the 75mg .  he is completely out of the 150mg 

## 2021-10-08 NOTE — Telephone Encounter (Signed)
sent 

## 2021-10-09 ENCOUNTER — Other Ambulatory Visit: Payer: Self-pay | Admitting: Family Medicine

## 2021-10-09 DIAGNOSIS — F3342 Major depressive disorder, recurrent, in full remission: Secondary | ICD-10-CM
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
## 2021-10-10 NOTE — Telephone Encounter (Signed)
Requested medications are due for refill today.  yes  Requested medications are on the active medications list.  yes  Last refill. 04/27/2021   #90 1 refill  Future visit scheduled.   no  Notes to clinic.  Medication not delegated.

## 2021-10-15 ENCOUNTER — Encounter: Payer: Self-pay | Admitting: Psychiatry

## 2021-10-15 ENCOUNTER — Telehealth (INDEPENDENT_AMBULATORY_CARE_PROVIDER_SITE_OTHER): Payer: Medicare Other | Admitting: Psychiatry

## 2021-10-15 ENCOUNTER — Other Ambulatory Visit: Payer: Self-pay

## 2021-10-15 DIAGNOSIS — F3341 Major depressive disorder, recurrent, in partial remission: Secondary | ICD-10-CM | POA: Diagnosis not present

## 2021-10-15 DIAGNOSIS — F4310 Post-traumatic stress disorder, unspecified: Secondary | ICD-10-CM

## 2021-10-15 MED ORDER — PRAZOSIN HCL 1 MG PO CAPS: 1.0000 mg | ORAL_CAPSULE | Freq: Every day | ORAL | 1 refills | Status: DC

## 2021-10-15 MED ORDER — PRAZOSIN HCL 5 MG PO CAPS: 5.0000 mg | ORAL_CAPSULE | Freq: Every day | ORAL | 0 refills | Status: AC

## 2021-10-15 NOTE — Patient Instructions (Signed)
Continue venlafaxine 225 mg daily Continue quetiapine 300 mg at night Continue prazosin 6 mg at night  Next appointment- 2/6 at 10:30

## 2021-10-20 ENCOUNTER — Other Ambulatory Visit: Payer: Self-pay | Admitting: Family Medicine

## 2021-10-20 DIAGNOSIS — I10 Essential (primary) hypertension: Secondary | ICD-10-CM

## 2021-10-20 DIAGNOSIS — E119 Type 2 diabetes mellitus without complications: Secondary | ICD-10-CM

## 2021-12-06 ENCOUNTER — Other Ambulatory Visit: Payer: Self-pay | Admitting: Family Medicine
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
## 2022-01-07 ENCOUNTER — Ambulatory Visit: Payer: Medicare Other | Admitting: Psychiatry

## 2022-02-13 ENCOUNTER — Other Ambulatory Visit: Payer: Self-pay | Admitting: Family Medicine

## 2022-02-13 ENCOUNTER — Telehealth: Payer: Self-pay | Admitting: Psychiatry

## 2022-02-13 DIAGNOSIS — E119 Type 2 diabetes mellitus without complications: Secondary | ICD-10-CM

## 2022-02-13 DIAGNOSIS — I10 Essential (primary) hypertension: Secondary | ICD-10-CM

## 2022-02-13 NOTE — Telephone Encounter (Signed)
Ordered refill of prazosin per request. Please contact the patient to make follow up appointment. I will not be able to prescribe any more refills without evaluation. ?

## 2022-02-21 ENCOUNTER — Ambulatory Visit: Payer: Self-pay | Admitting: *Deleted

## 2022-02-21 NOTE — Telephone Encounter (Signed)
3rd attempt to contact patient. Attempted [phone]. No answer, LVMTCB [phone] ?

## 2022-02-21 NOTE — Telephone Encounter (Signed)
letter was mailed out to contact our office to set up an appt.[name]  ?

## 2022-02-21 NOTE — Telephone Encounter (Signed)
Summary: leg pain on both legs for about a week now.  ? Pt stated he has been experiencing leg pain on both legs for about a week now. Pt stated pain is moderate however, it does hurt to walk.  ? ?Pt denied swelling.  ? ?No appointments for today.  ? ? ?Seeking clinical advice  ?  ? ? ?2nd attempt to contact patient to review sx of leg pain. No answer. LVMTCB [phone]. ?

## 2022-02-21 NOTE — Telephone Encounter (Signed)
Duplicate request to call patient. See previous encounter trying to call on different phone #. ?

## 2022-02-21 NOTE — Telephone Encounter (Signed)
left messages on both phone #s' to call office back to set up an appt ?

## 2022-02-21 NOTE — Telephone Encounter (Signed)
I attempted to return his call.   C/o bilateral leg pain for a week    It hurts to walk.   Denies swelling. ?Message left to call back. ?

## 2022-02-21 NOTE — Telephone Encounter (Signed)
?  Chief Complaint: bilateral leg pain ?Symptoms: mostly when walking or lying down ?Frequency: started one week ago ?Pertinent Negatives: Patient denies swelling ?Disposition: '[]'$ ED /'[]'$ Urgent Care (no appt availability in office) / '[x]'$ Appointment(In office/virtual)/ '[]'$  [facility]/ '[]'$ Home Care/ '[]'$ Refused Recommended Disposition /'[]'$ [facility]/ '[]'$  Follow-up with PCP ?Additional Notes: I urged pt to go to UC but he refused. I could have an appt for him this afternoon but he will only see Dr. [name]. Pt cautioned to call back for worsening or changes in symptoms or go to UC/ED. ? ? ?Reason for Disposition ? [1] SEVERE pain (e.g., excruciating, unable to do any normal activities) AND [2] not improved after 2 hours of pain medicine ? ?Answer Assessment - Initial Assessment Questions ?1. ONSET: "When did the pain start?"  ?    Front and back of legs and the heels ?2. LOCATION: "Where is the pain located?"  ?    Anterior and posterior both legs ?3. PAIN: "How bad is the pain?"    (Scale 1-10; or mild, moderate, severe) ?  -  MILD (1-3): doesn't interfere with normal activities  ?  -  MODERATE (4-7): interferes with normal activities (e.g., work or school) or awakens from sleep, limping  ?  -  SEVERE (8-10): excruciating pain, unable to do any normal activities, unable to walk ?    7 ?4. WORK OR EXERCISE: "Has there been any recent work or exercise that involved this part of the body?"  ?    Unknown started a week ago ?5. CAUSE: "What do you think is causing the leg pain?" ?    He has no idea, has stage 4 cirrhosis of the liver, Non Alcohol ?6. OTHER SYMPTOMS: "Do you have any other symptoms?" (e.g., chest pain, back pain, breathing difficulty, swelling, rash, fever, numbness, weakness) ?    No swelling, no breathing difficulty ?7. PREGNANCY: "Is there any chance you are pregnant?" "When was your last menstrual period?" ?    na ? ?Protocols used: Leg Pain-A-AH ? ?

## 2022-02-21 NOTE — Telephone Encounter (Signed)
Summary: [phone]  ? Pt's wife called in sates, her number keeps being called and she can't talk about situation, of  leg pain on both legs for about a week now.  ?   ?Pt stated he has been experiencing leg pain on both legs for about a week now. Pt stated pain is moderate however, it does hurt to walk. Please call pt back at his number . [phone]   ?  ? ?Called patient of [phone] to review sx. No answer, LVMTCB.  ?

## 2022-02-26 NOTE — Telephone Encounter (Signed)
Patient has appointment tomorrow

## 2022-02-27 ENCOUNTER — Encounter: Payer: Self-pay | Admitting: Family Medicine

## 2022-02-27 ENCOUNTER — Ambulatory Visit (INDEPENDENT_AMBULATORY_CARE_PROVIDER_SITE_OTHER): Payer: Medicare Other | Admitting: Family Medicine

## 2022-02-27 ENCOUNTER — Other Ambulatory Visit: Payer: Self-pay

## 2022-02-27 VITALS — BP 143/74 | HR 98 | Temp 98.0°F | Resp 16 | Wt 215.4 lb

## 2022-02-27 DIAGNOSIS — E785 Hyperlipidemia, unspecified: Secondary | ICD-10-CM

## 2022-02-27 DIAGNOSIS — Z125 Encounter for screening for malignant neoplasm of prostate: Secondary | ICD-10-CM

## 2022-02-27 DIAGNOSIS — F5101 Primary insomnia: Secondary | ICD-10-CM

## 2022-02-27 DIAGNOSIS — M79604 Pain in right leg: Secondary | ICD-10-CM

## 2022-02-27 DIAGNOSIS — M79605 Pain in left leg: Secondary | ICD-10-CM

## 2022-02-27 DIAGNOSIS — M5416 Radiculopathy, lumbar region: Secondary | ICD-10-CM

## 2022-02-27 DIAGNOSIS — I10 Essential (primary) hypertension: Secondary | ICD-10-CM

## 2022-02-27 DIAGNOSIS — F3342 Major depressive disorder, recurrent, in full remission: Secondary | ICD-10-CM

## 2022-02-27 DIAGNOSIS — E119 Type 2 diabetes mellitus without complications: Secondary | ICD-10-CM

## 2022-02-27 DIAGNOSIS — E039 Hypothyroidism, unspecified: Secondary | ICD-10-CM

## 2022-02-27 MED ORDER — PREDNISONE 10 MG PO TABS: ORAL_TABLET | ORAL | 0 refills | Status: AC

## 2022-02-27 MED ORDER — QUETIAPINE FUMARATE 300 MG PO TABS: ORAL_TABLET | ORAL | 1 refills | Status: DC

## 2022-02-27 NOTE — Patient Instructions (Signed)
Please review the attached list of medications and notify my office if there are any errors.   Please go to the lab draw station in [address] on the second floor of [facility]  when you are fasting for 8 hours. Normal hours are 8:00am to 11:30am and 1:00pm to 4:00pm Monday through Friday     

## 2022-02-27 NOTE — Progress Notes (Signed)
?  ? ?I,Sulibeya S Dimas,acting as a scribe for Lelon Huh, MD.,have documented all relevant documentation on the behalf of Lelon Huh, MD,as directed by  Lelon Huh, MD while in the presence of Lelon Huh, MD. ? ? ?Established patient visit ? ? ?Patient: Ray Robles   DOB: [birth year]   61 y.o. Male  MRN: [id number] ?Visit Date: 02/27/2022 ? ?Today's healthcare provider: Lelon Huh, MD  ? ?No chief complaint on file. ? ?Subjective  ?  ?Leg Pain  ?Incident onset: 2 weeks ago. There was no injury mechanism. The pain is present in the left leg and right leg. The pain is at a severity of 7/10. The pain has been Constant since onset. Associated symptoms include an inability to bear weight, a loss of motion, a loss of sensation, muscle weakness, numbness and tingling. Exacerbated by: walking or lying down. He has tried NSAIDs for the symptoms. The treatment provided no relief.   ?Pain keeps him up at night. Does have extensive history of vertebral and lumbar disk disease.  ? ?He is also overdue for follow up diabetes, hypertension, and lipids.  ?Lab Results  ?Component Value Date  ? CHOL 180 11/13/2020  ? HDL 35 (L) 11/13/2020  ? LDLCALC 119 (H) 11/13/2020  ? TRIG 144 11/13/2020  ? CHOLHDL 5.1 (H) 11/13/2020  ?  ?Lab Results  ?Component Value Date  ? HGBA1C 5.9 (A) 02/16/2021  ? ?Lab Results  ?Component Value Date  ? NA 137 05/11/2021  ? K 4.0 05/11/2021  ? CO2 25 05/11/2021  ? GLUCOSE 134 (H) 05/11/2021  ? BUN 8 05/11/2021  ? CREATININE 0.81 05/11/2021  ? CALCIUM 9.1 05/11/2021  ? GFRNONAA >60 05/11/2021  ? ? ?He also has long history of insomnia and Seroquel has worked well, however he occasionally has to take an extra 1/2 table which is effective, but runs out of medication early.  ? ?Medications: ?Outpatient Medications Prior to Visit  ?Medication Sig  ? Accu-Chek Softclix Lancets lancets Use to check blood sugar daily for type 2 diabetes  ? albuterol (VENTOLIN HFA) 108 (90 Base) MCG/ACT inhaler  Inhale 2 puffs into the lungs every 6 (six) hours as needed for wheezing or shortness of breath.  ? aspirin-acetaminophen-caffeine (EXCEDRIN MIGRAINE) 250-250-65 MG tablet Take 2-3 tablets by mouth every 6 (six) hours as needed for headache.  ? buprenorphine-naloxone (SUBOXONE) 2-0.5 mg SUBL SL tablet Place 1-2 tablets See admin instructions under the tongue. Take 2 tablets by mouth in the morning, take 1 tablet by mouth midday and take 2 tablets by mouth at bedtime  ? diphenoxylate-atropine (LOMOTIL) 2.5-0.025 MG tablet Take 2 tablets by mouth every 6 (six) hours as needed for diarrhea or loose stools.   ? ezetimibe (ZETIA) 10 MG tablet TAKE 1 TABLET BY MOUTH ONCE DAILY  ? furosemide (LASIX) 20 MG tablet Take 20 mg by mouth daily.  ? glucose blood (ACCU-CHEK GUIDE) test strip Use to check blood sugar daily for type 2 diabetes  ? lactulose (CHRONULAC) 10 GM/15ML solution TAKE 30 MLS BY MOUTH 3 TIMES DAILY (Patient taking differently: Take 20 g by mouth 3 (three) times daily.)  ? Lancets Misc. (ACCU-CHEK SOFTCLIX LANCET DEV) KIT Use to check blood sugar daily for type 2 diabetes  ? levothyroxine (SYNTHROID) 75 MCG tablet TAKE 1 TABLET BY MOUTH ONCE DAILY  ? Melatonin 10 MG CAPS Take 10 mg at bedtime by mouth.  ? metFORMIN (GLUCOPHAGE-XR) 500 MG 24 hr tablet TAKE 1 TABLET BY MOUTH ONCE DAILY  WITH BREAKFAST  ? metoprolol tartrate (LOPRESSOR) 25 MG tablet TAKE 1 TABLET BY MOUTH TWICE DAILY WITH FOOD  ? metroNIDAZOLE (METROCREAM) 0.75 % cream APPLY TOPICALLY AT BEDTIME (Patient taking differently: Apply 1 application topically at bedtime.)  ? naloxone (NARCAN) nasal spray 4 mg/0.1 mL Place 1 spray into the nose as needed (opioid overdose).  ? nitroGLYCERIN (NITROSTAT) 0.4 MG SL tablet Place 0.4 mg under the tongue every 5 (five) minutes as needed for chest pain.  ? nystatin cream (MYCOSTATIN) Apply 1 application 3 (three) times daily as needed topically (for rash).   ? omeprazole (PRILOSEC) 40 MG capsule TAKE 1 CAPSULE  BY MOUTH ONCE DAILY  ? ondansetron (ZOFRAN-ODT) 4 MG disintegrating tablet Take 4 mg by mouth every 8 (eight) hours as needed for vomiting or nausea.  ? OXYGEN Inhale into the lungs.  ? prazosin (MINIPRESS) 1 MG capsule Take 1 capsule (1 mg total) by mouth at bedtime.  ? prazosin (MINIPRESS) 5 MG capsule Take 1 capsule (5 mg total) by mouth at bedtime.  ? QUEtiapine (SEROQUEL) 300 MG tablet TAKE 1 TABLET BY MOUTH AT BEDTIME  ? rifaximin (XIFAXAN) 550 MG TABS tablet Take 550 mg by mouth 2 (two) times daily.  ? spironolactone (ALDACTONE) 100 MG tablet Take 100 mg by mouth daily.  ? tamsulosin (FLOMAX) 0.4 MG CAPS capsule Take 1 capsule (0.4 mg total) by mouth every evening.  ? triamcinolone cream (KENALOG) 0.1 % APPLY TOPICALLY TWICE DAILY AS NEEDED (Patient taking differently: Apply 1 application topically 2 (two) times daily as needed (rash).)  ? venlafaxine XR (EFFEXOR-XR) 150 MG 24 hr capsule Take 1 capsule (150 mg total) by mouth daily. Take with 75 mg capsule to equal 225 mg once daily  ? venlafaxine XR (EFFEXOR-XR) 75 MG 24 hr capsule Take 1 capsule (75 mg total) by mouth daily. Take with 150 mg dose to equal 225 mg once daily  ? Vitamin D, Ergocalciferol, (DRISDOL) 1.25 MG (50000 UNIT) CAPS capsule Take 50,000 Units by mouth every Monday.  ? ?Facility-Administered Medications Prior to Visit  ?Medication Dose Route Frequency Provider  ? ipratropium-albuterol (DUONEB) 0.5-2.5 (3) MG/3ML nebulizer solution 3 mL  3 mL Nebulization Once Chrismon, Vickki Muff, PA-C  ? ? ?Review of Systems  ?Constitutional:  Positive for fever.  ?Respiratory:  Positive for shortness of breath.   ?Cardiovascular:  Negative for chest pain.  ?Musculoskeletal:  Positive for back pain, gait problem and myalgias.  ?Neurological:  Positive for tingling, weakness and numbness.  ? ? ?  Objective  ?  ?BP (!) 143/74 (BP Location: Left Arm, Patient Position: Sitting, Cuff Size: Large)   Pulse 98   Temp 98 ?F (36.7 ?C) (Oral)   Resp 16   Wt 215  lb 6.4 oz (97.7 kg)   BMI 30.47 kg/m?  ? ? ?Physical Exam  ? ? ?General: Appearance:    Mildly obese male in no acute distress  ?Eyes:    PERRL, conjunctiva/corneas clear, EOM's intact       ?Lungs:     Clear to auscultation bilaterally, respirations unlabored  ?Heart:    Normal heart rate. Normal rhythm. No murmurs, rubs, or gallops.    ?MS:   All extremities are intact.  +2 pedal pulses.   ?Neurologic:   Awake, alert, oriented x 3. Slightly diminished LE strength. +2 DTRs at knees, +1 of ankles.   ?   ?  ? Assessment & Plan  ?  ? ? ?1. Pain in both lower extremities ?Likely  secondary to lumbar radiculopathy as below.  ? ?- CK (Creatine Kinase) ?- Magnesium ?- Sed Rate (ESR) ? ?2. Lumbar radiculopathy ? ?- predniSONE (DELTASONE) 10 MG tablet; 6 tablets for 2 days, then 5 for 2 days, then 4 for 2 days, then 3 for 2 days, then 2 for 2 days, then 1 for 2 days.  Dispense: 42 tablet; Refill: 0 ? ?3. Prostate cancer screening ? ?- PSA Total (Reflex To Free) ([facility] only) ? ?4. Primary hypertension ?Mildly elevated systolic BP. Work on [employer] and exercise.  ? ?5. Hypothyroidism, unspecified type ? ?- T4, free ?- TSH ? ?6. Controlled type 2 diabetes mellitus without complication, without long-term current use of insulin ([place]) ?Doing well current medications.  ?- Hemoglobin A1c ? ?7. Hyperlipidemia, unspecified hyperlipidemia type ? ?- CBC ?- Comprehensive metabolic panel ?- Lipid panel ? ?8. MDD (major depressive disorder), recurrent, in full remission ([place]) ?Continue current medications.  ? ?9. Primary insomnia ?Doing well with Seroquel but occasionally needs to take additional 1/2 tablet.  ?   ? ?The entirety of the information documented in the History of Present Illness, Review of Systems and Physical Exam were personally obtained by me. Portions of this information were initially documented by the CMA and reviewed by me for thoroughness and accuracy.   ? ? ?Lelon Huh, MD  ?[facility] ?[phone] (phone) ?[phone] (fax) ? ?[facility]  ?

## 2022-03-06 ENCOUNTER — Other Ambulatory Visit: Payer: Self-pay | Admitting: Psychiatry

## 2022-03-06 ENCOUNTER — Other Ambulatory Visit: Payer: Self-pay | Admitting: Family Medicine

## 2022-03-06 DIAGNOSIS — E785 Hyperlipidemia, unspecified: Secondary | ICD-10-CM

## 2022-03-06 DIAGNOSIS — F4310 Post-traumatic stress disorder, unspecified: Secondary | ICD-10-CM

## 2022-05-08 ENCOUNTER — Other Ambulatory Visit: Payer: Self-pay | Admitting: Family Medicine

## 2022-05-08 DIAGNOSIS — E119 Type 2 diabetes mellitus without complications: Secondary | ICD-10-CM

## 2022-05-08 DIAGNOSIS — I10 Essential (primary) hypertension: Secondary | ICD-10-CM

## 2022-05-08 NOTE — Telephone Encounter (Signed)
Patient called, left VM to return the call to the office to scheduled an appt for medication refill request.   

## 2022-05-08 NOTE — Telephone Encounter (Signed)
Requested medication (s) are due for refill today: yes  Requested medication (s) are on the active medication list: yes  Last refill:  02/13/22  Future visit scheduled: no  Notes to clinic:  pt is due for appt and updated labs. Pt called, LVMTCB     Requested Prescriptions  Pending Prescriptions Disp Refills   metFORMIN (GLUCOPHAGE-XR) 500 MG 24 hr tablet [Pharmacy Med Name: METFORMIN HCL ER 500 MG TAB] 30 tablet 0    Sig: TAKE 1 TABLET BY MOUTH ONCE DAILY WITH BREAKFAST     Endocrinology:  Diabetes - Biguanides Failed - 05/08/2022  4:52 PM      Failed - Cr in normal range and within 360 days    Creatinine  Date Value Ref Range Status  03/29/2015 1.06 mg/dL Final    Comment:    0.61-1.24 NOTE: New Reference Range  02/07/15    Creatinine, Ser  Date Value Ref Range Status  05/11/2021 0.81 0.61 - 1.24 mg/dL Final         Failed - HBA1C is between 0 and 7.9 and within 180 days    Hemoglobin A1C  Date Value Ref Range Status  02/16/2021 5.9 (A) 4.0 - 5.6 % Final  03/19/2015 5.9 % Final    Comment:    4.0-6.0 NOTE: New Reference Range  02/07/15    Hgb A1c MFr Bld  Date Value Ref Range Status  11/13/2020 7.0 (H) 4.8 - 5.6 % Final    Comment:             Prediabetes: 5.7 - 6.4          Diabetes: >6.4          Glycemic control for adults with diabetes: <7.0          Failed - eGFR in normal range and within 360 days    EGFR (African American)  Date Value Ref Range Status  03/29/2015 >60  Final   GFR calc Af Amer  Date Value Ref Range Status  11/13/2020 102 >59 mL/min/1.73 Final    Comment:    **In accordance with recommendations from the NKF-ASN Task force,**   [facility] is in the process of updating its eGFR calculation to the   2021 CKD-EPI creatinine equation that estimates kidney function   without a race variable.    EGFR (Non-African Amer.)  Date Value Ref Range Status  03/29/2015 >60  Final    Comment:    eGFR values <35m/min/1.73 m2 may be an  indication of chronic kidney disease (CKD). Calculated eGFR is useful in patients with stable renal function. The eGFR calculation will not be reliable in acutely ill patients when serum creatinine is changing rapidly. It is not useful in patients on dialysis. The eGFR calculation may not be applicable to patients at the low and high extremes of body sizes, pregnant women, and vegetarians.    GFR, Estimated  Date Value Ref Range Status  05/11/2021 >60 >60 mL/min Final    Comment:    (NOTE) Calculated using the CKD-EPI Creatinine Equation (2021)          Failed - B12 Level in normal range and within 720 days    Vitamin B-12  Date Value Ref Range Status  10/15/2018 825 232 - 1,245 pg/mL Final         Failed - CBC within normal limits and completed in the last 12 months    WBC  Date Value Ref Range Status  05/11/2021 4.2 4.0 - 10.5  K/uL Final   RBC  Date Value Ref Range Status  05/11/2021 4.23 4.22 - 5.81 MIL/uL Final   Hemoglobin  Date Value Ref Range Status  05/11/2021 12.4 (L) 13.0 - 17.0 g/dL Final  11/13/2020 12.1 (L) 13.0 - 17.7 g/dL Final   HCT  Date Value Ref Range Status  05/11/2021 36.9 (L) 39.0 - 52.0 % Final   Hematocrit  Date Value Ref Range Status  11/13/2020 36.0 (L) 37.5 - 51.0 % Final   MCHC  Date Value Ref Range Status  05/11/2021 33.6 30.0 - 36.0 g/dL Final   [facility]  Date Value Ref Range Status  05/11/2021 29.3 26.0 - 34.0 pg Final   MCV  Date Value Ref Range Status  05/11/2021 87.2 80.0 - 100.0 fL Final  11/13/2020 86 79 - 97 fL Final  03/29/2015 91 80 - 100 fL Final   No results found for: PLTCOUNTKUC, LABPLAT, POCPLA RDW  Date Value Ref Range Status  05/11/2021 12.9 11.5 - 15.5 % Final  11/13/2020 13.3 11.6 - 15.4 % Final  03/29/2015 13.7 11.5 - 14.5 % Final         Passed - Valid encounter within last 6 months    Recent Outpatient Visits           2 months ago Pain in both lower extremities   [facility]  Birdie Sons, MD   1 year ago Unilateral recurrent inguinal hernia without obstruction or gangrene   [facility] Birdie Sons, MD   1 year ago Controlled type 2 diabetes mellitus without complication, without long-term current use of insulin [facility])   [facility] Birdie Sons, MD   1 year ago , Donald E, MD   1 year ago Flu-like symptoms   [place], Kirstie Peri, MD                metoprolol tartrate (LOPRESSOR) 25 MG tablet [Pharmacy Med Name: METOPROLOL TARTRATE 25 MG TAB] 60 tablet 0    Sig: TAKE 1 TABLET BY MOUTH TWICE DAILY WITH FOOD     Cardiovascular:  Beta Blockers Failed - 05/08/2022  4:52 PM      Failed - Last BP in normal range    BP Readings from Last 1 Encounters:  02/27/22 (!) 143/74         Passed - Last Heart Rate in normal range    Pulse Readings from Last 1 Encounters:  02/27/22 98         Passed - Valid encounter within last 6 months    Recent Outpatient Visits           2 months ago Pain in both lower extremities   [facility] Birdie Sons, MD   1 year ago Unilateral recurrent inguinal hernia without obstruction or gangrene   [facility] Birdie Sons, MD   1 year ago Controlled type 2 diabetes mellitus without complication, without long-term current use of insulin [facility])   [facility] Birdie Sons, MD   1 year ago [place], Donald E, MD   1 year ago Flu-like symptoms   [place], Kirstie Peri, MD

## 2022-05-09 NOTE — Telephone Encounter (Signed)
LM for patient to come and have labs done that were ordered back in March.

## 2022-05-14 NOTE — Telephone Encounter (Signed)
Left second message to return call to office to schedule follow up appt and labs from back in March.

## 2022-06-11 ENCOUNTER — Other Ambulatory Visit: Payer: Self-pay | Admitting: Psychiatry

## 2022-06-11 DIAGNOSIS — F4310 Post-traumatic stress disorder, unspecified: Secondary | ICD-10-CM

## 2022-06-17 ENCOUNTER — Ambulatory Visit: Payer: Medicare Other | Admitting: Family Medicine

## 2022-07-23 NOTE — Progress Notes (Deleted)
Established patient visit   Patient: Ray Robles   DOB: December 07, 1960   61 y.o. Male  MRN: [id number] Visit Date: 07/24/2022  Today's healthcare provider: Lelon Huh, MD   No chief complaint on file.  Subjective    HPI  ***  Medications: Outpatient Medications Prior to Visit  Medication Sig   Accu-Chek Softclix Lancets lancets Use to check blood sugar daily for type 2 diabetes   albuterol (VENTOLIN HFA) 108 (90 Base) MCG/ACT inhaler Inhale 2 puffs into the lungs every 6 (six) hours as needed for wheezing or shortness of breath.   aspirin-acetaminophen-caffeine (EXCEDRIN MIGRAINE) 250-250-65 MG tablet Take 2-3 tablets by mouth every 6 (six) hours as needed for headache.   buprenorphine-naloxone (SUBOXONE) 2-0.5 mg SUBL SL tablet Place 1-2 tablets See admin instructions under the tongue. Take 2 tablets by mouth in the morning, take 1 tablet by mouth midday and take 2 tablets by mouth at bedtime   diphenoxylate-atropine (LOMOTIL) 2.5-0.025 MG tablet Take 2 tablets by mouth every 6 (six) hours as needed for diarrhea or loose stools.    ezetimibe (ZETIA) 10 MG tablet TAKE 1 TABLET BY MOUTH ONCE DAILY   furosemide (LASIX) 20 MG tablet Take 20 mg by mouth daily.   glucose blood (ACCU-CHEK GUIDE) test strip Use to check blood sugar daily for type 2 diabetes   lactulose (CHRONULAC) 10 GM/15ML solution TAKE 30 MLS BY MOUTH 3 TIMES DAILY (Patient taking differently: Take 20 g by mouth 3 (three) times daily.)   Lancets Misc. (ACCU-CHEK SOFTCLIX LANCET DEV) KIT Use to check blood sugar daily for type 2 diabetes   levothyroxine (SYNTHROID) 75 MCG tablet TAKE 1 TABLET BY MOUTH ONCE DAILY   Melatonin 10 MG CAPS Take 10 mg at bedtime by mouth.   metFORMIN (GLUCOPHAGE-XR) 500 MG 24 hr tablet TAKE 1 TABLET BY MOUTH ONCE DAILY WITH BREAKFAST   metoprolol tartrate (LOPRESSOR) 25 MG tablet TAKE 1 TABLET BY MOUTH TWICE DAILY WITH FOOD   metroNIDAZOLE (METROCREAM) 0.75 % cream APPLY TOPICALLY AT  BEDTIME (Patient taking differently: Apply 1 application. topically at bedtime.)   naloxone (NARCAN) nasal spray 4 mg/0.1 mL Place 1 spray into the nose as needed (opioid overdose).   nitroGLYCERIN (NITROSTAT) 0.4 MG SL tablet Place 0.4 mg under the tongue every 5 (five) minutes as needed for chest pain.   nystatin cream (MYCOSTATIN) Apply 1 application 3 (three) times daily as needed topically (for rash).    omeprazole (PRILOSEC) 40 MG capsule TAKE 1 CAPSULE BY MOUTH ONCE DAILY   ondansetron (ZOFRAN-ODT) 4 MG disintegrating tablet Take 4 mg by mouth every 8 (eight) hours as needed for vomiting or nausea.   OXYGEN Inhale into the lungs.   prazosin (MINIPRESS) 1 MG capsule Take 1 capsule (1 mg total) by mouth at bedtime.   prazosin (MINIPRESS) 5 MG capsule Take 1 capsule (5 mg total) by mouth at bedtime.   QUEtiapine (SEROQUEL) 300 MG tablet Take 1 or 1 1/2 tablet at bedtime as needed   rifaximin (XIFAXAN) 550 MG TABS tablet Take 550 mg by mouth 2 (two) times daily.   spironolactone (ALDACTONE) 100 MG tablet Take 100 mg by mouth daily.   tamsulosin (FLOMAX) 0.4 MG CAPS capsule Take 1 capsule (0.4 mg total) by mouth every evening.   triamcinolone cream (KENALOG) 0.1 % APPLY TOPICALLY TWICE DAILY AS NEEDED (Patient taking differently: Apply 1 application. topically 2 (two) times daily as needed (rash).)   venlafaxine XR (EFFEXOR-XR) 150 MG  24 hr capsule Take 1 capsule (150 mg total) by mouth daily. Take with 75 mg capsule to equal 225 mg once daily   venlafaxine XR (EFFEXOR-XR) 75 MG 24 hr capsule Take 1 capsule (75 mg total) by mouth daily. Take with 150 mg dose to equal 225 mg once daily   Vitamin D, Ergocalciferol, (DRISDOL) 1.25 MG (50000 UNIT) CAPS capsule Take 50,000 Units by mouth every Monday.   No facility-administered medications prior to visit.    Review of Systems  {Labs  Heme  Chem  Endocrine  Serology  Results Review (optional):23779}   Objective    There were no vitals  taken for this visit. {Show previous vital signs (optional):23777}  Physical Exam  ***  No results found for any visits on 07/24/22.  Assessment & Plan     ***  No follow-ups on file.      {provider attestation***:1}   Lelon Huh, MD  [facility] [phone] (phone) [phone] (fax)  [place]

## 2022-07-24 ENCOUNTER — Ambulatory Visit: Payer: Medicare Other | Admitting: Family Medicine

## 2022-08-05 ENCOUNTER — Other Ambulatory Visit: Payer: Self-pay | Admitting: Family Medicine

## 2022-08-05 DIAGNOSIS — F5101 Primary insomnia: Secondary | ICD-10-CM

## 2022-08-05 DIAGNOSIS — F3342 Major depressive disorder, recurrent, in full remission: Secondary | ICD-10-CM

## 2022-08-26 ENCOUNTER — Ambulatory Visit: Payer: Medicare Other | Admitting: Family Medicine

## 2022-09-05 ENCOUNTER — Other Ambulatory Visit: Payer: Self-pay | Admitting: Family Medicine

## 2022-09-05 DIAGNOSIS — E785 Hyperlipidemia, unspecified: Secondary | ICD-10-CM

## 2022-09-09 ENCOUNTER — Ambulatory Visit (INDEPENDENT_AMBULATORY_CARE_PROVIDER_SITE_OTHER): Payer: Medicare Other | Admitting: Family Medicine

## 2022-09-09 VITALS — BP 145/74 | HR 91 | Temp 98.0°F | Wt 233.0 lb

## 2022-09-09 DIAGNOSIS — Z125 Encounter for screening for malignant neoplasm of prostate: Secondary | ICD-10-CM

## 2022-09-09 DIAGNOSIS — I10 Essential (primary) hypertension: Secondary | ICD-10-CM

## 2022-09-09 DIAGNOSIS — E119 Type 2 diabetes mellitus without complications: Secondary | ICD-10-CM | POA: Diagnosis not present

## 2022-09-09 DIAGNOSIS — E039 Hypothyroidism, unspecified: Secondary | ICD-10-CM | POA: Diagnosis not present

## 2022-09-09 DIAGNOSIS — E785 Hyperlipidemia, unspecified: Secondary | ICD-10-CM | POA: Diagnosis not present

## 2022-09-09 DIAGNOSIS — R1310 Dysphagia, unspecified: Secondary | ICD-10-CM

## 2022-09-09 NOTE — Progress Notes (Unsigned)
Established patient visit   Patient: Ray Robles   DOB: [birth year]   61 y.o. Male  MRN: [id number] Visit Date: 09/09/2022  Today's healthcare provider: Lelon Huh, MD   No chief complaint on file.  Subjective    HPI  Diabetes Mellitus Type II, Follow-up  Lab Results  Component Value Date   HGBA1C 5.9 (A) 02/16/2021   HGBA1C 7.0 (H) 11/13/2020   HGBA1C 6.6 (A) 04/28/2020   Wt Readings from Last 3 Encounters:  09/09/22 233 lb (105.7 kg)  02/27/22 215 lb 6.4 oz (97.7 kg)  05/31/21 201 lb (91.2 kg)   Last seen for diabetes 6 months ago.  Management since then includes ordering labs; patient did not complete blood work. He reports good compliance with treatment. He is not having side effects.  Symptoms: No fatigue No foot ulcerations  No appetite changes No nausea  Yes paresthesia of the feet  No polydipsia  No polyuria Yes visual disturbances   No vomiting     Home blood sugar records:  Patient states he checks it but doesn't know what they have been.  Episodes of hypoglycemia? No    Current insulin regiment: N/A Most Recent Eye Exam: Patient is due and will have his wife make an appointment Current exercise: walking Current diet habits: healthy  Pertinent Labs: Lab Results  Component Value Date   CHOL 180 11/13/2020   HDL 35 (L) 11/13/2020   LDLCALC 119 (H) 11/13/2020   TRIG 144 11/13/2020   CHOLHDL 5.1 (H) 11/13/2020   Lab Results  Component Value Date   NA 137 05/11/2021   K 4.0 05/11/2021   CREATININE 0.81 05/11/2021   GFRNONAA >60 05/11/2021     ---------------------------------------------------------------------------------------------------   Hypertension, follow-up  BP Readings from Last 3 Encounters:  09/09/22 (!) 145/74  02/27/22 (!) 143/74  05/31/21 (!) 147/84   Wt Readings from Last 3 Encounters:  09/09/22 233 lb (105.7 kg)  02/27/22 215 lb 6.4 oz (97.7 kg)  05/31/21 201 lb (91.2 kg)     He was last seen for  hypertension 6 months ago.  BP at that visit was 143/74. Management since that visit includes ordering labs.  He reports good compliance with treatment. He is not having side effects.  He is following a Regular diet. He is not exercising. He does not smoke.  Use of agents associated with hypertension: none.   Outside blood pressures are being checked but does not have the readings. Symptoms: No chest pain No chest pressure  No palpitations No syncope  No dyspnea No orthopnea  No paroxysmal nocturnal dyspnea Yes lower extremity edema   Pertinent labs Lab Results  Component Value Date   CHOL 180 11/13/2020   HDL 35 (L) 11/13/2020   LDLCALC 119 (H) 11/13/2020   TRIG 144 11/13/2020   CHOLHDL 5.1 (H) 11/13/2020   Lab Results  Component Value Date   NA 137 05/11/2021   K 4.0 05/11/2021   CREATININE 0.81 05/11/2021   GFRNONAA >60 05/11/2021   GLUCOSE 134 (H) 05/11/2021   TSH 0.801 11/13/2020     The 10-year ASCVD risk score (Arnett DK, et al., 2019) is: 29.8%  ---------------------------------------------------------------------------------------------------  Patient complains that he is having difficulty swallowing and getting choked.  He states he had this esophagus dilated in the past and would like referral to GI for another evaluation.  Patient had multiple labs ordered in March but did not have them done.    Medications: Outpatient Medications  Prior to Visit  Medication Sig   Accu-Chek Softclix Lancets lancets Use to check blood sugar daily for type 2 diabetes   albuterol (VENTOLIN HFA) 108 (90 Base) MCG/ACT inhaler Inhale 2 puffs into the lungs every 6 (six) hours as needed for wheezing or shortness of breath.   aspirin-acetaminophen-caffeine (EXCEDRIN MIGRAINE) 250-250-65 MG tablet Take 2-3 tablets by mouth every 6 (six) hours as needed for headache.   buprenorphine-naloxone (SUBOXONE) 2-0.5 mg SUBL SL tablet Place 1-2 tablets See admin instructions under the  tongue. Take 2 tablets by mouth in the morning, take 1 tablet by mouth midday and take 2 tablets by mouth at bedtime   diphenoxylate-atropine (LOMOTIL) 2.5-0.025 MG tablet Take 2 tablets by mouth every 6 (six) hours as needed for diarrhea or loose stools.    ezetimibe (ZETIA) 10 MG tablet TAKE 1 TABLET BY MOUTH ONCE DAILY   furosemide (LASIX) 20 MG tablet Take 20 mg by mouth daily.   glucose blood (ACCU-CHEK GUIDE) test strip Use to check blood sugar daily for type 2 diabetes   lactulose (CHRONULAC) 10 GM/15ML solution TAKE 30 MLS BY MOUTH 3 TIMES DAILY (Patient taking differently: Take 20 g by mouth 3 (three) times daily.)   Lancets Misc. (ACCU-CHEK SOFTCLIX LANCET DEV) KIT Use to check blood sugar daily for type 2 diabetes   levothyroxine (SYNTHROID) 75 MCG tablet TAKE 1 TABLET BY MOUTH ONCE DAILY   Melatonin 10 MG CAPS Take 10 mg at bedtime by mouth.   metFORMIN (GLUCOPHAGE-XR) 500 MG 24 hr tablet TAKE 1 TABLET BY MOUTH ONCE DAILY WITH BREAKFAST   metoprolol tartrate (LOPRESSOR) 25 MG tablet TAKE 1 TABLET BY MOUTH TWICE DAILY WITH FOOD   metroNIDAZOLE (METROCREAM) 0.75 % cream APPLY TOPICALLY AT BEDTIME (Patient taking differently: Apply 1 application  topically at bedtime.)   naloxone (NARCAN) nasal spray 4 mg/0.1 mL Place 1 spray into the nose as needed (opioid overdose).   nitroGLYCERIN (NITROSTAT) 0.4 MG SL tablet Place 0.4 mg under the tongue every 5 (five) minutes as needed for chest pain.   nystatin cream (MYCOSTATIN) Apply 1 application 3 (three) times daily as needed topically (for rash).    omeprazole (PRILOSEC) 40 MG capsule TAKE 1 CAPSULE BY MOUTH ONCE DAILY   ondansetron (ZOFRAN-ODT) 4 MG disintegrating tablet Take 4 mg by mouth every 8 (eight) hours as needed for vomiting or nausea.   OXYGEN Inhale into the lungs.   QUEtiapine (SEROQUEL) 300 MG tablet TAKE 1 TABLET BY MOUTH AT BEDTIME   rifaximin (XIFAXAN) 550 MG TABS tablet Take 550 mg by mouth 2 (two) times daily.    spironolactone (ALDACTONE) 100 MG tablet Take 100 mg by mouth daily.   tamsulosin (FLOMAX) 0.4 MG CAPS capsule Take 1 capsule (0.4 mg total) by mouth every evening.   triamcinolone cream (KENALOG) 0.1 % APPLY TOPICALLY TWICE DAILY AS NEEDED (Patient taking differently: Apply 1 application  topically 2 (two) times daily as needed (rash).)   Vitamin D, Ergocalciferol, (DRISDOL) 1.25 MG (50000 UNIT) CAPS capsule Take 50,000 Units by mouth every Monday.   prazosin (MINIPRESS) 1 MG capsule Take 1 capsule (1 mg total) by mouth at bedtime.   prazosin (MINIPRESS) 5 MG capsule Take 1 capsule (5 mg total) by mouth at bedtime.   venlafaxine XR (EFFEXOR-XR) 150 MG 24 hr capsule Take 1 capsule (150 mg total) by mouth daily. Take with 75 mg capsule to equal 225 mg once daily   venlafaxine XR (EFFEXOR-XR) 75 MG 24 hr capsule Take 1  capsule (75 mg total) by mouth daily. Take with 150 mg dose to equal 225 mg once daily   No facility-administered medications prior to visit.    Review of Systems  {Labs  Heme  Chem  Endocrine  Serology  Results Review (optional):23779}   Objective    BP (!) 145/74 (BP Location: Left Arm, Patient Position: Sitting, Cuff Size: Normal)   Pulse 91   Temp 98 F (36.7 C) (Oral)   Wt 233 lb (105.7 kg)   SpO2 99%   BMI 32.96 kg/m  Vitals:   09/09/22 1435 09/09/22 1436  BP: (!) 159/73 (!) 145/74  Pulse: 91   Temp: 98 F (36.7 C)   TempSrc: Oral   SpO2: 99%   Weight: 233 lb (105.7 kg)      Physical Exam  ***  No results found for any visits on 09/09/22.  Assessment & Plan     ***  No follow-ups on file.      {provider attestation***:1}   Lelon Huh, MD  [facility] [phone] (phone) [phone] (fax)  [place]

## 2022-09-10 LAB — LIPID PANEL WITH LDL/HDL RATIO
Cholesterol, Total: 124 mg/dL (ref 100–199)
HDL: 74 mg/dL (ref >39)
LDL Chol Calc (NIH): 32 mg/dL (ref 0–99)
LDL/HDL Ratio: 0.4 ratio (ref 0.0–3.6)
Triglycerides: 95 mg/dL (ref 0–149)
VLDL Cholesterol Cal: 18 mg/dL (ref 5–40)

## 2022-09-10 LAB — CBC WITH DIFFERENTIAL/PLATELET
Basophils Absolute: 0 10*3/uL (ref 0.0–0.2)
Basos: 1 %
EOS (ABSOLUTE): 0.1 10*3/uL (ref 0.0–0.4)
Eos: 3 %
Hematocrit: 35.8 % — ABNORMAL LOW (ref 37.5–51.0)
Hemoglobin: 12 g/dL — ABNORMAL LOW (ref 13.0–17.7)
Immature Grans (Abs): 0 10*3/uL (ref 0.0–0.1)
Immature Granulocytes: 0 %
Lymphocytes Absolute: 1.7 10*3/uL (ref 0.7–3.1)
Lymphs: 35 %
MCH: 29.3 pg (ref 26.6–33.0)
MCHC: 33.5 g/dL (ref 31.5–35.7)
MCV: 87 fL (ref 79–97)
Monocytes Absolute: 0.3 10*3/uL (ref 0.1–0.9)
Monocytes: 6 %
Neutrophils Absolute: 2.6 10*3/uL (ref 1.4–7.0)
Neutrophils: 55 %
Platelets: 159 10*3/uL (ref 150–450)
RBC: 4.1 x10E6/uL — ABNORMAL LOW (ref 4.14–5.80)
RDW: 13 % (ref 11.6–15.4)
WBC: 4.7 10*3/uL (ref 3.4–10.8)

## 2022-09-10 LAB — COMPREHENSIVE METABOLIC PANEL
ALT: 25 IU/L (ref 0–44)
AST: 24 IU/L (ref 0–40)
Albumin/Globulin Ratio: 1.8 (ref 1.2–2.2)
Albumin: 4.6 g/dL (ref 3.9–4.9)
Alkaline Phosphatase: 127 IU/L — ABNORMAL HIGH (ref 44–121)
BUN/Creatinine Ratio: 16 (ref 10–24)
BUN: 16 mg/dL (ref 8–27)
Bilirubin Total: 0.4 mg/dL (ref 0.0–1.2)
CO2: 21 mmol/L (ref 20–29)
Calcium: 8.6 mg/dL (ref 8.6–10.2)
Chloride: 102 mmol/L (ref 96–106)
Creatinine, Ser: 0.99 mg/dL (ref 0.76–1.27)
Globulin, Total: 2.5 g/dL (ref 1.5–4.5)
Glucose: 128 mg/dL — ABNORMAL HIGH (ref 70–99)
Potassium: 4.6 mmol/L (ref 3.5–5.2)
Sodium: 138 mmol/L (ref 134–144)
Total Protein: 7.1 g/dL (ref 6.0–8.5)
eGFR: 86 mL/min/{1.73_m2} (ref >59)

## 2022-09-10 LAB — HEMOGLOBIN A1C
Est. average glucose Bld gHb Est-mCnc: 151 mg/dL
Hgb A1c MFr Bld: 6.9 % — ABNORMAL HIGH (ref 4.8–5.6)

## 2022-09-10 LAB — TSH: TSH: 2.64 u[IU]/mL (ref 0.450–4.500)

## 2022-09-10 LAB — PSA TOTAL (REFLEX TO FREE): Prostate Specific Ag, Serum: 0.2 ng/mL (ref 0.0–4.0)

## 2022-09-10 LAB — T4, FREE: Free T4: 0.66 ng/dL — ABNORMAL LOW (ref 0.82–1.77)

## 2022-09-18 ENCOUNTER — Other Ambulatory Visit: Payer: Self-pay | Admitting: Family Medicine

## 2022-09-18 MED ORDER — LEVOTHYROXINE SODIUM 88 MCG PO TABS: 88.0000 ug | ORAL_TABLET | Freq: Every day | ORAL | 2 refills | Status: DC

## 2022-10-16 ENCOUNTER — Other Ambulatory Visit: Payer: Self-pay | Admitting: Family Medicine

## 2022-10-16 DIAGNOSIS — F3342 Major depressive disorder, recurrent, in full remission: Secondary | ICD-10-CM

## 2022-10-16 DIAGNOSIS — F5101 Primary insomnia: Secondary | ICD-10-CM

## 2022-11-26 ENCOUNTER — Telehealth: Payer: Self-pay | Admitting: Family Medicine

## 2022-11-26 NOTE — Telephone Encounter (Signed)
Left message for patient to call back and schedule Medicare Annual Wellness Visit (AWV) in office.   If not able to come in office, please offer to do virtually or by telephone.  Left office number and my jabber [phone].  AWVI eligible as of 12/02/2009   Please schedule at anytime with Nurse Health Advisor.

## 2022-11-27 ENCOUNTER — Other Ambulatory Visit: Payer: Self-pay | Admitting: Family Medicine

## 2023-03-26 ENCOUNTER — Telehealth: Payer: Self-pay | Admitting: Family Medicine

## 2023-03-26 NOTE — Telephone Encounter (Signed)
Contacted Ray Robles to schedule their annual wellness visit. Appointment made for 03/31/2023.  Thank you,  [facility] Direct dial  [phone]

## 2023-03-31 ENCOUNTER — Ambulatory Visit (INDEPENDENT_AMBULATORY_CARE_PROVIDER_SITE_OTHER): Payer: Medicare Other

## 2023-03-31 VITALS — Ht 71.0 in | Wt 224.0 lb

## 2023-03-31 DIAGNOSIS — Z0000 Encounter for general adult medical examination without abnormal findings: Secondary | ICD-10-CM | POA: Diagnosis not present

## 2023-03-31 NOTE — Progress Notes (Signed)
I connected with  [name] on 03/31/23 by a audio enabled telemedicine application and verified that I am speaking with the correct person using two identifiers.  Patient Location: Home  Provider Location: Home Office  I discussed the limitations of evaluation and management by telemedicine. The patient expressed understanding and agreed to proceed.  Subjective:   Ray Robles is a 62 y.o. male who presents for an Initial Medicare Annual Wellness Visit.  Review of Systems    Cardiac Risk Factors include: advanced age (>19men, >34 women);diabetes mellitus;dyslipidemia;hypertension;male gender;obesity (BMI >30kg/m2);sedentary lifestyle    Objective:    Today's Vitals   03/31/23 1115  Weight: 224 lb (101.6 kg)  Height: 5\' 11"  (1.803 m)  PainSc: 6    Body mass index is 31.24 kg/m.     03/31/2023   11:30 AM 05/14/2021    6:29 AM 05/10/2021    1:53 PM 04/20/2021   12:54 PM 01/29/2021    4:04 PM 10/27/2020    6:06 PM 12/21/2018    1:28 PM  Advanced Directives  Does Patient Have a Medical Advance Directive? No No No No No No   Would patient like information on creating a medical advance directive?  No - Patient declined No - Patient declined No - Patient declined No - Patient declined       Information is confidential and restricted. Go to Review Flowsheets to unlock data.    Current Medications (verified) Outpatient Encounter Medications as of 03/31/2023  Medication Sig   Accu-Chek Softclix Lancets lancets Use to check blood sugar daily for type 2 diabetes   albuterol (VENTOLIN HFA) 108 (90 Base) MCG/ACT inhaler Inhale 2 puffs into the lungs every 6 (six) hours as needed for wheezing or shortness of breath.   aspirin-acetaminophen-caffeine (EXCEDRIN MIGRAINE) 250-250-65 MG tablet Take 2-3 tablets by mouth every 6 (six) hours as needed for headache.   buprenorphine-naloxone (SUBOXONE) 2-0.5 mg SUBL SL tablet Place 1-2 tablets See admin instructions under the tongue. Take 2  tablets by mouth in the morning, take 1 tablet by mouth midday and take 2 tablets by mouth at bedtime   diphenoxylate-atropine (LOMOTIL) 2.5-0.025 MG tablet Take 2 tablets by mouth every 6 (six) hours as needed for diarrhea or loose stools.    ezetimibe (ZETIA) 10 MG tablet TAKE 1 TABLET BY MOUTH ONCE DAILY   furosemide (LASIX) 20 MG tablet Take 20 mg by mouth daily.   glucose blood (ACCU-CHEK GUIDE) test strip Use to check blood sugar daily for type 2 diabetes   lactulose (CHRONULAC) 10 GM/15ML solution TAKE 30 MLS BY MOUTH 3 TIMES DAILY (Patient taking differently: Take 20 g by mouth 3 (three) times daily.)   Lancets Misc. (ACCU-CHEK SOFTCLIX LANCET DEV) KIT Use to check blood sugar daily for type 2 diabetes   levothyroxine (SYNTHROID) 88 MCG tablet Take 1 tablet (88 mcg total) by mouth daily.   Melatonin 10 MG CAPS Take 10 mg at bedtime by mouth.   metFORMIN (GLUCOPHAGE-XR) 500 MG 24 hr tablet TAKE 1 TABLET BY MOUTH ONCE DAILY WITH BREAKFAST   metoprolol tartrate (LOPRESSOR) 25 MG tablet TAKE 1 TABLET BY MOUTH TWICE DAILY WITH FOOD   metroNIDAZOLE (METROCREAM) 0.75 % cream APPLY TOPICALLY AT BEDTIME (Patient taking differently: Apply 1 application  topically at bedtime.)   naloxone (NARCAN) nasal spray 4 mg/0.1 mL Place 1 spray into the nose as needed (opioid overdose).   nitroGLYCERIN (NITROSTAT) 0.4 MG SL tablet Place 0.4 mg under the tongue every 5 (five)  minutes as needed for chest pain.   nystatin cream (MYCOSTATIN) Apply 1 application 3 (three) times daily as needed topically (for rash).    omeprazole (PRILOSEC) 40 MG capsule TAKE 1 CAPSULE BY MOUTH ONCE DAILY   ondansetron (ZOFRAN-ODT) 4 MG disintegrating tablet Take 4 mg by mouth every 8 (eight) hours as needed for vomiting or nausea.   OXYGEN Inhale into the lungs.   QUEtiapine (SEROQUEL) 300 MG tablet TAKE 1 TABLET BY MOUTH AT BEDTIME   rifaximin (XIFAXAN) 550 MG TABS tablet Take 550 mg by mouth 2 (two) times daily.   spironolactone  (ALDACTONE) 100 MG tablet Take 100 mg by mouth daily.   tamsulosin (FLOMAX) 0.4 MG CAPS capsule TAKE 1 CAPSULE BY MOUTH ONCE EVERY EVENING   triamcinolone cream (KENALOG) 0.1 % APPLY TOPICALLY TWICE DAILY AS NEEDED (Patient taking differently: Apply 1 application  topically 2 (two) times daily as needed (rash).)   Vitamin D, Ergocalciferol, (DRISDOL) 1.25 MG (50000 UNIT) CAPS capsule Take 50,000 Units by mouth every Monday.   prazosin (MINIPRESS) 1 MG capsule Take 1 capsule (1 mg total) by mouth at bedtime.   prazosin (MINIPRESS) 5 MG capsule Take 1 capsule (5 mg total) by mouth at bedtime.   venlafaxine XR (EFFEXOR-XR) 150 MG 24 hr capsule Take 1 capsule (150 mg total) by mouth daily. Take with 75 mg capsule to equal 225 mg once daily   venlafaxine XR (EFFEXOR-XR) 75 MG 24 hr capsule Take 1 capsule (75 mg total) by mouth daily. Take with 150 mg dose to equal 225 mg once daily   No facility-administered encounter medications on file as of 03/31/2023.    Allergies (verified) Ambien [zolpidem tartrate], Ibuprofen, Morphine, Tylenol [acetaminophen], Xanax [alprazolam], and Zaleplon   History: Past Medical History:  Diagnosis Date   Anxiety    Arthritis    Chronic pain disorder    on buprenorphrine-naloxone   Cirrhosis (HCC)    a.) 10/04/2019 - Child Pugh A: 5 and MELD-Na score: 8   Depression    Dysrhythmia    Exertional dyspnea    GERD (gastroesophageal reflux disease)    food sticks in throat has esophagus stretched   H/O bacterial pneumonia 11/11/2007   2008 and 2015    Heart murmur    Hepatic encephalopathy (HCC)    takes daily lactulose and rifaximin   Hepatitis C    treatment naive genotype 3; Tx'd with 12 weeks Epclusa and Ribavirin   Hiatal hernia    History of substance abuse (HCC)    in remission; uses buprenorphrine-naloxone   Hx of endoscopy    05/12/2014- [facility]. Dr Bluford Kaufmann. Benign appearing stricture. Dilated the examination was otherwise normal.02/13/2012- [facility]ign  appearing intrinsic mild stenosis at GE junction, successfully dialated   Hypertension    Hypothyroidism    On supplemental oxygen by nasal cannula    2L PRN   Opioid dependence in early, early partial, sustained full, or sustained partial remission (HCC)    Peripheral neuropathy    Pneumonia    PTSD (post-traumatic stress disorder)    PUD (peptic ulcer disease)    Renal cyst, right    Sleep apnea    compliance issues with prescribed nocturnal BiPAP therapy   Splenomegaly    a.)  14.6 cm on 09/06/2019 MRI   T2DM (type 2 diabetes mellitus) (HCC)    Past Surgical History:  Procedure Laterality Date   COLONOSCOPY WITH PROPOFOL N/A 05/12/2015   Procedure: COLONOSCOPY WITH PROPOFOL;  Surgeon: Wallace Cullens, MD;  Location: [facility] ENDOSCOPY;  Service: Gastroenterology;  Laterality: N/A;   edg     HERNIA REPAIR  08/2012   Dr. Evette Cristal; [facility]   INGUINAL HERNIA REPAIR Right 10/28/2017   Procedure: HERNIA REPAIR INGUINAL ADULT;  Surgeon: Nadeen Landau, MD;  Location: [facility] ORS;  Service: General;  Laterality: Right;   XI ROBOTIC ASSISTED INGUINAL HERNIA REPAIR WITH MESH Bilateral 05/14/2021   Procedure: XI ROBOTIC ASSISTED INGUINAL HERNIA REPAIR WITH MESH;  Surgeon: Duanne Guess, MD;  Location: [facility] ORS;  Service: General;  Laterality: Bilateral;   Family History  Problem Relation Age of Onset   Cancer Mother        Throat, lung, skin   COPD Mother    Arthritis Mother    Diabetes Father    Arthritis Father    Cancer Father        Bladder   Alcohol abuse Brother    Diabetes Brother    Alcohol abuse Brother    Drug abuse Brother    Cancer Paternal Grandfather    Cancer Paternal Uncle    Social History   Socioeconomic History   Marital status: Married    Spouse name: Deatra Ina   Number of children: 4   Years of education: Not on file   Highest education level: Associate degree: occupational, Scientist, product/process development, or vocational program  Occupational History    Comment: retired  Tobacco Use    Smoking status: Never   Smokeless tobacco: Never  Vaping Use   Vaping Use: Never used  Substance and Sexual Activity   Alcohol use: No    Alcohol/week: 0.0 standard drinks of alcohol   Drug use: Not Currently    Types: Oxycodone   Sexual activity: Not Currently  Other Topics Concern   Not on file  Social History Narrative   Not on file   Social Determinants of Health   Financial Resource Strain: Medium Risk (03/31/2023)   Overall Financial Resource Strain (CARDIA)    Difficulty of Paying Living Expenses: Somewhat hard  Food Insecurity: No Food Insecurity (03/31/2023)   Hunger Vital Sign    Worried About Running Out of Food in the Last Year: Never true    Ran Out of Food in the Last Year: Never true  Transportation Needs: No Transportation Needs (03/31/2023)   PRAPARE - Administrator, Civil Service (Medical): No    Lack of Transportation (Non-Medical): No  Physical Activity: Insufficiently Active (03/31/2023)   Exercise Vital Sign    Days of Exercise per Week: 5 days    Minutes of Exercise per Session: 20 min  Stress: Stress Concern Present (03/31/2023)   [employer] of Occupational Health - Occupational Stress Questionnaire    Feeling of Stress : To some extent  Social Connections: Socially Integrated (03/31/2023)   Social Connection and Isolation Panel [NHANES]    Frequency of Communication with Friends and Family: More than three times a week    Frequency of Social Gatherings with Friends and Family: More than three times a week    Attends Religious Services: More than 4 times per year    Active Member of [employer] or Organizations: Yes    Attends Engineer, structural: More than 4 times per year    Marital Status: Married    Tobacco Counseling Counseling given: Not Answered   Clinical Intake:  Pre-visit preparation completed: Yes  Pain : 0-10 Pain Score: 6  Pain Type: Chronic pain Pain Location: Back (knees, hands, neck, shoulders) Pain  Orientation:  Lower Pain Descriptors / Indicators: Aching Pain Frequency: Constant Pain Relieving Factors: Tylenol  Pain Relieving Factors: Tylenol  BMI - recorded: 31.24 Nutritional Status: BMI > 30  Obese Nutritional Risks: None (cut on rt hand healing slow: no signs of infection right now) Diabetes: Yes CBG done?: No Did pt. bring in CBG monitor from home?: No  How often do you need to have someone help you when you read instructions, pamphlets, or other written materials from your doctor or pharmacy?: 1 - Never  Diabetic?yes  Interpreter Needed?: No  Comments: lives with wife Information entered by :: B.Chino Sardo,LPN   Activities of Daily Living    03/31/2023   11:32 AM 09/09/2022    2:40 PM  In your present state of health, do you have any difficulty performing the following activities:  Hearing? 0 0  Vision? 1 1  Difficulty concentrating or making decisions? 0 1  Walking or climbing stairs? 1 1  Dressing or bathing? 0 0  Doing errands, shopping? 0 0  Preparing Food and eating ? N   Using the Toilet? N   In the past six months, have you accidently leaked urine? N   Do you have problems with loss of bowel control? N   Managing your Medications? N   Managing your Finances? N   Housekeeping or managing your Housekeeping? N     Patient Care Team: Malva Limes, MD as PCP - General (Family Medicine) Julio Sicks, MD as Consulting Physician (Neurosurgery) Scot Jun, MD (Inactive) (Gastroenterology) Kandyce Rud., MD (Rheumatology) Oh, Ezzard Standing, MD (Inactive) (Gastroenterology) Woodfin Ganja, MD as Referring Physician (Gastroenterology) Kieth Brightly, MD as Consulting Physician (General Surgery) Malva Limes, MD as Referring Physician (Family Medicine)  Indicate any recent Medical Services you may have received from other than [facility] providers in the past year (date may be approximate).     Assessment:   This is a routine wellness  examination for [place].  Hearing/Vision screen Hearing Screening - Comments:: Adequate hearing Vision Screening - Comments:: Vision blurry in both eyes;has glasses Eclectic [id number] rescheduling  Dietary issues and exercise activities discussed: Current Exercise Habits: Home exercise routine, Type of exercise: walking, Time (Minutes): 20, Frequency (Times/Week): 3, Weekly Exercise (Minutes/Week): 60, Intensity: Mild, Exercise limited by: orthopedic condition(s)   Goals Addressed   None    Depression Screen    03/31/2023   11:26 AM 09/09/2022    2:39 PM 02/27/2022   11:08 AM 06/06/2021    2:13 PM 04/27/2021   10:48 AM 01/29/2021    4:04 PM 11/13/2020    3:05 PM  PHQ 2/9 Scores  PHQ - 2 Score 1 1 2  3  0 2  PHQ- 9 Score  9 9  12  9      Information is confidential and restricted. Go to Review Flowsheets to unlock data.    Fall Risk    03/31/2023   11:21 AM 09/09/2022    2:39 PM 02/27/2022   11:08 AM 01/29/2021    4:03 PM  Fall Risk   Falls in the past year? 1 0 0 1  Comment dog pulled pt down:no injuries     Number falls in past yr: 0  0 1  Injury with Fall? 0  0 0  Risk for fall due to : No Fall Risks  No Fall Risks History of fall(s);Mental status change  Follow up Education provided;Falls prevention discussed  Falls evaluation completed     FALL RISK PREVENTION  PERTAINING TO THE HOME:  Any stairs in or around the home? Yes  If so, are there any without handrails? Yes  Home free of loose throw rugs in walkways, pet beds, electrical cords, etc? Yes  Adequate lighting in your home to reduce risk of falls? Yes   ASSISTIVE DEVICES UTILIZED TO PREVENT FALLS:  Life alert? No  Use of a cane, walker or w/c? No  Grab bars in the bathroom? No  Shower chair or bench in shower? Yes  Elevated toilet seat or a handicapped toilet? No   Cognitive Function:        03/31/2023   11:35 AM  6CIT Screen  What Year? 0 points  What month? 0 points  What time? 0 points  Count back  from 20 0 points  Months in reverse 0 points  Repeat phrase 0 points  Total Score 0 points    Immunizations Immunization History  Administered Date(s) Administered   Influenza,inj,Quad PF,6+ Mos 08/30/2015, 08/19/2016, 09/02/2017, 08/06/2018, 09/04/2022   Influenza-Unspecified 10/31/2019, 10/16/2020, 10/22/2021   Pneumococcal Polysaccharide-23 10/30/2009   Tdap 10/29/2011   Zoster Recombinat (Shingrix) 01/24/2020, 06/12/2020    TDAP status: Up to date  Flu Vaccine status: Up to date  Covid-19 vaccine status: Declined, Education has been provided regarding the importance of this vaccine but patient still declined. Advised may receive this vaccine at local pharmacy or Health Dept.or vaccine clinic. Aware to provide a copy of the vaccination record if obtained from local pharmacy or Health Dept. Verbalized acceptance and understanding.  Qualifies for Shingles Vaccine? Yes   Zostavax completed Yes   Shingrix Completed?: Yes  Screening Tests Health Maintenance  Topic Date Due   COVID-19 Vaccine (1) Never done   FOOT EXAM  Never done   Diabetic kidney evaluation - Urine ACR  Never done   OPHTHALMOLOGY EXAM  04/15/2019   DTaP/Tdap/Td (2 - Td or Tdap) 10/28/2021   HEMOGLOBIN A1C  03/11/2023   INFLUENZA VACCINE  07/03/2023   Diabetic kidney evaluation - eGFR measurement  09/10/2023   Medicare Annual Wellness (AWV)  03/30/2024   COLONOSCOPY (Pts 45-11yrs Insurance coverage will need to be confirmed)  05/11/2025   Hepatitis C Screening  Completed   HIV Screening  Completed   Zoster Vaccines- Shingrix  Completed   HPV VACCINES  Aged Out    Health Maintenance  Health Maintenance Due  Topic Date Due   COVID-19 Vaccine (1) Never done   FOOT EXAM  Never done   Diabetic kidney evaluation - Urine ACR  Never done   OPHTHALMOLOGY EXAM  04/15/2019   DTaP/Tdap/Td (2 - Td or Tdap) 10/28/2021   HEMOGLOBIN A1C  03/11/2023    Colorectal cancer screening: Type of screening: Colonoscopy.  Completed yes. Repeat every 5-10 years Due 2026  Lung Cancer Screening: (Low Dose CT Chest recommended if Age 28-80 years, 30 pack-year currently smoking OR have quit w/in 15years.) does not qualify.   Lung Cancer Screening Referral: no  Additional Screening:  Hepatitis C Screening: does not qualify; Completed yes  Vision Screening: Recommended annual ophthalmology exams for early detection of glaucoma and other disorders of the eye. Is the patient up to date with their annual eye exam?  Yes  Who is the provider or what is the name of the office in which the patient attends annual eye exams? [facility] If pt is not established with a provider, would they like to be referred to a provider to establish care? No .   Dental Screening:  Recommended annual dental exams for proper oral hygiene  Community Resource Referral / Chronic Care Management: CRR required this visit?  No   CCM required this visit?  No     Plan:     I have personally reviewed and noted the following in the patient's chart:   Medical and social history Use of alcohol, tobacco or illicit drugs  Current medications and supplements including opioid prescriptions. Patient is currently taking opioid prescriptions. Information provided to patient regarding non-opioid alternatives. Patient advised to discuss non-opioid treatment plan with their provider. Functional ability and status Nutritional status Physical activity Advanced directives List of other physicians Hospitalizations, surgeries, and ER visits in previous 12 months Vitals Screenings to include cognitive, depression, and falls Referrals and appointments  In addition, I have reviewed and discussed with patient certain preventive protocols, quality metrics, and best practice recommendations. A written personalized care plan for preventive services as well as general preventive health recommendations were provided to patient.     Sue Lush,  LPN   03/10/8118   Nurse Notes: pt says feeling better since off opioids. He is excited about his grand baby girl coming in June. He has no concerns or questions at this time.

## 2023-03-31 NOTE — Patient Instructions (Signed)
Ray Robles , Thank you for taking time to come for your Medicare Wellness Visit. I appreciate your ongoing commitment to your health goals. Please review the following plan we discussed and let me know if I can assist you in the future.   These are the goals we discussed:  Goals   None     This is a list of the screening recommended for you and due dates:  Health Maintenance  Topic Date Due   COVID-19 Vaccine (1) Never done   Complete foot exam   Never done   Yearly kidney health urinalysis for diabetes  Never done   Eye exam for diabetics  04/15/2019   DTaP/Tdap/Td vaccine (2 - Td or Tdap) 10/28/2021   Hemoglobin A1C  03/11/2023   Flu Shot  07/03/2023   Yearly kidney function blood test for diabetes  09/10/2023   Medicare Annual Wellness Visit  03/30/2024   Colon Cancer Screening  05/11/2025   Hepatitis C Screening: USPSTF Recommendation to screen - Ages 18-79 yo.  Completed   HIV Screening  Completed   Zoster (Shingles) Vaccine  Completed   HPV Vaccine  Aged Out    Advanced directives: no  Conditions/risks identified: low falls risk  Next appointment: Follow up in one year for your annual wellness visit 03/31/2024 @11 :45am telephone  Preventive Care 40-64 Years, Male Preventive care refers to lifestyle choices and visits with your health care provider that can promote health and wellness. What does preventive care include? A yearly physical exam. This is also called an annual well check. Dental exams once or twice a year. Routine eye exams. Ask your health care provider how often you should have your eyes checked. Personal lifestyle choices, including: Daily care of your teeth and gums. Regular physical activity. Eating a healthy diet. Avoiding tobacco and drug use. Limiting alcohol use. Practicing safe sex. Taking low-dose aspirin every day starting at age 15. What happens during an annual well check? The services and screenings done by your health care provider  during your annual well check will depend on your age, overall health, lifestyle risk factors, and family history of disease. Counseling  Your health care provider may ask you questions about your: Alcohol use. Tobacco use. Drug use. Emotional well-being. Home and relationship well-being. Sexual activity. Eating habits. Work and work Astronomer. Screening  You may have the following tests or measurements: Height, weight, and BMI. Blood pressure. Lipid and cholesterol levels. These may be checked every 5 years, or more frequently if you are over 58 years old. Skin check. Lung cancer screening. You may have this screening every year starting at age 65 if you have a 30-pack-year history of smoking and currently smoke or have quit within the past 15 years. Fecal occult blood test (FOBT) of the stool. You may have this test every year starting at age 63. Flexible sigmoidoscopy or colonoscopy. You may have a sigmoidoscopy every 5 years or a colonoscopy every 10 years starting at age 51. Prostate cancer screening. Recommendations will vary depending on your family history and other risks. Hepatitis C blood test. Hepatitis B blood test. Sexually transmitted disease (STD) testing. Diabetes screening. This is done by checking your blood sugar (glucose) after you have not eaten for a while (fasting). You may have this done every 1-3 years. Discuss your test results, treatment options, and if necessary, the need for more tests with your health care provider. Vaccines  Your health care provider may recommend certain vaccines, such as: Influenza vaccine.  This is recommended every year. Tetanus, diphtheria, and acellular pertussis (Tdap, Td) vaccine. You may need a Td booster every 10 years. Zoster vaccine. You may need this after age 93. Pneumococcal 13-valent conjugate (PCV13) vaccine. You may need this if you have certain conditions and have not been vaccinated. Pneumococcal polysaccharide  (PPSV23) vaccine. You may need one or two doses if you smoke cigarettes or if you have certain conditions. Talk to your health care provider about which screenings and vaccines you need and how often you need them. This information is not intended to replace advice given to you by your health care provider. Make sure you discuss any questions you have with your health care provider. Document Released: 12/15/2015 Document Revised: 08/07/2016 Document Reviewed: 09/19/2015 Elsevier Interactive Patient Education  2017 [employer].  Fall Prevention in the Home Falls can cause injuries. They can happen to people of all ages. There are many things you can do to make your home safe and to help prevent falls. What can I do on the outside of my home? Regularly fix the edges of walkways and driveways and fix any cracks. Remove anything that might make you trip as you walk through a door, such as a raised step or threshold. Trim any bushes or trees on the path to your home. Use bright outdoor lighting. Clear any walking paths of anything that might make someone trip, such as rocks or tools. Regularly check to see if handrails are loose or broken. Make sure that both sides of any steps have handrails. Any raised decks and porches should have guardrails on the edges. Have any leaves, snow, or ice cleared regularly. Use sand or salt on walking paths during winter. Clean up any spills in your garage right away. This includes oil or grease spills. What can I do in the bathroom? Use night lights. Install grab bars by the toilet and in the tub and shower. Do not use towel bars as grab bars. Use non-skid mats or decals in the tub or shower. If you need to sit down in the shower, use a plastic, non-slip stool. Keep the floor dry. Clean up any water that spills on the floor as soon as it happens. Remove soap buildup in the tub or shower regularly. Attach bath mats securely with double-sided non-slip rug  tape. Do not have throw rugs and other things on the floor that can make you trip. What can I do in the bedroom? Use night lights. Make sure that you have a light by your bed that is easy to reach. Do not use any sheets or blankets that are too big for your bed. They should not hang down onto the floor. Have a firm chair that has side arms. You can use this for support while you get dressed. Do not have throw rugs and other things on the floor that can make you trip. What can I do in the kitchen? Clean up any spills right away. Avoid walking on wet floors. Keep items that you use a lot in easy-to-reach places. If you need to reach something above you, use a strong step stool that has a grab bar. Keep electrical cords out of the way. Do not use floor polish or wax that makes floors slippery. If you must use wax, use non-skid floor wax. Do not have throw rugs and other things on the floor that can make you trip. What can I do with my stairs? Do not leave any items on the stairs.  Make sure that there are handrails on both sides of the stairs and use them. Fix handrails that are broken or loose. Make sure that handrails are as long as the stairways. Check any carpeting to make sure that it is firmly attached to the stairs. Fix any carpet that is loose or worn. Avoid having throw rugs at the top or bottom of the stairs. If you do have throw rugs, attach them to the floor with carpet tape. Make sure that you have a light switch at the top of the stairs and the bottom of the stairs. If you do not have them, ask someone to add them for you. What else can I do to help prevent falls? Wear shoes that: Do not have high heels. Have rubber bottoms. Are comfortable and fit you well. Are closed at the toe. Do not wear sandals. If you use a stepladder: Make sure that it is fully opened. Do not climb a closed stepladder. Make sure that both sides of the stepladder are locked into place. Ask someone to  hold it for you, if possible. Clearly mark and make sure that you can see: Any grab bars or handrails. First and last steps. Where the edge of each step is. Use tools that help you move around (mobility aids) if they are needed. These include: Canes. Walkers. Scooters. Crutches. Turn on the lights when you go into a dark area. Replace any light bulbs as soon as they burn out. Set up your furniture so you have a clear path. Avoid moving your furniture around. If any of your floors are uneven, fix them. If there are any pets around you, be aware of where they are. Review your medicines with your doctor. Some medicines can make you feel dizzy. This can increase your chance of falling. Ask your doctor what other things that you can do to help prevent falls. This information is not intended to replace advice given to you by your health care provider. Make sure you discuss any questions you have with your health care provider. Document Released: 09/14/2009 Document Revised: 04/25/2016 Document Reviewed: 12/23/2014 Elsevier Interactive Patient Education  2017 [employer].

## 2023-04-15 ENCOUNTER — Other Ambulatory Visit: Payer: Self-pay | Admitting: Family Medicine

## 2023-04-15 NOTE — Telephone Encounter (Signed)
Unable to refill per protocol, Rx request is too soon. Last refill 11/27/22 for 90 and 1 refill. Next refill due in June.  Requested Prescriptions  Pending Prescriptions Disp Refills   tamsulosin (FLOMAX) 0.4 MG CAPS capsule [Pharmacy Med Name: TAMSULOSIN HCL 0.4 MG CAP] 90 capsule 1    Sig: TAKE 1 CAPSULE BY MOUTH ONCE EVERY EVENING     Urology: Alpha-Adrenergic Blocker Failed - 04/15/2023 11:44 AM      Failed - Last BP in normal range    BP Readings from Last 1 Encounters:  09/09/22 (!) 145/74         Passed - PSA in normal range and within 360 days    Prostate Specific Ag, Serum  Date Value Ref Range Status  09/09/2022 0.2 0.0 - 4.0 ng/mL Final    Comment:    Roche ECLIA methodology. According to the American Urological Association, Serum PSA should decrease and remain at undetectable levels after radical prostatectomy. The AUA defines biochemical recurrence as an initial PSA value 0.2 ng/mL or greater followed by a subsequent confirmatory PSA value 0.2 ng/mL or greater. Values obtained with different assay methods or kits cannot be used interchangeably. Results cannot be interpreted as absolute evidence of the presence or absence of malignant disease.          Passed - Valid encounter within last 12 months    Recent Outpatient Visits           7 months ago Primary hypertension   [facility] [facility] Malva [facility], [facility]   1 year ago Pain in both lower extremities   [place] [facility] Malva Limes, MD   1 year ago Unilateral recurrent inguinal hernia without obstruction or gangrene   [facility] Malva Limes, MD   2 years ago Controlled type 2 diabetes mellitus without complication, without long-term current use of insulin (HCC)   [facility] Malva Limes, MD   2 years ago Bronchitis   [facility] Malva Limes, MD

## 2023-04-15 NOTE — Telephone Encounter (Signed)
Refilled until upcoming appt in October  Requested Prescriptions  Pending Prescriptions Disp Refills   levothyroxine (SYNTHROID) 88 MCG tablet [Pharmacy Med Name: LEVOTHYROXINE SODIUM 88 MCG TAB] 90 tablet 0    Sig: TAKE 1 TABLET BY MOUTH ONCE DAILY ON AN EMPTY STOMACH. WAIT 30 MINUTES BEFORE TAKING OTHER MEDS.     Endocrinology:  Hypothyroid Agents Passed - 04/15/2023 11:42 AM      Passed - TSH in normal range and within 360 days    TSH  Date Value Ref Range Status  09/09/2022 2.640 0.450 - 4.500 uIU/mL Final         Passed - Valid encounter within last 12 months    Recent Outpatient Visits           7 months ago Primary hypertension   [place] [facility] Malva Limes, MD   1 year ago Pain in both lower extremities   [place] [facility] Malva Limes, MD   1 year ago Unilateral recurrent inguinal hernia without obstruction or gangrene   [facility] Malva Limes, MD   2 years ago Controlled type 2 diabetes mellitus without complication, without long-term current use of insulin (HCC)    [facility] Malva Limes, [facility]   2 years ago Bronchitis   [facility] Malva Limes, MD

## 2023-06-20 ENCOUNTER — Other Ambulatory Visit: Payer: Self-pay | Admitting: Family Medicine

## 2023-06-20 DIAGNOSIS — E119 Type 2 diabetes mellitus without complications: Secondary | ICD-10-CM

## 2023-06-20 DIAGNOSIS — I10 Essential (primary) hypertension: Secondary | ICD-10-CM

## 2023-06-20 NOTE — Telephone Encounter (Signed)
Requested Prescriptions  Pending Prescriptions Disp Refills   metoprolol tartrate (LOPRESSOR) 25 MG tablet [Pharmacy Med Name: METOPROLOL TARTRATE 25 MG TAB] 60 tablet 0    Sig: TAKE 1 TABLET BY MOUTH TWICE DAILY WITH FOOD     Cardiovascular:  Beta Blockers Failed - 06/20/2023 10:51 AM      Failed - Last BP in normal range    BP Readings from Last 1 Encounters:  09/09/22 (!) 145/74         Failed - Valid encounter within last 6 months    Recent Outpatient Visits           9 months ago Primary hypertension   [place] [facility] Malva Limes, MD   1 year ago Pain in both lower extremities   [place] [facility] Malva Limes, MD   2 years ago Unilateral recurrent inguinal hernia without obstruction or gangrene   [place] [facility] Malva Limes, MD   2 years ago Controlled type 2 diabetes mellitus without complication, without long-term current use of insulin (HCC)   [place] [facility] Malva Limes, MD   2 years ago Bronchitis   [place] [facility] Malva Limes, MD              Passed - Last Heart Rate in normal range    Pulse Readings from Last 1 Encounters:  09/09/22 91          metFORMIN (GLUCOPHAGE-XR) 500 MG 24 hr tablet [Pharmacy Med Name: METFORMIN HCL ER 500 MG TAB] 30 tablet 0    Sig: TAKE 1 TABLET BY MOUTH ONCE DAILY WITH BREAKFAST     Endocrinology:  Diabetes - Biguanides Failed - 06/20/2023 10:51 AM      Failed - HBA1C is between 0 and 7.9 and within 180 days    Hemoglobin A1C  Date Value Ref Range Status  03/19/2015 5.9 % Final    Comment:    4.0-6.0 NOTE: New Reference Range  02/07/15    Hgb A1c MFr Bld  Date Value Ref Range Status  09/09/2022 6.9 (H) 4.8 - 5.6 % Final    Comment:             Prediabetes: 5.7 - 6.4          Diabetes: >6.4          Glycemic control for adults with diabetes: <7.0          Failed - B12 Level in normal  range and within 720 days    Vitamin B-12  Date Value Ref Range Status  10/15/2018 825 232 - 1,245 pg/mL Final         Failed - Valid encounter within last 6 months    Recent Outpatient Visits           9 months ago Primary hypertension   [place] [facility] Malva Limes, MD   1 year ago Pain in both lower extremities   [facility] Malva Limes, MD   2 years ago Unilateral recurrent inguinal hernia without obstruction or gangrene   [place] [facility] Malva Limes, MD   2 years ago Controlled type 2 diabetes mellitus without complication, without long-term current use of insulin (HCC)   [place] [facility] Malva Limes, MD   2 years ago Bronchitis   [facility] Malva Limes,  MD              Passed - Cr in normal range and within 360 days    Creatinine  Date Value Ref Range Status  03/29/2015 1.06 mg/dL Final    Comment:    4.09-8.11 NOTE: New Reference Range  02/07/15    Creatinine, Ser  Date Value Ref Range Status  09/09/2022 0.99 0.76 - 1.27 mg/dL Final         Passed - eGFR in normal range and within 360 days    EGFR (African American)  Date Value Ref Range Status  03/29/2015 >60  Final   GFR calc Af Amer  Date Value Ref Range Status  11/13/2020 102 >59 mL/min/1.73 Final    Comment:    **In accordance with recommendations from the NKF-ASN Task force,**   [facility] is in the process of updating its eGFR calculation to the   2021 CKD-EPI creatinine equation that estimates kidney function   without a race variable.    EGFR (Non-African Amer.)  Date Value Ref Range Status  03/29/2015 >60  Final    Comment:    eGFR values <43mL/min/1.73 m2 may be an indication of chronic kidney disease (CKD). Calculated eGFR is useful in patients with stable renal function. The eGFR calculation will not be reliable in acutely ill patients when  serum creatinine is changing rapidly. It is not useful in patients on dialysis. The eGFR calculation may not be applicable to patients at the low and high extremes of body sizes, pregnant women, and vegetarians.    GFR, Estimated  Date Value Ref Range Status  05/11/2021 >60 >60 mL/min Final    Comment:    (NOTE) Calculated using the CKD-EPI Creatinine Equation (2021)    eGFR  Date Value Ref Range Status  09/09/2022 86 >59 mL/min/1.73 Final         Passed - CBC within normal limits and completed in the last 12 months    WBC  Date Value Ref Range Status  09/09/2022 4.7 3.4 - 10.8 x10E3/uL Final  05/11/2021 4.2 4.0 - 10.5 K/uL Final   RBC  Date Value Ref Range Status  09/09/2022 4.10 (L) 4.14 - 5.80 x10E6/uL Final  05/11/2021 4.23 4.22 - 5.81 MIL/uL Final   Hemoglobin  Date Value Ref Range Status  09/09/2022 12.0 (L) 13.0 - 17.7 g/dL Final   Hematocrit  Date Value Ref Range Status  09/09/2022 35.8 (L) 37.5 - 51.0 % Final   MCHC  Date Value Ref Range Status  09/09/2022 33.5 31.5 - 35.7 g/dL Final  91/47/8295 62.1 30.0 - 36.0 g/dL Final   [facility]  Date Value Ref Range Status  09/09/2022 29.3 26.6 - 33.0 pg Final  05/11/2021 29.3 26.0 - 34.0 pg Final   MCV  Date Value Ref Range Status  09/09/2022 87 79 - 97 fL Final  03/29/2015 91 80 - 100 fL Final   No results found for: "PLTCOUNTKUC", "LABPLAT", "POCPLA" RDW  Date Value Ref Range Status  09/09/2022 13.0 11.6 - 15.4 % Final  03/29/2015 13.7 11.5 - 14.5 % Final

## 2023-07-02 ENCOUNTER — Other Ambulatory Visit: Payer: Self-pay | Admitting: Family Medicine

## 2023-07-02 DIAGNOSIS — I10 Essential (primary) hypertension: Secondary | ICD-10-CM

## 2023-07-02 DIAGNOSIS — E119 Type 2 diabetes mellitus without complications: Secondary | ICD-10-CM

## 2023-07-03 NOTE — Telephone Encounter (Signed)
Requested medication (s) are due for refill today: Yes  Requested medication (s) are on the active medication list: Yes  Last refill:  Courtesy refill already given.  Future visit scheduled: No  Notes to clinic:  Left message to call and make appointment.    Requested Prescriptions  Pending Prescriptions Disp Refills   levothyroxine (SYNTHROID) 88 MCG tablet [Pharmacy Med Name: LEVOTHYROXINE SODIUM 88 MCG TAB] 90 tablet 0    Sig: TAKE 1 TABLET BY MOUTH ONCE DAILY ON AN EMPTY STOMACH. WAIT 30 MINUTES BEFORE TAKING OTHER MEDS.     Endocrinology:  Hypothyroid Agents Passed - 07/02/2023  2:04 PM      Passed - TSH in normal range and within 360 days    TSH  Date Value Ref Range Status  09/09/2022 2.640 0.450 - 4.500 uIU/mL Final         Passed - Valid encounter within last 12 months    Recent Outpatient Visits           9 months ago Primary hypertension   [facility] Malva Limes, MD   1 year ago Pain in both lower extremities   [place] Malva Limes, [facility]   2 years ago Unilateral recurrent inguinal hernia without obstruction or gangrene   [facility] Malva Limes, MD   2 years ago Controlled type 2 diabetes mellitus without complication, without long-term current use of insulin (HCC)   [facility] Mal[facility]   2 years ago Bronchitis   [facility]               metoprolol tartrate (LOPRESSOR) 25 MG tablet [Pharmacy Med Name: METOPROLOL TARTRATE 25 MG TAB] 60 tablet 0    Sig: TAKE 1 TABLET BY MOUTH TWICE DAILY WITH FOOD NEED APPOINTMENT FOR FURTHER FILLS*     Cardiovascular:  Beta Blockers Failed - 07/02/2023  2:04 PM      Failed - Last BP in normal range    BP Readings from Last 1 Encounters:  09/09/22 (!) 145/74         Failed - Valid encounter within last 6 months    Recent Outpatient  Visits           9 months ago Primary hypertension   [facility] Malva [facility], [facility]   1 year ago Pain in both lower extremities   [facility] Mal[facility], [facility]   2 years ago Unilateral recurrent inguinal hernia without obstruction or gangrene   [facility]   2 years ago Controlled type 2 diabetes mellitus without complication, without long-term current use of insulin (HCC)   [place] Mal[facility] Limes, [facility]   2 years ago Bronchitis   [facility] Mal[facility], [facility]              Passed - Last Heart Rate in normal range    Pulse Readings from Last 1 Encounters:  09/09/22 91          metFORMIN (GLUCOPHAGE-XR) 500 MG 24 hr tablet [Pharmacy Med Name: METFORMIN HCL ER 500 MG TAB] 30 tablet 0    Sig: TAKE 1 TABLET BY MOUTH ONCE DAILY WITH BREAKFAST *NEED APPOINTMENT     Endocrinology:  Diabetes - Biguanides Failed - 07/02/2023  2:04 PM      Failed - HBA1C is between 0 and 7.9 and within 180 days    Hemoglobin A1C  Date Value Ref Range Status  03/19/2015 5.9 % Final    Comment:    4.0-6.0 NOTE: New Reference Range  02/07/15    Hgb A1c MFr Bld  Date Value Ref Range Status  09/09/2022 6.9 (H) 4.8 - 5.6 % Final    Comment:             Prediabetes: 5.7 - 6.4          Diabetes: >6.4          Glycemic control for adults with diabetes: <7.0          Failed - B12 Level in normal range and within 720 days    Vitamin B-12  Date Value Ref Range Status  10/15/2018 825 232 - 1,245 pg/mL Final         Failed - Valid encounter within last 6 months    Recent Outpatient Visits           9 months ago Primary hypertension   [facility] Malva Limes, MD   1 year ago Pain in both lower extremities   [facility] Malva Limes, MD   2 years ago Unilateral recurrent  inguinal hernia without obstruction or gangrene   [facility], [facility]   2 years ago Controlled type 2 diabetes mellitus without complication, without long-term current use of insulin (HCC)   [facility] Malva Limes, MD   2 years ago Bronchitis   [facility] Mal[facility] Limes, MD              Passed - Cr in normal range and within 360 days    Creatinine  Date Value Ref Range Status  03/29/2015 1.06 mg/dL Final    Comment:    1.32-4.40 NOTE: New Reference Range  02/07/15    Creatinine, Ser  Date Value Ref Range Status  09/09/2022 0.99 0.76 - 1.27 mg/dL Final         Passed - eGFR in normal range and within 360 days    EGFR (African American)  Date Value Ref Range Status  03/29/2015 >60  Final   GFR calc Af Amer  Date Value Ref Range Status  11/13/2020 102 >59 mL/min/1.73 Final    Comment:    **In accordance with recommendations from the NKF-ASN Task force,**   [facility] is in the process of updating its eGFR calculation to the   2021 CKD-EPI creatinine equation that estimates kidney function   without a race variable.    EGFR (Non-African Amer.)  Date Value Ref Range Status  03/29/2015 >60  Final    Comment:    eGFR values <30mL/min/1.73 m2 may be an indication of chronic kidney disease (CKD). Calculated eGFR is useful in patients with stable renal function. The eGFR calculation will not be reliable in acutely ill patients when serum creatinine is changing rapidly. It is not useful in patients on dialysis. The eGFR calculation may not be applicable to patients at the low and high extremes of body sizes, pregnant women, and vegetarians.    GFR, Estimated  Date Value Ref Range Status  05/11/2021 >60 >60 mL/min Final    Comment:    (NOTE) Calculated using the CKD-EPI Creatinine Equation (2021)    eGFR  Date  Value Ref Range Status  09/09/2022 86 >59 mL/min/1.73  Final         Passed - CBC within normal limits and completed in the last 12 months    WBC  Date Value Ref Range Status  09/09/2022 4.7 3.4 - 10.8 x10E3/uL Final  05/11/2021 4.2 4.0 - 10.5 K/uL Final   RBC  Date Value Ref Range Status  09/09/2022 4.10 (L) 4.14 - 5.80 x10E6/uL Final  05/11/2021 4.23 4.22 - 5.81 MIL/uL Final   Hemoglobin  Date Value Ref Range Status  09/09/2022 12.0 (L) 13.0 - 17.7 g/dL Final   Hematocrit  Date Value Ref Range Status  09/09/2022 35.8 (L) 37.5 - 51.0 % Final   MCHC  Date Value Ref Range Status  09/09/2022 33.5 31.5 - 35.7 g/dL Final  84/13/2440 10.2 30.0 - 36.0 g/dL Final   [facility]  Date Value Ref Range Status  09/09/2022 29.3 26.6 - 33.0 pg Final  05/11/2021 29.3 26.0 - 34.0 pg Final   MCV  Date Value Ref Range Status  09/09/2022 87 79 - 97 fL Final  03/29/2015 91 80 - 100 fL Final   No results found for: "PLTCOUNTKUC", "LABPLAT", "POCPLA" RDW  Date Value Ref Range Status  09/09/2022 13.0 11.6 - 15.4 % Final  03/29/2015 13.7 11.5 - 14.5 % Final

## 2023-07-03 NOTE — Telephone Encounter (Signed)
Requested medication (s) are due for refill today: Yes  Requested medication (s) are on the active medication list: Yes  Last refill:  11/27/22  Future visit scheduled:No  Notes to clinic: See request.    Requested Prescriptions  Pending Prescriptions Disp Refills   tamsulosin (FLOMAX) 0.4 MG CAPS capsule [Pharmacy Med Name: TAMSULOSIN HCL 0.4 MG CAP] 90 capsule 1    Sig: TAKE 1 CAPSULE BY MOUTH ONCE EVERY EVENING     Urology: Alpha-Adrenergic Blocker Failed - 07/02/2023  1:44 PM      Failed - Last BP in normal range    BP Readings from Last 1 Encounters:  09/09/22 (!) 145/74         Passed - PSA in normal range and within 360 days    Prostate Specific Ag, Serum  Date Value Ref Range Status  09/09/2022 0.2 0.0 - 4.0 ng/mL Final    Comment:    Roche ECLIA methodology. According to the American Urological Association, Serum PSA should decrease and remain at undetectable levels after radical prostatectomy. The AUA defines biochemical recurrence as an initial PSA value 0.2 ng/mL or greater followed by a subsequent confirmatory PSA value 0.2 ng/mL or greater. Values obtained with different assay methods or kits cannot be used interchangeably. Results cannot be interpreted as absolute evidence of the presence or absence of malignant disease.          Passed - Valid encounter within last 12 months    Recent Outpatient Visits           9 months ago Primary hypertension   [place] Malva Limes, [facility]   1 year ago Pain in both lower extremities   [facility] Mal[facility] L[facility], [facility]   2 years ago Unilateral recurrent inguinal hernia without obstruction or gangrene   [facility]   2 years ago Controlled type 2 diabetes mellitus without complication, without long-term current use of insulin (HCC)   [facility] Malva Limes, MD   2 years ago  Bronchitis   [facility] Malva Limes, MD

## 2023-07-04 NOTE — Telephone Encounter (Signed)
Requested Prescriptions  Pending Prescriptions Disp Refills   levothyroxine (SYNTHROID) 88 MCG tablet [Pharmacy Med Name: LEVOTHYROXINE SODIUM 88 MCG TAB] 90 tablet 0    Sig: TAKE 1 TABLET BY MOUTH ONCE DAILY ON AN EMPTY STOMACH. WAIT 30 MINUTES BEFORE TAKING OTHER MEDS.     Endocrinology:  Hypothyroid Agents Passed - 07/04/2023  3:57 PM      Passed - TSH in normal range and within 360 days    TSH  Date Value Ref Range Status  09/09/2022 2.640 0.450 - 4.500 uIU/mL Final         Passed - Valid encounter within last 12 months    Recent Outpatient Visits           9 months ago Primary hypertension   [facility], [facility]   1 year ago Pain in both lower extremities   [place] Mal[facility], [facility]   2 years ago Unilateral recurrent inguinal hernia without obstruction or gangrene   [facility] Mal[facility] L[facility], MD   2 years ago Controlled type 2 diabetes mellitus without complication, without long-term current use of insulin (HCC)   [place] [facility] Mal[facility], MD   2 years ago Bronchitis   [facility] Mal[facility], [facility]       Future Appointments             In 3 weeks Sherrie Mustache, Demetrios Isaacs, MD [facility]             metoprolol tartrate (LOPRESSOR) 25 MG tablet [Pharmacy Med Name: METOPROLOL TARTRATE 25 MG TAB] 60 tablet 0    Sig: TAKE 1 TABLET BY MOUTH TWICE DAILY WITH FOOD NEED APPOINTMENT FOR FURTHER FILLS*     Cardiovascular:  Beta Blockers Failed - 07/04/2023  3:57 PM      Failed - Last BP in normal range    BP Readings from Last 1 Encounters:  09/09/22 (!) 145/74         Failed - Valid encounter within last 6 months    Recent Outpatient Visits           9 months ago Primary hypertension   [facility] Mal[facility], MD   1 year ago Pain in both lower  extremities   [facility] Malva Limes, MD   2 years ago Unilateral recurrent inguinal hernia without obstruction or gangrene   [place] Mal[facility], MD   2 years ago Controlled type 2 diabetes mellitus without complication, without long-term current use of insulin (HCC)   [facility] Malva Limes, MD   2 years ago Bronchitis   [facility] Mal[facility], [facility]       Future Appointments             In 3 weeks Sherrie Mustache, Demetrios Isaacs, MD [facility]            Passed - Last Heart Rate in normal range    Pulse Readings from Last 1 Encounters:  09/09/22 91          metFORMIN (GLUCOPHAGE-XR) 500 MG 24 hr tablet [Pharmacy Med Name: METFORMIN HCL ER 500 MG TAB] 30 tablet 0    Sig: TAKE 1 TABLET BY MOUTH ONCE DAILY WITH BREAKFAST *NEED  APPOINTMENT     Endocrinology:  Diabetes - Biguanides Failed - 07/04/2023  3:57 PM      Failed - HBA1C is between 0 and 7.9 and within 180 days    Hemoglobin A1C  Date Value Ref Range Status  03/19/2015 5.9 % Final    Comment:    4.0-6.0 NOTE: New Reference Range  02/07/15    Hgb A1c MFr Bld  Date Value Ref Range Status  09/09/2022 6.9 (H) 4.8 - 5.6 % Final    Comment:             Prediabetes: 5.7 - 6.4          Diabetes: >6.4          Glycemic control for adults with diabetes: <7.0          Failed - B12 Level in normal range and within 720 days    Vitamin B-12  Date Value Ref Range Status  10/15/2018 825 232 - 1,245 pg/mL Final         Failed - Valid encounter within last 6 months    Recent Outpatient Visits           9 months ago Primary hypertension   [facility] Mal[facility], [facility]   1 year ago Pain in both lower extremities   [facility] Malva Limes, MD   2 years ago Unilateral recurrent inguinal hernia without  obstruction or gangrene   [facility] Malva Limes, MD   2 years ago Controlled type 2 diabetes mellitus without complication, without long-term current use of insulin (HCC)   [facility] Malva Limes, MD   2 years ago Bronchitis   [place] [facility] Malva Limes, MD       Future Appointments             In 3 weeks Sherrie Mustache, Demetrios Isaacs, MD [facility]            Passed - Cr in normal range and within 360 days    Creatinine  Date Value Ref Range Status  03/29/2015 1.06 mg/dL Final    Comment:    1.61-0.96 NOTE: New Reference Range  02/07/15    Creatinine, Ser  Date Value Ref Range Status  09/09/2022 0.99 0.76 - 1.27 mg/dL Final         Passed - eGFR in normal range and within 360 days    EGFR (African American)  Date Value Ref Range Status  03/29/2015 >60  Final   GFR calc Af Amer  Date Value Ref Range Status  11/13/2020 102 >59 mL/min/1.73 Final    Comment:    **In accordance with recommendations from the NKF-ASN Task force,**   [facility] is in the process of updating its eGFR calculation to the   2021 CKD-EPI creatinine equation that estimates kidney function   without a race variable.    EGFR (Non-African Amer.)  Date Value Ref Range Status  03/29/2015 >60  Final    Comment:    eGFR values <89mL/min/1.73 m2 may be an indication of chronic kidney disease (CKD). Calculated eGFR is useful in patients with stable renal function. The eGFR calculation will not be reliable in acutely ill patients when serum creatinine is changing rapidly. It is not useful in patients on dialysis. The eGFR calculation may not be applicable to patients at the low and high extremes  of body sizes, pregnant women, and vegetarians.    GFR, Estimated  Date Value Ref Range Status  05/11/2021 >60 >60 mL/min Final    Comment:    (NOTE) Calculated using the CKD-EPI  Creatinine Equation (2021)    eGFR  Date Value Ref Range Status  09/09/2022 86 >59 mL/min/1.73 Final         Passed - CBC within normal limits and completed in the last 12 months    WBC  Date Value Ref Range Status  09/09/2022 4.7 3.4 - 10.8 x10E3/uL Final  05/11/2021 4.2 4.0 - 10.5 K/uL Final   RBC  Date Value Ref Range Status  09/09/2022 4.10 (L) 4.14 - 5.80 x10E6/uL Final  05/11/2021 4.23 4.22 - 5.81 MIL/uL Final   Hemoglobin  Date Value Ref Range Status  09/09/2022 12.0 (L) 13.0 - 17.7 g/dL Final   Hematocrit  Date Value Ref Range Status  09/09/2022 35.8 (L) 37.5 - 51.0 % Final   MCHC  Date Value Ref Range Status  09/09/2022 33.5 31.5 - 35.7 g/dL Final  40/98/1191 47.8 30.0 - 36.0 g/dL Final   [facility]  Date Value Ref Range Status  09/09/2022 29.3 26.6 - 33.0 pg Final  05/11/2021 29.3 26.0 - 34.0 pg Final   MCV  Date Value Ref Range Status  09/09/2022 87 79 - 97 fL Final  03/29/2015 91 80 - 100 fL Final   No results found for: "PLTCOUNTKUC", "LABPLAT", "POCPLA" RDW  Date Value Ref Range Status  09/09/2022 13.0 11.6 - 15.4 % Final  03/29/2015 13.7 11.5 - 14.5 % Final

## 2023-07-04 NOTE — Telephone Encounter (Signed)
Pharmacy request 90-day supply. This was filled as a 30-day courtesy  Requested Prescriptions  Pending Prescriptions Disp Refills   metoprolol tartrate (LOPRESSOR) 25 MG tablet [Pharmacy Med Name: METOPROLOL TARTRATE 25 MG TAB] 60 tablet 0    Sig: TAKE 1 TABLET BY MOUTH TWICE DAILY WITH FOOD NEED APPOINTMENT FOR FURTHER FILLS*     Cardiovascular:  Beta Blockers Failed - 07/04/2023  3:57 PM      Failed - Last BP in normal range    BP Readings from Last 1 Encounters:  09/09/22 (!) 145/74         Failed - Valid encounter within last 6 months    Recent Outpatient Visits           9 months ago Primary hypertension   [place] [facility] Malva Limes, MD   1 year ago Pain in both lower extremities   [facility] Malva Limes, MD   2 years ago Unilateral recurrent inguinal hernia without obstruction or gangrene   [place] [facility] Malva L[facility], MD   2 years ago Controlled type 2 diabetes mellitus without complication, without long-term current use of insulin (HCC)   [facility] Malva Limes, MD   2 years ago Bronchitis   [facility] Mal[facility], [facility]       Future Appointments             In 3 weeks Fisher, Demetrios Isaacs, MD [facility]            Passed - Last Heart Rate in normal range    Pulse Readings from Last 1 Encounters:  09/09/22 91          metFORMIN (GLUCOPHAGE-XR) 500 MG 24 hr tablet [Pharmacy Med Name: METFORMIN HCL ER 500 MG TAB] 30 tablet 0    Sig: TAKE 1 TABLET BY MOUTH ONCE DAILY WITH BREAKFAST *NEED APPOINTMENT     Endocrinology:  Diabetes - Biguanides Failed - 07/04/2023  3:57 PM      Failed - HBA1C is between 0 and 7.9 and within 180 days    Hemoglobin A1C  Date Value Ref Range Status  03/19/2015 5.9 % Final    Comment:    4.0-6.0 NOTE: New Reference Range  02/07/15    Hgb A1c MFr Bld   Date Value Ref Range Status  09/09/2022 6.9 (H) 4.8 - 5.6 % Final    Comment:             Prediabetes: 5.7 - 6.4          Diabetes: >6.4          Glycemic control for adults with diabetes: <7.0          Failed - B12 Level in normal range and within 720 days    Vitamin B-12  Date Value Ref Range Status  10/15/2018 825 232 - 1,245 pg/mL Final         Failed - Valid encounter within last 6 months    Recent Outpatient Visits           9 months ago Primary hypertension   [place] Mal[facility] L[facility], MD   1 year ago Pain in both lower extremities   [place] [facility] Malva Limes, MD   2 years ago Unilateral recurrent inguinal hernia without obstruction or gangrene   [facility] Malva Limes, MD   2 years ago Controlled type 2 diabetes mellitus without complication, without long-term current use of insulin (HCC)   [place] [facility] Malva Limes, MD   2 years ago Bronchitis   [place] [facility] Malva Limes, MD       Future Appointments             In 3 weeks Sherrie Mustache, Demetrios Isaacs, MD [facility]            Passed - Cr in normal range and within 360 days    Creatinine  Date Value Ref Range Status  03/29/2015 1.06 mg/dL Final    Comment:    1.61-0.96 NOTE: New Reference Range  02/07/15    Creatinine, Ser  Date Value Ref Range Status  09/09/2022 0.99 0.76 - 1.27 mg/dL Final         Passed - eGFR in normal range and within 360 days    EGFR (African American)  Date Value Ref Range Status  03/29/2015 >60  Final   GFR calc Af Amer  Date Value Ref Range Status  11/13/2020 102 >59 mL/min/1.73 Final    Comment:    **In accordance with recommendations from the NKF-ASN Task force,**   [facility] is in the process of updating its eGFR calculation to the   2021 CKD-EPI creatinine equation that estimates kidney  function   without a race variable.    EGFR (Non-African Amer.)  Date Value Ref Range Status  03/29/2015 >60  Final    Comment:    eGFR values <47mL/min/1.73 m2 may be an indication of chronic kidney disease (CKD). Calculated eGFR is useful in patients with stable renal function. The eGFR calculation will not be reliable in acutely ill patients when serum creatinine is changing rapidly. It is not useful in patients on dialysis. The eGFR calculation may not be applicable to patients at the low and high extremes of body sizes, pregnant women, and vegetarians.    GFR, Estimated  Date Value Ref Range Status  05/11/2021 >60 >60 mL/min Final    Comment:    (NOTE) Calculated using the CKD-EPI Creatinine Equation (2021)    eGFR  Date Value Ref Range Status  09/09/2022 86 >59 mL/min/1.73 Final         Passed - CBC within normal limits and completed in the last 12 months    WBC  Date Value Ref Range Status  09/09/2022 4.7 3.4 - 10.8 x10E3/uL Final  05/11/2021 4.2 4.0 - 10.5 K/uL Final   RBC  Date Value Ref Range Status  09/09/2022 4.10 (L) 4.14 - 5.80 x10E6/uL Final  05/11/2021 4.23 4.22 - 5.81 MIL/uL Final   Hemoglobin  Date Value Ref Range Status  09/09/2022 12.0 (L) 13.0 - 17.7 g/dL Final   Hematocrit  Date Value Ref Range Status  09/09/2022 35.8 (L) 37.5 - 51.0 % Final   MCHC  Date Value Ref Range Status  09/09/2022 33.5 31.5 - 35.7 g/dL Final  04/54/0981 19.1 30.0 - 36.0 g/dL Final   [facility]  Date Value Ref Range Status  09/09/2022 29.3 26.6 - 33.0 pg Final  05/11/2021 29.3 26.0 - 34.0 pg Final   MCV  Date Value Ref Range Status  09/09/2022 87 79 - 97 fL Final  03/29/2015 91 80 - 100 fL Final   No results found for: "PLTCOUNTKUC", "LABPLAT", "POCPLA" RDW  Date Value Ref Range Status  09/09/2022 13.0  11.6 - 15.4 % Final  03/29/2015 13.7 11.5 - 14.5 % Final         Signed Prescriptions Disp Refills   levothyroxine (SYNTHROID) 88 MCG tablet 90 tablet 0     Sig: TAKE 1 TABLET BY MOUTH ONCE DAILY ON AN EMPTY STOMACH. WAIT 30 MINUTES BEFORE TAKING OTHER MEDS.     Endocrinology:  Hypothyroid Agents Passed - 07/04/2023  3:57 PM      Passed - TSH in normal range and within 360 days    TSH  Date Value Ref Range Status  09/09/2022 2.640 0.450 - 4.500 uIU/mL Final         Passed - Valid encounter within last 12 months    Recent Outpatient Visits           9 months ago Primary hypertension   [place] [facility]   1 year ago Pain in both lower extremities   [place] [facility] Malva Limes, MD   2 years ago Unilateral recurrent inguinal hernia without obstruction or gangrene   [place] [facility] Malva Limes, MD   2 years ago Controlled type 2 diabetes mellitus without complication, without long-term current use of insulin (HCC)   [place] Malva Limes, MD   2 years ago Bronchitis   [facility] Malva Limes, MD       Future Appointments             In 3 weeks Fisher, Demetrios Isaacs, MD [facility]

## 2023-07-04 NOTE — Telephone Encounter (Signed)
Appt scheduled 07/25/23

## 2023-07-25 ENCOUNTER — Ambulatory Visit (INDEPENDENT_AMBULATORY_CARE_PROVIDER_SITE_OTHER): Payer: Medicare Other | Admitting: Family Medicine

## 2023-07-25 ENCOUNTER — Encounter: Payer: Self-pay | Admitting: Family Medicine

## 2023-07-25 VITALS — BP 105/62 | HR 86 | Temp 97.8°F | Resp 16 | Ht 71.0 in | Wt 230.0 lb

## 2023-07-25 DIAGNOSIS — E559 Vitamin D deficiency, unspecified: Secondary | ICD-10-CM

## 2023-07-25 DIAGNOSIS — E785 Hyperlipidemia, unspecified: Secondary | ICD-10-CM

## 2023-07-25 DIAGNOSIS — F1121 Opioid dependence, in remission: Secondary | ICD-10-CM

## 2023-07-25 DIAGNOSIS — I10 Essential (primary) hypertension: Secondary | ICD-10-CM | POA: Diagnosis not present

## 2023-07-25 DIAGNOSIS — E039 Hypothyroidism, unspecified: Secondary | ICD-10-CM | POA: Diagnosis not present

## 2023-07-25 DIAGNOSIS — K7460 Unspecified cirrhosis of liver: Secondary | ICD-10-CM

## 2023-07-25 DIAGNOSIS — E119 Type 2 diabetes mellitus without complications: Secondary | ICD-10-CM | POA: Diagnosis not present

## 2023-07-25 MED ORDER — RIFAXIMIN 550 MG PO TABS: 550.0000 mg | ORAL_TABLET | Freq: Two times a day (BID) | ORAL | 3 refills | Status: AC

## 2023-07-27 LAB — MICROALBUMIN / CREATININE URINE RATIO
Creatinine, Urine: 176 mg/dL
Microalb/Creat Ratio: 11 mg/g{creat} (ref 0–29)
Microalbumin, Urine: 19 ug/mL

## 2023-07-27 LAB — COMPREHENSIVE METABOLIC PANEL
ALT: 14 IU/L (ref 0–44)
AST: 18 IU/L (ref 0–40)
Albumin: 4.4 g/dL (ref 3.9–4.9)
Alkaline Phosphatase: 92 IU/L (ref 44–121)
BUN/Creatinine Ratio: 29 — ABNORMAL HIGH (ref 10–24)
BUN: 33 mg/dL — ABNORMAL HIGH (ref 8–27)
Bilirubin Total: 0.3 mg/dL (ref 0.0–1.2)
CO2: 21 mmol/L (ref 20–29)
Calcium: 8.8 mg/dL (ref 8.6–10.2)
Chloride: 105 mmol/L (ref 96–106)
Creatinine, Ser: 1.15 mg/dL (ref 0.76–1.27)
Globulin, Total: 2.3 g/dL (ref 1.5–4.5)
Glucose: 123 mg/dL — ABNORMAL HIGH (ref 70–99)
Potassium: 4.9 mmol/L (ref 3.5–5.2)
Sodium: 139 mmol/L (ref 134–144)
Total Protein: 6.7 g/dL (ref 6.0–8.5)
eGFR: 72 mL/min/{1.73_m2} (ref >59)

## 2023-07-27 LAB — AMMONIA: Ammonia: 51 ug/dL (ref 40–200)

## 2023-07-27 LAB — VITAMIN D 25 HYDROXY (VIT D DEFICIENCY, FRACTURES): Vit D, 25-Hydroxy: 27.5 ng/mL — ABNORMAL LOW (ref 30.0–100.0)

## 2023-07-27 LAB — LIPID PANEL
Chol/HDL Ratio: 2.1 ratio (ref 0.0–5.0)
Cholesterol, Total: 103 mg/dL (ref 100–199)
HDL: 49 mg/dL (ref >39)
LDL Chol Calc (NIH): 39 mg/dL (ref 0–99)
Triglycerides: 74 mg/dL (ref 0–149)
VLDL Cholesterol Cal: 15 mg/dL (ref 5–40)

## 2023-07-27 LAB — HEMOGLOBIN A1C
Est. average glucose Bld gHb Est-mCnc: 143 mg/dL
Hgb A1c MFr Bld: 6.6 % — ABNORMAL HIGH (ref 4.8–5.6)

## 2023-07-27 LAB — T4, FREE: Free T4: 1.04 ng/dL (ref 0.82–1.77)

## 2023-07-27 LAB — TSH: TSH: 1.41 u[IU]/mL (ref 0.450–4.500)

## 2023-07-28 ENCOUNTER — Telehealth: Payer: Self-pay | Admitting: Family Medicine

## 2023-07-28 NOTE — Progress Notes (Signed)
Established patient visit   Patient: Ray Robles   DOB: 1961/09/04   62 y.o. Male  MRN: [id number] Visit Date: 07/25/2023  Today's healthcare provider: Mila Merry, MD   Chief Complaint  Patient presents with   Medical Management of Chronic Issues    Patient reports good compliance and tolerance to medications. He reports his insurance is not covering Suboxone, has been out for one month.    Subjective    Discussed the use of AI scribe software for clinical note transcription with the patient, who gave verbal consent to proceed.  History of Present Illness   The patient, with a history of diabetes, thyroid disease, and hypertension, reports that his blood sugar levels have been within normal limits at home. He denies experiencing any hypoglycemic episodes. He is currently on metformin, which he tolerates well, and takes once daily.  The patient has been having difficulty obtaining his Xifaxan,  He has history  of cirrhosis with multiple episodes of hepatic encephalopathy leading to inpatient care in the past. The patient reports that this medication is crucial in preventing hospitalizations by binding toxins for elimination. He takes lactulose only as needed, but does not tolerate taking it on daily basis, and is not able to take frequenctly enough to precent episodes of encephalopathy.   The patient has not seen an eye doctor in the past year due to insurance issues. He reports significant changes in his vision and expresses a need for an eye examination.       Medications: Outpatient Medications Prior to Visit  Medication Sig   Accu-Chek Softclix Lancets lancets Use to check blood sugar daily for type 2 diabetes   albuterol (VENTOLIN HFA) 108 (90 Base) MCG/ACT inhaler Inhale 2 puffs into the lungs every 6 (six) hours as needed for wheezing or shortness of breath.   aspirin-acetaminophen-caffeine (EXCEDRIN MIGRAINE) 250-250-65 MG tablet Take 2-3 tablets by mouth every 6  (six) hours as needed for headache.   diphenoxylate-atropine (LOMOTIL) 2.5-0.025 MG tablet Take 2 tablets by mouth every 6 (six) hours as needed for diarrhea or loose stools.    ezetimibe (ZETIA) 10 MG tablet TAKE 1 TABLET BY MOUTH ONCE DAILY   furosemide (LASIX) 20 MG tablet Take 20 mg by mouth daily.   glucose blood (ACCU-CHEK GUIDE) test strip Use to check blood sugar daily for type 2 diabetes   lactulose (CHRONULAC) 10 GM/15ML solution TAKE 30 MLS BY MOUTH 3 TIMES DAILY (Patient taking differently: Take 20 g by mouth 3 (three) times daily.)   Lancets Misc. (ACCU-CHEK SOFTCLIX LANCET DEV) KIT Use to check blood sugar daily for type 2 diabetes   levothyroxine (SYNTHROID) 88 MCG tablet TAKE 1 TABLET BY MOUTH ONCE DAILY ON AN EMPTY STOMACH. WAIT 30 MINUTES BEFORE TAKING OTHER MEDS.   Melatonin 10 MG CAPS Take 10 mg at bedtime by mouth.   metFORMIN (GLUCOPHAGE-XR) 500 MG 24 hr tablet TAKE 1 TABLET BY MOUTH ONCE DAILY WITH BREAKFAST   metoprolol tartrate (LOPRESSOR) 25 MG tablet TAKE 1 TABLET BY MOUTH TWICE DAILY WITH FOOD   metroNIDAZOLE (METROCREAM) 0.75 % cream APPLY TOPICALLY AT BEDTIME (Patient taking differently: Apply 1 application  topically at bedtime.)   naloxone (NARCAN) nasal spray 4 mg/0.1 mL Place 1 spray into the nose as needed (opioid overdose).   nitroGLYCERIN (NITROSTAT) 0.4 MG SL tablet Place 0.4 mg under the tongue every 5 (five) minutes as needed for chest pain.   nystatin cream (MYCOSTATIN) Apply 1 application 3 (  three) times daily as needed topically (for rash).    omeprazole (PRILOSEC) 40 MG capsule TAKE 1 CAPSULE BY MOUTH ONCE DAILY   ondansetron (ZOFRAN-ODT) 4 MG disintegrating tablet Take 4 mg by mouth every 8 (eight) hours as needed for vomiting or nausea.   OXYGEN Inhale into the lungs.   QUEtiapine (SEROQUEL) 300 MG tablet TAKE 1 TABLET BY MOUTH AT BEDTIME   spironolactone (ALDACTONE) 100 MG tablet Take 100 mg by mouth daily.   tamsulosin (FLOMAX) 0.4 MG CAPS capsule  TAKE 1 CAPSULE BY MOUTH ONCE EVERY EVENING   triamcinolone cream (KENALOG) 0.1 % APPLY TOPICALLY TWICE DAILY AS NEEDED (Patient taking differently: Apply 1 application  topically 2 (two) times daily as needed (rash).)   Vitamin D, Ergocalciferol, (DRISDOL) 1.25 MG (50000 UNIT) CAPS capsule Take 50,000 Units by mouth every Monday.   [DISCONTINUED] rifaximin (XIFAXAN) 550 MG TABS tablet Take 550 mg by mouth 2 (two) times daily.   buprenorphine-naloxone (SUBOXONE) 2-0.5 mg SUBL SL tablet Place 1-2 tablets See admin instructions under the tongue. Take 2 tablets by mouth in the morning, take 1 tablet by mouth midday and take 2 tablets by mouth at bedtime (Patient not taking: Reported on 07/25/2023)   prazosin (MINIPRESS) 5 MG capsule Take 1 capsule (5 mg total) by mouth at bedtime.   venlafaxine XR (EFFEXOR-XR) 150 MG 24 hr capsule Take 1 capsule (150 mg total) by mouth daily. Take with 75 mg capsule to equal 225 mg once daily   venlafaxine XR (EFFEXOR-XR) 75 MG 24 hr capsule Take 1 capsule (75 mg total) by mouth daily. Take with 150 mg dose to equal 225 mg once daily   [DISCONTINUED] prazosin (MINIPRESS) 1 MG capsule Take 1 capsule (1 mg total) by mouth at bedtime.   No facility-administered medications prior to visit.   Review of Systems     Objective    BP 105/62 (BP Location: Left Arm, Patient Position: Sitting, Cuff Size: Large)   Pulse 86   Temp 97.8 F (36.6 C) (Temporal)   Resp 16   Ht 5\' 11"  (1.803 m)   Wt 230 lb (104.3 kg)   SpO2 98%   BMI 32.08 kg/m   Physical Exam  Physical Exam   CHEST: Lungs clear to auscultation. CARDIOVASCULAR: Heart sounds normal.     Results for orders placed or performed in visit on 07/25/23  Comprehensive metabolic panel  Result Value Ref Range   Glucose 123 (H) 70 - 99 mg/dL   BUN 33 (H) 8 - 27 mg/dL   Creatinine, Ser 2.13 0.76 - 1.27 mg/dL   eGFR 72 >08 MV/HQI/6.96   BUN/Creatinine Ratio 29 (H) 10 - 24   Sodium 139 134 - 144 mmol/L    Potassium 4.9 3.5 - 5.2 mmol/L   Chloride 105 96 - 106 mmol/L   CO2 21 20 - 29 mmol/L   Calcium 8.8 8.6 - 10.2 mg/dL   Total Protein 6.7 6.0 - 8.5 g/dL   Albumin 4.4 3.9 - 4.9 g/dL   Globulin, Total 2.3 1.5 - 4.5 g/dL   Bilirubin Total 0.3 0.0 - 1.2 mg/dL   Alkaline Phosphatase 92 44 - 121 IU/L   AST 18 0 - 40 IU/L   ALT 14 0 - 44 IU/L  Lipid panel  Result Value Ref Range   Cholesterol, Total 103 100 - 199 mg/dL   Triglycerides 74 0 - 149 mg/dL   HDL 49 >29 mg/dL   VLDL Cholesterol Cal 15 5 - 40 mg/dL  LDL Chol Calc (NIH) 39 0 - 99 mg/dL   Chol/HDL Ratio 2.1 0.0 - 5.0 ratio  Hemoglobin A1c  Result Value Ref Range   Hgb A1c MFr Bld 6.6 (H) 4.8 - 5.6 %   Est. average glucose Bld gHb Est-mCnc 143 mg/dL  Urine microalbumin-creatinine with uACR  Result Value Ref Range   Creatinine, Urine 176.0 Not Estab. mg/dL   Microalbumin, Urine 52.8 Not Estab. ug/mL   Microalb/Creat Ratio 11 0 - 29 mg/g creat  T4, free  Result Value Ref Range   Free T4 1.04 0.82 - 1.77 ng/dL  TSH  Result Value Ref Range   TSH 1.410 0.450 - 4.500 uIU/mL  VITAMIN D 25 Hydroxy (Vit-D Deficiency, Fractures)  Result Value Ref Range   Vit D, 25-Hydroxy 27.5 (L) 30.0 - 100.0 ng/mL  Ammonia  Result Value Ref Range   Ammonia 51 40 - 200 ug/dL    Assessment & Plan     Assessment and Plan    Type 2 Diabetes Mellitus Reports good control at home with Metformin. A1c not checked due to machine malfunction. -Continue Metformin 1 tablet daily. -Check A1c and fasting glucose at lab.  Hepatic Encephalopathy Difficulty obtaining Xifaxan which is required to precent episodes requiring inpatient care. he does not tolerate maintenance doses of lactulose which he can only take in as needed in addition to maintenance doses of Xifaxan.   Visual Changes Reports significant changes in vision. Has not seen an eye doctor in over a year due to insurance issues. -Attempt to find an eye doctor who accepts patient's  insurance.  General Health Maintenance -Perform diabetic kidney disease screening with urinalysis. -Check fasting lipid panel at lab.      - rifaximin (XIFAXAN) 550 MG TABS tablet; Take 1 tablet (550 mg total) by mouth 2 (two) times daily.  Dispense: 60 tablet; Refill: 3   Hypothyroidism, unspecified type  - T4, free - TSH   Hyperlipidemia, unspecified hyperlipidemia type  - Comprehensive metabolic panel - Lipid panel  Primary hypertension   Vitamin D deficiency  - VITAMIN D 25 Hydroxy (Vit-D Deficiency, Fractures)  Opioid use disorder, moderate, in sustained remission (HCC)       Mila Merry, MD  [facility] [phone] (phone) [phone] (fax)  [facility]

## 2023-07-28 NOTE — Telephone Encounter (Signed)
Received a fax from covermymeds for Xifaxan 550mg   Key: [id number]

## 2023-08-01 NOTE — Telephone Encounter (Signed)
Ray Robles (KeyBeverely Pace) Rx #: [id number] Xifaxan 550MG  tablets Form [facility] Medicare Electronic Prior Authorization Request Form [phone] NCPDP) 4 days ago Message from Plan Approved. This drug has been approved under the Member's Medicare Part D benefit for XIFAXAN Tablet 550MG . Approved quantity: 60 per 30 day(s).

## 2023-08-01 NOTE — Telephone Encounter (Signed)
[facility] pharmacy advised.

## 2023-08-02 ENCOUNTER — Other Ambulatory Visit: Payer: Self-pay | Admitting: Family Medicine

## 2023-08-02 DIAGNOSIS — E119 Type 2 diabetes mellitus without complications: Secondary | ICD-10-CM

## 2023-08-02 DIAGNOSIS — I10 Essential (primary) hypertension: Secondary | ICD-10-CM

## 2023-08-05 NOTE — Telephone Encounter (Signed)
Requested Prescriptions  Pending Prescriptions Disp Refills   metFORMIN (GLUCOPHAGE-XR) 500 MG 24 hr tablet [Pharmacy Med Name: METFORMIN HCL ER 500 MG TAB] 30 tablet 0    Sig: Take 1 tablet (500 mg total) by mouth daily with breakfast.     Endocrinology:  Diabetes - Biguanides Failed - 08/02/2023 12:01 PM      Failed - B12 Level in normal range and within 720 days    Vitamin B-12  Date Value Ref Range Status  10/15/2018 825 232 - 1,245 pg/mL Final         Passed - Cr in normal range and within 360 days    Creatinine  Date Value Ref Range Status  03/29/2015 1.06 mg/dL Final    Comment:    6.44-0.34 NOTE: New Reference Range  02/07/15    Creatinine, Ser  Date Value Ref Range Status  07/25/2023 1.15 0.76 - 1.27 mg/dL Final         Passed - HBA1C is between 0 and 7.9 and within 180 days    Hemoglobin A1C  Date Value Ref Range Status  03/19/2015 5.9 % Final    Comment:    4.0-6.0 NOTE: New Reference Range  02/07/15    Hgb A1c MFr Bld  Date Value Ref Range Status  07/25/2023 6.6 (H) 4.8 - 5.6 % Final    Comment:             Prediabetes: 5.7 - 6.4          Diabetes: >6.4          Glycemic control for adults with diabetes: <7.0          Passed - eGFR in normal range and within 360 days    EGFR (African American)  Date Value Ref Range Status  03/29/2015 >60  Final   GFR calc Af Amer  Date Value Ref Range Status  11/13/2020 102 >59 mL/min/1.73 Final    Comment:    **In accordance with recommendations from the NKF-ASN Task force,**   [facility] is in the process of updating its eGFR calculation to the   2021 CKD-EPI creatinine equation that estimates kidney function   without a race variable.    EGFR (Non-African Amer.)  Date Value Ref Range Status  03/29/2015 >60  Final    Comment:    eGFR values <50mL/min/1.73 m2 may be an indication of chronic kidney disease (CKD). Calculated eGFR is useful in patients with stable renal function. The eGFR calculation will  not be reliable in acutely ill patients when serum creatinine is changing rapidly. It is not useful in patients on dialysis. The eGFR calculation may not be applicable to patients at the low and high extremes of body sizes, pregnant women, and vegetarians.    GFR, Estimated  Date Value Ref Range Status  05/11/2021 >60 >60 mL/min Final    Comment:    (NOTE) Calculated using the CKD-EPI Creatinine Equation (2021)    eGFR  Date Value Ref Range Status  07/25/2023 72 >59 mL/min/1.73 Final         Passed - Valid encounter within last 6 months    Recent Outpatient Visits           1 week ago Controlled type 2 diabetes mellitus without complication, without long-term current use of insulin (HCC)   [facility] Malva Limes, MD   11 months ago Primary hypertension   [facility] Malva Limes, MD  1 year ago Pain in both lower extremities   [facility] Malva Limes, MD   2 years ago Unilateral recurrent inguinal hernia without obstruction or gangrene   [facility] Malva Limes, MD   2 years ago Controlled type 2 diabetes mellitus without complication, without long-term current use of insulin (HCC)   [place] [facility] Malva Limes, MD              Passed - CBC within normal limits and completed in the last 12 months    WBC  Date Value Ref Range Status  09/09/2022 4.7 3.4 - 10.8 x10E3/uL Final  05/11/2021 4.2 4.0 - 10.5 K/uL Final   RBC  Date Value Ref Range Status  09/09/2022 4.10 (L) 4.14 - 5.80 x10E6/uL Final  05/11/2021 4.23 4.22 - 5.81 MIL/uL Final   Hemoglobin  Date Value Ref Range Status  09/09/2022 12.0 (L) 13.0 - 17.7 g/dL Final   Hematocrit  Date Value Ref Range Status  09/09/2022 35.8 (L) 37.5 - 51.0 % Final   MCHC  Date Value Ref Range Status  09/09/2022 33.5 31.5 - 35.7 g/dL Final  16/09/9603 54.0 30.0 - 36.0  g/dL Final   [facility]  Date Value Ref Range Status  09/09/2022 29.3 26.6 - 33.0 pg Final  05/11/2021 29.3 26.0 - 34.0 pg Final   MCV  Date Value Ref Range Status  09/09/2022 87 79 - 97 fL Final  03/29/2015 91 80 - 100 fL Final   No results found for: "PLTCOUNTKUC", "LABPLAT", "POCPLA" RDW  Date Value Ref Range Status  09/09/2022 13.0 11.6 - 15.4 % Final  03/29/2015 13.7 11.5 - 14.5 % Final          metoprolol tartrate (LOPRESSOR) 25 MG tablet [Pharmacy Med Name: METOPROLOL TARTRATE 25 MG TAB] 60 tablet 0    Sig: Take 1 tablet (25 mg total) by mouth 2 (two) times daily.     Cardiovascular:  Beta Blockers Passed - 08/02/2023 12:01 PM      Passed - Last BP in normal range    BP Readings from Last 1 Encounters:  07/25/23 105/62         Passed - Last Heart Rate in normal range    Pulse Readings from Last 1 Encounters:  07/25/23 86         Passed - Valid encounter within last 6 months    Recent Outpatient Visits           1 week ago Controlled type 2 diabetes mellitus without complication, without long-term current use of insulin (HCC)   [place] [facility] Malva Limes, MD   11 months ago Primary hypertension   [place] [facility] Malva Limes, MD   1 year ago Pain in both lower extremities   [facility] Malva Limes, MD   2 years ago Unilateral recurrent inguinal hernia without obstruction or gangrene   [place] Malva Limes, MD   2 years ago Controlled type 2 diabetes mellitus without complication, without long-term current use of insulin (HCC)   [facility] Sherrie Mustache, Demetrios Isaacs, MD

## 2023-09-09 ENCOUNTER — Other Ambulatory Visit: Payer: Self-pay | Admitting: Family Medicine

## 2023-09-09 DIAGNOSIS — F3342 Major depressive disorder, recurrent, in full remission: Secondary | ICD-10-CM

## 2023-09-09 DIAGNOSIS — F5101 Primary insomnia: Secondary | ICD-10-CM

## 2023-09-09 DIAGNOSIS — E785 Hyperlipidemia, unspecified: Secondary | ICD-10-CM

## 2023-09-09 NOTE — Telephone Encounter (Signed)
Requested Prescriptions  Pending Prescriptions Disp Refills   QUEtiapine (SEROQUEL) 300 MG tablet [Pharmacy Med Name: QUETIAPINE FUMARATE 300 MG TAB] 90 tablet 4    Sig: TAKE 1 TABLET BY MOUTH AT BEDTIME     Not Delegated - Psychiatry:  Antipsychotics - Second Generation (Atypical) - quetiapine Failed - 09/09/2023 11:50 AM      Failed - This refill cannot be delegated      Failed - Lipid Panel in normal range within the last 12 months    Cholesterol, Total  Date Value Ref Range Status  07/25/2023 103 100 - 199 mg/dL Final   LDL Chol Calc (NIH)  Date Value Ref Range Status  07/25/2023 39 0 - 99 mg/dL Final   HDL  Date Value Ref Range Status  07/25/2023 49 >39 mg/dL Final   Triglycerides  Date Value Ref Range Status  07/25/2023 74 0 - 149 mg/dL Final         Failed - CBC within normal limits and completed in the last 12 months    WBC  Date Value Ref Range Status  09/09/2022 4.7 3.4 - 10.8 x10E3/uL Final  05/11/2021 4.2 4.0 - 10.5 K/uL Final   RBC  Date Value Ref Range Status  09/09/2022 4.10 (L) 4.14 - 5.80 x10E6/uL Final  05/11/2021 4.23 4.22 - 5.81 MIL/uL Final   Hemoglobin  Date Value Ref Range Status  09/09/2022 12.0 (L) 13.0 - 17.7 g/dL Final   Hematocrit  Date Value Ref Range Status  09/09/2022 35.8 (L) 37.5 - 51.0 % Final   MCHC  Date Value Ref Range Status  09/09/2022 33.5 31.5 - 35.7 g/dL Final  29/56/2130 86.5 30.0 - 36.0 g/dL Final   [facility]  Date Value Ref Range Status  09/09/2022 29.3 26.6 - 33.0 pg Final  05/11/2021 29.3 26.0 - 34.0 pg Final   MCV  Date Value Ref Range Status  09/09/2022 87 79 - 97 fL Final  03/29/2015 91 80 - 100 fL Final   No results found for: "PLTCOUNTKUC", "LABPLAT", "POCPLA" RDW  Date Value Ref Range Status  09/09/2022 13.0 11.6 - 15.4 % Final  03/29/2015 13.7 11.5 - 14.5 % Final         Passed - TSH in normal range and within 360 days    TSH  Date Value Ref Range Status  07/25/2023 1.410 0.450 - 4.500 uIU/mL Final          Passed - Completed PHQ-2 or PHQ-9 in the last 360 days      Passed - Last BP in normal range    BP Readings from Last 1 Encounters:  07/25/23 105/62         Passed - Last Heart Rate in normal range    Pulse Readings from Last 1 Encounters:  07/25/23 86         Passed - Valid encounter within last 6 months    Recent Outpatient Visits           1 month ago Controlled type 2 diabetes mellitus without complication, without long-term current use of insulin (HCC)   [place] [facility] Malva Limes, MD   1 year ago Primary hypertension   [place] [facility] Malva Limes, MD   1 year ago Pain in both lower extremities   [place] [facility] Malva Limes, MD   2 years ago Unilateral recurrent inguinal hernia without obstruction or gangrene    [facility]  Malva Limes, MD   2 years ago Controlled type 2 diabetes mellitus without complication, without long-term current use of insulin [facility])   [facility] [place] [facility] Malva Limes, MD              Passed - CMP within normal limits and completed in the last 12 months    Albumin  Date Value Ref Range Status  07/25/2023 4.4 3.9 - 4.9 g/dL Final  16/09/9603 3.2 (L) g/dL Final    Comment:    5.4-0.9 NOTE: New reference range  02/07/15    Alkaline Phosphatase  Date Value Ref Range Status  07/25/2023 92 44 - 121 IU/L Final  03/30/2015 160 (H) U/L Final    Comment:    38-126 NOTE: New Reference Range  02/07/15    ALT  Date Value Ref Range Status  07/25/2023 14 0 - 44 IU/L Final   SGPT (ALT)  Date Value Ref Range Status  03/30/2015 54 U/L Final    Comment:    17-63 NOTE: New Reference Range  02/07/15    AST  Date Value Ref Range Status  07/25/2023 18 0 - 40 IU/L Final   SGOT(AST)  Date Value Ref Range Status  03/30/2015 77 (H) U/L Final    Comment:    15-41 NOTE: New Reference Range   02/07/15    BUN  Date Value Ref Range Status  07/25/2023 33 (H) 8 - 27 mg/dL Final  81/19/1478 24 (H) mg/dL Final    Comment:    2-95 NOTE: New Reference Range  02/07/15    Calcium  Date Value Ref Range Status  07/25/2023 8.8 8.6 - 10.2 mg/dL Final   Calcium, Total  Date Value Ref Range Status  03/29/2015 8.8 (L) mg/dL Final    Comment:    6.2-13.0 NOTE: New Reference Range  02/07/15    CO2  Date Value Ref Range Status  07/25/2023 21 20 - 29 mmol/L Final   Co2  Date Value Ref Range Status  03/29/2015 23 mmol/L Final    Comment:    22-32 NOTE: New Reference Range  02/07/15    Bicarbonate  Date Value Ref Range Status  02/12/2017 28.9 (H) 20.0 - 28.0 mmol/L Final   Creatinine  Date Value Ref Range Status  03/29/2015 1.06 mg/dL Final    Comment:    8.65-7.84 NOTE: New Reference Range  02/07/15    Creatinine, Ser  Date Value Ref Range Status  07/25/2023 1.15 0.76 - 1.27 mg/dL Final   Glucose  Date Value Ref Range Status  07/25/2023 123 (H) 70 - 99 mg/dL Final  69/62/9528 413 (H) mg/dL Final    Comment:    24-40 NOTE: New Reference Range  02/07/15   02/24/2015 155 mg/dL Final   Glucose, Bld  Date Value Ref Range Status  05/11/2021 134 (H) 70 - 99 mg/dL Final    Comment:    Glucose reference range applies only to samples taken after fasting for at least 8 hours.   Glucose-Capillary  Date Value Ref Range Status  05/14/2021 229 (H) 70 - 99 mg/dL Final    Comment:    Glucose reference range applies only to samples taken after fasting for at least 8 hours.   Potassium  Date Value Ref Range Status  07/25/2023 4.9 3.5 - 5.2 mmol/L Final  03/29/2015 3.6 mmol/L Final    Comment:    3.5-5.1 NOTE: New Reference Range  02/07/15    Sodium  Date Value Ref  Range Status  07/25/2023 139 134 - 144 mmol/L Final  03/29/2015 136 mmol/L Final    Comment:    135-145 NOTE: New Reference Range  02/07/15    Bilirubin,Total  Date Value Ref Range  Status  03/30/2015 0.5 mg/dL Final    Comment:    5.7-8.4 NOTE: New Reference Range  02/07/15    Bilirubin Total  Date Value Ref Range Status  07/25/2023 0.3 0.0 - 1.2 mg/dL Final   Bilirubin, Direct  Date Value Ref Range Status  03/30/2015 0.2 mg/dL Final    Comment:    6.9-6.2 NOTE: New Reference Range  02/07/15    Indirect Bilirubin  Date Value Ref Range Status  03/30/2015 0.3  Final   Protein, ur  Date Value Ref Range Status  02/12/2017 NEGATIVE NEGATIVE mg/dL Final   Total Protein  Date Value Ref Range Status  07/25/2023 6.7 6.0 - 8.5 g/dL Final  95/28/4132 6.6 g/dL Final    Comment:    4.4-0.1 NOTE: New Reference Range  02/07/15    EGFR (African American)  Date Value Ref Range Status  03/29/2015 >60  Final   GFR calc Af Amer  Date Value Ref Range Status  11/13/2020 102 >59 mL/min/1.73 Final    Comment:    **In accordance with recommendations from the NKF-ASN Task force,**   [facility] is in the process of updating its eGFR calculation to the   2021 CKD-EPI creatinine equation that estimates kidney function   without a race variable.    eGFR  Date Value Ref Range Status  07/25/2023 72 >59 mL/min/1.73 Final   EGFR (Non-African Amer.)  Date Value Ref Range Status  03/29/2015 >60  Final    Comment:    eGFR values <1mL/min/1.73 m2 may be an indication of chronic kidney disease (CKD). Calculated eGFR is useful in patients with stable renal function. The eGFR calculation will not be reliable in acutely ill patients when serum creatinine is changing rapidly. It is not useful in patients on dialysis. The eGFR calculation may not be applicable to patients at the low and high extremes of body sizes, pregnant women, and vegetarians.    GFR, Estimated  Date Value Ref Range Status  05/11/2021 >60 >60 mL/min Final    Comment:    (NOTE) Calculated using the CKD-EPI Creatinine Equation (2021)           ezetimibe (ZETIA) 10 MG tablet [Pharmacy Med  Name: EZETIMIBE 10 MG TAB] 90 tablet 0    Sig: TAKE 1 TABLET BY MOUTH ONCE DAILY     Cardiovascular:  Antilipid - Sterol Transport Inhibitors Failed - 09/09/2023 11:50 AM      Failed - Lipid Panel in normal range within the last 12 months    Cholesterol, Total  Date Value Ref Range Status  07/25/2023 103 100 - 199 mg/dL Final   LDL Chol Calc (NIH)  Date Value Ref Range Status  07/25/2023 39 0 - 99 mg/dL Final   HDL  Date Value Ref Range Status  07/25/2023 49 >39 mg/dL Final   Triglycerides  Date Value Ref Range Status  07/25/2023 74 0 - 149 mg/dL Final         Passed - AST in normal range and within 360 days    AST  Date Value Ref Range Status  07/25/2023 18 0 - 40 IU/L Final   SGOT(AST)  Date Value Ref Range Status  03/30/2015 77 (H) U/L Final    Comment:    15-41 NOTE: New  Reference Range  02/07/15          Passed - ALT in normal range and within 360 days    ALT  Date Value Ref Range Status  07/25/2023 14 0 - 44 IU/L Final   SGPT (ALT)  Date Value Ref Range Status  03/30/2015 54 U/L Final    Comment:    17-63 NOTE: New Reference Range  02/07/15          Passed - Patient is not pregnant      Passed - Valid encounter within last 12 months    Recent Outpatient Visits           1 month ago Controlled type 2 diabetes mellitus without complication, without long-term current use of insulin (HCC)   [place] [facility] Malva Limes, MD   1 year ago Primary hypertension   [facility] Malva Limes, MD   1 year ago Pain in both lower extremities   [place] [facility] Malva Limes, MD   2 years ago Unilateral recurrent inguinal hernia without obstruction or gangrene   [facility] Malva Limes, MD   2 years ago Controlled type 2 diabetes mellitus without complication, without long-term current use of insulin (HCC)   [place] [facility]  Sherrie Mustache, Demetrios Isaacs, MD

## 2023-09-09 NOTE — Telephone Encounter (Signed)
Requested medication (s) are due for refill today: yes  Requested medication (s) are on the active medication list: yes  Last refill:  10/16/22  Future visit scheduled: yes  Notes to clinic:  Unable to refill per protocol, cannot delegate.      Requested Prescriptions  Pending Prescriptions Disp Refills   QUEtiapine (SEROQUEL) 300 MG tablet [Pharmacy Med Name: QUETIAPINE FUMARATE 300 MG TAB] 90 tablet 4    Sig: TAKE 1 TABLET BY MOUTH AT BEDTIME     Not Delegated - Psychiatry:  Antipsychotics - Second Generation (Atypical) - quetiapine Failed - 09/09/2023 11:50 AM      Failed - This refill cannot be delegated      Failed - Lipid Panel in normal range within the last 12 months    Cholesterol, Total  Date Value Ref Range Status  07/25/2023 103 100 - 199 mg/dL Final   LDL Chol Calc (NIH)  Date Value Ref Range Status  07/25/2023 39 0 - 99 mg/dL Final   HDL  Date Value Ref Range Status  07/25/2023 49 >39 mg/dL Final   Triglycerides  Date Value Ref Range Status  07/25/2023 74 0 - 149 mg/dL Final         Failed - CBC within normal limits and completed in the last 12 months    WBC  Date Value Ref Range Status  09/09/2022 4.7 3.4 - 10.8 x10E3/uL Final  05/11/2021 4.2 4.0 - 10.5 K/uL Final   RBC  Date Value Ref Range Status  09/09/2022 4.10 (L) 4.14 - 5.80 x10E6/uL Final  05/11/2021 4.23 4.22 - 5.81 MIL/uL Final   Hemoglobin  Date Value Ref Range Status  09/09/2022 12.0 (L) 13.0 - 17.7 g/dL Final   Hematocrit  Date Value Ref Range Status  09/09/2022 35.8 (L) 37.5 - 51.0 % Final   MCHC  Date Value Ref Range Status  09/09/2022 33.5 31.5 - 35.7 g/dL Final  40/98/1191 47.8 30.0 - 36.0 g/dL Final   [facility]  Date Value Ref Range Status  09/09/2022 29.3 26.6 - 33.0 pg Final  05/11/2021 29.3 26.0 - 34.0 pg Final   MCV  Date Value Ref Range Status  09/09/2022 87 79 - 97 fL Final  03/29/2015 91 80 - 100 fL Final   No results found for: "PLTCOUNTKUC", "LABPLAT",  "POCPLA" RDW  Date Value Ref Range Status  09/09/2022 13.0 11.6 - 15.4 % Final  03/29/2015 13.7 11.5 - 14.5 % Final         Passed - TSH in normal range and within 360 days    TSH  Date Value Ref Range Status  07/25/2023 1.410 0.450 - 4.500 uIU/mL Final         Passed - Completed PHQ-2 or PHQ-9 in the last 360 days      Passed - Last BP in normal range    BP Readings from Last 1 Encounters:  07/25/23 105/62         Passed - Last Heart Rate in normal range    Pulse Readings from Last 1 Encounters:  07/25/23 86         Passed - Valid encounter within last 6 months    Recent Outpatient Visits           1 month ago Controlled type 2 diabetes mellitus without complication, without long-term current use of insulin (HCC)   [place] [facility] Malva Limes, MD   1 year ago Primary hypertension   [facility]   1 year ago Pain in both lower extremities   [place] [facility] [facility] Malva Limes, MD   2 years ago Unilateral recurrent inguinal hernia without obstruction or gangrene   [place] [facility] Malva Limes, MD   2 years ago Controlled type 2 diabetes mellitus without complication, without long-term current use of insulin (HCC)   [place] [facility] Malva Limes, MD              Passed - CMP within normal limits and completed in the last 12 months    Albumin  Date Value Ref Range Status  07/25/2023 4.4 3.9 - 4.9 g/dL Final  18/84/1660 3.2 (L) g/dL Final    Comment:    6.3-0.1 NOTE: New reference range  02/07/15    Alkaline Phosphatase  Date Value Ref Range Status  07/25/2023 92 44 - 121 IU/L Final  03/30/2015 160 (H) U/L Final    Comment:    38-126 NOTE: New Reference Range  02/07/15    ALT  Date Value Ref Range Status  07/25/2023 14 0 - 44 IU/L Final   SGPT (ALT)  Date Value Ref Range Status  03/30/2015 54 U/L Final     Comment:    17-63 NOTE: New Reference Range  02/07/15    AST  Date Value Ref Range Status  07/25/2023 18 0 - 40 IU/L Final   SGOT(AST)  Date Value Ref Range Status  03/30/2015 77 (H) U/L Final    Comment:    15-41 NOTE: New Reference Range  02/07/15    BUN  Date Value Ref Range Status  07/25/2023 33 (H) 8 - 27 mg/dL Final  60/09/9322 24 (H) mg/dL Final    Comment:    5-57 NOTE: New Reference Range  02/07/15    Calcium  Date Value Ref Range Status  07/25/2023 8.8 8.6 - 10.2 mg/dL Final   Calcium, Total  Date Value Ref Range Status  03/29/2015 8.8 (L) mg/dL Final    Comment:    3.2-20.2 NOTE: New Reference Range  02/07/15    CO2  Date Value Ref Range Status  07/25/2023 21 20 - 29 mmol/L Final   Co2  Date Value Ref Range Status  03/29/2015 23 mmol/L Final    Comment:    22-32 NOTE: New Reference Range  02/07/15    Bicarbonate  Date Value Ref Range Status  02/12/2017 28.9 (H) 20.0 - 28.0 mmol/L Final   Creatinine  Date Value Ref Range Status  03/29/2015 1.06 mg/dL Final    Comment:    5.42-7.06 NOTE: New Reference Range  02/07/15    Creatinine, Ser  Date Value Ref Range Status  07/25/2023 1.15 0.76 - 1.27 mg/dL Final   Glucose  Date Value Ref Range Status  07/25/2023 123 (H) 70 - 99 mg/dL Final  23/76/2831 517 (H) mg/dL Final    Comment:    61-60 NOTE: New Reference Range  02/07/15   02/24/2015 155 mg/dL Final   Glucose, Bld  Date Value Ref Range Status  05/11/2021 134 (H) 70 - 99 mg/dL Final    Comment:    Glucose reference range applies only to samples taken after fasting for at least 8 hours.   Glucose-Capillary  Date Value Ref Range Status  05/14/2021 229 (H) 70 - 99 mg/dL Final    Comment:    Glucose reference range applies only to samples taken after fasting for at  least 8 hours.   Potassium  Date Value Ref Range Status  07/25/2023 4.9 3.5 - 5.2 mmol/L Final  03/29/2015 3.6 mmol/L Final    Comment:     3.5-5.1 NOTE: New Reference Range  02/07/15    Sodium  Date Value Ref Range Status  07/25/2023 139 134 - 144 mmol/L Final  03/29/2015 136 mmol/L Final    Comment:    135-145 NOTE: New Reference Range  02/07/15    Bilirubin,Total  Date Value Ref Range Status  03/30/2015 0.5 mg/dL Final    Comment:    8.4-6.9 NOTE: New Reference Range  02/07/15    Bilirubin Total  Date Value Ref Range Status  07/25/2023 0.3 0.0 - 1.2 mg/dL Final   Bilirubin, Direct  Date Value Ref Range Status  03/30/2015 0.2 mg/dL Final    Comment:    6.2-9.5 NOTE: New Reference Range  02/07/15    Indirect Bilirubin  Date Value Ref Range Status  03/30/2015 0.3  Final   Protein, ur  Date Value Ref Range Status  02/12/2017 NEGATIVE NEGATIVE mg/dL Final   Total Protein  Date Value Ref Range Status  07/25/2023 6.7 6.0 - 8.5 g/dL Final  28/41/3244 6.6 g/dL Final    Comment:    0.1-0.2 NOTE: New Reference Range  02/07/15    EGFR (African American)  Date Value Ref Range Status  03/29/2015 >60  Final   GFR calc Af Amer  Date Value Ref Range Status  11/13/2020 102 >59 mL/min/1.73 Final    Comment:    **In accordance with recommendations from the NKF-ASN Task force,**   [facility] is in the process of updating its eGFR calculation to the   2021 CKD-EPI creatinine equation that estimates kidney function   without a race variable.    eGFR  Date Value Ref Range Status  07/25/2023 72 >59 mL/min/1.73 Final   EGFR (Non-African Amer.)  Date Value Ref Range Status  03/29/2015 >60  Final    Comment:    eGFR values <59mL/min/1.73 m2 may be an indication of chronic kidney disease (CKD). Calculated eGFR is useful in patients with stable renal function. The eGFR calculation will not be reliable in acutely ill patients when serum creatinine is changing rapidly. It is not useful in patients on dialysis. The eGFR calculation may not be applicable to patients at the low and high extremes of body  sizes, pregnant women, and vegetarians.    GFR, Estimated  Date Value Ref Range Status  05/11/2021 >60 >60 mL/min Final    Comment:    (NOTE) Calculated using the CKD-EPI Creatinine Equation (2021)          Signed Prescriptions Disp Refills   ezetimibe (ZETIA) 10 MG tablet 90 tablet 0    Sig: TAKE 1 TABLET BY MOUTH ONCE DAILY     Cardiovascular:  Antilipid - Sterol Transport Inhibitors Failed - 09/09/2023 11:50 AM      Failed - Lipid Panel in normal range within the last 12 months    Cholesterol, Total  Date Value Ref Range Status  07/25/2023 103 100 - 199 mg/dL Final   LDL Chol Calc (NIH)  Date Value Ref Range Status  07/25/2023 39 0 - 99 mg/dL Final   HDL  Date Value Ref Range Status  07/25/2023 49 >39 mg/dL Final   Triglycerides  Date Value Ref Range Status  07/25/2023 74 0 - 149 mg/dL Final         Passed - AST in normal range and within  360 days    AST  Date Value Ref Range Status  07/25/2023 18 0 - 40 IU/L Final   SGOT(AST)  Date Value Ref Range Status  03/30/2015 77 (H) U/L Final    Comment:    15-41 NOTE: New Reference Range  02/07/15          Passed - ALT in normal range and within 360 days    ALT  Date Value Ref Range Status  07/25/2023 14 0 - 44 IU/L Final   SGPT (ALT)  Date Value Ref Range Status  03/30/2015 54 U/L Final    Comment:    17-63 NOTE: New Reference Range  02/07/15          Passed - Patient is not pregnant      Passed - Valid encounter within last 12 months    Recent Outpatient Visits           1 month ago Controlled type 2 diabetes mellitus without complication, without long-term current use of insulin (HCC)   [place] [facility] Malva Limes, MD   1 year ago Primary hypertension   [place] [facility] Malva Limes, MD   1 year ago Pain in both lower extremities   [facility] [facility] Mal[facility], [facility]   2 years ago Unilateral recurrent inguinal  hernia without obstruction or gangrene   [facility] Malva Limes, MD   2 years ago Controlled type 2 diabetes mellitus without complication, without long-term current use of insulin (HCC)   [place] [facility] Sherrie Mustache, Demetrios Isaacs, MD

## 2023-09-17 LAB — HM DIABETES EYE EXAM

## 2023-10-13 ENCOUNTER — Other Ambulatory Visit: Payer: Self-pay | Admitting: Family Medicine

## 2023-11-11 ENCOUNTER — Other Ambulatory Visit: Payer: Self-pay | Admitting: Family Medicine

## 2023-11-11 DIAGNOSIS — E119 Type 2 diabetes mellitus without complications: Secondary | ICD-10-CM

## 2023-11-11 DIAGNOSIS — I10 Essential (primary) hypertension: Secondary | ICD-10-CM

## 2023-11-12 NOTE — Telephone Encounter (Signed)
Requested by interface surescripts. Future visit in 3 months.  Requested Prescriptions  Pending Prescriptions Disp Refills   metoprolol tartrate (LOPRESSOR) 25 MG tablet [Pharmacy Med Name: METOPROLOL TARTRATE 25 MG TAB] 180 tablet 0    Sig: TAKE 1 TABLET BY MOUTH TWICE DAILY WITH FOOD NEED APPOINTMENT FOR FURTHER FILLS*     Cardiovascular:  Beta Blockers Passed - 11/11/2023  4:10 PM      Passed - Last BP in normal range    BP Readings from Last 1 Encounters:  07/25/23 105/62         Passed - Last Heart Rate in normal range    Pulse Readings from Last 1 Encounters:  07/25/23 86         Passed - Valid encounter within last 6 months    Recent Outpatient Visits           3 months ago Controlled type 2 diabetes mellitus without complication, without long-term current use of insulin (HCC)   [place] [facility] Mal[facility] L[facility], MD   1 year ago Primary hypertension   [place] [facility] Malva Limes, MD   1 year ago Pain in both lower extremities   [place] [facility] Malva Limes, MD   2 years ago Unilateral recurrent inguinal hernia without obstruction or gangrene   [place] [facility] Malva Limes, MD   2 years ago Controlled type 2 diabetes mellitus without complication, without long-term current use of insulin (HCC)   [place] [facility] Malva Limes, MD               metFORMIN (GLUCOPHAGE-XR) 500 MG 24 hr tablet [Pharmacy Med Name: METFORMIN HCL ER 500 MG TAB] 90 tablet 1    Sig: TAKE 1 TABLET BY MOUTH ONCE DAILY WITH BREAKFAST *NEED APPOINTMENT     Endocrinology:  Diabetes - Biguanides Failed - 11/11/2023  4:10 PM      Failed - B12 Level in normal range and within 720 days    Vitamin B-12  Date Value Ref Range Status  10/15/2018 825 232 - 1,245 pg/mL Final         Failed - CBC within normal limits and completed in the last 12 months    WBC  Date Value Ref  Range Status  09/09/2022 4.7 3.4 - 10.8 x10E3/uL Final  05/11/2021 4.2 4.0 - 10.5 K/uL Final   RBC  Date Value Ref Range Status  09/09/2022 4.10 (L) 4.14 - 5.80 x10E6/uL Final  05/11/2021 4.23 4.22 - 5.81 MIL/uL Final   Hemoglobin  Date Value Ref Range Status  09/09/2022 12.0 (L) 13.0 - 17.7 g/dL Final   Hematocrit  Date Value Ref Range Status  09/09/2022 35.8 (L) 37.5 - 51.0 % Final   MCHC  Date Value Ref Range Status  09/09/2022 33.5 31.5 - 35.7 g/dL Final  32/95/1884 16.6 30.0 - 36.0 g/dL Final   [facility]  Date Value Ref Range Status  09/09/2022 29.3 26.6 - 33.0 pg Final  05/11/2021 29.3 26.0 - 34.0 pg Final   MCV  Date Value Ref Range Status  09/09/2022 87 79 - 97 fL Final  03/29/2015 91 80 - 100 fL Final   No results found for: "PLTCOUNTKUC", "LABPLAT", "POCPLA" RDW  Date Value Ref Range Status  09/09/2022 13.0 11.6 - 15.4 % Final  03/29/2015 13.7 11.5 - 14.5 % Final         Passed - Cr in  normal range and within 360 days    Creatinine  Date Value Ref Range Status  03/29/2015 1.06 mg/dL Final    Comment:    0.98-1.19 NOTE: New Reference Range  02/07/15    Creatinine, Ser  Date Value Ref Range Status  07/25/2023 1.15 0.76 - 1.27 mg/dL Final         Passed - HBA1C is between 0 and 7.9 and within 180 days    Hemoglobin A1C  Date Value Ref Range Status  03/19/2015 5.9 % Final    Comment:    4.0-6.0 NOTE: New Reference Range  02/07/15    Hgb A1c MFr Bld  Date Value Ref Range Status  07/25/2023 6.6 (H) 4.8 - 5.6 % Final    Comment:             Prediabetes: 5.7 - 6.4          Diabetes: >6.4          Glycemic control for adults with diabetes: <7.0          Passed - eGFR in normal range and within 360 days    EGFR (African American)  Date Value Ref Range Status  03/29/2015 >60  Final   GFR calc Af Amer  Date Value Ref Range Status  11/13/2020 102 >59 mL/min/1.73 Final    Comment:    **In accordance with recommendations from the NKF-ASN Task  force,**   [facility] is in the process of updating its eGFR calculation to the   2021 CKD-EPI creatinine equation that estimates kidney function   without a race variable.    EGFR (Non-African Amer.)  Date Value Ref Range Status  03/29/2015 >60  Final    Comment:    eGFR values <6mL/min/1.73 m2 may be an indication of chronic kidney disease (CKD). Calculated eGFR is useful in patients with stable renal function. The eGFR calculation will not be reliable in acutely ill patients when serum creatinine is changing rapidly. It is not useful in patients on dialysis. The eGFR calculation may not be applicable to patients at the low and high extremes of body sizes, pregnant women, and vegetarians.    GFR, Estimated  Date Value Ref Range Status  05/11/2021 >60 >60 mL/min Final    Comment:    (NOTE) Calculated using the CKD-EPI Creatinine Equation (2021)    eGFR  Date Value Ref Range Status  07/25/2023 72 >59 mL/min/1.73 Final         Passed - Valid encounter within last 6 months    Recent Outpatient Visits           3 months ago Controlled type 2 diabetes mellitus without complication, without long-term current use of insulin (HCC)   [place] [facility] Malva Limes, MD   1 year ago Primary hypertension   [place] [facility] Malva Limes, MD   1 year ago Pain in both lower extremities   [place] [facility] Malva Limes, MD   2 years ago Unilateral recurrent inguinal hernia without obstruction or gangrene   [place] [facility] Malva Limes, MD   2 years ago Controlled type 2 diabetes mellitus without complication, without long-term current use of insulin (HCC)   [place] [facility] Malva Limes, MD               tamsulosin (FLOMAX) 0.4 MG CAPS capsule [Pharmacy Med Name: TAMSULOSIN HCL 0.4 MG CAP] 90 capsule 0  Sig: TAKE 1 CAPSULE BY MOUTH ONCE EVERY EVENING      Urology: Alpha-Adrenergic Blocker Failed - 11/11/2023  4:10 PM      Failed - PSA in normal range and within 360 days    Prostate Specific Ag, Serum  Date Value Ref Range Status  09/09/2022 0.2 0.0 - 4.0 ng/mL Final    Comment:    Roche ECLIA methodology. According to the American Urological Association, Serum PSA should decrease and remain at undetectable levels after radical prostatectomy. The AUA defines biochemical recurrence as an initial PSA value 0.2 ng/mL or greater followed by a subsequent confirmatory PSA value 0.2 ng/mL or greater. Values obtained with different assay methods or kits cannot be used interchangeably. Results cannot be interpreted as absolute evidence of the presence or absence of malignant disease.          Passed - Last BP in normal range    BP Readings from Last 1 Encounters:  07/25/23 105/62         Passed - Valid encounter within last 12 months    Recent Outpatient Visits           3 months ago Controlled type 2 diabetes mellitus without complication, without long-term current use of insulin (HCC)   [facility] Mal[facility] L[facility], MD   1 year ago Primary hypertension   [place] [facility] Malva Limes, MD   1 year ago Pain in both lower extremities   [place] [facility] Malva Limes, MD   2 years ago Unilateral recurrent inguinal hernia without obstruction or gangrene   [facility] Malva Limes, MD   2 years ago Controlled type 2 diabetes mellitus without complication, without long-term current use of insulin [facility])   [place] [facility] Sherrie Mustache, Demetrios Isaacs, MD

## 2023-11-12 NOTE — Telephone Encounter (Signed)
Requested medication (s) are due for refill today: yes   Requested medication (s) are on the active medication list: yes   Last refill:  metformin- 08/05/23 #90 1 refills, flomax- 07/04/23 #90 0 refills   Future visit scheduled: yes in 3 months   Notes to clinic:  protocol failed last labs 09/09/22 . Do you want to refill Rxs?     Requested Prescriptions  Pending Prescriptions Disp Refills   metFORMIN (GLUCOPHAGE-XR) 500 MG 24 hr tablet [Pharmacy Med Name: METFORMIN HCL ER 500 MG TAB] 90 tablet 1    Sig: TAKE 1 TABLET BY MOUTH ONCE DAILY WITH BREAKFAST *NEED APPOINTMENT     Endocrinology:  Diabetes - Biguanides Failed - 11/11/2023  4:10 PM      Failed - B12 Level in normal range and within 720 days    Vitamin B-12  Date Value Ref Range Status  10/15/2018 825 232 - 1,245 pg/mL Final         Failed - CBC within normal limits and completed in the last 12 months    WBC  Date Value Ref Range Status  09/09/2022 4.7 3.4 - 10.8 x10E3/uL Final  05/11/2021 4.2 4.0 - 10.5 K/uL Final   RBC  Date Value Ref Range Status  09/09/2022 4.10 (L) 4.14 - 5.80 x10E6/uL Final  05/11/2021 4.23 4.22 - 5.81 MIL/uL Final   Hemoglobin  Date Value Ref Range Status  09/09/2022 12.0 (L) 13.0 - 17.7 g/dL Final   Hematocrit  Date Value Ref Range Status  09/09/2022 35.8 (L) 37.5 - 51.0 % Final   MCHC  Date Value Ref Range Status  09/09/2022 33.5 31.5 - 35.7 g/dL Final  16/09/9603 54.0 30.0 - 36.0 g/dL Final   [facility]  Date Value Ref Range Status  09/09/2022 29.3 26.6 - 33.0 pg Final  05/11/2021 29.3 26.0 - 34.0 pg Final   MCV  Date Value Ref Range Status  09/09/2022 87 79 - 97 fL Final  03/29/2015 91 80 - 100 fL Final   No results found for: "PLTCOUNTKUC", "LABPLAT", "POCPLA" RDW  Date Value Ref Range Status  09/09/2022 13.0 11.6 - 15.4 % Final  03/29/2015 13.7 11.5 - 14.5 % Final         Passed - Cr in normal range and within 360 days    Creatinine  Date Value Ref Range Status  03/29/2015  1.06 mg/dL Final    Comment:    9.81-1.91 NOTE: New Reference Range  02/07/15    Creatinine, Ser  Date Value Ref Range Status  07/25/2023 1.15 0.76 - 1.27 mg/dL Final         Passed - HBA1C is between 0 and 7.9 and within 180 days    Hemoglobin A1C  Date Value Ref Range Status  03/19/2015 5.9 % Final    Comment:    4.0-6.0 NOTE: New Reference Range  02/07/15    Hgb A1c MFr Bld  Date Value Ref Range Status  07/25/2023 6.6 (H) 4.8 - 5.6 % Final    Comment:             Prediabetes: 5.7 - 6.4          Diabetes: >6.4          Glycemic control for adults with diabetes: <7.0          Passed - eGFR in normal range and within 360 days    EGFR (African American)  Date Value Ref Range Status  03/29/2015 >60  Final  GFR calc Af Amer  Date Value Ref Range Status  11/13/2020 102 >59 mL/min/1.73 Final    Comment:    **In accordance with recommendations from the NKF-ASN Task force,**   [facility] is in the process of updating its eGFR calculation to the   2021 CKD-EPI creatinine equation that estimates kidney function   without a race variable.    EGFR (Non-African Amer.)  Date Value Ref Range Status  03/29/2015 >60  Final    Comment:    eGFR values <42mL/min/1.73 m2 may be an indication of chronic kidney disease (CKD). Calculated eGFR is useful in patients with stable renal function. The eGFR calculation will not be reliable in acutely ill patients when serum creatinine is changing rapidly. It is not useful in patients on dialysis. The eGFR calculation may not be applicable to patients at the low and high extremes of body sizes, pregnant women, and vegetarians.    GFR, Estimated  Date Value Ref Range Status  05/11/2021 >60 >60 mL/min Final    Comment:    (NOTE) Calculated using the CKD-EPI Creatinine Equation (2021)    eGFR  Date Value Ref Range Status  07/25/2023 72 >59 mL/min/1.73 Final         Passed - Valid encounter within last 6 months    Recent  Outpatient Visits           3 months ago Controlled type 2 diabetes mellitus without complication, without long-term current use of insulin (HCC)   [place] [facility] Malva Limes, MD   1 year ago Primary hypertension   [place] [facility] Malva Limes, MD   1 year ago Pain in both lower extremities   [place] [facility] Malva Limes, MD   2 years ago Unilateral recurrent inguinal hernia without obstruction or gangrene   [place] [facility] Malva Limes, MD   2 years ago Controlled type 2 diabetes mellitus without complication, without long-term current use of insulin (HCC)   [place] [facility] Malva Limes, MD               tamsulosin (FLOMAX) 0.4 MG CAPS capsule [Pharmacy Med Name: TAMSULOSIN HCL 0.4 MG CAP] 90 capsule 0    Sig: TAKE 1 CAPSULE BY MOUTH ONCE EVERY EVENING     Urology: Alpha-Adrenergic Blocker Failed - 11/11/2023  4:10 PM      Failed - PSA in normal range and within 360 days    Prostate Specific Ag, Serum  Date Value Ref Range Status  09/09/2022 0.2 0.0 - 4.0 ng/mL Final    Comment:    Roche ECLIA methodology. According to the American Urological Association, Serum PSA should decrease and remain at undetectable levels after radical prostatectomy. The AUA defines biochemical recurrence as an initial PSA value 0.2 ng/mL or greater followed by a subsequent confirmatory PSA value 0.2 ng/mL or greater. Values obtained with different assay methods or kits cannot be used interchangeably. Results cannot be interpreted as absolute evidence of the presence or absence of malignant disease.          Passed - Last BP in normal range    BP Readings from Last 1 Encounters:  07/25/23 105/62         Passed - Valid encounter within last 12 months    Recent Outpatient Visits           3 months ago Controlled type 2 diabetes mellitus without  complication, without long-term current use of insulin (HCC)   [place] [facility] Malva Limes, MD   1 year ago Primary hypertension   [place] [facility] Malva Limes, MD   1 year ago Pain in both lower extremities   [place] [facility] Malva Limes, MD   2 years ago Unilateral recurrent inguinal hernia without obstruction or gangrene   [facility] Malva Limes, MD   2 years ago Controlled type 2 diabetes mellitus without complication, without long-term current use of insulin (HCC)   [place] [facility] Malva Limes, MD              Signed Prescriptions Disp Refills   metoprolol tartrate (LOPRESSOR) 25 MG tablet 180 tablet 0    Sig: TAKE 1 TABLET BY MOUTH TWICE DAILY WITH FOOD NEED APPOINTMENT FOR FURTHER FILLS*     Cardiovascular:  Beta Blockers Passed - 11/11/2023  4:10 PM      Passed - Last BP in normal range    BP Readings from Last 1 Encounters:  07/25/23 105/62         Passed - Last Heart Rate in normal range    Pulse Readings from Last 1 Encounters:  07/25/23 86         Passed - Valid encounter within last 6 months    Recent Outpatient Visits           3 months ago Controlled type 2 diabetes mellitus without complication, without long-term current use of insulin (HCC)   [place] [facility] Malva Limes, MD   1 year ago Primary hypertension   [place] [facility] Malva Limes, MD   1 year ago Pain in both lower extremities   [place] [facility] Malva Limes, MD   2 years ago Unilateral recurrent inguinal hernia without obstruction or gangrene   [place] [facility] Malva Limes, MD   2 years ago Controlled type 2 diabetes mellitus without complication, without long-term current use of insulin (HCC)    [place] [facility] Sherrie Mustache, Demetrios Isaacs, MD

## 2024-01-13 ENCOUNTER — Other Ambulatory Visit: Payer: Self-pay | Admitting: Family Medicine

## 2024-02-10 ENCOUNTER — Other Ambulatory Visit: Payer: Self-pay | Admitting: Family Medicine

## 2024-02-10 DIAGNOSIS — I10 Essential (primary) hypertension: Secondary | ICD-10-CM

## 2024-02-20 ENCOUNTER — Other Ambulatory Visit: Payer: Self-pay | Admitting: Family Medicine

## 2024-02-20 DIAGNOSIS — E119 Type 2 diabetes mellitus without complications: Secondary | ICD-10-CM

## 2024-03-31 ENCOUNTER — Ambulatory Visit: Payer: Self-pay

## 2024-03-31 DIAGNOSIS — Z0000 Encounter for general adult medical examination without abnormal findings: Secondary | ICD-10-CM

## 2024-03-31 DIAGNOSIS — E119 Type 2 diabetes mellitus without complications: Secondary | ICD-10-CM

## 2024-03-31 NOTE — Patient Instructions (Addendum)
 Mr. Ray Robles , Thank you for taking time to come for your Medicare Wellness Visit. I appreciate your ongoing commitment to your health goals. Please review the following plan we discussed and let me know if I can assist you in the future.   Referrals/Orders/Follow-Ups/Clinician Recommendations: NEEDS FOOT EXAM  This is a list of the screening recommended for you and due dates:  Health Maintenance  Topic Date Due   COVID-19 Vaccine (1) Never done   Complete foot exam   Never done   Pneumococcal Vaccination (2 of 2 - PCV) 10/30/2010   DTaP/Tdap/Td vaccine (2 - Td or Tdap) 10/28/2021   Hemoglobin A1C  01/25/2024   Flu Shot  07/02/2024   Yearly kidney function blood test for diabetes  07/24/2024   Yearly kidney health urinalysis for diabetes  07/24/2024   Eye exam for diabetics  09/16/2024   Medicare Annual Wellness Visit  03/31/2025   Colon Cancer Screening  05/11/2025   Hepatitis C Screening  Completed   HIV Screening  Completed   Zoster (Shingles) Vaccine  Completed   HPV Vaccine  Aged Out   Meningitis B Vaccine  Aged Out    Advanced directives: (ACP Link)Information on Advanced Care Planning can be found at [place]  Print production planner Health Care Directives Advance Health Care Directives. [email or web address]   Next Medicare Annual Wellness Visit scheduled for next year: Yes   04/06/25 @ 1:10 PM BY PHONE

## 2024-03-31 NOTE — Progress Notes (Signed)
 Subjective:   Ray Robles is a 63 y.o. who presents for a Medicare Wellness preventive visit.  Visit Complete: Virtual I connected with  Ray Robles on 03/31/24 by a audio enabled telemedicine application and verified that I am speaking with the correct person using two identifiers.  Patient Location: Home  Provider Location: Home Office  I discussed the limitations of evaluation and management by telemedicine. The patient expressed understanding and agreed to proceed.  Vital Signs: Because this visit was a virtual/telehealth visit, some criteria may be missing or patient reported. Any vitals not documented were not able to be obtained and vitals that have been documented are patient reported.  VideoDeclined- This patient declined Librarian, academic. Therefore the visit was completed with audio only.  Persons Participating in Visit: Patient.  AWV Questionnaire: No: Patient Medicare AWV questionnaire was not completed prior to this visit.  Cardiac Risk Factors include: advanced age (>50men, >62 women);diabetes mellitus;dyslipidemia;male gender;hypertension;obesity (BMI >30kg/m2)     Objective:    Today's Vitals   03/31/24 1303  PainSc: 4    There is no height or weight on file to calculate BMI.     03/31/2024    1:11 PM 03/31/2023   11:30 AM 05/14/2021    6:29 AM 05/10/2021    1:53 PM 04/20/2021   12:54 PM 01/29/2021    4:04 PM 10/27/2020    6:06 PM  Advanced Directives  Does Patient Have a Medical Advance Directive? No No No No No No No  Would patient like information on creating a medical advance directive? No - Patient declined  No - Patient declined No - Patient declined No - Patient declined No - Patient declined     Current Medications (verified) Outpatient Encounter Medications as of 03/31/2024  Medication Sig   Accu-Chek Softclix Lancets lancets Use to check blood sugar daily for type 2 diabetes   albuterol  (VENTOLIN  HFA) 108 (90  Base) MCG/ACT inhaler Inhale 2 puffs into the lungs every 6 (six) hours as needed for wheezing or shortness of breath.   aspirin-acetaminophen -caffeine (EXCEDRIN MIGRAINE) 250-250-65 MG tablet Take 2-3 tablets by mouth every 6 (six) hours as needed for headache.   buprenorphine-naloxone  (SUBOXONE) 2-0.5 mg SUBL SL tablet Place 1-2 tablets under the tongue See admin instructions. Take 2 tablets by mouth in the morning, take 1 tablet by mouth midday and take 2 tablets by mouth at bedtime   diphenoxylate -atropine  (LOMOTIL ) 2.5-0.025 MG tablet Take 2 tablets by mouth every 6 (six) hours as needed for diarrhea or loose stools.    ezetimibe  (ZETIA ) 10 MG tablet TAKE 1 TABLET BY MOUTH ONCE DAILY   furosemide  (LASIX ) 20 MG tablet Take 20 mg by mouth daily.   glucose blood (ACCU-CHEK GUIDE) test strip Use to check blood sugar daily for type 2 diabetes   lactulose  (CHRONULAC ) 10 GM/15ML solution TAKE 30 MLS BY MOUTH 3 TIMES DAILY (Patient taking differently: Take 20 g by mouth 3 (three) times daily.)   Lancets Misc. (ACCU-CHEK SOFTCLIX LANCET DEV) KIT Use to check blood sugar daily for type 2 diabetes   levothyroxine  (SYNTHROID ) 88 MCG tablet TAKE 1 TABLET BY MOUTH ONCE DAILY ON AN EMPTY STOMACH. WAIT 30 MINUTES BEFORE TAKING OTHER MEDS.   Melatonin 10 MG CAPS Take 10 mg at bedtime by mouth.   metFORMIN  (GLUCOPHAGE -XR) 500 MG 24 hr tablet Take 1 tablet (500 mg total) by mouth daily with breakfast.   metoprolol  tartrate (LOPRESSOR ) 25 MG tablet TAKE 1 TABLET  BY MOUTH TWICE DAILY WITH FOOD NEED APPOINTMENT FOR FURTHER FILLS*   metroNIDAZOLE  (METROCREAM ) 0.75 % cream APPLY TOPICALLY AT BEDTIME (Patient taking differently: Apply 1 application  topically at bedtime.)   naloxone  (NARCAN ) nasal spray 4 mg/0.1 mL Place 1 spray into the nose as needed (opioid overdose).   nitroGLYCERIN  (NITROSTAT ) 0.4 MG SL tablet Place 0.4 mg under the tongue every 5 (five) minutes as needed for chest pain.   nystatin  cream  (MYCOSTATIN ) Apply 1 application 3 (three) times daily as needed topically (for rash).    omeprazole  (PRILOSEC) 40 MG capsule TAKE 1 CAPSULE BY MOUTH ONCE DAILY   ondansetron  (ZOFRAN -ODT) 4 MG disintegrating tablet Take 4 mg by mouth every 8 (eight) hours as needed for vomiting or nausea.   OXYGEN  Inhale into the lungs.   QUEtiapine  (SEROQUEL ) 300 MG tablet TAKE 1 TABLET BY MOUTH AT BEDTIME   rifaximin  (XIFAXAN ) 550 MG TABS tablet Take 1 tablet (550 mg total) by mouth 2 (two) times daily.   spironolactone  (ALDACTONE ) 100 MG tablet Take 100 mg by mouth daily.   tamsulosin  (FLOMAX ) 0.4 MG CAPS capsule TAKE 1 CAPSULE BY MOUTH ONCE DAILY 30 MINUTES AFTER LARGEST MEAL. *PATIENT NEEDS APPOINTMENT FOR FURTHER REFILLS   triamcinolone  cream (KENALOG ) 0.1 % APPLY TOPICALLY TWICE DAILY AS NEEDED (Patient taking differently: Apply 1 application  topically 2 (two) times daily as needed (rash).)   Vitamin D , Ergocalciferol , (DRISDOL) 1.25 MG (50000 UNIT) CAPS capsule Take 50,000 Units by mouth every Monday.   prazosin  (MINIPRESS ) 5 MG capsule Take 1 capsule (5 mg total) by mouth at bedtime.   venlafaxine  XR (EFFEXOR -XR) 150 MG 24 hr capsule Take 1 capsule (150 mg total) by mouth daily. Take with 75 mg capsule to equal 225 mg once daily   venlafaxine  XR (EFFEXOR -XR) 75 MG 24 hr capsule Take 1 capsule (75 mg total) by mouth daily. Take with 150 mg dose to equal 225 mg once daily   No facility-administered encounter medications on file as of 03/31/2024.    Allergies (verified) Ambien [zolpidem tartrate], Ibuprofen , Morphine, Tylenol  [acetaminophen ], Xanax [alprazolam], and Zaleplon    History: Past Medical History:  Diagnosis Date   Anxiety    Arthritis    Chronic pain disorder    on buprenorphrine-naloxone    Cirrhosis (HCC)    a.) 10/04/2019 - Child Pugh A: 5 and MELD-Na score: 8   Depression    Dysrhythmia    Exertional dyspnea    GERD (gastroesophageal reflux disease)    food sticks in throat has  esophagus stretched   H/O bacterial pneumonia 11/11/2007   2008 and 2015    Heart murmur    Hepatic encephalopathy (HCC)    takes daily lactulose  and rifaximin    Hepatitis C    treatment naive genotype 3; Tx'd with 12 weeks Epclusa and Ribavirin   Hiatal hernia    History of substance abuse (HCC)    in remission; uses buprenorphrine-naloxone    Hx of endoscopy    05/12/2014- [facility]. Dr Janine Melbourne. Benign appearing stricture. Dilated the examination was otherwise normal.02/13/2012- Beniign appearing intrinsic mild stenosis at GE junction, successfully dialated   Hypertension    Hypothyroidism    On supplemental oxygen  by nasal cannula    2L PRN   Opioid dependence in early, early partial, sustained full, or sustained partial remission (HCC)    Peripheral neuropathy    Pneumonia    PTSD (post-traumatic stress disorder)    PUD (peptic ulcer disease)    Renal cyst, right  Sleep apnea    compliance issues with prescribed nocturnal BiPAP therapy   Splenomegaly    a.)  14.6 cm on 09/06/2019 MRI   T2DM (type 2 diabetes mellitus) (HCC)    Past Surgical History:  Procedure Laterality Date   COLONOSCOPY WITH PROPOFOL  N/A 05/12/2015   Procedure: COLONOSCOPY WITH PROPOFOL ;  Surgeon: Stephens Eis, MD;  Location: [facility] ENDOSCOPY;  Service: Gastroenterology;  Laterality: N/A;   edg     HERNIA REPAIR  08/2012   Dr. Lorel Roes; [facility]   INGUINAL HERNIA REPAIR Right 10/28/2017   Procedure: HERNIA REPAIR INGUINAL ADULT;  Surgeon: Benancio Bracket, MD;  Location: [facility] ORS;  Service: General;  Laterality: Right;   XI ROBOTIC ASSISTED INGUINAL HERNIA REPAIR WITH MESH Bilateral 05/14/2021   Procedure: XI ROBOTIC ASSISTED INGUINAL HERNIA REPAIR WITH MESH;  Surgeon: Mercy Stall, MD;  Location: [facility] ORS;  Service: General;  Laterality: Bilateral;   Family History  Problem Relation Age of Onset   Cancer Mother        Throat, lung, skin   COPD Mother    Arthritis Mother    Diabetes Father    Arthritis Father     Cancer Father        Bladder   Alcohol abuse Brother    Diabetes Brother    Alcohol abuse Brother    Drug abuse Brother    Cancer Paternal Grandfather    Cancer Paternal Uncle    Social History   Socioeconomic History   Marital status: Married    Spouse name: Levie Ream   Number of children: 4   Years of education: Not on file   Highest education level: Associate degree: academic program  Occupational History    Comment: retired  Tobacco Use   Smoking status: Never   Smokeless tobacco: Never  Vaping Use   Vaping status: Never Used  Substance and Sexual Activity   Alcohol use: No    Alcohol/week: 0.0 standard drinks of alcohol   Drug use: Not Currently    Types: Oxycodone    Sexual activity: Not Currently  Other Topics Concern   Not on file  Social History Narrative   Not on file   Social Drivers of Health   Financial Resource Strain: Low Risk  (03/31/2024)   Overall Financial Resource Strain (CARDIA)    Difficulty of Paying Living Expenses: Not very hard  Food Insecurity: No Food Insecurity (03/31/2024)   Hunger Vital Sign    Worried About Running Out of Food in the Last Year: Never true    Ran Out of Food in the Last Year: Never true  Transportation Needs: No Transportation Needs (03/31/2024)   PRAPARE - Administrator, Civil Service (Medical): No    Lack of Transportation (Non-Medical): No  Physical Activity: Insufficiently Active (03/31/2024)   Exercise Vital Sign    Days of Exercise per Week: 7 days    Minutes of Exercise per Session: 20 min  Stress: No Stress Concern Present (03/31/2024)   [employer] of Occupational Health - Occupational Stress Questionnaire    Feeling of Stress : Not at all  Social Connections: Moderately Integrated (03/31/2024)   Social Connection and Isolation Panel [NHANES]    Frequency of Communication with Friends and Family: More than three times a week    Frequency of Social Gatherings with Friends and Family: More than  three times a week    Attends Religious Services: More than 4 times per year    Active Member of  Clubs or Organizations: No    Attends Engineer, structural: Never    Marital Status: Married    Tobacco Counseling Counseling given: Not Answered    Clinical Intake:  Pre-visit preparation completed: Yes  Pain : 0-10 Pain Score: 4  Pain Type: Chronic pain Pain Location: Back Pain Orientation: Lower Pain Descriptors / Indicators: Aching, Discomfort, Constant Pain Onset: More than a month ago Pain Frequency: Constant     BMI - recorded: 32.1 Nutritional Status: BMI > 30  Obese Nutritional Risks: None Diabetes: Yes CBG done?: No Did pt. bring in CBG monitor from home?: No  Lab Results  Component Value Date   HGBA1C 6.6 (H) 07/25/2023   HGBA1C 6.9 (H) 09/09/2022   HGBA1C 5.9 (A) 02/16/2021     How often do you need to have someone help you when you read instructions, pamphlets, or other written materials from your doctor or pharmacy?: 1 - Never  Interpreter Needed?: No  Information entered by :: Dellie Fergusson, LPN   Activities of Daily Living    03/31/2024    1:12 PM  In your present state of health, do you have any difficulty performing the following activities:  Hearing? 0  Vision? 0  Difficulty concentrating or making decisions? 1  Walking or climbing stairs? 1  Dressing or bathing? 0  Doing errands, shopping? 0  Preparing Food and eating ? N  Using the Toilet? N  In the past six months, have you accidently leaked urine? N  Do you have problems with loss of bowel control? N  Managing your Medications? Y  Managing your Finances? Y  Housekeeping or managing your Housekeeping? Y    Patient Care Team: Lamon Pillow, MD as PCP - General (Family Medicine) Agustina Aldrich, MD as Consulting Physician (Neurosurgery) Cassie Click, MD (Inactive) (Gastroenterology) Chares Commons., MD (Rheumatology) Oh, Dora Gallo, MD (Inactive)  (Gastroenterology) Nelda Balsam, MD as Referring Physician (Gastroenterology) Jerlean Mood, MD as Consulting Physician (General Surgery) Lamon Pillow, MD as Referring Physician (Family Medicine) Pa, [facility] (Optometry)  Indicate any recent Medical Services you may have received from other than [facility] providers in the past year (date may be approximate).     Assessment:   This is a routine wellness examination for [place].  Hearing/Vision screen Hearing Screening - Comments:: NO AIDS Vision Screening - Comments:: WEARS GLASSES ALL DAY- [facility]- HAD EYE EXAM ONE WEEK AGO   Goals Addressed             This Visit's Progress    DIET - EAT MORE FRUITS AND VEGETABLES         Depression Screen     03/31/2024    1:08 PM 07/25/2023    3:52 PM 03/31/2023   11:26 AM 09/09/2022    2:39 PM 02/27/2022   11:08 AM 06/06/2021    2:13 PM 04/27/2021   10:48 AM  PHQ 2/9 Scores  PHQ - 2 Score 2 2 1 1 2  3   PHQ- 9 Score 3 12  9 9  12      Information is confidential and restricted. Go to Review Flowsheets to unlock data.    Fall Risk     03/31/2024    1:12 PM 07/25/2023    3:52 PM 03/31/2023   11:21 AM 09/09/2022    2:39 PM 02/27/2022   11:08 AM  Fall Risk   Falls in the past year? 0 0 1 0 0  Comment  dog pulled pt down:no injuries    Number falls in past yr: 0 0 0  0  Injury with Fall? 0 0 0  0  Risk for fall due to : No Fall Risks No Fall Risks No Fall Risks  No Fall Risks  Follow up Falls prevention discussed;Falls evaluation completed Falls evaluation completed Education provided;Falls prevention discussed  Falls evaluation completed    MEDICARE RISK AT HOME:  Medicare Risk at Home Any stairs in or around the home?: Yes If so, are there any without handrails?: No Home free of loose throw rugs in walkways, pet beds, electrical cords, etc?: Yes Adequate lighting in your home to reduce risk of falls?: Yes Life alert?: No Use of a cane, walker or w/c?:  No Grab bars in the bathroom?: No Shower chair or bench in shower?: Yes Elevated toilet seat or a handicapped toilet?: No  TIMED UP AND GO:  Was the test performed?  No  Cognitive Function: 6CIT completed        03/31/2024    1:15 PM 03/31/2023   11:35 AM  6CIT Screen  What Year? 0 points 0 points  What month? 0 points 0 points  What time? 0 points 0 points  Count back from 20 0 points 0 points  Months in reverse 0 points 0 points  Repeat phrase 2 points 0 points  Total Score 2 points 0 points    Immunizations Immunization History  Administered Date(s) Administered   Influenza,inj,Quad PF,6+ Mos 08/30/2015, 08/19/2016, 09/02/2017, 08/06/2018, 09/04/2022   Influenza-Unspecified 10/31/2019, 10/16/2020, 10/22/2021   Pneumococcal Polysaccharide-23 10/30/2009   Tdap 10/29/2011   Zoster Recombinant(Shingrix) 01/24/2020, 06/12/2020    Screening Tests Health Maintenance  Topic Date Due   COVID-19 Vaccine (1) Never done   FOOT EXAM  Never done   Pneumococcal Vaccine 70-42 Years old (2 of 2 - PCV) 10/30/2010   DTaP/Tdap/Td (2 - Td or Tdap) 10/28/2021   HEMOGLOBIN A1C  01/25/2024   INFLUENZA VACCINE  07/02/2024   Diabetic kidney evaluation - eGFR measurement  07/24/2024   Diabetic kidney evaluation - Urine ACR  07/24/2024   OPHTHALMOLOGY EXAM  09/16/2024   Medicare Annual Wellness (AWV)  03/31/2025   Colonoscopy  05/11/2025   Hepatitis C Screening  Completed   HIV Screening  Completed   Zoster Vaccines- Shingrix  Completed   HPV VACCINES  Aged Out   Meningococcal B Vaccine  Aged Out    Health Maintenance  Health Maintenance Due  Topic Date Due   COVID-19 Vaccine (1) Never done   FOOT EXAM  Never done   Pneumococcal Vaccine 25-65 Years old (2 of 2 - PCV) 10/30/2010   DTaP/Tdap/Td (2 - Td or Tdap) 10/28/2021   HEMOGLOBIN A1C  01/25/2024   Health Maintenance Items Addressed: DM LABS ORDERED; UP TO DATE ON EYE EXAM, COLONOSCOPY; NEED FOOT EXAM; NEEDS PNA & TDAP;  DECLINES COVID SHOTS  Additional Screening:  Vision Screening: Recommended annual ophthalmology exams for early detection of glaucoma and other disorders of the eye.  Dental Screening: Recommended annual dental exams for proper oral hygiene  Community Resource Referral / Chronic Care Management: CRR required this visit?  No   CCM required this visit?  No     Plan:     I have personally reviewed and noted the following in the patient's chart:   Medical and social history Use of alcohol, tobacco or illicit drugs  Current medications and supplements including opioid prescriptions. Patient is not currently taking opioid  prescriptions. Functional ability and status Nutritional status Physical activity Advanced directives List of other physicians Hospitalizations, surgeries, and ER visits in previous 12 months Vitals Screenings to include cognitive, depression, and falls Referrals and appointments  In addition, I have reviewed and discussed with patient certain preventive protocols, quality metrics, and best practice recommendations. A written personalized care plan for preventive services as well as general preventive health recommendations were provided to patient.     Pinky Bright, LPN   [id number]/61/0960   After Visit Summary: (MyChart) Due to this being a telephonic visit, the after visit summary with patients personalized plan was offered to patient via MyChart   Notes: DM LABS ORDERED FAXED EYE MD FOR RECORD OF EYE EXAM

## 2024-04-05 ENCOUNTER — Other Ambulatory Visit: Payer: Self-pay | Admitting: Family Medicine

## 2024-04-26 ENCOUNTER — Other Ambulatory Visit: Payer: Self-pay | Admitting: Family Medicine

## 2024-06-26 ENCOUNTER — Other Ambulatory Visit: Payer: Self-pay | Admitting: Family Medicine

## 2024-07-28 ENCOUNTER — Other Ambulatory Visit: Payer: Self-pay | Admitting: Family Medicine

## 2024-07-28 DIAGNOSIS — I10 Essential (primary) hypertension: Secondary | ICD-10-CM

## 2024-09-09 ENCOUNTER — Other Ambulatory Visit: Payer: Self-pay | Admitting: Family Medicine

## 2024-09-09 DIAGNOSIS — I10 Essential (primary) hypertension: Secondary | ICD-10-CM

## 2024-10-20 ENCOUNTER — Other Ambulatory Visit: Payer: Self-pay | Admitting: Family Medicine

## 2024-10-20 DIAGNOSIS — F3342 Major depressive disorder, recurrent, in full remission: Secondary | ICD-10-CM

## 2024-10-20 DIAGNOSIS — F5101 Primary insomnia: Secondary | ICD-10-CM

## 2025-01-05 ENCOUNTER — Other Ambulatory Visit: Payer: Self-pay | Admitting: Family Medicine

## 2025-04-06 ENCOUNTER — Ambulatory Visit
